# Patient Record
Sex: Female | Born: 1970 | Race: White | Hispanic: No | Marital: Married | Smoking: Former smoker
Health system: Southern US, Community
[De-identification: ages and names within clinical notes are randomized; demographics above are authoritative.]

## PROBLEM LIST (undated history)

## (undated) DIAGNOSIS — K219 Gastro-esophageal reflux disease without esophagitis: Secondary | ICD-10-CM

## (undated) DIAGNOSIS — M79673 Pain in unspecified foot: Secondary | ICD-10-CM

## (undated) DIAGNOSIS — D259 Leiomyoma of uterus, unspecified: Secondary | ICD-10-CM

## (undated) DIAGNOSIS — M545 Low back pain, unspecified: Secondary | ICD-10-CM

## (undated) DIAGNOSIS — E669 Obesity, unspecified: Secondary | ICD-10-CM

## (undated) DIAGNOSIS — J45909 Unspecified asthma, uncomplicated: Secondary | ICD-10-CM

## (undated) DIAGNOSIS — N946 Dysmenorrhea, unspecified: Secondary | ICD-10-CM

## (undated) DIAGNOSIS — R609 Edema, unspecified: Secondary | ICD-10-CM

## (undated) DIAGNOSIS — T4145XA Adverse effect of unspecified anesthetic, initial encounter: Secondary | ICD-10-CM

## (undated) DIAGNOSIS — I10 Essential (primary) hypertension: Secondary | ICD-10-CM

## (undated) DIAGNOSIS — E785 Hyperlipidemia, unspecified: Secondary | ICD-10-CM

## (undated) DIAGNOSIS — M1990 Unspecified osteoarthritis, unspecified site: Secondary | ICD-10-CM

## (undated) DIAGNOSIS — T7840XA Allergy, unspecified, initial encounter: Secondary | ICD-10-CM

## (undated) DIAGNOSIS — R251 Tremor, unspecified: Secondary | ICD-10-CM

## (undated) DIAGNOSIS — R519 Headache, unspecified: Secondary | ICD-10-CM

## (undated) DIAGNOSIS — G8929 Other chronic pain: Secondary | ICD-10-CM

## (undated) DIAGNOSIS — R51 Headache: Secondary | ICD-10-CM

## (undated) DIAGNOSIS — R7989 Other specified abnormal findings of blood chemistry: Secondary | ICD-10-CM

## (undated) DIAGNOSIS — L259 Unspecified contact dermatitis, unspecified cause: Secondary | ICD-10-CM

## (undated) DIAGNOSIS — N841 Polyp of cervix uteri: Secondary | ICD-10-CM

## (undated) DIAGNOSIS — N979 Female infertility, unspecified: Secondary | ICD-10-CM

## (undated) DIAGNOSIS — G629 Polyneuropathy, unspecified: Secondary | ICD-10-CM

## (undated) DIAGNOSIS — T8859XA Other complications of anesthesia, initial encounter: Secondary | ICD-10-CM

## (undated) DIAGNOSIS — E119 Type 2 diabetes mellitus without complications: Secondary | ICD-10-CM

## (undated) HISTORY — PX: OTHER SURGICAL HISTORY: SHX169

## (undated) HISTORY — DX: Leiomyoma of uterus, unspecified: D25.9

## (undated) HISTORY — DX: Other chronic pain: G89.29

## (undated) HISTORY — PX: OVARIAN CYST REMOVAL: SHX89

## (undated) HISTORY — DX: Other specified abnormal findings of blood chemistry: R79.89

## (undated) HISTORY — DX: Dysmenorrhea, unspecified: N94.6

## (undated) HISTORY — DX: Gastro-esophageal reflux disease without esophagitis: K21.9

## (undated) HISTORY — DX: Tremor, unspecified: R25.1

## (undated) HISTORY — DX: Hyperlipidemia, unspecified: E78.5

## (undated) HISTORY — DX: Obesity, unspecified: E66.9

## (undated) HISTORY — DX: Edema, unspecified: R60.9

## (undated) HISTORY — PX: APPENDECTOMY: SHX54

## (undated) HISTORY — DX: Polyp of cervix uteri: N84.1

## (undated) HISTORY — DX: Female infertility, unspecified: N97.9

## (undated) HISTORY — DX: Allergy, unspecified, initial encounter: T78.40XA

## (undated) HISTORY — DX: Low back pain, unspecified: M54.50

## (undated) HISTORY — PX: DILATION AND CURETTAGE OF UTERUS: SHX78

## (undated) HISTORY — PX: HYSTEROSCOPY: SHX211

## (undated) HISTORY — DX: Essential (primary) hypertension: I10

## (undated) HISTORY — DX: Unspecified contact dermatitis, unspecified cause: L25.9

## (undated) HISTORY — DX: Low back pain: M54.5

## (undated) HISTORY — DX: Pain in unspecified foot: M79.673

## (undated) HISTORY — PX: OOPHORECTOMY: SHX86

---

## 2004-01-26 ENCOUNTER — Ambulatory Visit: Payer: Self-pay

## 2005-04-02 ENCOUNTER — Ambulatory Visit: Payer: Self-pay | Admitting: Family Medicine

## 2005-04-11 ENCOUNTER — Ambulatory Visit: Payer: Self-pay | Admitting: Family Medicine

## 2007-12-04 ENCOUNTER — Ambulatory Visit: Payer: Self-pay | Admitting: Family Medicine

## 2008-04-16 ENCOUNTER — Ambulatory Visit: Payer: Self-pay | Admitting: Family Medicine

## 2010-10-13 ENCOUNTER — Ambulatory Visit: Payer: Self-pay | Admitting: Family Medicine

## 2010-10-20 ENCOUNTER — Ambulatory Visit: Payer: Self-pay

## 2012-11-20 ENCOUNTER — Ambulatory Visit: Payer: Self-pay | Admitting: Obstetrics and Gynecology

## 2012-11-20 LAB — COMPREHENSIVE METABOLIC PANEL
Albumin: 3.6 g/dL (ref 3.4–5.0)
Anion Gap: 5 — ABNORMAL LOW (ref 7–16)
BUN: 10 mg/dL (ref 7–18)
Co2: 26 mmol/L (ref 21–32)
Creatinine: 0.74 mg/dL (ref 0.60–1.30)
EGFR (African American): >60
EGFR (Non-African Amer.): >60
Potassium: 3.8 mmol/L (ref 3.5–5.1)
SGOT(AST): 20 U/L (ref 15–37)
Total Protein: 7.3 g/dL (ref 6.4–8.2)

## 2012-11-20 LAB — CBC
MCH: 25.9 pg — ABNORMAL LOW (ref 26.0–34.0)
MCHC: 33.8 g/dL (ref 32.0–36.0)
MCV: 77 fL — ABNORMAL LOW (ref 80–100)
Platelet: 291 10*3/uL (ref 150–440)
RBC: 4.99 10*6/uL (ref 3.80–5.20)
RDW: 14.6 % — ABNORMAL HIGH (ref 11.5–14.5)

## 2012-12-04 ENCOUNTER — Ambulatory Visit: Payer: Self-pay | Admitting: Obstetrics and Gynecology

## 2012-12-08 LAB — PATHOLOGY REPORT

## 2013-09-01 ENCOUNTER — Emergency Department: Payer: Self-pay | Admitting: Emergency Medicine

## 2013-09-01 LAB — CBC WITH DIFFERENTIAL/PLATELET
Basophil #: 0.1 10*3/uL (ref 0.0–0.1)
Basophil %: 0.7 %
Eosinophil #: 0.1 10*3/uL (ref 0.0–0.7)
Eosinophil %: 1.3 %
HCT: 38.9 % (ref 35.0–47.0)
HGB: 12.7 g/dL (ref 12.0–16.0)
LYMPHS ABS: 2.3 10*3/uL (ref 1.0–3.6)
Lymphocyte %: 25.2 %
MCH: 24.7 pg — ABNORMAL LOW (ref 26.0–34.0)
MCHC: 32.7 g/dL (ref 32.0–36.0)
MCV: 76 fL — ABNORMAL LOW (ref 80–100)
Monocyte #: 0.5 x10 3/mm (ref 0.2–0.9)
Monocyte %: 5.3 %
NEUTROS PCT: 67.5 %
Neutrophil #: 6.1 10*3/uL (ref 1.4–6.5)
Platelet: 299 10*3/uL (ref 150–440)
RBC: 5.16 10*6/uL (ref 3.80–5.20)
RDW: 14.8 % — AB (ref 11.5–14.5)
WBC: 9 10*3/uL (ref 3.6–11.0)

## 2013-09-01 LAB — TROPONIN I

## 2013-09-01 LAB — BASIC METABOLIC PANEL
ANION GAP: 9 (ref 7–16)
BUN: 11 mg/dL (ref 7–18)
CO2: 23 mmol/L (ref 21–32)
CREATININE: 1.01 mg/dL (ref 0.60–1.30)
Calcium, Total: 8.9 mg/dL (ref 8.5–10.1)
Chloride: 105 mmol/L (ref 98–107)
EGFR (African American): >60
EGFR (Non-African Amer.): >60
Glucose: 142 mg/dL — ABNORMAL HIGH (ref 65–99)
Osmolality: 276 (ref 275–301)
Potassium: 3.9 mmol/L (ref 3.5–5.1)
SODIUM: 137 mmol/L (ref 136–145)

## 2013-09-01 LAB — URINALYSIS, COMPLETE
BACTERIA: NONE SEEN
Bilirubin,UR: NEGATIVE
Glucose,UR: NEGATIVE mg/dL (ref 0–75)
Ketone: NEGATIVE
Leukocyte Esterase: NEGATIVE
NITRITE: NEGATIVE
PH: 5 (ref 4.5–8.0)
Protein: NEGATIVE
Specific Gravity: 1.025 (ref 1.003–1.030)
WBC UR: 3 /HPF (ref 0–5)

## 2013-11-25 LAB — HEMOGLOBIN A1C: HEMOGLOBIN A1C: 5.7 % (ref 4.0–6.0)

## 2013-12-01 ENCOUNTER — Emergency Department: Payer: Self-pay | Admitting: Internal Medicine

## 2013-12-01 LAB — COMPREHENSIVE METABOLIC PANEL
ALBUMIN: 3.7 g/dL (ref 3.4–5.0)
ALK PHOS: 70 U/L
ALT: 22 U/L
ANION GAP: 9 (ref 7–16)
AST: 21 U/L (ref 15–37)
BUN: 8 mg/dL (ref 7–18)
Bilirubin,Total: 0.4 mg/dL (ref 0.2–1.0)
Calcium, Total: 8.7 mg/dL (ref 8.5–10.1)
Chloride: 102 mmol/L (ref 98–107)
Co2: 26 mmol/L (ref 21–32)
Creatinine: 0.82 mg/dL (ref 0.60–1.30)
EGFR (African American): >60
EGFR (Non-African Amer.): >60
GLUCOSE: 100 mg/dL — AB (ref 65–99)
Osmolality: 272 (ref 275–301)
Potassium: 3.7 mmol/L (ref 3.5–5.1)
Sodium: 137 mmol/L (ref 136–145)
Total Protein: 7.6 g/dL (ref 6.4–8.2)

## 2013-12-01 LAB — URINALYSIS, COMPLETE
BLOOD: NEGATIVE
Bilirubin,UR: NEGATIVE
GLUCOSE, UR: NEGATIVE mg/dL (ref 0–75)
Ketone: NEGATIVE
Leukocyte Esterase: NEGATIVE
NITRITE: NEGATIVE
PROTEIN: NEGATIVE
Ph: 6 (ref 4.5–8.0)
RBC, UR: NONE SEEN /HPF (ref 0–5)
Specific Gravity: 1.004 (ref 1.003–1.030)
Squamous Epithelial: 1
WBC UR: <1 /HPF (ref 0–5)

## 2013-12-01 LAB — CBC WITH DIFFERENTIAL/PLATELET
BASOS ABS: 0.1 10*3/uL (ref 0.0–0.1)
Basophil %: 1.2 %
EOS ABS: 0.1 10*3/uL (ref 0.0–0.7)
Eosinophil %: 0.8 %
HCT: 39.7 % (ref 35.0–47.0)
HGB: 13 g/dL (ref 12.0–16.0)
LYMPHS PCT: 22.6 %
Lymphocyte #: 2.3 10*3/uL (ref 1.0–3.6)
MCH: 24.8 pg — ABNORMAL LOW (ref 26.0–34.0)
MCHC: 32.8 g/dL (ref 32.0–36.0)
MCV: 76 fL — AB (ref 80–100)
Monocyte #: 0.6 x10 3/mm (ref 0.2–0.9)
Monocyte %: 5.9 %
Neutrophil #: 7 10*3/uL — ABNORMAL HIGH (ref 1.4–6.5)
Neutrophil %: 69.5 %
Platelet: 299 10*3/uL (ref 150–440)
RBC: 5.25 10*6/uL — ABNORMAL HIGH (ref 3.80–5.20)
RDW: 14.6 % — AB (ref 11.5–14.5)
WBC: 10 10*3/uL (ref 3.6–11.0)

## 2013-12-01 LAB — LIPASE, BLOOD: LIPASE: 123 U/L (ref 73–393)

## 2014-04-15 LAB — HM PAP SMEAR: HM Pap smear: NORMAL

## 2014-06-25 NOTE — Op Note (Signed)
PATIENT NAME:  Kelsey Alvarez, Kelsey Alvarez MR#:  [id number] DATE OF BIRTH:  07-Dec-1970  DATE OF PROCEDURE:  12/04/2012  PREOPERATIVE DIAGNOSES: 1.  Abnormal uterine bleeding.  2.  Suspected polyp.   POSTOPERATIVE DIAGNOSES: 1.  Abnormal uterine bleeding.  2.  Suspected polyp.   PROCEDURES: 1.  Dilation and curettage.  2.  Hysteroscopy.  3.  Polypectomy.   ANESTHESIA: General, MAC.   SURGEON: Prentice Docker, M.D.   ESTIMATED BLOOD LOSS: 25 mL.   OPERATIVE FLUIDS: 1500 mL crystalloid.   COMPLICATIONS: None.   FINDINGS: 1.  Shaggy endometrium.  2.  Several polypoid lesions.   SPECIMENS: Endometrial curettings, with suspected polyps.   CONDITION: Stable at the end of the procedure.   PROCEDURE IN DETAIL: The patient was taken to the operating room where general MAC anesthesia was administered and found to be adequate. The patient was placed in the dorsal supine high-lithotomy position and prepped and draped in the usual sterile fashion. After a time-out was called, a red rubber catheter was placed through the catheter for in-and-out straight catheterization for return of approximately 50 mL clear urine. A sterile speculum was placed in the vagina and the posterior lip of the cervix was grasped with a single-toothed tenaculum, given her retroverted uterus. The uterus was then sounded to a depth of approximately 9 cm. The cervix was then dilated gently in a serial fashion using Hagar dilators to a dilatation of 9 mm.   Next, the hysteroscope was gently introduced through the cervix, with the above-noted findings. The hysteroscope was removed and gentle curettage was performed until a gritty texture was noted throughout. The hysteroscope was reintroduced. There was a single polypoid lesion remaining. The Randall stone forceps were then introduced through the cervix gently to attempt to remove the polyp and the lesion, and this was followed by the curette with a single pass in a 360-degree manner  around the uterus, including the fundus. The camera was reintroduced, and there was a minimal amount of shaggy features of the uterus at this point, and all polypoid lesions had been removed. At this point the procedure was terminated. The scope was gently removed. The tenaculum was removed and hemostasis was noted on the posterior lip of the cervix. All instrumentation from the vagina was removed after verification that the vagina was free of instruments or sponges.   The patient tolerated the procedure well. All instruments counts were correct x 2. For VTE prophylaxis, the patient was wearing pneumatic compression stockings throughout the entire procedure. She was awakened in the operating room and taken to the recovery area in stable condition.     ____________________________ Will Bonnet, MD sdj:dm D: 12/04/2012 14:54:10 ET T: 12/04/2012 15:55:15 ET JOB#: [id number]  cc: Will Bonnet, MD, <Dictator> Will Bonnet MD ELECTRONICALLY SIGNED 12/23/2012 15:16

## 2014-11-16 ENCOUNTER — Encounter: Payer: Self-pay | Admitting: *Deleted

## 2014-11-16 ENCOUNTER — Emergency Department: Payer: Managed Care, Other (non HMO)

## 2014-11-16 DIAGNOSIS — S93402A Sprain of unspecified ligament of left ankle, initial encounter: Secondary | ICD-10-CM | POA: Diagnosis not present

## 2014-11-16 DIAGNOSIS — Y998 Other external cause status: Secondary | ICD-10-CM | POA: Insufficient documentation

## 2014-11-16 DIAGNOSIS — X58XXXA Exposure to other specified factors, initial encounter: Secondary | ICD-10-CM | POA: Insufficient documentation

## 2014-11-16 DIAGNOSIS — Z880 Allergy status to penicillin: Secondary | ICD-10-CM | POA: Diagnosis not present

## 2014-11-16 DIAGNOSIS — E119 Type 2 diabetes mellitus without complications: Secondary | ICD-10-CM | POA: Insufficient documentation

## 2014-11-16 DIAGNOSIS — Y9289 Other specified places as the place of occurrence of the external cause: Secondary | ICD-10-CM | POA: Diagnosis not present

## 2014-11-16 DIAGNOSIS — Y9389 Activity, other specified: Secondary | ICD-10-CM | POA: Insufficient documentation

## 2014-11-16 DIAGNOSIS — S99912A Unspecified injury of left ankle, initial encounter: Secondary | ICD-10-CM | POA: Diagnosis present

## 2014-11-16 NOTE — ED Notes (Signed)
Pt states that she stepped down out of a truck over the weekend and has been having pain and swelling in her left ankle

## 2014-11-17 ENCOUNTER — Emergency Department
Admission: EM | Admit: 2014-11-17 | Discharge: 2014-11-17 | Disposition: A | Discharge status: Home / Self Care | Payer: Managed Care, Other (non HMO) | Attending: Emergency Medicine | Admitting: Emergency Medicine

## 2014-11-17 DIAGNOSIS — S93402A Sprain of unspecified ligament of left ankle, initial encounter: Secondary | ICD-10-CM

## 2014-11-17 HISTORY — DX: Type 2 diabetes mellitus without complications: E11.9

## 2014-11-17 HISTORY — DX: Unspecified asthma, uncomplicated: J45.909

## 2014-11-17 MED ORDER — OXYCODONE-ACETAMINOPHEN 5-325 MG PO TABS: 1.0000 | ORAL_TABLET | Freq: Once | ORAL | Status: DC

## 2014-11-17 MED ORDER — OXYCODONE-ACETAMINOPHEN 5-325 MG PO TABS
ORAL_TABLET | ORAL | Status: AC
  Administered 2014-11-17: 1 via ORAL
  Filled 2014-11-17: qty 1

## 2014-11-17 MED ORDER — OXYCODONE-ACETAMINOPHEN 5-325 MG PO TABS
1.0000 | ORAL_TABLET | Freq: Once | ORAL | Status: AC
  Administered 2014-11-17: 1 via ORAL

## 2014-11-17 NOTE — Discharge Instructions (Signed)

## 2014-11-17 NOTE — ED Provider Notes (Signed)
[facility] Emergency Department Provider Note  ____________________________________________  Time seen: 12:10 AM  I have reviewed the triage vital signs and the nursing notes.   HISTORY  Chief Complaint Ankle Pain      HPI Kelsey Alvarez is a 44 y.o. female presents with history of injuring her left ankle 2 days ago when she stepped out of a truck patient said she rolled her ankle and has had pain and swelling in since that time. Patient states current pain score is 8 out of 10 located primarily around the lateral worsening of the left ankle. Patient states pain is aggravated with movement and ambulation. Patient states that pain is alleviated with ice and rest and elevation.    Past Medical History  Diagnosis Date  . Diabetes mellitus without complication   . Asthma     There are no active problems to display for this patient.   No past surgical history on file.  No current outpatient prescriptions on file.  Allergies Penicillins  No family history on file.  Social History Social History  Substance Use Topics  . Smoking status: Never Smoker   . Smokeless tobacco: None  . Alcohol Use: No    Review of Systems  Constitutional: Negative for fever. Eyes: Negative for visual changes. ENT: Negative for sore throat. Cardiovascular: Negative for chest pain. Respiratory: Negative for shortness of breath. Gastrointestinal: Negative for abdominal pain, vomiting and diarrhea. Genitourinary: Negative for dysuria. Musculoskeletal: Negative for back pain.Positive for left ankle pain Skin: Negative for rash. Neurological: Negative for headaches, focal weakness or numbness.   10-point ROS otherwise negative.  ____________________________________________   PHYSICAL EXAM:  VITAL SIGNS: ED Triage Vitals  Enc Vitals Group     BP 11/16/14 2114 171/97 mmHg     Pulse Rate 11/16/14 2114 71     Resp 11/16/14 2114 16     Temp 11/16/14 2114 98.2  F (36.8 C)     Temp Source 11/16/14 2114 Oral     SpO2 11/16/14 2114 100 %     Weight 11/16/14 2114 243 lb (110.224 kg)     Height 11/16/14 2114 5\' 1"  (1.549 m)     Head Cir --      Peak Flow --      Pain Score 11/16/14 2115 7     Pain Loc --      Pain Edu? --      Excl. in [place]? --      Constitutional: Alert and oriented. Well appearing and in no distress. Eyes: Conjunctivae are normal. PERRL. Normal extraocular movements. ENT   Head: Normocephalic and atraumatic.   Nose: No congestion/rhinnorhea.   Mouth/Throat: Mucous membranes are moist.   Neck: No stridor. Musculoskeletal: Nontender with normal range of motion in all extremities. No joint effusions. Pain and swelling left lateral malleoli Skin:  Skin is warm, dry and intact. No rash noted. Psychiatric: Mood and affect are normal. Speech and behavior are normal. Patient exhibits appropriate insight and judgment.      RADIOLOGY DG Ankle Complete Left (Final result) Result time: 11/16/14 21:56:31   Final result by Rad Results In Interface (11/16/14 21:56:31)   Narrative:   CLINICAL DATA: Left ankle pain and swelling. Stepped down out of a truck over the weekend, pain and swelling since that time.  EXAM: LEFT ANKLE COMPLETE - 3+ VIEW  COMPARISON: None.  FINDINGS: No fracture or dislocation. The alignment and joint spaces are maintained. The ankle mortise is preserved. Prominent plantar calcaneal  spur and Achilles tendon enthesophyte. No localizing soft tissue abnormality or radiopaque foreign body.  IMPRESSION: No fracture or dislocation of the left ankle.   Electronically Signed By: Jeb Levering M.D. On: 11/16/2014 21:56          INITIAL IMPRESSION / ASSESSMENT AND PLAN / ED COURSE  Pertinent labs & imaging results that were available during my care of the patient were reviewed by me and considered in my medical decision making (see chart for details).  Velcro splint  applied, crutches given  ____________________________________________   FINAL CLINICAL IMPRESSION(S) / ED DIAGNOSES  Final diagnoses:  None      Gregor Hams, MD 11/17/14 [phone]

## 2014-11-26 ENCOUNTER — Other Ambulatory Visit: Payer: Self-pay

## 2014-11-26 NOTE — Telephone Encounter (Signed)
Patient requesting refill. 

## 2014-11-30 ENCOUNTER — Other Ambulatory Visit: Payer: Self-pay

## 2014-12-03 ENCOUNTER — Other Ambulatory Visit: Payer: Self-pay

## 2014-12-06 ENCOUNTER — Telehealth: Payer: Self-pay

## 2014-12-06 NOTE — Telephone Encounter (Signed)
Patient needs an appointment before she can refill medication

## 2014-12-06 NOTE — Telephone Encounter (Signed)
LVM for patient to return call. 

## 2014-12-09 ENCOUNTER — Other Ambulatory Visit: Payer: Self-pay | Admitting: Family Medicine

## 2014-12-10 ENCOUNTER — Other Ambulatory Visit: Payer: Self-pay

## 2014-12-10 NOTE — Telephone Encounter (Signed)
Refill meds

## 2014-12-14 ENCOUNTER — Telehealth: Payer: Self-pay

## 2014-12-14 NOTE — Telephone Encounter (Signed)
Left voice message to schedule appointment.

## 2014-12-14 NOTE — Telephone Encounter (Signed)
Pt needs appointment before meds can be refilled

## 2014-12-20 ENCOUNTER — Other Ambulatory Visit: Payer: Self-pay

## 2014-12-20 NOTE — Telephone Encounter (Signed)
Has an appt on 10/25

## 2014-12-20 NOTE — Telephone Encounter (Signed)
How many times day?

## 2014-12-21 ENCOUNTER — Other Ambulatory Visit: Payer: Self-pay

## 2014-12-21 MED ORDER — LABETALOL HCL 200 MG PO TABS: 200.0000 mg | ORAL_TABLET | Freq: Two times a day (BID) | ORAL | Status: DC

## 2014-12-28 ENCOUNTER — Ambulatory Visit (INDEPENDENT_AMBULATORY_CARE_PROVIDER_SITE_OTHER): Payer: Managed Care, Other (non HMO) | Admitting: Family Medicine

## 2014-12-28 ENCOUNTER — Encounter: Payer: Self-pay | Admitting: Family Medicine

## 2014-12-28 ENCOUNTER — Encounter (INDEPENDENT_AMBULATORY_CARE_PROVIDER_SITE_OTHER): Payer: Self-pay

## 2014-12-28 VITALS — BP 124/84 | HR 72 | Temp 98.0°F | Resp 16 | Ht 62.0 in | Wt 258.0 lb

## 2014-12-28 DIAGNOSIS — R609 Edema, unspecified: Secondary | ICD-10-CM | POA: Insufficient documentation

## 2014-12-28 DIAGNOSIS — E119 Type 2 diabetes mellitus without complications: Secondary | ICD-10-CM

## 2014-12-28 DIAGNOSIS — M545 Low back pain, unspecified: Secondary | ICD-10-CM | POA: Insufficient documentation

## 2014-12-28 DIAGNOSIS — J3089 Other allergic rhinitis: Secondary | ICD-10-CM

## 2014-12-28 DIAGNOSIS — E559 Vitamin D deficiency, unspecified: Secondary | ICD-10-CM | POA: Insufficient documentation

## 2014-12-28 DIAGNOSIS — J309 Allergic rhinitis, unspecified: Secondary | ICD-10-CM

## 2014-12-28 DIAGNOSIS — N979 Female infertility, unspecified: Secondary | ICD-10-CM | POA: Insufficient documentation

## 2014-12-28 DIAGNOSIS — Z23 Encounter for immunization: Secondary | ICD-10-CM | POA: Diagnosis not present

## 2014-12-28 DIAGNOSIS — M5432 Sciatica, left side: Secondary | ICD-10-CM | POA: Insufficient documentation

## 2014-12-28 DIAGNOSIS — M79605 Pain in left leg: Secondary | ICD-10-CM

## 2014-12-28 DIAGNOSIS — I10 Essential (primary) hypertension: Secondary | ICD-10-CM | POA: Diagnosis not present

## 2014-12-28 DIAGNOSIS — N946 Dysmenorrhea, unspecified: Secondary | ICD-10-CM | POA: Insufficient documentation

## 2014-12-28 DIAGNOSIS — E785 Hyperlipidemia, unspecified: Secondary | ICD-10-CM | POA: Diagnosis not present

## 2014-12-28 DIAGNOSIS — R251 Tremor, unspecified: Secondary | ICD-10-CM | POA: Insufficient documentation

## 2014-12-28 DIAGNOSIS — J4540 Moderate persistent asthma, uncomplicated: Secondary | ICD-10-CM | POA: Diagnosis not present

## 2014-12-28 DIAGNOSIS — N841 Polyp of cervix uteri: Secondary | ICD-10-CM | POA: Insufficient documentation

## 2014-12-28 DIAGNOSIS — E1129 Type 2 diabetes mellitus with other diabetic kidney complication: Secondary | ICD-10-CM | POA: Insufficient documentation

## 2014-12-28 DIAGNOSIS — N921 Excessive and frequent menstruation with irregular cycle: Secondary | ICD-10-CM | POA: Insufficient documentation

## 2014-12-28 DIAGNOSIS — K219 Gastro-esophageal reflux disease without esophagitis: Secondary | ICD-10-CM | POA: Insufficient documentation

## 2014-12-28 DIAGNOSIS — R809 Proteinuria, unspecified: Secondary | ICD-10-CM

## 2014-12-28 MED ORDER — METFORMIN HCL 500 MG PO TABS: 500.0000 mg | ORAL_TABLET | Freq: Every evening | ORAL | Status: DC

## 2014-12-28 MED ORDER — HYDROCHLOROTHIAZIDE 25 MG PO TABS: 25.0000 mg | ORAL_TABLET | Freq: Every day | ORAL | Status: DC

## 2014-12-28 MED ORDER — LABETALOL HCL 200 MG PO TABS: 200.0000 mg | ORAL_TABLET | Freq: Two times a day (BID) | ORAL | Status: DC

## 2014-12-28 MED ORDER — MONTELUKAST SODIUM 10 MG PO TABS: 10.0000 mg | ORAL_TABLET | Freq: Every day | ORAL | Status: DC

## 2014-12-28 MED ORDER — PREDNISONE 10 MG (48) PO TBPK: ORAL_TABLET | Freq: Every day | ORAL | Status: DC

## 2014-12-28 MED ORDER — BUDESONIDE-FORMOTEROL FUMARATE 160-4.5 MCG/ACT IN AERO: 2.0000 | INHALATION_SPRAY | Freq: Two times a day (BID) | RESPIRATORY_TRACT | Status: DC

## 2014-12-28 MED ORDER — ALBUTEROL SULFATE HFA 108 (90 BASE) MCG/ACT IN AERS: 2.0000 | INHALATION_SPRAY | RESPIRATORY_TRACT | Status: DC | PRN

## 2014-12-28 MED ORDER — FLUTICASONE PROPIONATE 50 MCG/ACT NA SUSP: 2.0000 | Freq: Every day | NASAL | Status: DC

## 2014-12-28 NOTE — Progress Notes (Signed)
   12/28/14 1056  Asthma History  Symptoms Daily  Nighttime Awakenings Often--7/wk  Asthma interference with normal activity Some limitations  SABA use (not for EIB) > 2 days/wk--not > 1 x/day  Risk: Exacerbations requiring oral systemic steroids 0-1 / year  Asthma Severity Moderate Persistent

## 2014-12-28 NOTE — Progress Notes (Signed)
Name: Kelsey Alvarez   MRN: [id number]    DOB: 1970-10-31   Date:12/28/2014       Progress Note  Subjective  Chief Complaint  Chief Complaint  Patient presents with  . Medication Refill    follow-up  . Hypertension    a little dizziness, but has also had vertigo  . Asthma    chest tightness  . Diabetes    checks BG 2x weekly low-96, high-190's    HPI  HTN: she has been taking Labetolol and HCTZ ( sometimes skips when working because of urinary frequency ) She gets a little dizzy when she gets up quickly. Advised to stay hydrated and get up slowly. Occasionally has palpitation, occasional right side chest pain  Asthma:   12/28/14 1056  Asthma History  Symptoms Daily  Nighttime Awakenings Often--7/wk  Asthma interference with normal activity Some limitations  SABA use (not for EIB) > 2 days/wk--not > 1 x/day  Risk: Exacerbations requiring oral systemic steroids 0-1 / year  Asthma Severity Moderate Persistent   Only on Singulair and prn Albuterol, symptoms are not controlled and we will add Symbicort  Hyperlipidemia: off medication, states medications caused a headache in the past  Morbid Obesity: still gaining weight, still drinking sodas but is cutting down, also eating more grilled meals. Making some dieatery  Modification  Left Leg pain: she states that she developed left leg pain about 7 months ago, without any injury. States pain goes from buttocks down the entire left leg, no rashes, feels like a throbbing, aching pain, sometimes shooting, sometimes causes weakness. Seen by [facility] and diagnosed with sciatica, and given pain medication. She states stretching seems to help  DM: glucose at home has been at goal - this am at 121 , no polyphagia, polyuria or polydipsia, due for labs and eye exam. Taking Metformin and denies side effects.   Patient Active Problem List   Diagnosis Date Noted  . Benign essential HTN 12/28/2014  . Cervical polyp 12/28/2014  . Controlled type 2  diabetes mellitus without complication ([place]) 78/29/5621  . Dyslipidemia 12/28/2014  . Dysmenorrhea 12/28/2014  . Edema 12/28/2014  . Female infertility 12/28/2014  . Gastro-esophageal reflux disease without esophagitis 12/28/2014  . Morbid obesity ([place]) 12/28/2014  . Asthma, moderate persistent, poorly-controlled 12/28/2014  . Perennial allergic rhinitis 12/28/2014  . Intermittent tremor 12/28/2014  . Vitamin D deficiency 12/28/2014  . LBP (low back pain) 12/28/2014  . Left sciatic nerve pain 12/28/2014    Past Surgical History  Procedure Laterality Date  . Appendectomy    . Cervical polyp removal    . Ovarian cyst removal    . Hysteroscopy    . Dilation and curettage of uterus      Family History  Problem Relation Age of Onset  . Hypertension Mother   . Diabetes Mother   . CAD Mother   . Cancer Mother     cervical  . CAD Father   . Hypertension Father   . Arthritis Father     Social History   Social History  . Marital Status: Married    Spouse Name: N/A  . Number of Children: N/A  . Years of Education: N/A   Occupational History  . Not on file.   Social History Main Topics  . Smoking status: Former Smoker -- .5 years    Quit date: 12/28/2002  . Smokeless tobacco: Never Used  . Alcohol Use: 0.0 oz/week    0 Standard drinks or equivalent per week  .  Drug Use: No  . Sexual Activity: Yes   Other Topics Concern  . Not on file   Social History Narrative     Current outpatient prescriptions:  .  albuterol (ACCUNEB) 0.63 MG/3ML nebulizer solution, Inhale 1 mL into the lungs as needed., Disp: , Rfl:  .  albuterol (VENTOLIN HFA) 108 (90 BASE) MCG/ACT inhaler, Inhale 2 puffs into the lungs every 4 (four) hours as needed., Disp: 1 Inhaler, Rfl: 1 .  Cholecalciferol (VITAMIN D) 2000 UNITS tablet, Take 1 tablet by mouth daily., Disp: , Rfl:  .  fluticasone (FLONASE) 50 MCG/ACT nasal spray, Place 2 sprays into both nostrils daily., Disp: 16 g, Rfl: 5 .   budesonide-formoterol (SYMBICORT) 160-4.5 MCG/ACT inhaler, Inhale 2 puffs into the lungs 2 (two) times daily., Disp: 1 Inhaler, Rfl: 2 .  hydrochlorothiazide (HYDRODIURIL) 25 MG tablet, Take 1 tablet (25 mg total) by mouth daily., Disp: 90 tablet, Rfl: 1 .  labetalol (NORMODYNE) 200 MG tablet, Take 1 tablet (200 mg total) by mouth 2 (two) times daily., Disp: 180 tablet, Rfl: 1 .  metFORMIN (GLUCOPHAGE) 500 MG tablet, Take 1 tablet (500 mg total) by mouth every evening., Disp: 90 tablet, Rfl: 1 .  montelukast (SINGULAIR) 10 MG tablet, Take 1 tablet (10 mg total) by mouth at bedtime., Disp: 90 tablet, Rfl: 1 .  predniSONE (STERAPRED UNI-PAK 48 TAB) 10 MG (48) TBPK tablet, Take by mouth daily. Prednisone 12 day taper, Disp: 48 tablet, Rfl: 0 .  traMADol (ULTRAM) 50 MG tablet, , Disp: , Rfl:   Allergies  Allergen Reactions  . Penicillins Rash     ROS  Constitutional: Negative for fever with some weight change.  Respiratory: Positive  for cough and shortness of breath.   Cardiovascular: Negative for chest pain or palpitations.  Gastrointestinal: Negative for abdominal pain, no bowel changes.  Musculoskeletal: Positive for gait problem and some left knee  joint swelling.  Skin: Negative for rash.  Neurological: Positive  for dizziness but no  headache.  No other specific complaints in a complete review of systems (except as listed in HPI above).  Objective  Filed Vitals:   12/28/14 1010  BP: 124/84  Pulse: 72  Temp: 98 F (36.7 C)  TempSrc: Oral  Resp: 16  Height: 5\' 2"  (1.575 m)  Weight: 258 lb (117.028 kg)  SpO2: 97%    Body mass index is 47.18 kg/(m^2).  Physical Exam  Constitutional: Patient appears well-developed and well-nourished. Obese No distress.  HEENT: head atraumatic, normocephalic, pupils equal and reactive to light, neck supple, throat within normal limits Cardiovascular: Normal rate, regular rhythm and normal heart sounds.  No murmur heard. No BLE  edema. Pulmonary/Chest: Effort normal and breath sounds normal. No respiratory distress. Abdominal: Soft.  There is no tenderness. Psychiatric: Patient has a normal mood and affect. behavior is normal. Judgment and thought content normal. Muscular Skeletal: left low back pain during palpation, negative straight leg raise, pain with extension is on anterior knee, positive pain with abduction of left hip.   No results found for this or any previous visit (from the past 2160 hour(s)).  Diabetic Foot Exam: Diabetic Foot Exam - Simple   Simple Foot Form  Visual Inspection  No deformities, no ulcerations, no other skin breakdown bilaterally:  Yes  Sensation Testing  Intact to touch and monofilament testing bilaterally:  Yes  Pulse Check  Posterior Tibialis and Dorsalis pulse intact bilaterally:  Yes  Comments      PHQ2/9: Depression screen PHQ  2/9 12/28/2014  Decreased Interest 0  Down, Depressed, Hopeless 0  PHQ - 2 Score 0    Fall Risk: Fall Risk  12/28/2014  Falls in the past year? No    Functional Status Survey: Is the patient deaf or have difficulty hearing?: No Does the patient have difficulty seeing, even when wearing glasses/contacts?: Yes (glasses) Does the patient have difficulty concentrating, remembering, or making decisions?: No Does the patient have difficulty walking or climbing stairs?: No Does the patient have difficulty dressing or bathing?: No Does the patient have difficulty doing errands alone such as visiting a doctor's office or shopping?: No    Assessment & Plan  1. Type 2 diabetes mellitus without complication, without long-term current use of insulin (HCC)  Glucose will go up with prednisone, needs to follow diabetic diet and continue Metformin - POCT UA - Microalbumin - metFORMIN (GLUCOPHAGE) 500 MG tablet; Take 1 tablet (500 mg total) by mouth every evening.  Dispense: 90 tablet; Refill: 1 - Hemoglobin A1c  2. Needs flu shot  - Flu Vaccine  QUAD 36+ mos PF IM (Fluarix & Fluzone Quad PF)  3. Morbid obesity due to excess calories [facility])  Discussed with the patient the risk posed by an increased BMI. Discussed importance of portion control, calorie counting and at least 150 minutes of physical activity weekly. Avoid sweet beverages and drink more water. Eat at least 6 servings of fruit and vegetables daily   4. Dyslipidemia  Off medication, causes headache - Lipid panel  5. Uncontrolled moderate persistent asthma  Resume Symbicort, daily symptoms - montelukast (SINGULAIR) 10 MG tablet; Take 1 tablet (10 mg total) by mouth at bedtime.  Dispense: 90 tablet; Refill: 1 - albuterol (VENTOLIN HFA) 108 (90 BASE) MCG/ACT inhaler; Inhale 2 puffs into the lungs every 4 (four) hours as needed.  Dispense: 1 Inhaler; Refill: 1 - budesonide-formoterol (SYMBICORT) 160-4.5 MCG/ACT inhaler; Inhale 2 puffs into the lungs 2 (two) times daily.  Dispense: 1 Inhaler; Refill: 2  6. Benign essential HTN  - labetalol (NORMODYNE) 200 MG tablet; Take 1 tablet (200 mg total) by mouth 2 (two) times daily.  Dispense: 180 tablet; Refill: 1 - hydrochlorothiazide (HYDRODIURIL) 25 MG tablet; Take 1 tablet (25 mg total) by mouth daily.  Dispense: 90 tablet; Refill: 1 - Comprehensive metabolic panel  7. Leg pain, left  Differential diagnosis sciatica, hip OA, we will try prednisone and if no improvement referral to Ortho - predniSONE (STERAPRED UNI-PAK 48 TAB) 10 MG (48) TBPK tablet; Take by mouth daily. Prednisone 12 day taper  Dispense: 48 tablet; Refill: 0  8. Perennial allergic rhinitis  Continue medication   9. Need for pneumococcal vaccination  - Pneumococcal conjugate vaccine 13-valent IM

## 2015-03-30 ENCOUNTER — Encounter: Payer: Self-pay | Admitting: Family Medicine

## 2015-03-30 ENCOUNTER — Ambulatory Visit
Admission: RE | Admit: 2015-03-30 | Discharge: 2015-03-30 | Disposition: A | Discharge status: Home / Self Care | Payer: 59 | Source: Ambulatory Visit | Attending: Family Medicine | Admitting: Family Medicine

## 2015-03-30 ENCOUNTER — Other Ambulatory Visit: Payer: Self-pay

## 2015-03-30 ENCOUNTER — Ambulatory Visit (INDEPENDENT_AMBULATORY_CARE_PROVIDER_SITE_OTHER): Payer: 59 | Admitting: Family Medicine

## 2015-03-30 ENCOUNTER — Other Ambulatory Visit
Admission: RE | Admit: 2015-03-30 | Discharge: 2015-03-30 | Disposition: A | Discharge status: Home / Self Care | Payer: 59 | Source: Ambulatory Visit | Attending: Family Medicine | Admitting: Family Medicine

## 2015-03-30 VITALS — BP 128/86 | HR 63 | Temp 98.4°F | Resp 12 | Ht 62.0 in | Wt 258.0 lb

## 2015-03-30 DIAGNOSIS — E119 Type 2 diabetes mellitus without complications: Secondary | ICD-10-CM | POA: Diagnosis not present

## 2015-03-30 DIAGNOSIS — I10 Essential (primary) hypertension: Secondary | ICD-10-CM | POA: Insufficient documentation

## 2015-03-30 DIAGNOSIS — J309 Allergic rhinitis, unspecified: Secondary | ICD-10-CM | POA: Diagnosis not present

## 2015-03-30 DIAGNOSIS — E785 Hyperlipidemia, unspecified: Secondary | ICD-10-CM | POA: Diagnosis not present

## 2015-03-30 DIAGNOSIS — M25562 Pain in left knee: Secondary | ICD-10-CM | POA: Insufficient documentation

## 2015-03-30 DIAGNOSIS — R198 Other specified symptoms and signs involving the digestive system and abdomen: Secondary | ICD-10-CM

## 2015-03-30 DIAGNOSIS — R194 Change in bowel habit: Secondary | ICD-10-CM

## 2015-03-30 DIAGNOSIS — R1032 Left lower quadrant pain: Secondary | ICD-10-CM | POA: Diagnosis not present

## 2015-03-30 DIAGNOSIS — J4540 Moderate persistent asthma, uncomplicated: Secondary | ICD-10-CM | POA: Diagnosis not present

## 2015-03-30 DIAGNOSIS — Z8742 Personal history of other diseases of the female genital tract: Secondary | ICD-10-CM | POA: Diagnosis not present

## 2015-03-30 DIAGNOSIS — Z3202 Encounter for pregnancy test, result negative: Secondary | ICD-10-CM

## 2015-03-30 DIAGNOSIS — J3089 Other allergic rhinitis: Secondary | ICD-10-CM

## 2015-03-30 LAB — COMPREHENSIVE METABOLIC PANEL
ALBUMIN: 4 g/dL (ref 3.5–5.0)
ALK PHOS: 55 U/L (ref 38–126)
ALT: 15 U/L (ref 14–54)
AST: 15 U/L (ref 15–41)
Anion gap: 10 (ref 5–15)
BILIRUBIN TOTAL: 0.8 mg/dL (ref 0.3–1.2)
BUN: 9 mg/dL (ref 6–20)
CALCIUM: 8.9 mg/dL (ref 8.9–10.3)
CO2: 25 mmol/L (ref 22–32)
CREATININE: 0.69 mg/dL (ref 0.44–1.00)
Chloride: 104 mmol/L (ref 101–111)
GFR calc Af Amer: >60 mL/min (ref >60)
GFR calc non Af Amer: >60 mL/min (ref >60)
GLUCOSE: 107 mg/dL — AB (ref 65–99)
Potassium: 4.2 mmol/L (ref 3.5–5.1)
Sodium: 139 mmol/L (ref 135–145)
TOTAL PROTEIN: 7.3 g/dL (ref 6.5–8.1)

## 2015-03-30 LAB — POCT URINE PREGNANCY: Preg Test, Ur: NEGATIVE

## 2015-03-30 LAB — LIPID PANEL
CHOLESTEROL: 217 mg/dL — AB (ref 0–200)
HDL: 43 mg/dL (ref >40)
LDL Cholesterol: 146 mg/dL — ABNORMAL HIGH (ref 0–99)
TRIGLYCERIDES: 139 mg/dL (ref <150)
Total CHOL/HDL Ratio: 5 RATIO
VLDL: 28 mg/dL (ref 0–40)

## 2015-03-30 LAB — HEMOGLOBIN A1C: Hgb A1c MFr Bld: 6 % (ref 4.0–6.0)

## 2015-03-30 MED ORDER — BUDESONIDE-FORMOTEROL FUMARATE 160-4.5 MCG/ACT IN AERO: 2.0000 | INHALATION_SPRAY | Freq: Two times a day (BID) | RESPIRATORY_TRACT | Status: DC

## 2015-03-30 MED ORDER — ALBUTEROL SULFATE HFA 108 (90 BASE) MCG/ACT IN AERS: 2.0000 | INHALATION_SPRAY | RESPIRATORY_TRACT | Status: DC | PRN

## 2015-03-30 MED ORDER — ALBUTEROL SULFATE HFA 108 (90 BASE) MCG/ACT IN AERS: 2.0000 | INHALATION_SPRAY | Freq: Four times a day (QID) | RESPIRATORY_TRACT | Status: DC | PRN

## 2015-03-30 MED ORDER — ALBUTEROL SULFATE 0.63 MG/3ML IN NEBU: 1.0000 mL | INHALATION_SOLUTION | Freq: Four times a day (QID) | RESPIRATORY_TRACT | Status: DC | PRN

## 2015-03-30 MED ORDER — MOMETASONE FURO-FORMOTEROL FUM 100-5 MCG/ACT IN AERO: 2.0000 | INHALATION_SPRAY | Freq: Two times a day (BID) | RESPIRATORY_TRACT | Status: DC

## 2015-03-30 NOTE — Progress Notes (Signed)
Name: Kelsey Alvarez   MRN: [id number]    DOB: Jun 24, 1970   Date:03/30/2015       Progress Note  Subjective  Chief Complaint  Chief Complaint  Patient presents with  . Diabetes    patient has been checking her blood sugar a couple of times/ week. highest: 146 & lowest 62.  . Leg Pain    patient has been having some left leg pain (predominantly near her knee but it radiates near the hip) for about 68-months  . Medication Refill    inhaler    HPI  Hyperlipidemia: off medication, states medications caused a headache in the past  Morbid Obesity: weight has been stable, she is still drinking sodas, at least 30 ounces per day, but she has been drinking more water.  She has been eating 5 servings of fruit and vegetables per day, baking more. Did not gain weight through the holidays.   Left Leg pain: she states that she developed left leg pain about 10 months ago, without any injury. States pain goes from buttocks down the entire left leg, no rashes, feels like a throbbing, aching pain, sometimes shooting, sometimes causes weakness. Seen by [facility] and diagnosed with sciatica, and given pain medication. She states stretching seems to help. She was given taper three months ago, pain decreased significantly.  Left knee pain: she has noticed left knee pain since she finished prednisone taper three months ago. Pain is described as aching, constant and worse with activity, at times the pain can be severe and shooting. She states at times she has medial knee swelling, no redness. She has been taking Aleve with some improvement of symptoms. Tylenol does not seem to work   LLQ pain: symptoms started intermittently for many months. She states her bowel movements has changed from periods of  constipation and at times  diarrhea, associated with episodes of severe sharp pain that makes her cry usually after bowel movements, the pain can last a couple of hours. She takes Aleve and goes to bed to take a nap and she  feels better. No blood in stools, no mucus. The pain occasionally radiates to her back.   DM: glucose at home has been between 62-146, usually around 116.  No polyphagia, polyuria or polydipsia, due for labs and eye exam. Taking Metformin and denies side effects. She has occasional hypoglycemia at work because sometimes she can't take a break.   Asthma Moderate persistent: symptoms are better controlled since resumed Symbicort 3 months ago, she has mild cough when she goes to bed, but currently no wheezing or SOB.     Patient Active Problem List   Diagnosis Date Noted  . Benign essential HTN 12/28/2014  . Cervical polyp 12/28/2014  . Controlled type 2 diabetes mellitus without complication ([place]) 123XX123  . Dyslipidemia 12/28/2014  . Dysmenorrhea 12/28/2014  . Edema 12/28/2014  . Female infertility 12/28/2014  . Gastro-esophageal reflux disease without esophagitis 12/28/2014  . Morbid obesity ([place]) 12/28/2014  . Asthma, moderate persistent, poorly-controlled 12/28/2014  . Perennial allergic rhinitis 12/28/2014  . Intermittent tremor 12/28/2014  . Vitamin D deficiency 12/28/2014  . LBP (low back pain) 12/28/2014  . Left sciatic nerve pain 12/28/2014    Past Surgical History  Procedure Laterality Date  . Appendectomy    . Cervical polyp removal    . Ovarian cyst removal    . Hysteroscopy    . Dilation and curettage of uterus      Family History  Problem Relation Age  of Onset  . Hypertension Mother   . Diabetes Mother   . CAD Mother   . Cancer Mother     cervical  . CAD Father   . Hypertension Father   . Arthritis Father     Social History   Social History  . Marital Status: Married    Spouse Name: N/A  . Number of Children: N/A  . Years of Education: N/A   Occupational History  . Not on file.   Social History Main Topics  . Smoking status: Former Smoker -- .5 years    Quit date: 12/28/2002  . Smokeless tobacco: Never Used  . Alcohol Use: 0.0 oz/week     0 Standard drinks or equivalent per week  . Drug Use: No  . Sexual Activity: Yes   Other Topics Concern  . Not on file   Social History Narrative     Current outpatient prescriptions:  .  albuterol (ACCUNEB) 0.63 MG/3ML nebulizer solution, Take 1 mL by nebulization every 6 (six) hours as needed., Disp: 100 mL, Rfl: 2 .  albuterol (VENTOLIN HFA) 108 (90 Base) MCG/ACT inhaler, Inhale 2 puffs into the lungs every 4 (four) hours as needed., Disp: 1 Inhaler, Rfl: 1 .  budesonide-formoterol (SYMBICORT) 160-4.5 MCG/ACT inhaler, Inhale 2 puffs into the lungs 2 (two) times daily., Disp: 1 Inhaler, Rfl: 2 .  Cholecalciferol (VITAMIN D) 2000 UNITS tablet, Take 1 tablet by mouth daily., Disp: , Rfl:  .  fluticasone (FLONASE) 50 MCG/ACT nasal spray, Place 2 sprays into both nostrils daily., Disp: 16 g, Rfl: 5 .  hydrochlorothiazide (HYDRODIURIL) 25 MG tablet, Take 1 tablet (25 mg total) by mouth daily., Disp: 90 tablet, Rfl: 1 .  labetalol (NORMODYNE) 200 MG tablet, Take 1 tablet (200 mg total) by mouth 2 (two) times daily., Disp: 180 tablet, Rfl: 1 .  metFORMIN (GLUCOPHAGE) 500 MG tablet, Take 1 tablet (500 mg total) by mouth every evening., Disp: 90 tablet, Rfl: 1 .  montelukast (SINGULAIR) 10 MG tablet, Take 1 tablet (10 mg total) by mouth at bedtime., Disp: 90 tablet, Rfl: 1  Allergies  Allergen Reactions  . Penicillins Rash     ROS  Constitutional: Negative for fever or weight change.  Respiratory: Positive  for cough and shortness of breath.   Cardiovascular: Negative for chest pain or palpitations.  Gastrointestinal: Positive for abdominal pain and no bowel changes.  Musculoskeletal: Negative for gait problem, positive for  joint swelling.  Skin: Negative for rash.  Neurological: Negative for dizziness or headache.  No other specific complaints in a complete review of systems (except as listed in HPI above).  Objective  Filed Vitals:   03/30/15 0857  BP: 128/86  Pulse: 63   Temp: 98.4 F (36.9 C)  TempSrc: Oral  Resp: 12  Height: 5\' 2"  (1.575 m)  Weight: 258 lb (117.028 kg)  SpO2: 97%    Body mass index is 47.18 kg/(m^2).  Physical Exam  Constitutional: Patient appears well-developed and well-nourished. Obese No distress.  HEENT: head atraumatic, normocephalic, pupils equal and reactive to light,  neck supple, throat within normal limits Cardiovascular: Normal rate, regular rhythm and normal heart sounds.  No murmur heard. No BLE edema. Pulmonary/Chest: Effort normal and breath sounds normal. No respiratory distress. Abdominal: Soft.  There is left lower quadrant pain, negative CVA tenderness Psychiatric: Patient has a normal mood and affect. behavior is normal. Judgment and thought content normal. Muscular Skeletal: pain during rom of left knee, no effusion, some grinding with  extension   PHQ2/9: Depression screen [facility] 2/9 03/30/2015 12/28/2014  Decreased Interest 0 0  Down, Depressed, Hopeless 0 0  PHQ - 2 Score 0 0     Fall Risk: Fall Risk  03/30/2015 12/28/2014  Falls in the past year? No No     Functional Status Survey: Is the patient deaf or have difficulty hearing?: No Does the patient have difficulty seeing, even when wearing glasses/contacts?: No Does the patient have difficulty concentrating, remembering, or making decisions?: No Does the patient have difficulty walking or climbing stairs?: No (due leg pain) Does the patient have difficulty dressing or bathing?: No Does the patient have difficulty doing errands alone such as visiting a doctor's office or shopping?: No    Assessment & Plan  1. Benign essential HTN  At goal, continue medication   2. Type 2 diabetes mellitus without complication, without long-term current use of insulin ([place])  Explained importance of checking labs  3. Dyslipidemia  Discussed life style modification   4. Perennial allergic rhinitis  Continue medication   5. 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 Asthma, moderate persistent, uncomplicated  - albuterol (VENTOLIN HFA) 108 (90 Base) MCG/ACT inhaler; Inhale 2 puffs into the lungs every 4 (four) hours as needed.  Dispense: 1 Inhaler; Refill: 1 - budesonide-formoterol (SYMBICORT) 160-4.5 MCG/ACT inhaler; Inhale 2 puffs into the lungs 2 (two) times daily.  Dispense: 1 Inhaler; Refill: 2 - albuterol (ACCUNEB) 0.63 MG/3ML nebulizer solution; Take 1 mL by nebulization every 6 (six) hours as needed.  Dispense: 100 mL; Refill: 2  7. Change in bowel movement  - Ambulatory referral to Gastroenterology  8. Left lower quadrant pain  - Ambulatory referral to Gastroenterology - US Pelvis Complete; Future  9. Morbid obesity due to excess calories ([place])  Explained that losing weight will decrease her knee pain, discussed importance of stopping sodas, and drinking water or cafeinated herbal tea   10. Pregnancy test negative  - POCT urine pregnancy  11. History of ovarian cyst  - US Pelvis Complete; Future

## 2015-03-30 NOTE — Telephone Encounter (Signed)
changed

## 2015-03-30 NOTE — Telephone Encounter (Signed)
Also another fax stated that ins req prior auth for proair or proair respiclick

## 2015-03-30 NOTE — Telephone Encounter (Signed)
Got a fax from [place] stating that Symbicort 160-4.5 requires prior auth or it can be changed to [place], [place] or [place].  Refill request was sent to Dr. Steele Sizer for approval and submission.

## 2015-03-31 ENCOUNTER — Other Ambulatory Visit: Payer: Self-pay | Admitting: Family Medicine

## 2015-03-31 DIAGNOSIS — Z8742 Personal history of other diseases of the female genital tract: Secondary | ICD-10-CM

## 2015-03-31 DIAGNOSIS — R1032 Left lower quadrant pain: Secondary | ICD-10-CM

## 2015-04-05 ENCOUNTER — Telehealth: Payer: Self-pay

## 2015-04-05 NOTE — Telephone Encounter (Signed)
I have called patient to make an appointment for GI per referral. No answer. I have left a detailed message.

## 2015-06-29 ENCOUNTER — Ambulatory Visit: Payer: 59 | Admitting: Family Medicine

## 2015-06-30 ENCOUNTER — Other Ambulatory Visit: Payer: Self-pay | Admitting: Family Medicine

## 2015-06-30 NOTE — Telephone Encounter (Signed)
Patient requesting refill. 

## 2015-07-25 ENCOUNTER — Other Ambulatory Visit: Payer: Self-pay | Admitting: Family Medicine

## 2015-07-26 NOTE — Telephone Encounter (Signed)
Not able to leave message to inform patient that we need to schedule appointment-No voice mail set up.

## 2015-07-27 NOTE — Telephone Encounter (Signed)
Spoke with patient and she will call back to schedule appointment.

## 2015-09-07 ENCOUNTER — Encounter: Payer: 59 | Admitting: Family Medicine

## 2015-09-12 DIAGNOSIS — Z87891 Personal history of nicotine dependence: Secondary | ICD-10-CM | POA: Diagnosis not present

## 2015-09-12 DIAGNOSIS — N390 Urinary tract infection, site not specified: Secondary | ICD-10-CM | POA: Insufficient documentation

## 2015-09-12 DIAGNOSIS — J4540 Moderate persistent asthma, uncomplicated: Secondary | ICD-10-CM | POA: Diagnosis not present

## 2015-09-12 DIAGNOSIS — Z79899 Other long term (current) drug therapy: Secondary | ICD-10-CM | POA: Insufficient documentation

## 2015-09-12 DIAGNOSIS — N839 Noninflammatory disorder of ovary, fallopian tube and broad ligament, unspecified: Secondary | ICD-10-CM | POA: Diagnosis not present

## 2015-09-12 DIAGNOSIS — E119 Type 2 diabetes mellitus without complications: Secondary | ICD-10-CM | POA: Insufficient documentation

## 2015-09-12 DIAGNOSIS — E785 Hyperlipidemia, unspecified: Secondary | ICD-10-CM | POA: Diagnosis not present

## 2015-09-12 DIAGNOSIS — I10 Essential (primary) hypertension: Secondary | ICD-10-CM | POA: Insufficient documentation

## 2015-09-12 DIAGNOSIS — Z7984 Long term (current) use of oral hypoglycemic drugs: Secondary | ICD-10-CM | POA: Insufficient documentation

## 2015-09-12 DIAGNOSIS — R1030 Lower abdominal pain, unspecified: Secondary | ICD-10-CM | POA: Diagnosis present

## 2015-09-13 ENCOUNTER — Encounter: Payer: Self-pay | Admitting: Emergency Medicine

## 2015-09-13 ENCOUNTER — Emergency Department
Admission: EM | Admit: 2015-09-13 | Discharge: 2015-09-13 | Disposition: A | Discharge status: Home / Self Care | Payer: 59 | Attending: Emergency Medicine | Admitting: Emergency Medicine

## 2015-09-13 ENCOUNTER — Emergency Department: Payer: 59

## 2015-09-13 DIAGNOSIS — N838 Other noninflammatory disorders of ovary, fallopian tube and broad ligament: Secondary | ICD-10-CM

## 2015-09-13 DIAGNOSIS — R109 Unspecified abdominal pain: Secondary | ICD-10-CM

## 2015-09-13 DIAGNOSIS — N390 Urinary tract infection, site not specified: Secondary | ICD-10-CM

## 2015-09-13 LAB — POCT PREGNANCY, URINE: PREG TEST UR: NEGATIVE

## 2015-09-13 LAB — CBC
HEMATOCRIT: 38.2 % (ref 35.0–47.0)
Hemoglobin: 13.1 g/dL (ref 12.0–16.0)
MCH: 26.6 pg (ref 26.0–34.0)
MCHC: 34.2 g/dL (ref 32.0–36.0)
MCV: 77.7 fL — AB (ref 80.0–100.0)
PLATELETS: 277 10*3/uL (ref 150–440)
RBC: 4.91 MIL/uL (ref 3.80–5.20)
RDW: 14.7 % — AB (ref 11.5–14.5)
WBC: 9 10*3/uL (ref 3.6–11.0)

## 2015-09-13 LAB — WET PREP, GENITAL
Clue Cells Wet Prep HPF POC: NONE SEEN
SPERM: NONE SEEN
TRICH WET PREP: NONE SEEN
YEAST WET PREP: NONE SEEN

## 2015-09-13 LAB — URINALYSIS COMPLETE WITH MICROSCOPIC (ARMC ONLY)
BILIRUBIN URINE: NEGATIVE
GLUCOSE, UA: NEGATIVE mg/dL
Nitrite: NEGATIVE
PH: 5 (ref 5.0–8.0)
Protein, ur: 100 mg/dL — AB
Specific Gravity, Urine: 1.036 — ABNORMAL HIGH (ref 1.005–1.030)

## 2015-09-13 LAB — COMPREHENSIVE METABOLIC PANEL
ALBUMIN: 3.8 g/dL (ref 3.5–5.0)
ALT: 12 U/L — AB (ref 14–54)
AST: 15 U/L (ref 15–41)
Alkaline Phosphatase: 58 U/L (ref 38–126)
Anion gap: 7 (ref 5–15)
BUN: 10 mg/dL (ref 6–20)
CHLORIDE: 106 mmol/L (ref 101–111)
CO2: 25 mmol/L (ref 22–32)
CREATININE: 0.63 mg/dL (ref 0.44–1.00)
Calcium: 8.6 mg/dL — ABNORMAL LOW (ref 8.9–10.3)
GFR calc Af Amer: >60 mL/min (ref >60)
GLUCOSE: 130 mg/dL — AB (ref 65–99)
POTASSIUM: 3.5 mmol/L (ref 3.5–5.1)
SODIUM: 138 mmol/L (ref 135–145)
Total Bilirubin: 0.6 mg/dL (ref 0.3–1.2)
Total Protein: 7.2 g/dL (ref 6.5–8.1)

## 2015-09-13 LAB — LIPASE, BLOOD: LIPASE: 26 U/L (ref 11–51)

## 2015-09-13 LAB — CHLAMYDIA/NGC RT PCR (ARMC ONLY)
Chlamydia Tr: NOT DETECTED
N gonorrhoeae: NOT DETECTED

## 2015-09-13 MED ORDER — ONDANSETRON HCL 4 MG/2ML IJ SOLN
4.0000 mg | Freq: Once | INTRAMUSCULAR | Status: AC
  Administered 2015-09-13: 4 mg via INTRAVENOUS
  Filled 2015-09-13: qty 2

## 2015-09-13 MED ORDER — MORPHINE SULFATE (PF) 4 MG/ML IV SOLN
4.0000 mg | Freq: Once | INTRAVENOUS | Status: AC
  Administered 2015-09-13: 4 mg via INTRAVENOUS
  Filled 2015-09-13: qty 1

## 2015-09-13 MED ORDER — SODIUM CHLORIDE 0.9 % IV BOLUS (SEPSIS)
1000.0000 mL | Freq: Once | INTRAVENOUS | Status: AC
  Administered 2015-09-13: 1000 mL via INTRAVENOUS

## 2015-09-13 MED ORDER — DIATRIZOATE MEGLUMINE & SODIUM 66-10 % PO SOLN
15.0000 mL | Freq: Once | ORAL | Status: AC
  Administered 2015-09-13: 15 mL via ORAL

## 2015-09-13 MED ORDER — SULFAMETHOXAZOLE-TRIMETHOPRIM 800-160 MG PO TABS: 1.0000 | ORAL_TABLET | Freq: Two times a day (BID) | ORAL | Status: AC

## 2015-09-13 MED ORDER — TRAMADOL HCL 50 MG PO TABS: 50.0000 mg | ORAL_TABLET | Freq: Four times a day (QID) | ORAL | Status: DC | PRN

## 2015-09-13 MED ORDER — SULFAMETHOXAZOLE-TRIMETHOPRIM 800-160 MG PO TABS
1.0000 | ORAL_TABLET | Freq: Once | ORAL | Status: AC
  Administered 2015-09-13: 1 via ORAL
  Filled 2015-09-13: qty 1

## 2015-09-13 MED ORDER — IOPAMIDOL (ISOVUE-300) INJECTION 61%
100.0000 mL | Freq: Once | INTRAVENOUS | Status: AC | PRN
  Administered 2015-09-13: 100 mL via INTRAVENOUS

## 2015-09-13 NOTE — Discharge Instructions (Signed)

## 2015-09-13 NOTE — ED Notes (Signed)
Patient transported to CT 

## 2015-09-13 NOTE — ED Provider Notes (Signed)
[facility] Emergency Department Provider Note   ____________________________________________  Time seen: Approximately [id number] AM  I have reviewed the triage vital signs and the nursing notes.   HISTORY  Chief Complaint Abdominal Pain    HPI Kelsey Alvarez is a 45 y.o. female who comes into the hospital today with lower abdominal pain. She reports that the pain wraps around and goes into her back and also goes into the top of her legs. The patient reports that she started her period about 3 days ago and was only hurting a little bit. She reports that it seemed to get worse and worse and today was the worst. The patient has been alternating ibuprofen and Tylenol but she reports it does not help with her pain. The patient denies pain with urination but feels as though she is not emptying her bladder. She reports that whenever she goes to the bathroom not a whole lot comes out although she had the urge. The patient has had some nausea with no vomiting. She had 3 episodes of mucousy diarrhea today.The patient denies any fevers. The patient reports that she could not tolerate the pain so she decided to come in to the hospital today. The patient rates her pain a 10 out of 10 in intensity.   Past Medical History  Diagnosis Date  . Diabetes mellitus without complication ([place])   . Asthma   . GERD (gastroesophageal reflux disease)   . Allergy   . 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 Occasional tremors   . Dysmenorrhea   . Lumbago   . Edema   . Contact dermatitis   . Hyperlipidemia   . Obesity   . Hypertension   . Abnormal CBC   . Cervical polyp   . Chronic foot pain     Patient Active Problem List   Diagnosis Date Noted  . Benign essential HTN 12/28/2014  . Cervical polyp 12/28/2014  . Controlled type 2 diabetes mellitus without complication ([place]) 123XX123  . Dyslipidemia 12/28/2014  . Dysmenorrhea 12/28/2014  . Edema 12/28/2014  . Female infertility  12/28/2014  . Gastro-esophageal reflux disease without esophagitis 12/28/2014  . Morbid obesity ([place]) 12/28/2014  . Asthma, moderate persistent, poorly-controlled 12/28/2014  . Perennial allergic rhinitis 12/28/2014  . Intermittent tremor 12/28/2014  . Vitamin D deficiency 12/28/2014  . LBP (low back pain) 12/28/2014  . Left sciatic nerve pain 12/28/2014    Past Surgical History  Procedure Laterality Date  . Appendectomy    . Cervical polyp removal    . Ovarian cyst removal    . Hysteroscopy    . Dilation and curettage of uterus    . Oophorectomy      Current Outpatient Rx  Name  Route  Sig  Dispense  Refill  . albuterol (PROAIR HFA) 108 (90 Base) MCG/ACT inhaler   Inhalation   Inhale 2 puffs into the lungs every 6 (six) hours as needed for wheezing or shortness of breath.   1 Inhaler   0   . Cholecalciferol (VITAMIN D) 2000 UNITS tablet   Oral   Take 1 tablet by mouth daily.         . fexofenadine (ALLEGRA) 180 MG tablet   Oral   Take 180 mg by mouth daily.         . fluticasone (FLONASE) 50 MCG/ACT nasal spray   Each Nare   Place 2 sprays into both nostrils daily.   16 g   5   . hydrochlorothiazide (  HYDRODIURIL) 25 MG tablet   Oral   Take 1 tablet (25 mg total) by mouth daily. Patient taking differently: Take 25 mg by mouth every other day.    90 tablet   1   . labetalol (NORMODYNE) 200 MG tablet      TAKE ONE TABLET BY MOUTH TWICE DAILY   180 tablet   0   . metFORMIN (GLUCOPHAGE) 500 MG tablet      TAKE ONE TABLET BY MOUTH IN THE EVENING   90 tablet   0   . mometasone-formoterol (DULERA) 100-5 MCG/ACT AERO   Inhalation   Inhale 2 puffs into the lungs 2 (two) times daily.   1 Inhaler   2     In place of Symbicort per insurance request   . montelukast (SINGULAIR) 10 MG tablet      TAKE ONE TABLET BY MOUTH AT BEDTIME   90 tablet   0   . sulfamethoxazole-trimethoprim (BACTRIM DS,SEPTRA DS) 800-160 MG tablet   Oral   Take 1 tablet by  mouth 2 (two) times daily.   14 tablet   0     Allergies Penicillins  Family History  Problem Relation Age of Onset  . Hypertension Mother   . Diabetes Mother   . CAD Mother   . Cancer Mother     cervical  . CAD Father   . Hypertension Father   . Arthritis Father     Social History Social History  Substance Use Topics  . Smoking status: Former Smoker -- .5 years    Quit date: 12/28/2002  . Smokeless tobacco: Never Used  . Alcohol Use: 0.0 oz/week    0 Standard drinks or equivalent per week    Review of Systems Constitutional: No fever/chills Eyes: No visual changes. ENT: No sore throat. Cardiovascular: Denies chest pain. Respiratory: Denies shortness of breath. Gastrointestinal:  abdominal pain.   nausea,  vomiting.  diarrhea.  No constipation. Genitourinary: Negative for dysuria. Musculoskeletal: Negative for back pain. Skin: Negative for rash. Neurological: Negative for headaches, focal weakness or numbness.  10-point ROS otherwise negative.  ____________________________________________   PHYSICAL EXAM:  VITAL SIGNS: ED Triage Vitals  Enc Vitals Group     BP 09/13/15 0039 132/61 mmHg     Pulse Rate 09/13/15 0039 61     Resp 09/13/15 0039 20     Temp 09/13/15 0039 98.5 F (36.9 C)     Temp Source 09/13/15 0039 Oral     SpO2 09/13/15 0039 98 %     Weight 09/13/15 0039 253 lb (114.76 kg)     Height 09/13/15 0039 5\' 1"  (1.549 m)     Head Cir --      Peak Flow --      Pain Score 09/13/15 0039 10     Pain Loc --      Pain Edu? --      Excl. in [place]? --     Constitutional: Alert and oriented. Well appearing and in Moderate distress. Eyes: Conjunctivae are normal. PERRL. EOMI. Head: Atraumatic. Nose: No congestion/rhinnorhea. Mouth/Throat: Mucous membranes are moist.  Oropharynx non-erythematous. Cardiovascular: Normal rate, regular rhythm. Grossly normal heart sounds.  Good peripheral circulation. Respiratory: Normal respiratory effort.  No  retractions. Lungs CTAB. Gastrointestinal: Soft with lower abdominal tenderness to palpation. No distention. Positive bowel sounds, bilateral CVA tenderness to palpation Genitourinary: Normal external genitalia, mild blood in vaginal vault, midline and right adnexal tenderness to palpation. Musculoskeletal: No lower extremity tenderness nor edema.  Neurologic:  Normal speech and language.  Skin:  Skin is warm, dry and intact.  Psychiatric: Mood and affect are normal.   ____________________________________________   LABS (all labs ordered are listed, but only abnormal results are displayed)  Labs Reviewed  COMPREHENSIVE METABOLIC PANEL - Abnormal; Notable for the following:    Glucose, Bld 130 (*)    Calcium 8.6 (*)    ALT 12 (*)    All other components within normal limits  CBC - Abnormal; Notable for the following:    MCV 77.7 (*)    RDW 14.7 (*)    All other components within normal limits  URINALYSIS COMPLETEWITH MICROSCOPIC ([facility] ONLY) - Abnormal; Notable for the following:    Color, Urine YELLOW (*)    APPearance HAZY (*)    Ketones, ur TRACE (*)    Specific Gravity, Urine 1.036 (*)    Hgb urine dipstick 3+ (*)    Protein, ur 100 (*)    Leukocytes, UA TRACE (*)    Bacteria, UA RARE (*)    Squamous Epithelial / LPF 0-5 (*)    All other components within normal limits  WET PREP, GENITAL  CHLAMYDIA/NGC RT PCR ([facility] ONLY)  URINE CULTURE  LIPASE, BLOOD  POC URINE PREG, ED  POCT PREGNANCY, URINE   ____________________________________________  EKG  None ____________________________________________  RADIOLOGY  CT abdomen and pelvis: Enlarged left ovary with heterogeneous appearance, consider ultrasound for better characterization, otherwise no acute process demonstrated within the abdomen or pelvis, no evidence of bowel obstruction or inflammation. ____________________________________________   PROCEDURES  Procedure(s) performed:  None  Procedures  Critical Care performed: No  ____________________________________________   INITIAL IMPRESSION / ASSESSMENT AND PLAN / ED COURSE  Pertinent labs & imaging results that were available during my care of the patient were reviewed by me and considered in my medical decision making (see chart for details).  This is a 45 year old female who comes into the hospital today with lower abdominal pain. She reports that this been getting worse over the past couple of days. She thought it was initially her period. The patient does have too numerous to count whites and reds in her urine but she is on her period. I will give the patient a dose of morphine as well as a liter of normal saline. She will have a CT scan and I will reassess the patient.  My initial concern that the patient may have a kidney infection given her flank pain. She does have an enlarged left ovary. She'll be sent for an ultrasound of her pelvis. I will give her a dose of Bactrim to treat a possible UTI and her care will be signed out to Dr. Corky Downs who will follow-up the results of her ultrasound. ____________________________________________   FINAL CLINICAL IMPRESSION(S) / ED DIAGNOSES  Final diagnoses:  Abdominal pain  UTI (lower urinary tract infection)      NEW MEDICATIONS STARTED DURING THIS VISIT:  New Prescriptions   SULFAMETHOXAZOLE-TRIMETHOPRIM (BACTRIM DS,SEPTRA DS) 800-160 MG TABLET    Take 1 tablet by mouth 2 (two) times daily.     Note:  This document was prepared using Dragon voice recognition software and may include unintentional dictation errors.    Loney Hering, MD 09/13/15 [phone]

## 2015-09-13 NOTE — ED Notes (Signed)
Pt presents to ED with lower abd cramping the past 3 days; pain worsened today. Pt states she is on her period and thought it was her normal cramping it has worsened and last longer than her norm. +diarrhea and +nausea today.

## 2015-09-13 NOTE — ED Provider Notes (Signed)
Ultrasound concerning for possible ovarian neoplasm. Discussed this with the patient and stressed the need for outpatient follow-up. Discussed with Dr. Glennon Mac of OB/GYN who the patient has seen in the past who agrees that he will see the patient has outpatient. Asked the secretary to schedule appointment this week for the patient  Patient has appt on Monday July 17 with Dr. Davina Poke, MD 09/13/15 [phone]

## 2015-09-14 LAB — URINE CULTURE

## 2015-10-05 ENCOUNTER — Encounter: Payer: Self-pay | Admitting: Obstetrics and Gynecology

## 2015-10-05 ENCOUNTER — Inpatient Hospital Stay: Payer: 59 | Attending: Obstetrics and Gynecology | Admitting: Obstetrics and Gynecology

## 2015-10-05 ENCOUNTER — Other Ambulatory Visit: Payer: Self-pay

## 2015-10-05 VITALS — BP 163/78 | HR 54 | Temp 97.0°F | Ht 62.0 in | Wt 258.2 lb

## 2015-10-05 DIAGNOSIS — Z9049 Acquired absence of other specified parts of digestive tract: Secondary | ICD-10-CM | POA: Insufficient documentation

## 2015-10-05 DIAGNOSIS — D529 Folate deficiency anemia, unspecified: Secondary | ICD-10-CM

## 2015-10-05 DIAGNOSIS — Z7984 Long term (current) use of oral hypoglycemic drugs: Secondary | ICD-10-CM | POA: Insufficient documentation

## 2015-10-05 DIAGNOSIS — E119 Type 2 diabetes mellitus without complications: Secondary | ICD-10-CM | POA: Diagnosis not present

## 2015-10-05 DIAGNOSIS — Z79899 Other long term (current) drug therapy: Secondary | ICD-10-CM | POA: Diagnosis not present

## 2015-10-05 DIAGNOSIS — N946 Dysmenorrhea, unspecified: Secondary | ICD-10-CM | POA: Insufficient documentation

## 2015-10-05 DIAGNOSIS — N83202 Unspecified ovarian cyst, left side: Secondary | ICD-10-CM | POA: Insufficient documentation

## 2015-10-05 DIAGNOSIS — Z87891 Personal history of nicotine dependence: Secondary | ICD-10-CM | POA: Diagnosis not present

## 2015-10-05 DIAGNOSIS — J4540 Moderate persistent asthma, uncomplicated: Secondary | ICD-10-CM | POA: Diagnosis not present

## 2015-10-05 DIAGNOSIS — N939 Abnormal uterine and vaginal bleeding, unspecified: Secondary | ICD-10-CM | POA: Diagnosis not present

## 2015-10-05 DIAGNOSIS — N841 Polyp of cervix uteri: Secondary | ICD-10-CM | POA: Diagnosis not present

## 2015-10-05 DIAGNOSIS — E785 Hyperlipidemia, unspecified: Secondary | ICD-10-CM | POA: Diagnosis not present

## 2015-10-05 DIAGNOSIS — Z01818 Encounter for other preprocedural examination: Secondary | ICD-10-CM | POA: Diagnosis not present

## 2015-10-05 DIAGNOSIS — I10 Essential (primary) hypertension: Secondary | ICD-10-CM | POA: Insufficient documentation

## 2015-10-05 DIAGNOSIS — K219 Gastro-esophageal reflux disease without esophagitis: Secondary | ICD-10-CM | POA: Diagnosis not present

## 2015-10-05 DIAGNOSIS — E559 Vitamin D deficiency, unspecified: Secondary | ICD-10-CM | POA: Insufficient documentation

## 2015-10-05 DIAGNOSIS — M5442 Lumbago with sciatica, left side: Secondary | ICD-10-CM | POA: Insufficient documentation

## 2015-10-05 DIAGNOSIS — N979 Female infertility, unspecified: Secondary | ICD-10-CM | POA: Diagnosis not present

## 2015-10-05 DIAGNOSIS — D259 Leiomyoma of uterus, unspecified: Secondary | ICD-10-CM | POA: Insufficient documentation

## 2015-10-05 LAB — PREGNANCY, URINE: PREG TEST UR: NEGATIVE

## 2015-10-05 NOTE — Progress Notes (Signed)
Patient here for initial visit. States her periods are long, more frequent  and she has a lot of cramping. She has pain on her left side that radiates down her leg.

## 2015-10-05 NOTE — H&P (Signed)
Gynecologic Oncology Consult Visit   Referring Provider: Prentice Docker, MD  Chief Concern: symptomatic complex ovarian cyst  Subjective:  Kelsey Alvarez is a 45 y.o. G0P0 female who is seen in consultation from Dr. Glennon Mac with symptomatic complex ovarian cyst s/p prior ovarian cystectomy and partial resection left tube 1997.   On 09/13/2015 she presented to ED with lower abd cramping the past 3 days with worsening of pain during her menses.She also complained of +diarrhea and +nausea.   09/13/2015 CT A/P  IMPRESSION: Enlarged left ovary with heterogeneous appearance. Otherwise, no acute process demonstrated in the abdomen or pelvis. No evidence of bowel obstruction or inflammation.  09/13/2015 Pelvic US FINDINGS: Uterus: 5.4 x 4.1 x 5.9 cm. Retroverted. Exophytic leiomyoma LEFT lateral uterus 3.4 x 3.3 x 3.5 cm.  Right ovary: 2.3 x 2.4 x 1.6 cm. Normal morphology without mass.  Left ovary: 6.2 x 4.8 x 5.0 cm. Abnormal appearance. Complicated isoechoic homogeneous nodule 3.5 x 3.9 x 2.0 cm lacking internal blood flow. Several additional cystic lesions versus single complicated cystic lesion with thick irregular septations, in aggregate 4.5 x 3.1 x 3.7 cm. 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 Blood flow present within LEFT ovary on color Doppler imaging.  Trace free pelvic fluid.  No additional adnexal masses.  She was seen by Dr. Glennon Mac on 09/19/2015 and surgery was recommended and he requested Gyn Oncology consultation for assistance if malignancy.   09/20/2015 CA125: 28.0   Of note she also had an open appendectomy in 1987 and the appendix ruptured right before removal. She continues to complain of intermittent LLQ pain. She also reported increasing menstrual flow with periods lasting longer than a week and using maxi pads. She may have an accident at night but not during the day. She had a D&C 3 years ago with improvement, but her periods have  gradually been getting worse over the last year with increase flow and pain. She has not been on any therapy for this issue.   She and her husband have had fertility issues and unable to conceive. They have not pursued IVF or specialist referral. At this point they are not actively attempt to conceive beyond not using contraception.    Problem List: Patient Active Problem List   Diagnosis Date Noted  . Cyst of left ovary 10/05/2015  . Benign essential HTN 12/28/2014  . Cervical polyp 12/28/2014  . Controlled type 2 diabetes mellitus without complication ([place]) 123XX123  . Dyslipidemia 12/28/2014  . Dysmenorrhea 12/28/2014  . Edema 12/28/2014  . Female infertility 12/28/2014  . Gastro-esophageal reflux disease without esophagitis 12/28/2014  . Morbid obesity ([place]) 12/28/2014  . Asthma, moderate persistent, poorly-controlled 12/28/2014  . Perennial allergic rhinitis 12/28/2014  . Intermittent tremor 12/28/2014  . Vitamin D deficiency 12/28/2014  . LBP (low back pain) 12/28/2014  . Left sciatic nerve pain 12/28/2014    Past Medical History: Past Medical History:  Diagnosis Date  . Abnormal CBC   . Allergy   . Asthma   . Cervical polyp   . Chronic foot pain   . Contact dermatitis   . Diabetes mellitus without complication ([place])   . Dysmenorrhea   . Edema   . 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 GERD (gastroesophageal reflux disease)   . Hyperlipidemia   . Hypertension   . Lumbago   . Obesity   . Occasional tremors     Past Surgical History: Past Surgical History:  Procedure Laterality Date  . APPENDECTOMY    .  cervical polyp removal    . DILATION AND CURETTAGE OF UTERUS    . HYSTEROSCOPY    . OOPHORECTOMY    . OVARIAN CYST REMOVAL      Past Gynecologic History:  As per HPI Last Menstrual Period: 09/12/2015 History of Abnormal pap: no, no abnormalities Last pap: last year per patient Contraception: no method Sexually active: no  OB History:  OB History  Gravida  Para Term Preterm AB Living  0 0 0 0 0 0  SAB TAB Ectopic Multiple Live Births  0 0 0 0 0        Family History: Family History  Problem Relation Age of Onset  . Hypertension Mother   . Diabetes Mother   . CAD Mother   . Cancer Mother     cervical  . CAD Father   . Hypertension Father   . Arthritis Father     Social History: Social History   Social History  . Marital status: Married    Spouse name: N/A  . Number of children: N/A  . Years of education: N/A   Occupational History  . Museum/gallery curator    Social History Main Topics  . Smoking status: Former Smoker    Years: 0.50    Quit date: 12/28/2002  . Smokeless tobacco: Never Used  . Alcohol use 0.0 oz/week  . Drug use: No  . Sexual activity: Yes   Other Topics Concern  . Not on file   Social History Narrative  . No narrative on file    Allergies: Allergies  Allergen Reactions  . Penicillins Anaphylaxis and Rash      Childhood allergy  . Shellfish Allergy Shortness Of Breath    Current Medications: Current Outpatient Prescriptions  Medication Sig Dispense Refill  . albuterol (PROAIR HFA) 108 (90 Base) MCG/ACT inhaler Inhale 2 puffs into the lungs every 6 (six) hours as needed for wheezing or shortness of breath. 1 Inhaler 0  . Cholecalciferol (VITAMIN D) 2000 UNITS tablet Take 1 tablet by mouth daily.    . fexofenadine (ALLEGRA) 180 MG tablet Take 180 mg by mouth daily.    . fluticasone (FLONASE) 50 MCG/ACT nasal spray Place 2 sprays into both nostrils daily. 16 g 5  . hydrochlorothiazide (HYDRODIURIL) 25 MG tablet Take 1 tablet (25 mg total) by mouth daily. (Patient taking differently: Take 25 mg by mouth every other day. ) 90 tablet 1  . labetalol (NORMODYNE) 200 MG tablet TAKE ONE TABLET BY MOUTH TWICE DAILY 180 tablet 0  . metFORMIN (GLUCOPHAGE) 500 MG tablet TAKE ONE TABLET BY MOUTH IN THE EVENING 90 tablet 0  . mometasone-formoterol (DULERA) 100-5 MCG/ACT AERO Inhale 2 puffs into the lungs 2 (two)  times daily. 1 Inhaler 2  . montelukast (SINGULAIR) 10 MG tablet TAKE ONE TABLET BY MOUTH AT BEDTIME 90 tablet 0  . traMADol (ULTRAM) 50 MG tablet Take 1 tablet (50 mg total) by mouth every 6 (six) hours as needed. 20 tablet 0   No current facility-administered medications for this visit.     Review of Systems General: negative Skin: negative Eyes: she does have visual issues; but negative for, changes in vision HEENT: negative Pulmonary: negative for, dyspnea, orthopnea, productive cough Cardiac: negative for, palpitations, pain Gastrointestinal: negative for, nausea, vomiting, constipation, diarrhea, hematemesis, hematochezia Genitourinary/Sexual: negative for, dysuria, infections, incontinence Ob/Gyn: as per HPI Musculoskeletal: pain in legs and joint pain Hematology: easy bruising Neurologic/Psych: headaches  Objective:  Physical Examination:  BP (!) 163/78 (BP Location:  Left Arm, Patient Position: Sitting)   Pulse (!) 54   Temp 97 F (36.1 C) (Tympanic)   Ht 5\' 2"  (1.575 m)   Wt 258 lb 2.5 oz (117.1 kg)   LMP 09/11/2015 Comment: neg preg test  BMI 47.22 kg/m    ECOG Performance Status: 0 - Asymptomatic  General appearance: alert, cooperative and appears stated age HEENT:PERRLA, extra ocular movement intact and sclera clear, anicteric Lymph node survey: non-palpable, axillary, inguinal, supraclavicular Cardiovascular: regular rate and rhythm Respiratory: normal air entry, lungs clear to auscultation Breast exam: not examined. Abdomen: obese soft, non-tender, without masses or organomegaly, no masses palpated, no hernias and well healed incision tranverse lower abdomen. No ascites.  Back: inspection of back is normal Extremities: extremities normal, atraumatic, no cyanosis or edema Neurological exam reveals alert, oriented, normal speech, no focal findings or movement disorder noted.  Pelvic: exam chaperoned by nurse;  Vulva: normal appearing vulva with no masses,  tenderness or lesions; Vagina: normal vagina; Adnexa: left fullness and tenderness on exam. No definite mass. Right negative. Uterus: could not determine size due to habitus, but not grossly enlarged.  Cervix: nulliparous appearance with 5 mm polyp at 6 o'clock; Rectal: not indicated  PROCEDURE: The risks and benefits of the procedure were reviewed and informed consent obtained. Time out was performed. The patient received pre-procedure teaching and expressed understanding. The post-procedure instructions were reviewed with the patient and she expressed understanding. The patient does not have any barriers to learning.  Cervical biopsies was performed at 6 o'clock removing the polyp. The single tooth tenaculum applied. EMBx attempted but could not be performed due to cervical stenosis. The patient was uncomfortable and procedure terminated. Procedure completed without complications. Hemostasis adequate with AgNO3. The patient tolerated the procedure well.   Post-procedure evaluation the patient was stable without complaints.   Lab Review Labs pending  Radiologic Imaging: n/a    Assessment:  Kelsey Alvarez is a 45 y.o. female diagnosed with symptomatic complex ovarian cyst. Abnormal uterine bleeding. Cervical polyp. Cervical stenosis. Easy bruising. Medical co-morbidities complicating care: diabetes and obesity as well a prior surgeries and infertility.  Plan:   Problem List Items Addressed This Visit      Genitourinary   Cervical polyp   Cyst of left ovary - Primary    Other Visit Diagnoses    Abnormal vaginal bleeding          We discussed options for management. The differential diagnosis includes benign ovarian cyst, endometrioma, endometriosis, LMP tumor, ovarian stromal tumor, and ovarian malignancy. She also complains of abnormal vaginal bleeding which I suspect is an uterine source. She is at risk for uterine malignancy and hyperplasia given obesity. Other possibility include  endometrial polyp and leiomyoma. I could not perform endometrial biopsy due to cervical stenosis. The cervical polyp was removed and I do not think that is the etiology of her pain. Based on all these issues , I recommend definitive surgical evaluation with L/S LSO, right salpingectomy, H-scope and D&C. If malignancy on D&C they can perform SLN injection, mapping, and sentinel node biopsy with hysterectomy. If she does not have endometrial cancer she will preserve her uterus. If adnexal cystic mass is malignant or she has uterine cancer she is consented for hysterectomy, omentectomy, node dissection, and biopsies. Her surgery will be more difficult due to prior surgical procedures, the possibility of adhesive disease is high given ruptured appendix, obesity, and diabetes.   Risks were discussed in detail. These include infection, anesthesia, bleeding, transfusion,  wound separation, vaginal cuff dehiscence, medical issues (blood clots, stroke, heart attack, fluid in the lungs, pneumonia, abnormal heart rhythm, death), possible exploratory surgery with larger incision, lymphedema, lymphocyst, allergic reaction, injury to adjacent organs (bowel, bladder, blood vessels, nerves, ureters, uterus)   Suggested return to Gynecologic Oncology clinic in  prn. If no evidence of malignancy she will follow up with Dr. Glennon Mac.    We will check labs today including PT/PTT given easy bruising and HbA1c.   The patient's diagnosis, an outline of the further diagnostic and laboratory studies which will be required, the recommendation, and alternatives were discussed.  All questions were answered to the patient's satisfaction.  A total of 120 minutes were spent with the patient/family today; 25% was spent in education, counseling and coordination of care for ovarian cyst and abnormal uterine bleeding.   Gillis Ends, MD    CC:  Prentice Docker, MD

## 2015-10-05 NOTE — Progress Notes (Signed)
  Oncology Nurse Navigator Documentation  Navigator Location: [facility] (10/05/15 0900) Navigator Encounter Type:  (GYN clinic) (10/05/15 0900)           Patient Visit Type: Initial (Gyn) (10/05/15 0900) Treatment Phase: Abnormal Scans (10/05/15 0900) Barriers/Navigation Needs: Coordination of Care (10/05/15 0900)   Interventions: Coordination of Care (10/05/15 0900)            Acuity: Level 2 (10/05/15 0900)   Acuity Level 2: Initial guidance, education and coordination as needed;Educational needs;Ongoing guidance and education throughout treatment as needed (10/05/15 0900)     Time Spent with Patient: 60 (10/05/15 0900)   Pregnancy test obtained and sent to lab stat. Chaperoned pelvic exam and biopsies.

## 2015-10-05 NOTE — Progress Notes (Signed)
Gynecologic Oncology Consult Visit   Referring Provider: Prentice Docker, MD  Chief Concern: symptomatic complex ovarian cyst  Subjective:  Kelsey Alvarez is a 45 y.o. G0P0 female who is seen in consultation from Dr. Glennon Mac with symptomatic complex ovarian cyst s/p prior ovarian cystectomy and partial resection left tube 1997.   On 09/13/2015 she presented to ED with lower abd cramping the past 3 days with worsening of pain during her menses.She also complained of +diarrhea and +nausea.   09/13/2015 CT A/P  IMPRESSION: Enlarged left ovary with heterogeneous appearance. Otherwise, no acute process demonstrated in the abdomen or pelvis. No evidence of bowel obstruction or inflammation.  09/13/2015 Pelvic US FINDINGS: Uterus: 5.4 x 4.1 x 5.9 cm. Retroverted. Exophytic leiomyoma LEFT lateral uterus 3.4 x 3.3 x 3.5 cm.  Right ovary: 2.3 x 2.4 x 1.6 cm. Normal morphology without mass.  Left ovary: 6.2 x 4.8 x 5.0 cm. Abnormal appearance. Complicated isoechoic homogeneous nodule 3.5 x 3.9 x 2.0 cm lacking internal blood flow. Several additional cystic lesions versus single complicated cystic lesion with thick irregular septations, in aggregate 4.5 x 3.1 x 3.7 cm. 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 Blood flow present within LEFT ovary on color Doppler imaging.  Trace free pelvic fluid.  No additional adnexal masses.  She was seen by Dr. Glennon Mac on 09/19/2015 and surgery was recommended and he requested Gyn Oncology consultation for assistance if malignancy.   09/20/2015 CA125: 28.0   Of note she also had an open appendectomy in 1987 and the appendix ruptured right before removal. She continues to complain of intermittent LLQ pain. She also reported increasing menstrual flow with periods lasting longer than a week and using maxi pads. She may have an accident at night but not during the day. She had a D&C 3 years ago with improvement, but her periods have  gradually been getting worse over the last year with increase flow and pain. She has not been on any therapy for this issue.   She and her husband have had fertility issues and unable to conceive. They have not pursued IVF or specialist referral. At this point they are not actively attempt to conceive beyond not using contraception.    Problem List: Patient Active Problem List   Diagnosis Date Noted  . Cyst of left ovary 10/05/2015  . Benign essential HTN 12/28/2014  . Cervical polyp 12/28/2014  . Controlled type 2 diabetes mellitus without complication ([place]) 123XX123  . Dyslipidemia 12/28/2014  . Dysmenorrhea 12/28/2014  . Edema 12/28/2014  . Female infertility 12/28/2014  . Gastro-esophageal reflux disease without esophagitis 12/28/2014  . Morbid obesity ([place]) 12/28/2014  . Asthma, moderate persistent, poorly-controlled 12/28/2014  . Perennial allergic rhinitis 12/28/2014  . Intermittent tremor 12/28/2014  . Vitamin D deficiency 12/28/2014  . LBP (low back pain) 12/28/2014  . Left sciatic nerve pain 12/28/2014    Past Medical History: Past Medical History:  Diagnosis Date  . Abnormal CBC   . Allergy   . Asthma   . Cervical polyp   . Chronic foot pain   . Contact dermatitis   . Diabetes mellitus without complication ([place])   . Dysmenorrhea   . Edema   . 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 GERD (gastroesophageal reflux disease)   . Hyperlipidemia   . Hypertension   . Lumbago   . Obesity   . Occasional tremors     Past Surgical History: Past Surgical History:  Procedure Laterality Date  . APPENDECTOMY    .  cervical polyp removal    . DILATION AND CURETTAGE OF UTERUS    . HYSTEROSCOPY    . OOPHORECTOMY    . OVARIAN CYST REMOVAL      Past Gynecologic History:  As per HPI Last Menstrual Period: 09/12/2015 History of Abnormal pap: no, no abnormalities Last pap: last year per patient Contraception: no method Sexually active: no  OB History:  OB History  Gravida  Para Term Preterm AB Living  0 0 0 0 0 0  SAB TAB Ectopic Multiple Live Births  0 0 0 0 0        Family History: Family History  Problem Relation Age of Onset  . Hypertension Mother   . Diabetes Mother   . CAD Mother   . Cancer Mother     cervical  . CAD Father   . Hypertension Father   . Arthritis Father     Social History: Social History   Social History  . Marital status: Married    Spouse name: N/A  . Number of children: N/A  . Years of education: N/A   Occupational History  . Museum/gallery curator    Social History Main Topics  . Smoking status: Former Smoker    Years: 0.50    Quit date: 12/28/2002  . Smokeless tobacco: Never Used  . Alcohol use 0.0 oz/week  . Drug use: No  . Sexual activity: Yes   Other Topics Concern  . Not on file   Social History Narrative  . No narrative on file    Allergies: PCN and fish (she has not tried shellfish; regular fish makes her short of breath)  Current Medications: Current Outpatient Prescriptions  Medication Sig Dispense Refill  . albuterol (PROAIR HFA) 108 (90 Base) MCG/ACT inhaler Inhale 2 puffs into the lungs every 6 (six) hours as needed for wheezing or shortness of breath. 1 Inhaler 0  . Cholecalciferol (VITAMIN D) 2000 UNITS tablet Take 1 tablet by mouth daily.    . fexofenadine (ALLEGRA) 180 MG tablet Take 180 mg by mouth daily.    . fluticasone (FLONASE) 50 MCG/ACT nasal spray Place 2 sprays into both nostrils daily. 16 g 5  . hydrochlorothiazide (HYDRODIURIL) 25 MG tablet Take 1 tablet (25 mg total) by mouth daily. (Patient taking differently: Take 25 mg by mouth every other day. ) 90 tablet 1  . labetalol (NORMODYNE) 200 MG tablet TAKE ONE TABLET BY MOUTH TWICE DAILY 180 tablet 0  . metFORMIN (GLUCOPHAGE) 500 MG tablet TAKE ONE TABLET BY MOUTH IN THE EVENING 90 tablet 0  . mometasone-formoterol (DULERA) 100-5 MCG/ACT AERO Inhale 2 puffs into the lungs 2 (two) times daily. 1 Inhaler 2  . montelukast (SINGULAIR) 10  MG tablet TAKE ONE TABLET BY MOUTH AT BEDTIME 90 tablet 0  . traMADol (ULTRAM) 50 MG tablet Take 1 tablet (50 mg total) by mouth every 6 (six) hours as needed. 20 tablet 0   No current facility-administered medications for this visit.     Review of Systems General: negative for, changes in vision Skin: negative Eyes: negative for, changes in vision HEENT: negative Pulmonary: negative for, dyspnea, orthopnea, productive cough Cardiac: negative for, palpitations, pain Gastrointestinal: negative for, nausea, vomiting, constipation, diarrhea, hematemesis, hematochezia Genitourinary/Sexual: negative for, dysuria, infections, incontinence Ob/Gyn: as per HPI Musculoskeletal: pain in legs and joint pain Hematology: easy bruising Neurologic/Psych: headaches  Objective:  Physical Examination:  BP (!) 163/78 (BP Location: Left Arm, Patient Position: Sitting)   Pulse (!) 54   Temp  91 F (36.1 C) (Tympanic)   Ht 5\' 2"  (1.575 m)   Wt 258 lb 2.5 oz (117.1 kg)   LMP 09/11/2015 Comment: neg preg test  BMI 47.22 kg/m    ECOG Performance Status: 0 - Asymptomatic  General appearance: alert, cooperative and appears stated age HEENT:PERRLA, extra ocular movement intact and sclera clear, anicteric Lymph node survey: non-palpable, axillary, inguinal, supraclavicular Cardiovascular: regular rate and rhythm Respiratory: normal air entry, lungs clear to auscultation Breast exam: not examined. Abdomen: obese soft, non-tender, without masses or organomegaly, no masses palpated, no hernias and well healed incision tranverse lower abdomen. No ascites.  Back: inspection of back is normal Extremities: extremities normal, atraumatic, no cyanosis or edema Neurological exam reveals alert, oriented, normal speech, no focal findings or movement disorder noted.  Pelvic: exam chaperoned by nurse;  Vulva: normal appearing vulva with no masses, tenderness or lesions; Vagina: normal vagina; Adnexa: left fullness  and tenderness on exam. No definite mass. Right negative. Uterus: could not determine size due to habitus, but not grossly enlarged.  Cervix: nulliparous appearance with 5 mm polyp at 6 o'clock; Rectal: not indicated  PROCEDURE: The risks and benefits of the procedure were reviewed and informed consent obtained. Time out was performed. The patient received pre-procedure teaching and expressed understanding. The post-procedure instructions were reviewed with the patient and she expressed understanding. The patient does not have any barriers to learning.  Cervical biopsies was performed at 6 o'clock removing the polyp. The single tooth tenaculum applied. EMBx attempted but could not be performed due to cervical stenosis. The patient was uncomfortable and procedure terminated. Procedure completed without complications. Hemostasis adequate with AgNO3. The patient tolerated the procedure well.   Post-procedure evaluation the patient was stable without complaints.   Lab Review Labs pending  Radiologic Imaging: n/a    Assessment:  Baelee Woehrle is a 45 y.o. female diagnosed with symptomatic complex ovarian cyst. Abnormal uterine bleeding. Cervical polyp. Cervical stenosis. Easy bruising. Medical co-morbidities complicating care: diabetes and obesity as well a prior surgeries and infertility.  Plan:   Problem List Items Addressed This Visit      Genitourinary   Cervical polyp   Cyst of left ovary - Primary    Other Visit Diagnoses    Abnormal vaginal bleeding          We discussed options for management. The differential diagnosis includes benign ovarian cyst, endometrioma, endometriosis, LMP tumor, ovarian stromal tumor, and ovarian malignancy. She also complains of abnormal vaginal bleeding which I suspect is an uterine source. She is at risk for uterine malignancy and hyperplasia given obesity. Other possibility include endometrial polyp and leiomyoma. I could not perform endometrial  biopsy due to cervical stenosis. The cervical polyp was removed and I do not think that is the etiology of her pain. Based on all these issues , I recommend definitive surgical evaluation with L/S LSO, right salpingectomy, H-scope and D&C. If malignancy on D&C they can perform SLN injection, mapping, and sentinel node biopsy with hysterectomy. If she does not have endometrial cancer she will preserve her uterus. If adnexal cystic mass is malignant or she has uterine cancer she is consented for hysterectomy, omentectomy, node dissection, and biopsies. Her surgery will be more difficult due to prior surgical procedures, the possibility of adhesive disease is high given ruptured appendix, obesity, and diabetes.   Risks were discussed in detail. These include infection, anesthesia, bleeding, transfusion, wound separation, vaginal cuff dehiscence, medical issues (blood clots, stroke, heart attack, fluid  in the lungs, pneumonia, abnormal heart rhythm, death), possible exploratory surgery with larger incision, lymphedema, lymphocyst, allergic reaction, injury to adjacent organs (bowel, bladder, blood vessels, nerves, ureters, uterus)   Suggested return to Gynecologic Oncology clinic in  prn. If no evidence of malignancy she will follow up with Dr. Glennon Mac.    We will check labs today including PT/PTT given easy bruising and HbA1c.   The patient's diagnosis, an outline of the further diagnostic and laboratory studies which will be required, the recommendation, and alternatives were discussed.  All questions were answered to the patient's satisfaction.  A total of 120 minutes were spent with the patient/family today; 25% was spent in education, counseling and coordination of care for ovarian cyst and abnormal uterine bleeding.   Gillis Ends, MD    CC:  Prentice Docker, MD

## 2015-10-05 NOTE — Patient Instructions (Signed)
Abnormal Uterine Bleeding Abnormal uterine bleeding can affect women at various stages in life, including teenagers, women in their reproductive years, pregnant women, and women who have reached menopause. Several kinds of uterine bleeding are considered abnormal, including:  Bleeding or spotting between periods.   Bleeding after sexual intercourse.   Bleeding that is heavier or more than normal.   Periods that last longer than usual.  Bleeding after menopause.  Many cases of abnormal uterine bleeding are minor and simple to treat, while others are more serious. Any type of abnormal bleeding should be evaluated by your health care provider. Treatment will depend on the cause of the bleeding. HOME CARE INSTRUCTIONS Monitor your condition for any changes. The following actions may help to alleviate any discomfort you are experiencing:  Avoid the use of tampons and douches as directed by your health care provider.  Change your pads frequently. You should get regular pelvic exams and Pap tests. Keep all follow-up appointments for diagnostic tests as directed by your health care provider.  SEEK MEDICAL CARE IF:   Your bleeding lasts more than 1 week.   You feel dizzy at times.  SEEK IMMEDIATE MEDICAL CARE IF:   You pass out.   You are changing pads every 15 to 30 minutes.   You have abdominal pain.  You have a fever.   You become sweaty or weak.   You are passing large blood clots from the vagina.   You start to feel nauseous and vomit. MAKE SURE YOU:   Understand these instructions.  Will watch your condition.  Will get help right away if you are not doing well or get worse.   This information is not intended to replace advice given to you by your health care provider. Make sure you discuss any questions you have with your health care provider.   Document Released: 02/19/2005 Document Revised: 02/24/2013 Document Reviewed: 09/18/2012 Elsevier Interactive  Patient Education 2016 Elsevier Inc.   Ovarian Cyst An ovarian cyst is a fluid-filled sac that forms on an ovary. The ovaries are small organs that produce eggs in women. Various types of cysts can form on the ovaries. Most are not cancerous. Many do not cause problems, and they often go away on their own. Some may cause symptoms and require treatment. Common types of ovarian cysts include:  Functional cysts--These cysts may occur every month during the menstrual cycle. This is normal. The cysts usually go away with the next menstrual cycle if the woman does not get pregnant. Usually, there are no symptoms with a functional cyst.  Endometrioma cysts--These cysts form from the tissue that lines the uterus. They are also called "chocolate cysts" because they become filled with blood that turns brown. This type of cyst can cause pain in the lower abdomen during intercourse and with your menstrual period.  Cystadenoma cysts--This type develops from the cells on the outside of the ovary. These cysts can get very big and cause lower abdomen pain and pain with intercourse. This type of cyst can twist on itself, cut off its blood supply, and cause severe pain. It can also easily rupture and cause a lot of pain.  Dermoid cysts--This type of cyst is sometimes found in both ovaries. These cysts may contain different kinds of body tissue, such as skin, teeth, hair, or cartilage. They usually do not cause symptoms unless they get very big.  Theca lutein cysts--These cysts occur when too much of a certain hormone (human chorionic gonadotropin) is produced  and overstimulates the ovaries to produce an egg. This is most common after procedures used to assist with the conception of a baby (in vitro fertilization). CAUSES   Fertility drugs can cause a condition in which multiple large cysts are formed on the ovaries. This is called ovarian hyperstimulation syndrome.  A condition called polycystic ovary syndrome can  cause hormonal imbalances that can lead to nonfunctional ovarian cysts. SIGNS AND SYMPTOMS  Many ovarian cysts do not cause symptoms. If symptoms are present, they may include:  Pelvic pain or pressure.  Pain in the lower abdomen.  Pain during sexual intercourse.  Increasing girth (swelling) of the abdomen.  Abnormal menstrual periods.  Increasing pain with menstrual periods.  Stopping having menstrual periods without being pregnant. DIAGNOSIS  These cysts are commonly found during a routine or annual pelvic exam. Tests may be ordered to find out more about the cyst. These tests may include:  Ultrasound.  X-ray of the pelvis.  CT scan.  MRI.  Blood tests. TREATMENT  Many ovarian cysts go away on their own without treatment. Your health care provider may want to check your cyst regularly for 2-3 months to see if it changes. For women in menopause, it is particularly important to monitor a cyst closely because of the higher rate of ovarian cancer in menopausal women. When treatment is needed, it may include any of the following:  A procedure to drain the cyst (aspiration). This may be done using a long needle and ultrasound. It can also be done through a laparoscopic procedure. This involves using a thin, lighted tube with a tiny camera on the end (laparoscope) inserted through a small incision.  Surgery to remove the whole cyst. This may be done using laparoscopic surgery or an open surgery involving a larger incision in the lower abdomen.  Hormone treatment or birth control pills. These methods are sometimes used to help dissolve a cyst. HOME CARE INSTRUCTIONS   Only take over-the-counter or prescription medicines as directed by your health care provider.  Follow up with your health care provider as directed.  Get regular pelvic exams and Pap tests. SEEK MEDICAL CARE IF:   Your periods are late, irregular, or painful, or they stop.  Your pelvic pain or abdominal pain  does not go away.  Your abdomen becomes larger or swollen.  You have pressure on your bladder or trouble emptying your bladder completely.  You have pain during sexual intercourse.  You have feelings of fullness, pressure, or discomfort in your stomach.  You lose weight for no apparent reason.  You feel generally ill.  You become constipated.  You lose your appetite.  You develop acne.  You have an increase in body and facial hair.  You are gaining weight, without changing your exercise and eating habits.  You think you are pregnant. SEEK IMMEDIATE MEDICAL CARE IF:   You have increasing abdominal pain.  You feel sick to your stomach (nauseous), and you throw up (vomit).  You develop a fever that comes on suddenly.  You have abdominal pain during a bowel movement.  Your menstrual periods become heavier than usual. MAKE SURE YOU:  Understand these instructions.  Will watch your condition.  Will get help right away if you are not doing well or get worse.   This information is not intended to replace advice given to you by your health care provider. Make sure you discuss any questions you have with your health care provider.   Document Released: 02/19/2005  Document Revised: 02/24/2013 Document Reviewed: 10/27/2012 Elsevier Interactive Patient Education 2016 Elsevier Inc.   Cervical Biopsy A cervical biopsy is a procedure to remove a small sample of tissue from the cervix. The cervix is the lowest part of the womb (uterus), which opens into the vagina (birth canal). You may have this procedure to check for cancer or growths that may become cancer. This procedure may also be done if the results of your Pap test were abnormal or if your health care provider saw an abnormality during a pelvic exam. LET [facility] CARE PROVIDER KNOW ABOUT:   Any allergies you have.   All medicines you are taking, including vitamins, herbs, eye drops, creams, and over-the-counter  medicines.  Previous problems you or members of your family have had with the use of anesthetics.   Any blood disorders you have.  Previous surgeries you have had.  Any medical conditions you have.  Whether you are pregnant or may be pregnant.  Whether you are having your menstrual period or will be having your period at the time of the procedure. RISKS AND COMPLICATIONS Generally, this is a safe procedure. However, problems may occur, including:  Infection.  Bleeding.  Allergic reactions to medicines or dyes.  Damage to other structures or organs. BEFORE THE PROCEDURE  Do not douche, have sex, use tampons, or use any vaginal medicines before the procedure as told by your health care provider.  Follow instructions from your health care provider about eating or drinking restrictions.  Ask your health care provider about:  Changing or stopping your regular medicines. This is especially important if you are taking diabetes medicines or blood thinners.  Taking medicines such as aspirin and ibuprofen. These medicines can thin your blood. Do not take these medicines before your procedure if your health care provider instructs you not to.  You may be given an over-the-counter pain medicine to take right before the procedure.  You may be asked to empty your bladder and bowel right before the procedure. PROCEDURE   You will undress from the waist down.  You will lie on an examining table and put your feet in stirrups.  To reduce your risk of infection:  Your health care team will wash or sanitize their hands.  Your skin will be washed with soap.  Your health care provider will use a lubricated instrument (speculum) to open your vagina. An instrument that has a magnifying lens and a light (colposcope) will let your health care provider examine the cervix more closely.  You may be given a medicine to numb the area (local anesthetic).  Your health care provider will apply a  solution to your cervix. This turns abnormal areas a pale color.  Your health care provider will use an instrument (biopsy forceps) to take one or more small pieces of tissue that appear abnormal.  If there seems to be an abnormal area in the part of your cervix that leads to the uterus (endocervical canal), your health care provider will use an instrument (curette) to scrape tissue from that area. This is called endocervical curettage.  Your health care provider will apply a paste over the biopsy areas to help control bleeding. The procedure may vary among health care providers and hospitals. AFTER THE PROCEDURE It is your responsibility to get the results of your procedure. Ask your health care provider or the department performing the procedure when your results will be ready.   This information is not intended to replace advice given  to you by your health care provider. Make sure you discuss any questions you have with your health care provider.   Document Released: 02/19/2005 Document Revised: 11/10/2014 Document Reviewed: 07/07/2014 Elsevier Interactive Patient Education [employer].

## 2015-10-07 LAB — SURGICAL PATHOLOGY

## 2015-10-10 ENCOUNTER — Encounter
Admission: RE | Admit: 2015-10-10 | Discharge: 2015-10-10 | Disposition: A | Discharge status: Home / Self Care | Payer: 59 | Source: Ambulatory Visit | Attending: Obstetrics and Gynecology | Admitting: Obstetrics and Gynecology

## 2015-10-10 DIAGNOSIS — I10 Essential (primary) hypertension: Secondary | ICD-10-CM | POA: Diagnosis not present

## 2015-10-10 DIAGNOSIS — J45909 Unspecified asthma, uncomplicated: Secondary | ICD-10-CM | POA: Diagnosis not present

## 2015-10-10 DIAGNOSIS — N946 Dysmenorrhea, unspecified: Secondary | ICD-10-CM | POA: Diagnosis not present

## 2015-10-10 DIAGNOSIS — Z8249 Family history of ischemic heart disease and other diseases of the circulatory system: Secondary | ICD-10-CM | POA: Diagnosis not present

## 2015-10-10 DIAGNOSIS — E559 Vitamin D deficiency, unspecified: Secondary | ICD-10-CM | POA: Diagnosis not present

## 2015-10-10 DIAGNOSIS — Z7984 Long term (current) use of oral hypoglycemic drugs: Secondary | ICD-10-CM | POA: Diagnosis not present

## 2015-10-10 DIAGNOSIS — R251 Tremor, unspecified: Secondary | ICD-10-CM | POA: Diagnosis not present

## 2015-10-10 DIAGNOSIS — G252 Other specified forms of tremor: Secondary | ICD-10-CM | POA: Diagnosis not present

## 2015-10-10 DIAGNOSIS — N920 Excessive and frequent menstruation with regular cycle: Secondary | ICD-10-CM | POA: Diagnosis present

## 2015-10-10 DIAGNOSIS — M545 Low back pain: Secondary | ICD-10-CM | POA: Diagnosis not present

## 2015-10-10 DIAGNOSIS — Z8261 Family history of arthritis: Secondary | ICD-10-CM | POA: Diagnosis not present

## 2015-10-10 DIAGNOSIS — N83202 Unspecified ovarian cyst, left side: Secondary | ICD-10-CM

## 2015-10-10 DIAGNOSIS — Z833 Family history of diabetes mellitus: Secondary | ICD-10-CM | POA: Diagnosis not present

## 2015-10-10 DIAGNOSIS — N801 Endometriosis of ovary: Secondary | ICD-10-CM | POA: Diagnosis not present

## 2015-10-10 DIAGNOSIS — Z880 Allergy status to penicillin: Secondary | ICD-10-CM | POA: Diagnosis not present

## 2015-10-10 DIAGNOSIS — Z87891 Personal history of nicotine dependence: Secondary | ICD-10-CM | POA: Diagnosis not present

## 2015-10-10 DIAGNOSIS — E785 Hyperlipidemia, unspecified: Secondary | ICD-10-CM | POA: Diagnosis not present

## 2015-10-10 DIAGNOSIS — N7011 Chronic salpingitis: Secondary | ICD-10-CM | POA: Diagnosis not present

## 2015-10-10 DIAGNOSIS — E119 Type 2 diabetes mellitus without complications: Secondary | ICD-10-CM | POA: Diagnosis not present

## 2015-10-10 DIAGNOSIS — J309 Allergic rhinitis, unspecified: Secondary | ICD-10-CM | POA: Diagnosis not present

## 2015-10-10 DIAGNOSIS — K219 Gastro-esophageal reflux disease without esophagitis: Secondary | ICD-10-CM | POA: Diagnosis not present

## 2015-10-10 DIAGNOSIS — Z79899 Other long term (current) drug therapy: Secondary | ICD-10-CM | POA: Diagnosis not present

## 2015-10-10 DIAGNOSIS — Z8049 Family history of malignant neoplasm of other genital organs: Secondary | ICD-10-CM | POA: Diagnosis not present

## 2015-10-10 DIAGNOSIS — D270 Benign neoplasm of right ovary: Secondary | ICD-10-CM | POA: Diagnosis not present

## 2015-10-10 HISTORY — DX: Other complications of anesthesia, initial encounter: T88.59XA

## 2015-10-10 HISTORY — DX: Unspecified osteoarthritis, unspecified site: M19.90

## 2015-10-10 HISTORY — DX: Adverse effect of unspecified anesthetic, initial encounter: T41.45XA

## 2015-10-10 HISTORY — DX: Headache: R51

## 2015-10-10 HISTORY — DX: Headache, unspecified: R51.9

## 2015-10-10 LAB — PROTIME-INR
INR: 1.02
PROTHROMBIN TIME: 13.4 s (ref 11.4–15.2)

## 2015-10-10 LAB — CBC
HCT: 39.1 % (ref 35.0–47.0)
Hemoglobin: 13.5 g/dL (ref 12.0–16.0)
MCH: 26.8 pg (ref 26.0–34.0)
MCHC: 34.6 g/dL (ref 32.0–36.0)
MCV: 77.5 fL — AB (ref 80.0–100.0)
PLATELETS: 279 10*3/uL (ref 150–440)
RBC: 5.04 MIL/uL (ref 3.80–5.20)
RDW: 15.1 % — AB (ref 11.5–14.5)
WBC: 9.5 10*3/uL (ref 3.6–11.0)

## 2015-10-10 LAB — COMPREHENSIVE METABOLIC PANEL
ALBUMIN: 4 g/dL (ref 3.5–5.0)
ALT: 14 U/L (ref 14–54)
ANION GAP: 6 (ref 5–15)
AST: 14 U/L — AB (ref 15–41)
Alkaline Phosphatase: 62 U/L (ref 38–126)
BUN: 13 mg/dL (ref 6–20)
CHLORIDE: 105 mmol/L (ref 101–111)
CO2: 25 mmol/L (ref 22–32)
Calcium: 9 mg/dL (ref 8.9–10.3)
Creatinine, Ser: 0.67 mg/dL (ref 0.44–1.00)
GFR calc Af Amer: >60 mL/min (ref >60)
Glucose, Bld: 101 mg/dL — ABNORMAL HIGH (ref 65–99)
POTASSIUM: 4 mmol/L (ref 3.5–5.1)
Sodium: 136 mmol/L (ref 135–145)
Total Bilirubin: 0.8 mg/dL (ref 0.3–1.2)
Total Protein: 7.8 g/dL (ref 6.5–8.1)

## 2015-10-10 LAB — TYPE AND SCREEN
ABO/RH(D): AB POS
ANTIBODY SCREEN: NEGATIVE

## 2015-10-10 LAB — APTT: APTT: 28 s (ref 24–36)

## 2015-10-10 MED ORDER — HEMOGLOBIN A1C (HBA1C) TEST VI TEST
Freq: Once | Status: DC
  Filled 2015-10-10: qty 1

## 2015-10-10 NOTE — Patient Instructions (Signed)
  Your procedure is scheduled on: Wednesday October 12, 2015. Report to Same Day Surgery. To find out your arrival time please call [phone] between 1PM - 3PM on Tuesday October 11, 2015.  Remember: Instructions that are not followed completely may result in serious medical risk, up to and including death, or upon the discretion of your surgeon and anesthesiologist your surgery may need to be rescheduled.    _x___ 1. Do not eat food or drink liquids after midnight. No gum chewing or  hard candies.     _x__ 2. No Alcohol for 24 hours before or after surgery.   ____ 3. Bring all medications with you on the day of surgery if instructed.    __x__ 4. Notify your doctor if there is any change in your medical condition     (cold, fever, infections).     Do not wear jewelry, make-up, hairpins, clips or nail polish.  Do not wear lotions, powders, or perfumes. You may wear deodorant.  Do not shave 48 hours prior to surgery. Men may shave face and neck.  Do not bring valuables to the hospital.    [facility] is not responsible for any belongings or valuables.               Contacts, dentures or bridgework may not be worn into surgery.  Leave your suitcase in the car. After surgery it may be brought to your room.  For patients admitted to the hospital, discharge time is determined by your treatment team.   Patients discharged the day of surgery will not be allowed to drive home.    Please read over the following fact sheets that you were given:   [facility] Preparing for Surgery  _x___ Take these medicines the morning of surgery with A SIP OF WATER:    1. labetalol (NORMODYNE)  2. traMADol (ULTRAM) OPTIONAL    ____ Fleet Enema (as directed)   _x___ Use CHG Soap as directed on instruction sheet  _x___ Use inhalers on the day of surgery and bring to hospital day of surgery  _x__ Stop metformin 2 days prior to surgery    ____ Take 1/2 of usual insulin dose the night before surgery and  none on the morning of  surgery.   ____ Stop Coumadin/Plavix/aspirin on does not apply  _x___ Stop Anti-inflammatories such as Advil, Aleve, Ibuprofen, Motrin, Naproxen, Naprosyn, Goodies powders or aspirin products.OK to take Tylenol or Tramadol.   _x___ Stop supplements:Cholecalciferol (VITAMIN D)   until after surgery.    ____ Bring C-Pap to the hospital.

## 2015-10-11 NOTE — Pre-Procedure Instructions (Signed)
Called Maritza in lab asking about hemoglobin A1C result not being Epic.  Herb Grays researched the above, called me back and reports the lab did not receive an order for the hemoglobin A1C, they do have a tube of blood to run the test if order is entered.  Done.

## 2015-10-11 NOTE — Pre-Procedure Instructions (Signed)
Called Izora Gala at Dr. Doreene Adas office requesting an order for surgical consents listing procedure and reason for procedure.

## 2015-10-12 ENCOUNTER — Ambulatory Visit: Payer: 59 | Admitting: Anesthesiology

## 2015-10-12 ENCOUNTER — Encounter
Admission: RE | Disposition: A | Discharge status: Home / Self Care | Payer: Self-pay | Source: Ambulatory Visit | Attending: Obstetrics and Gynecology

## 2015-10-12 ENCOUNTER — Encounter: Payer: Self-pay | Admitting: Anesthesiology

## 2015-10-12 ENCOUNTER — Ambulatory Visit
Admission: RE | Admit: 2015-10-12 | Discharge: 2015-10-12 | Disposition: A | Discharge status: Home / Self Care | Payer: 59 | Source: Ambulatory Visit | Attending: Obstetrics and Gynecology | Admitting: Obstetrics and Gynecology

## 2015-10-12 DIAGNOSIS — N946 Dysmenorrhea, unspecified: Secondary | ICD-10-CM | POA: Insufficient documentation

## 2015-10-12 DIAGNOSIS — N7011 Chronic salpingitis: Secondary | ICD-10-CM | POA: Insufficient documentation

## 2015-10-12 DIAGNOSIS — G252 Other specified forms of tremor: Secondary | ICD-10-CM | POA: Insufficient documentation

## 2015-10-12 DIAGNOSIS — K219 Gastro-esophageal reflux disease without esophagitis: Secondary | ICD-10-CM | POA: Insufficient documentation

## 2015-10-12 DIAGNOSIS — Z87891 Personal history of nicotine dependence: Secondary | ICD-10-CM | POA: Insufficient documentation

## 2015-10-12 DIAGNOSIS — I10 Essential (primary) hypertension: Secondary | ICD-10-CM | POA: Insufficient documentation

## 2015-10-12 DIAGNOSIS — D270 Benign neoplasm of right ovary: Secondary | ICD-10-CM | POA: Insufficient documentation

## 2015-10-12 DIAGNOSIS — N801 Endometriosis of ovary: Secondary | ICD-10-CM | POA: Diagnosis not present

## 2015-10-12 DIAGNOSIS — Z79899 Other long term (current) drug therapy: Secondary | ICD-10-CM | POA: Insufficient documentation

## 2015-10-12 DIAGNOSIS — Z880 Allergy status to penicillin: Secondary | ICD-10-CM | POA: Insufficient documentation

## 2015-10-12 DIAGNOSIS — Z8261 Family history of arthritis: Secondary | ICD-10-CM | POA: Insufficient documentation

## 2015-10-12 DIAGNOSIS — E559 Vitamin D deficiency, unspecified: Secondary | ICD-10-CM | POA: Insufficient documentation

## 2015-10-12 DIAGNOSIS — J45909 Unspecified asthma, uncomplicated: Secondary | ICD-10-CM | POA: Insufficient documentation

## 2015-10-12 DIAGNOSIS — E785 Hyperlipidemia, unspecified: Secondary | ICD-10-CM | POA: Insufficient documentation

## 2015-10-12 DIAGNOSIS — R251 Tremor, unspecified: Secondary | ICD-10-CM | POA: Insufficient documentation

## 2015-10-12 DIAGNOSIS — J309 Allergic rhinitis, unspecified: Secondary | ICD-10-CM | POA: Insufficient documentation

## 2015-10-12 DIAGNOSIS — Z7984 Long term (current) use of oral hypoglycemic drugs: Secondary | ICD-10-CM | POA: Insufficient documentation

## 2015-10-12 DIAGNOSIS — N920 Excessive and frequent menstruation with regular cycle: Secondary | ICD-10-CM | POA: Insufficient documentation

## 2015-10-12 DIAGNOSIS — Z833 Family history of diabetes mellitus: Secondary | ICD-10-CM | POA: Insufficient documentation

## 2015-10-12 DIAGNOSIS — Z8049 Family history of malignant neoplasm of other genital organs: Secondary | ICD-10-CM | POA: Insufficient documentation

## 2015-10-12 DIAGNOSIS — N83202 Unspecified ovarian cyst, left side: Secondary | ICD-10-CM

## 2015-10-12 DIAGNOSIS — M545 Low back pain: Secondary | ICD-10-CM | POA: Insufficient documentation

## 2015-10-12 DIAGNOSIS — E119 Type 2 diabetes mellitus without complications: Secondary | ICD-10-CM | POA: Insufficient documentation

## 2015-10-12 DIAGNOSIS — N915 Oligomenorrhea, unspecified: Secondary | ICD-10-CM | POA: Diagnosis present

## 2015-10-12 DIAGNOSIS — Z8249 Family history of ischemic heart disease and other diseases of the circulatory system: Secondary | ICD-10-CM | POA: Insufficient documentation

## 2015-10-12 HISTORY — PX: LAPAROSCOPIC BILATERAL SALPINGO OOPHERECTOMY: SHX5890

## 2015-10-12 HISTORY — PX: HYSTEROSCOPY WITH D & C: SHX1775

## 2015-10-12 HISTORY — PX: LAPAROSCOPIC OVARIAN CYSTECTOMY: SHX6248

## 2015-10-12 LAB — GLUCOSE, CAPILLARY: Glucose-Capillary: 105 mg/dL — ABNORMAL HIGH (ref 65–99)

## 2015-10-12 LAB — POCT PREGNANCY, URINE: Preg Test, Ur: NEGATIVE

## 2015-10-12 SURGERY — SALPINGO-OOPHORECTOMY, BILATERAL, LAPAROSCOPIC
Anesthesia: General | Wound class: Clean Contaminated

## 2015-10-12 MED ORDER — OXYCODONE HCL 5 MG/5ML PO SOLN: 5.0000 mg | Freq: Once | ORAL | Status: DC | PRN

## 2015-10-12 MED ORDER — BUPIVACAINE HCL (PF) 0.5 % IJ SOLN
INTRAMUSCULAR | Status: AC
  Filled 2015-10-12: qty 30

## 2015-10-12 MED ORDER — ROCURONIUM BROMIDE 100 MG/10ML IV SOLN
INTRAVENOUS | Status: DC | PRN
  Administered 2015-10-12: 50 mg via INTRAVENOUS
  Administered 2015-10-12: 20 mg via INTRAVENOUS

## 2015-10-12 MED ORDER — SOD CITRATE-CITRIC ACID 500-334 MG/5ML PO SOLN
30.0000 mL | ORAL | Status: DC
  Filled 2015-10-12: qty 30

## 2015-10-12 MED ORDER — SILVER NITRATE-POT NITRATE 75-25 % EX MISC
CUTANEOUS | Status: AC
  Filled 2015-10-12: qty 4

## 2015-10-12 MED ORDER — PROMETHAZINE HCL 25 MG/ML IJ SOLN
12.5000 mg | Freq: Once | INTRAMUSCULAR | Status: AC
  Administered 2015-10-12: 16:00:00 via INTRAVENOUS

## 2015-10-12 MED ORDER — IPRATROPIUM-ALBUTEROL 0.5-2.5 (3) MG/3ML IN SOLN
3.0000 mL | Freq: Once | RESPIRATORY_TRACT | Status: AC
  Administered 2015-10-12: 3 mL via RESPIRATORY_TRACT

## 2015-10-12 MED ORDER — OXYCODONE-ACETAMINOPHEN 5-325 MG PO TABS: 2.0000 | ORAL_TABLET | Freq: Four times a day (QID) | ORAL | 0 refills | Status: DC | PRN

## 2015-10-12 MED ORDER — FENTANYL CITRATE (PF) 100 MCG/2ML IJ SOLN
INTRAMUSCULAR | Status: DC | PRN
  Administered 2015-10-12 (×6): 50 ug via INTRAVENOUS

## 2015-10-12 MED ORDER — FAMOTIDINE 20 MG PO TABS
20.0000 mg | ORAL_TABLET | Freq: Once | ORAL | Status: AC
  Administered 2015-10-12: 20 mg via ORAL

## 2015-10-12 MED ORDER — MIDAZOLAM HCL 2 MG/2ML IJ SOLN
INTRAMUSCULAR | Status: DC | PRN
  Administered 2015-10-12: 2 mg via INTRAVENOUS

## 2015-10-12 MED ORDER — CIPROFLOXACIN IN D5W 400 MG/200ML IV SOLN
400.0000 mg | INTRAVENOUS | Status: AC
  Administered 2015-10-12: 400 mg via INTRAVENOUS

## 2015-10-12 MED ORDER — IBUPROFEN 600 MG PO TABS: 600.0000 mg | ORAL_TABLET | Freq: Four times a day (QID) | ORAL | 0 refills | Status: DC | PRN

## 2015-10-12 MED ORDER — SUCCINYLCHOLINE CHLORIDE 20 MG/ML IJ SOLN
INTRAMUSCULAR | Status: DC | PRN
  Administered 2015-10-12: 100 mg via INTRAVENOUS

## 2015-10-12 MED ORDER — FAMOTIDINE 20 MG PO TABS
ORAL_TABLET | ORAL | Status: AC
  Filled 2015-10-12: qty 1

## 2015-10-12 MED ORDER — IPRATROPIUM-ALBUTEROL 0.5-2.5 (3) MG/3ML IN SOLN
RESPIRATORY_TRACT | Status: AC
  Filled 2015-10-12: qty 3

## 2015-10-12 MED ORDER — BUPIVACAINE HCL (PF) 0.5 % IJ SOLN
INTRAMUSCULAR | Status: DC | PRN
  Administered 2015-10-12: 16 mL

## 2015-10-12 MED ORDER — CIPROFLOXACIN IN D5W 400 MG/200ML IV SOLN
INTRAVENOUS | Status: AC
  Filled 2015-10-12: qty 200

## 2015-10-12 MED ORDER — SUGAMMADEX SODIUM 200 MG/2ML IV SOLN
INTRAVENOUS | Status: DC | PRN
  Administered 2015-10-12: 240 mg via INTRAVENOUS

## 2015-10-12 MED ORDER — LABETALOL HCL 5 MG/ML IV SOLN
INTRAVENOUS | Status: DC | PRN
  Administered 2015-10-12: 10 mg via INTRAVENOUS

## 2015-10-12 MED ORDER — GLYCOPYRROLATE 0.2 MG/ML IJ SOLN
INTRAMUSCULAR | Status: DC | PRN
  Administered 2015-10-12: 0.2 mg via INTRAVENOUS

## 2015-10-12 MED ORDER — EPHEDRINE SULFATE 50 MG/ML IJ SOLN
INTRAMUSCULAR | Status: DC | PRN
  Administered 2015-10-12: 10 mg via INTRAVENOUS

## 2015-10-12 MED ORDER — METHYLENE BLUE 0.5 % INJ SOLN
INTRAVENOUS | Status: AC
  Filled 2015-10-12: qty 10

## 2015-10-12 MED ORDER — ONDANSETRON HCL 4 MG/2ML IJ SOLN
INTRAMUSCULAR | Status: DC | PRN
  Administered 2015-10-12: 4 mg via INTRAVENOUS

## 2015-10-12 MED ORDER — SODIUM CHLORIDE 0.9 % IV SOLN
INTRAVENOUS | Status: DC
  Administered 2015-10-12 (×2): via INTRAVENOUS

## 2015-10-12 MED ORDER — PROPOFOL 10 MG/ML IV BOLUS
INTRAVENOUS | Status: DC | PRN
  Administered 2015-10-12: 200 mg via INTRAVENOUS

## 2015-10-12 MED ORDER — PROMETHAZINE HCL 25 MG/ML IJ SOLN
INTRAMUSCULAR | Status: AC
  Filled 2015-10-12: qty 1

## 2015-10-12 MED ORDER — PHENYLEPHRINE HCL 10 MG/ML IJ SOLN
INTRAMUSCULAR | Status: DC | PRN
Start: 2015-10-12 — End: 2015-10-12
  Administered 2015-10-12: 100 ug via INTRAVENOUS
  Administered 2015-10-12: 200 ug via INTRAVENOUS
  Administered 2015-10-12 (×3): 100 ug via INTRAVENOUS

## 2015-10-12 MED ORDER — LACTATED RINGERS IV SOLN: INTRAVENOUS | Status: DC

## 2015-10-12 MED ORDER — METRONIDAZOLE IN NACL 5-0.79 MG/ML-% IV SOLN
500.0000 mg | INTRAVENOUS | Status: AC
  Administered 2015-10-12: 500 mg via INTRAVENOUS
  Filled 2015-10-12: qty 100

## 2015-10-12 MED ORDER — LIDOCAINE HCL (CARDIAC) 20 MG/ML IV SOLN
INTRAVENOUS | Status: DC | PRN
  Administered 2015-10-12: 100 mg via INTRAVENOUS

## 2015-10-12 MED ORDER — FENTANYL CITRATE (PF) 100 MCG/2ML IJ SOLN
INTRAMUSCULAR | Status: AC
  Administered 2015-10-12: 25 ug via INTRAVENOUS
  Filled 2015-10-12: qty 2

## 2015-10-12 MED ORDER — OXYCODONE HCL 5 MG PO TABS: 5.0000 mg | ORAL_TABLET | Freq: Once | ORAL | Status: DC | PRN

## 2015-10-12 MED ORDER — FENTANYL CITRATE (PF) 100 MCG/2ML IJ SOLN
25.0000 ug | INTRAMUSCULAR | Status: DC | PRN
  Administered 2015-10-12 (×4): 25 ug via INTRAVENOUS

## 2015-10-12 MED ORDER — DEXAMETHASONE SODIUM PHOSPHATE 10 MG/ML IJ SOLN
INTRAMUSCULAR | Status: DC | PRN
  Administered 2015-10-12: 10 mg via INTRAVENOUS

## 2015-10-12 SURGICAL SUPPLY — 84 items
BAG URO DRAIN 2000ML W/SPOUT (MISCELLANEOUS) ×3 IMPLANT
BLADE SURG SZ11 CARB STEEL (BLADE) ×3 IMPLANT
CANISTER SUCT 1200ML W/VALVE (MISCELLANEOUS) ×3 IMPLANT
CANISTER SUCT 3000ML (MISCELLANEOUS) ×3 IMPLANT
CATH FOL 2WAY LX 16X5 (CATHETERS) ×3 IMPLANT
CATH ROBINSON RED A/P 16FR (CATHETERS) IMPLANT
CATH TRAY 16F METER LATEX (MISCELLANEOUS) IMPLANT
CHLORAPREP W/TINT 26ML (MISCELLANEOUS) ×3 IMPLANT
CNTNR SPEC 2.5X3XGRAD LEK (MISCELLANEOUS) ×4
CONT SPEC 4OZ STER OR WHT (MISCELLANEOUS) ×2
CONTAINER SPEC 2.5X3XGRAD LEK (MISCELLANEOUS) ×4 IMPLANT
DRAPE INCISE 23X17 IOBAN STRL (DRAPES) ×1
DRAPE INCISE IOBAN 23X17 STRL (DRAPES) ×2 IMPLANT
DRAPE LAPAROTOMY 100X77 ABD (DRAPES) ×3 IMPLANT
DRAPE LAPAROTOMY TRNSV 106X77 (MISCELLANEOUS) IMPLANT
DRAPE LEGGINS SURG 28X43 STRL (DRAPES) ×3 IMPLANT
DRAPE UNDER BUTTOCK W/FLU (DRAPES) ×3 IMPLANT
DRESSING TELFA 4X3 1S ST N-ADH (GAUZE/BANDAGES/DRESSINGS) ×3 IMPLANT
DRSG TEGADERM 2-3/8X2-3/4 SM (GAUZE/BANDAGES/DRESSINGS) ×12 IMPLANT
DRSG TELFA 3X8 NADH (GAUZE/BANDAGES/DRESSINGS) ×3 IMPLANT
ELECT BLADE 6 FLAT ULTRCLN (ELECTRODE) IMPLANT
ELECT CAUTERY BLADE 6.4 (BLADE) IMPLANT
ELECT REM PT RETURN 9FT ADLT (ELECTROSURGICAL) ×3
ELECT RESECT POWERBALL 24F (MISCELLANEOUS) IMPLANT
ELECTRODE REM PT RTRN 9FT ADLT (ELECTROSURGICAL) ×2 IMPLANT
GAUZE SPONGE 4X4 12PLY STRL (GAUZE/BANDAGES/DRESSINGS) IMPLANT
GLOVE BIO SURGEON STRL SZ7 (GLOVE) ×15 IMPLANT
GLOVE BIO SURGEON STRL SZ8 (GLOVE) ×3 IMPLANT
GLOVE BIOGEL PI IND STRL 7.5 (GLOVE) ×10 IMPLANT
GLOVE BIOGEL PI INDICATOR 7.5 (GLOVE) ×5
GOWN STRL REUS W/ TWL LRG LVL3 (GOWN DISPOSABLE) ×6 IMPLANT
GOWN STRL REUS W/ TWL XL LVL3 (GOWN DISPOSABLE) ×2 IMPLANT
GOWN STRL REUS W/TWL LRG LVL3 (GOWN DISPOSABLE) ×3
GOWN STRL REUS W/TWL XL LVL3 (GOWN DISPOSABLE) ×1
IRRIGATION STRYKERFLOW (MISCELLANEOUS) ×2 IMPLANT
IRRIGATOR STRYKERFLOW (MISCELLANEOUS) ×3
IV LACTATED RINGERS 1000ML (IV SOLUTION) ×6 IMPLANT
KIT RM TURNOVER CYSTO AR (KITS) ×3 IMPLANT
KIT RM TURNOVER STRD PROC AR (KITS) ×3 IMPLANT
LABEL OR SOLS (LABEL) ×3 IMPLANT
LIGASURE BLUNT 5MM 37CM (INSTRUMENTS) ×3 IMPLANT
LIQUID BAND (GAUZE/BANDAGES/DRESSINGS) ×3 IMPLANT
LOOP CUT RT ANGL 28F (MISCELLANEOUS) IMPLANT
NDL HPO THNWL 1X22GA REG BVL (NEEDLE) ×2 IMPLANT
NDL INSUFF ACCESS 14 VERSASTEP (NEEDLE) ×3 IMPLANT
NEEDLE HYPO 25GX1X1/2 BEV (NEEDLE) ×3 IMPLANT
NEEDLE SAFETY 22GX1 (NEEDLE) ×1
NS IRRIG 1000ML POUR BTL (IV SOLUTION) IMPLANT
NS IRRIG 500ML POUR BTL (IV SOLUTION) ×3 IMPLANT
PACK BASIN MAJOR ARMC (MISCELLANEOUS) IMPLANT
PACK DNC HYST (MISCELLANEOUS) IMPLANT
PACK LAP CHOLECYSTECTOMY (MISCELLANEOUS) ×3 IMPLANT
PAD OB MATERNITY 4.3X12.25 (PERSONAL CARE ITEMS) ×3 IMPLANT
PAD PREP 24X41 OB/GYN DISP (PERSONAL CARE ITEMS) ×3 IMPLANT
POUCH ENDO CATCH 10MM SPEC (MISCELLANEOUS) ×3 IMPLANT
SCISSORS METZENBAUM CVD 33 (INSTRUMENTS) ×3 IMPLANT
SOL PREP PVP 2OZ (MISCELLANEOUS) ×3
SOLUTION PREP PVP 2OZ (MISCELLANEOUS) ×2 IMPLANT
SPONGE LAP 18X18 5 PK (GAUZE/BANDAGES/DRESSINGS) IMPLANT
STAPLER SKIN PROX 35W (STAPLE) IMPLANT
STRIP CLOSURE SKIN 1/2X4 (GAUZE/BANDAGES/DRESSINGS) ×3 IMPLANT
SURGILUBE 2OZ TUBE FLIPTOP (MISCELLANEOUS) ×3 IMPLANT
SUT ETHIBOND 0 (SUTURE) IMPLANT
SUT MAXON ABS #0 GS21 30IN (SUTURE) IMPLANT
SUT MNCRL 3-0 UNDYED SH (SUTURE) IMPLANT
SUT MNCRL 4-0 (SUTURE) ×2
SUT MNCRL 4-0 27XMFL (SUTURE) ×4
SUT MONOCRYL 3-0 UNDYED (SUTURE)
SUT VIC AB 0 CT1 27 (SUTURE)
SUT VIC AB 0 CT1 27XCR 8 STRN (SUTURE) IMPLANT
SUT VIC AB 0 CT1 36 (SUTURE) IMPLANT
SUT VIC AB 2-0 UR6 27 (SUTURE) IMPLANT
SUT VIC AB 4-0 PS2 18 (SUTURE) ×3 IMPLANT
SUT VICRYL PLUS ABS 0 54 (SUTURE) IMPLANT
SUTURE MNCRL 4-0 27XMF (SUTURE) ×4 IMPLANT
SYR 50ML LL SCALE MARK (SYRINGE) ×3 IMPLANT
SYR CONTROL 10ML (SYRINGE) ×3 IMPLANT
TOWEL OR 17X26 4PK STRL BLUE (TOWEL DISPOSABLE) ×6 IMPLANT
TRAY PREP VAG/GEN (MISCELLANEOUS) IMPLANT
TROCAR BLUNT TIP 12MM OMST12BT (TROCAR) ×3 IMPLANT
TROCAR ENDO BLADELESS 11MM (ENDOMECHANICALS) IMPLANT
TROCAR VERSASTEP PLUS 5MM (TROCAR) ×9 IMPLANT
TUBING CONNECTING 10 (TUBING) ×3 IMPLANT
TUBING INSUFFLATOR HI FLOW (MISCELLANEOUS) ×3 IMPLANT

## 2015-10-12 NOTE — Op Note (Signed)
Operative Note    Pre-Op Diagnosis:  1) Left ovarian cyst 2) menorrhagia with regular cycle  Post-Op Diagnosis:  1) Left ovarian cyst 2) menorrhagia with regular cycle  Procedures:  1. Hysteroscopy, dilation and curettage 2. Operative laparoscopy 3) left oophorectomy 4) left ovarian cystectomy 5) right salpingectomy 6) right ovarian cystectomy  Primary Surgeon: Prentice Docker, MD   Assistant Surgeon: Mellody Drown, MD  EBL: 50 mL   IVF: 1,000 mL   Urine output: 300 mL clear urine at the end of the case  Specimens:  1) endometrial curettings 2) left ovary with cyst 3) right fallopian tube 4) right ovarian cyst  Drains: None  Complications: None   Disposition: PACU   Condition: Stable   Findings:  1) normal appearing cervix 2) shaggy endometrium 3) omental adhesions to anterior abdominal wall 4) multiple cysts on left ovary adherent to left uterine wall with chocolate brown fluid 5) left uterine corpus fibroid 6) absent left fallopian tube 7) right hydrosalpinx 8) simple-appearing right ovarian cyst  Procedure Summary:  The patient was taken to the operating room where general anesthesia was administered and found to be adequate. She was placed in the dorsal supine lithotomy position in [place] stirrups and prepped and draped in usual sterile fashion. After a timeout was called an indwelling catheter was placed in her bladder. A sterile speculum was placed in the vagina and a single-tooth tenaculum was used to grasp the anterior lip of the cervix. The cervix was dilated in a serial fashion to 104mm.  The hysteroscope was gently introduced through the cervix into the uterine cavity with the above-noted findings.  The hysteroscope was removed and a blunt curette was used to obtain endometrial sampling.  Hemostasis was obtained and the single-tooth tenaculum was removed and a Hulka tenaculum was affixed to the cervix.  Attention was turned to the abdomen where  after injection of local anesthetic, a 10 mm infraumbilical incision was made with the scalpel. Entry into the abdomen was obtained via a open entry technique.  The 10 mm trocar was placed without difficulty.  The abdomen was insufflated with CO2. The camera was introduced through the trocar with verification of atraumatic entry.  A left 70mm step port was placed under direct, intra-abdominal camera visualization.  The same procedure was carried out on the left lower quadrant and 3cm above the pubic bone in the midline. The omental adhesions were removed.  A survey of the abdomen and pelvis were undertaken with the above-noted findings.  The right fallopian tube was removed along the mesosalpinx using the Ligasure in a lateral to medial fashion.  The left IP ligament was identified and skeletonized with careful attention to the left ureter which was identified and displaced medially.  The IP ligament was sealed with bipolar electrocautery with the Ligasure and transection with hemostasis assured.  The pedicle was further developed medially along the broad ligament.  The left ovary was adherent to the left uterine wall. The was dissected using the ligasure and with careful attention to the left ureter to avoid injury the left ovary was liberated from the pedicle. Hemostasis was present.  The right ovary had a simple ovarian cyst that was about 1.5cm that was removed from the ovary with the ligasure as it had a narrow attachment to the ovary.  Hemostasis verified.  The camera was moved to a lateral port and the EndoCatch bag was introduced through the A999333 umbilical port.  The specimen was placed in the endocatch bag  and removed.  The cyst ruptured and a "chocolate brown" fluid was noted.  The A999333 umbilical port was replaced.  The pelvis was irrigated copiously and with low abdominal pressure the operative pedicles were hemostatic.  The procedure was terminated at this point. The 33mm port sites were removed. The A999333  umbilical port was removed and the fascia was reapproximated with a 2-0 vicryl in a figure-of-eight stitch.  The skin of the umbilical incision was reapproximated using a subcuticular stitch and all port sites were closed with surgical skin glue.    The Hulk clamp was removed under speculum visualization with hemostasis.  The foley catheter was removed.  The vagina was swept to verify no remaining instruments or sponges.  The patient tolerated the procedure well.  Sponge, lap, needle, and instrument counts were correct x 2.  VTE prophylaxis: SCDs. Antibiotic prophylaxis: ciprofloxacin and flagyl. She was awakened in the operating room and was taken to the PACU in stable condition.   Note: frozen pathology on endometrial curettings and the ovarian and fallopian tubes was benign.   Prentice Docker, MD 10/12/2015 1:09 PM

## 2015-10-12 NOTE — H&P (Signed)
History and Physical Interval Note:  Kelsey Alvarez  has presented today for surgery, with the diagnosis of CYST OF LEFT OVARY, ABNORMAL UTERINE BLEEDING  The various methods of treatment have been discussed with the patient and family. After consideration of risks, benefits and other options for treatment, the patient has consented to  Procedure(s): LAPAROSCOPIC BILATERAL SALPINGECTOMY, LEFT OOPHORECTOMY (Bilateral) DILATATION AND CURETTAGE /HYSTEROSCOPY, POLYP REMOVAL (N/A) POSSIBLE OMENTECTOMY, NODE DISSECTION/BIOPSIES, BOWEL RESECTION (N/A) POSSIBLE HYSTERECTOMY (N/A) SENTINEL NODE INJECTION/BIOPSY (N/A) as a surgical intervention .  The patient's history has been reviewed, patient examined, no change in status, stable for surgery.  I have reviewed the patient's chart and labs.  Questions were answered to the patient's satisfaction.    Will Bonnet, MD 10/12/2015 10:30 AM

## 2015-10-12 NOTE — Anesthesia Preprocedure Evaluation (Signed)
Anesthesia Evaluation  Patient identified by MRN, date of birth, ID band Patient awake    Reviewed: Allergy & Precautions, H&P , NPO status , Patient's Chart, lab work & pertinent test results  History of Anesthesia Complications (+) PROLONGED EMERGENCE and history of anesthetic complications  Airway Mallampati: II  TM Distance: >3 FB Neck ROM: full    Dental  (+) Poor Dentition, Chipped, Missing   Pulmonary neg shortness of breath, asthma , former smoker,    Pulmonary exam normal breath sounds clear to auscultation       Cardiovascular Exercise Tolerance: Good hypertension, (-) angina(-) Past MI Normal cardiovascular exam Rhythm:regular Rate:Normal     Neuro/Psych  Headaches,  Neuromuscular disease negative psych ROS   GI/Hepatic Neg liver ROS, GERD  Controlled,  Endo/Other  negative endocrine ROSdiabetes, Type 2  Renal/GU negative Renal ROS  negative genitourinary   Musculoskeletal  (+) Arthritis ,   Abdominal   Peds  Hematology negative hematology ROS (+)   Anesthesia Other Findings Past Medical History: No date: Abnormal CBC No date: Allergy No date: Arthritis     Comment: left knee No date: Asthma No date: Cervical polyp No date: Chronic foot pain No date: Complication of anesthesia     Comment: takes longer to wake up. 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 No date: GERD (gastroesophageal reflux disease) No date: Headache     Comment: history of migraines No date: Hyperlipidemia No date: Hypertension No date: Lumbago No date: Obesity No date: Occasional tremors  Past Surgical History: No date: APPENDECTOMY No date: cervical polyp removal No date: DILATION AND CURETTAGE OF UTERUS No date: HYSTEROSCOPY No date: OOPHORECTOMY No date: OVARIAN CYST REMOVAL     Reproductive/Obstetrics negative OB ROS                             Anesthesia Physical Anesthesia Plan  ASA: III  Anesthesia Plan: General ETT   Post-op Pain Management:    Induction:   Airway Management Planned:   Additional Equipment:   Intra-op Plan:   Post-operative Plan:   Informed Consent: I have reviewed the patients History and Physical, chart, labs and discussed the procedure including the risks, benefits and alternatives for the proposed anesthesia with the patient or authorized representative who has indicated his/her understanding and acceptance.   Dental Advisory Given  Plan Discussed with: Anesthesiologist, CRNA and Surgeon  Anesthesia Plan Comments:         Anesthesia Quick Evaluation

## 2015-10-12 NOTE — Anesthesia Procedure Notes (Signed)
Procedure Name: Intubation Date/Time: 10/12/2015 11:09 AM Performed by: Silvana Newness Pre-anesthesia Checklist: Patient identified, Emergency Drugs available, Suction available, Patient being monitored and Timeout performed Patient Re-evaluated:Patient Re-evaluated prior to inductionOxygen Delivery Method: Circle system utilized Preoxygenation: Pre-oxygenation with 100% oxygen Intubation Type: IV induction Ventilation: Mask ventilation without difficulty Laryngoscope Size: Mac and 3 Grade View: Grade I Tube type: Oral Tube size: 7.0 mm Number of attempts: 1 Airway Equipment and Method: Rigid stylet and Patient positioned with wedge pillow Placement Confirmation: ETT inserted through vocal cords under direct vision,  positive ETCO2 and breath sounds checked- equal and bilateral Secured at: 20 cm Tube secured with: Tape Dental Injury: Teeth and Oropharynx as per pre-operative assessment

## 2015-10-12 NOTE — Transfer of Care (Signed)
Immediate Anesthesia Transfer of Care Note  Patient: Kelsey Alvarez  Procedure(s) Performed: Procedure(s): LAPAROSCOPIC RIGHT SALPINGECTOMY, LEFT OOPHORECTOMY (N/A) DILATATION AND CURETTAGE /HYSTEROSCOPY (N/A) LAPAROSCOPIC OVARIAN CYSTECTOMY (Bilateral)  Patient Location: PACU  Anesthesia Type:General  Level of Consciousness: awake, alert , oriented and patient cooperative  Airway & Oxygen Therapy: Patient Spontanous Breathing and Patient connected to face mask oxygen  Post-op Assessment: Report given to RN, Post -op Vital signs reviewed and stable and Patient moving all extremities X 4  Post vital signs: Reviewed and stable  Last Vitals:  Vitals:   10/12/15 0958 10/12/15 1317  BP: (!) 148/76 (!) 157/78  Pulse: 60 77  Resp: 18 16  Temp: 37.1 C 37.2 C    Last Pain:  Vitals:   10/12/15 0958  TempSrc: Oral         Complications: No apparent anesthesia complications

## 2015-10-12 NOTE — Discharge Instructions (Signed)
AMBULATORY SURGERY  DISCHARGE INSTRUCTIONS   1) The drugs that you were given will stay in your system until tomorrow so for the next 24 hours you should not:  A) Drive an automobile B) Make any legal decisions C) Drink any alcoholic beverage   2) You may resume regular meals tomorrow.  Today it is better to start with liquids and gradually work up to solid foods.  You may eat anything you prefer, but it is better to start with liquids, then soup and crackers, and gradually work up to solid foods.   3) Please notify your doctor immediately if you have any unusual bleeding, trouble breathing, redness and pain at the surgery site, drainage, fever, or pain not relieved by medication.   4) Additional Instructions: start a stool softener    Please contact your physician with any problems or Same Day Surgery at [phone], Monday through Friday 6 am to 4 pm, or [place] [facility] number at [phone].

## 2015-10-12 NOTE — Anesthesia Postprocedure Evaluation (Signed)
Anesthesia Post Note  Patient: Kelsey Alvarez  Procedure(s) Performed: Procedure(s) (LRB): LAPAROSCOPIC RIGHT SALPINGECTOMY, LEFT OOPHORECTOMY (N/A) DILATATION AND CURETTAGE /HYSTEROSCOPY (N/A) LAPAROSCOPIC OVARIAN CYSTECTOMY (Bilateral)  Patient location during evaluation: PACU Anesthesia Type: General Level of consciousness: awake and alert Pain management: pain level controlled Vital Signs Assessment: post-procedure vital signs reviewed and stable Respiratory status: spontaneous breathing, nonlabored ventilation, respiratory function stable and patient connected to nasal cannula oxygen Cardiovascular status: blood pressure returned to baseline and stable Postop Assessment: no signs of nausea or vomiting Anesthetic complications: no    Last Vitals:  Vitals:   10/12/15 1417 10/12/15 1442  BP: (!) 124/56 132/67  Pulse: 72 (!) 57  Resp: 13 14  Temp: 36.6 C 37.2 C    Last Pain:  Vitals:   10/12/15 1442  TempSrc: Tympanic  PainSc: 6                  Broadus John K Piscitello

## 2015-10-13 ENCOUNTER — Encounter: Payer: Self-pay | Admitting: Obstetrics and Gynecology

## 2015-10-13 LAB — SURGICAL PATHOLOGY

## 2015-10-31 ENCOUNTER — Other Ambulatory Visit: Payer: Self-pay | Admitting: Family Medicine

## 2015-11-01 NOTE — Telephone Encounter (Signed)
Last seen 03/28/15 last filled 07/25/15 #90 0 refills

## 2015-11-15 ENCOUNTER — Telehealth: Payer: Self-pay | Admitting: Family Medicine

## 2015-11-15 NOTE — Telephone Encounter (Signed)
She needs to be seen, I can open a slot for her tomorrow. She keeps scheduling and cancelling appointments

## 2015-11-16 NOTE — Telephone Encounter (Signed)
PT [facility] Thursday THE 14TH

## 2015-11-17 ENCOUNTER — Ambulatory Visit (INDEPENDENT_AMBULATORY_CARE_PROVIDER_SITE_OTHER): Payer: 59 | Admitting: Family Medicine

## 2015-11-17 ENCOUNTER — Encounter: Payer: Self-pay | Admitting: Family Medicine

## 2015-11-17 VITALS — BP 130/92 | HR 80 | Temp 98.7°F | Resp 16 | Ht 62.0 in | Wt 258.0 lb

## 2015-11-17 DIAGNOSIS — E1129 Type 2 diabetes mellitus with other diabetic kidney complication: Secondary | ICD-10-CM | POA: Diagnosis not present

## 2015-11-17 DIAGNOSIS — I10 Essential (primary) hypertension: Secondary | ICD-10-CM

## 2015-11-17 DIAGNOSIS — E785 Hyperlipidemia, unspecified: Secondary | ICD-10-CM | POA: Diagnosis not present

## 2015-11-17 DIAGNOSIS — R109 Unspecified abdominal pain: Secondary | ICD-10-CM

## 2015-11-17 DIAGNOSIS — Z8742 Personal history of other diseases of the female genital tract: Secondary | ICD-10-CM

## 2015-11-17 DIAGNOSIS — R350 Frequency of micturition: Secondary | ICD-10-CM | POA: Diagnosis not present

## 2015-11-17 DIAGNOSIS — Z23 Encounter for immunization: Secondary | ICD-10-CM

## 2015-11-17 DIAGNOSIS — R809 Proteinuria, unspecified: Secondary | ICD-10-CM | POA: Diagnosis not present

## 2015-11-17 DIAGNOSIS — J4540 Moderate persistent asthma, uncomplicated: Secondary | ICD-10-CM

## 2015-11-17 LAB — POCT GLYCOSYLATED HEMOGLOBIN (HGB A1C): Hemoglobin A1C: 5.9

## 2015-11-17 LAB — POCT UA - MICROALBUMIN: MICROALBUMIN (UR) POC: 50 mg/L

## 2015-11-17 LAB — POCT URINALYSIS DIPSTICK
Bilirubin, UA: NEGATIVE
Glucose, UA: NEGATIVE
Ketones, UA: NEGATIVE
Leukocytes, UA: NEGATIVE
NITRITE UA: NEGATIVE
PROTEIN UA: NEGATIVE
RBC UA: NEGATIVE
SPEC GRAV UA: 1.01
UROBILINOGEN UA: NEGATIVE
pH, UA: 5

## 2015-11-17 LAB — GLUCOSE, POCT (MANUAL RESULT ENTRY): POC Glucose: 124 mg/dl — AB (ref 70–99)

## 2015-11-17 MED ORDER — INSULIN DEGLUDEC-LIRAGLUTIDE 100-3.6 UNIT-MG/ML ~~LOC~~ SOPN
16.0000 [IU] | PEN_INJECTOR | Freq: Every day | SUBCUTANEOUS | 0 refills | Status: DC
Start: 2015-11-17 — End: 2015-11-17

## 2015-11-17 MED ORDER — MONTELUKAST SODIUM 10 MG PO TABS: 10.0000 mg | ORAL_TABLET | Freq: Every day | ORAL | 0 refills | Status: DC

## 2015-11-17 MED ORDER — METFORMIN HCL 500 MG PO TABS: 500.0000 mg | ORAL_TABLET | Freq: Every evening | ORAL | 2 refills | Status: DC

## 2015-11-17 MED ORDER — VALSARTAN-HYDROCHLOROTHIAZIDE 160-12.5 MG PO TABS: 1.0000 | ORAL_TABLET | Freq: Every day | ORAL | 2 refills | Status: DC

## 2015-11-17 MED ORDER — MOMETASONE FURO-FORMOTEROL FUM 100-5 MCG/ACT IN AERO: 2.0000 | INHALATION_SPRAY | Freq: Two times a day (BID) | RESPIRATORY_TRACT | 2 refills | Status: DC

## 2015-11-17 MED ORDER — ALBUTEROL SULFATE HFA 108 (90 BASE) MCG/ACT IN AERS: 2.0000 | INHALATION_SPRAY | Freq: Four times a day (QID) | RESPIRATORY_TRACT | 0 refills | Status: DC | PRN

## 2015-11-17 MED ORDER — CIPROFLOXACIN HCL 250 MG PO TABS: 250.0000 mg | ORAL_TABLET | Freq: Two times a day (BID) | ORAL | 0 refills | Status: DC

## 2015-11-17 NOTE — Progress Notes (Signed)
Name: Kelsey Alvarez   MRN: [id number]    DOB: 1971/02/17   Date:11/17/2015       Progress Note  Subjective  Chief Complaint  Chief Complaint  Patient presents with  . Medication Refill    7 month F/U-Medication has to be 30 day supplies  . Hypertension    Checks BP outside of office and gets 145/80's to 124/80's  . Diabetes    Checks BS couple of times a week Low-96 Average-106 High -140  . Asthma    SOB due to weather change  . Hyperlipidemia    Does not take medications due to headaches  . Abdominal Cramping    Patient wanted her urine checked due to discomfort does not know if it is coming from the surgery or from a UTI.    HPI  Hyperlipidemia: off medication, she is due for repeat labs, she states statin therapy caused headache  Morbid Obesity: weight has been stable, she is still drinking sodas but down to 16 ounces daily, still likes lemonades, but is drinking more water. She has been eating 5 servings of fruit and vegetables per day, baking more.   Endometriosis: seen by Dr. Glennon Mac and had a cyst left ovary, s/p left oophorectomy and bilateral salpingectomy - pathology showed endometriosis. She did well post-op ( surgery 10/2015 ). She also had a D&C for dysfuncional bleeding  Urinary frequency: she has noticed urinary frequency, supra-pubic pain and incomplete void, she denies fever or chills.   DM: glucose at home has been between 96-106, occasionally goes up to 140. We checked hgbA1C at our office and initially it was 11%, but we rechecked and it was 5.9% - which matches her readings at home and during hospital say.   She has noticed some polyphagia, polydipsia and polyuria ( after surgery, but she states her appetite was poor to begin with). She is  due for labs and eye exam. She has been off Metformin for a few weeks.  Urine micro is elevated and since she can't have children we will add ARB, we will resume medication  Asthma Moderate persistent: symptoms are better  controlled, she is still using Dulera once daliy, and denies wheezing or cough, but has mild SOB with change in the weather.   HTN: she has been out of medication and bp is elevated today, only took HCTZ, no chest pain or palpitation   Patient Active Problem List   Diagnosis Date Noted  . History of endometriosis 11/17/2015  . Menorrhagia with regular cycle 10/12/2015  . Benign essential HTN 12/28/2014  . Cervical polyp 12/28/2014  . 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 Dyslipidemia 12/28/2014  . Dysmenorrhea 12/28/2014  . Edema 12/28/2014  . Female infertility 12/28/2014  . Gastro-esophageal reflux disease without esophagitis 12/28/2014  . Morbid obesity ([place]) 12/28/2014  . Asthma, moderate persistent, poorly-controlled 12/28/2014  . Perennial allergic rhinitis 12/28/2014  . Intermittent tremor 12/28/2014  . Vitamin D deficiency 12/28/2014  . LBP (low back pain) 12/28/2014  . Left sciatic nerve pain 12/28/2014    Past Surgical History:  Procedure Laterality Date  . APPENDECTOMY    . cervical polyp removal    . DILATION AND CURETTAGE OF UTERUS    . HYSTEROSCOPY    . HYSTEROSCOPY W/D&C N/A 10/12/2015   Procedure: DILATATION AND CURETTAGE /HYSTEROSCOPY;  Surgeon: Will Bonnet, MD;  Location: [facility] ORS;  Service: Gynecology;  Laterality: N/A;  . LAPAROSCOPIC BILATERAL SALPINGO OOPHERECTOMY N/A 10/12/2015  Procedure: LAPAROSCOPIC RIGHT SALPINGECTOMY, LEFT OOPHORECTOMY;  Surgeon: Will Bonnet, MD;  Location: [facility] ORS;  Service: Gynecology;  Laterality: N/A;  . LAPAROSCOPIC OVARIAN CYSTECTOMY Bilateral 10/12/2015   Procedure: LAPAROSCOPIC OVARIAN CYSTECTOMY;  Surgeon: Will Bonnet, MD;  Location: [facility] ORS;  Service: Gynecology;  Laterality: Bilateral;  . OOPHORECTOMY    . OVARIAN CYST REMOVAL      Family History  Problem Relation Age of Onset  . Hypertension Mother   . Diabetes Mother   . CAD Mother   . Cancer Mother      cervical  . CAD Father   . Hypertension Father   . Arthritis Father     Social History   Social History  . Marital status: Married    Spouse name: N/A  . Number of children: N/A  . Years of education: N/A   Occupational History  . Museum/gallery curator    Social History Main Topics  . Smoking status: Former Smoker    Years: 0.50    Quit date: 12/28/2002  . Smokeless tobacco: Never Used  . Alcohol use 0.0 oz/week     Comment: occasional margarita, once a month  . Drug use: No  . Sexual activity: Yes    Partners: Male     Comment: Partial Hysterectomy   Other Topics Concern  . Not on file   Social History Narrative  . No narrative on file     Current Outpatient Prescriptions:  .  albuterol (PROAIR HFA) 108 (90 Base) MCG/ACT inhaler, Inhale 2 puffs into the lungs every 6 (six) hours as needed for wheezing or shortness of breath., Disp: 1 Inhaler, Rfl: 0 .  Cholecalciferol (VITAMIN D) 2000 UNITS tablet, Take 1 tablet by mouth daily., Disp: , Rfl:  .  fexofenadine (ALLEGRA) 180 MG tablet, Take 180 mg by mouth daily., Disp: , Rfl:  .  fluticasone (FLONASE) 50 MCG/ACT nasal spray, Place 2 sprays into both nostrils daily., Disp: 16 g, Rfl: 5 .  ibuprofen (ADVIL,MOTRIN) 600 MG tablet, Take 1 tablet (600 mg total) by mouth every 6 (six) hours as needed for mild pain or cramping., Disp: 30 tablet, Rfl: 0 .  metFORMIN (GLUCOPHAGE) 500 MG tablet, Take 1 tablet (500 mg total) by mouth every evening., Disp: 30 tablet, Rfl: 2 .  mometasone-formoterol (DULERA) 100-5 MCG/ACT AERO, Inhale 2 puffs into the lungs 2 (two) times daily., Disp: 1 Inhaler, Rfl: 2 .  montelukast (SINGULAIR) 10 MG tablet, Take 1 tablet (10 mg total) by mouth at bedtime., Disp: 90 tablet, Rfl: 0 .  ciprofloxacin (CIPRO) 250 MG tablet, Take 1 tablet (250 mg total) by mouth 2 (two) times daily., Disp: 6 tablet, Rfl: 0 .  valsartan-hydrochlorothiazide (DIOVAN-HCT) 160-12.5 MG tablet, Take 1 tablet by mouth daily., Disp: 30  tablet, Rfl: 2    ROS  Constitutional: Negative for fever or weight change.  Respiratory: Negative for cough , positive for occasional  shortness of breath.   Cardiovascular: Negative for chest pain or palpitations.  Gastrointestinal: Negative for abdominal pain, no bowel changes.  Musculoskeletal: Negative for gait problem or joint swelling.  Skin: Negative for rash.  Neurological: Negative for dizziness or headache.  No other specific complaints in a complete review of systems (except as listed in HPI above).  Objective  Vitals:   11/17/15 1155  BP: (!) 130/92  Pulse: 80  Resp: 16  Temp: 98.7 F (37.1 C)  TempSrc: Oral  SpO2: 95%  Weight: 258 lb (117 kg)  Height: '5\' 2"'$  (  1.575 m)    Body mass index is 47.19 kg/m.  Physical Exam  Constitutional: Patient appears well-developed and well-nourished. Obese  No distress.  HEENT: head atraumatic, normocephalic, pupils equal and reactive to light, neck supple, throat within normal limits Cardiovascular: Normal rate, regular rhythm and normal heart sounds.  No murmur heard. No BLE edema. Pulmonary/Chest: Effort normal and breath sounds normal. No respiratory distress. Abdominal: Soft.  There is no tenderness. Psychiatric: Patient has a normal mood and affect. behavior is normal. Judgment and thought content normal.  Recent Results (from the past 2160 hour(s))  Lipase, blood     Status: None   Collection Time: 09/13/15 12:49 AM  Result Value Ref Range   Lipase 26 11 - 51 U/L  Comprehensive metabolic panel     Status: Abnormal   Collection Time: 09/13/15 12:49 AM  Result Value Ref Range   Sodium 138 135 - 145 mmol/L   Potassium 3.5 3.5 - 5.1 mmol/L   Chloride 106 101 - 111 mmol/L   CO2 25 22 - 32 mmol/L   Glucose, Bld 130 (H) 65 - 99 mg/dL   BUN 10 6 - 20 mg/dL   Creatinine, Ser 0.63 0.44 - 1.00 mg/dL   Calcium 8.6 (L) 8.9 - 10.3 mg/dL   Total Protein 7.2 6.5 - 8.1 g/dL   Albumin 3.8 3.5 - 5.0 g/dL   AST 15 15 - 41  U/L   ALT 12 (L) 14 - 54 U/L   Alkaline Phosphatase 58 38 - 126 U/L   Total Bilirubin 0.6 0.3 - 1.2 mg/dL   GFR calc non Af Amer >60 >60 mL/min   GFR calc Af Amer >60 >60 mL/min    Comment: (NOTE) The eGFR has been calculated using the CKD EPI equation. This calculation has not been validated in all clinical situations. eGFR's persistently <60 mL/min signify possible Chronic Kidney Disease.    Anion gap 7 5 - 15  CBC     Status: Abnormal   Collection Time: 09/13/15 12:49 AM  Result Value Ref Range   WBC 9.0 3.6 - 11.0 K/uL   RBC 4.91 3.80 - 5.20 MIL/uL   Hemoglobin 13.1 12.0 - 16.0 g/dL   HCT 38.2 35.0 - 47.0 %   MCV 77.7 (L) 80.0 - 100.0 fL   MCH 26.6 26.0 - 34.0 pg   MCHC 34.2 32.0 - 36.0 g/dL   RDW 14.7 (H) 11.5 - 14.5 %   Platelets 277 150 - 440 K/uL  Urinalysis complete, with microscopic     Status: Abnormal   Collection Time: 09/13/15 12:56 AM  Result Value Ref Range   Color, Urine YELLOW (A) YELLOW   APPearance HAZY (A) CLEAR   Glucose, UA NEGATIVE NEGATIVE mg/dL   Bilirubin Urine NEGATIVE NEGATIVE   Ketones, ur TRACE (A) NEGATIVE mg/dL   Specific Gravity, Urine 1.036 (H) 1.005 - 1.030   Hgb urine dipstick 3+ (A) NEGATIVE   pH 5.0 5.0 - 8.0   Protein, ur 100 (A) NEGATIVE mg/dL   Nitrite NEGATIVE NEGATIVE   Leukocytes, UA TRACE (A) NEGATIVE   RBC / HPF TOO NUMEROUS TO COUNT 0 - 5 RBC/hpf   WBC, UA TOO NUMEROUS TO COUNT 0 - 5 WBC/hpf   Bacteria, UA RARE (A) NONE SEEN   Squamous Epithelial / LPF 0-5 (A) NONE SEEN   Mucous PRESENT   Urine culture     Status: Abnormal   Collection Time: 09/13/15 12:56 AM  Result Value Ref Range  Specimen Description URINE, RANDOM    Special Requests NONE    Culture (A)     <10,000 COLONIES/mL INSIGNIFICANT GROWTH Performed at [facility]    Report Status 09/14/2015 FINAL   Pregnancy, urine POC     Status: None   Collection Time: 09/13/15  1:02 AM  Result Value Ref Range   Preg Test, Ur NEGATIVE NEGATIVE    Comment:         THE SENSITIVITY OF THIS METHODOLOGY IS >24 mIU/mL   Wet prep, genital     Status: Abnormal   Collection Time: 09/13/15  7:15 AM  Result Value Ref Range   Yeast Wet Prep HPF POC NONE SEEN NONE SEEN   Trich, Wet Prep NONE SEEN NONE SEEN   Clue Cells Wet Prep HPF POC NONE SEEN NONE SEEN   WBC, Wet Prep HPF POC MODERATE (A) NONE SEEN   Sperm NONE SEEN   Chlamydia/NGC rt PCR ([facility] only)     Status: None   Collection Time: 09/13/15  7:15 AM  Result Value Ref Range   Specimen source GC/Chlam SWAB    Chlamydia Tr NOT DETECTED NOT DETECTED   N gonorrhoeae NOT DETECTED NOT DETECTED    Comment: (NOTE) 100  This methodology has not been evaluated in pregnant women or in 200  patients with a history of hysterectomy. 300 400  This methodology will not be performed on patients less than 12  years of age.   Pregnancy, urine     Status: None   Collection Time: 10/05/15  9:30 AM  Result Value Ref Range   Preg Test, Ur NEGATIVE NEGATIVE  Surgical pathology     Status: None   Collection Time: 10/05/15 10:25 AM  Result Value Ref Range   SURGICAL PATHOLOGY      Surgical Pathology CASE: [id number] PATIENT: Marisue Sharp Surgical Pathology Report     SPECIMEN SUBMITTED: A. Cervical polyp  CLINICAL HISTORY: None provided  PRE-OPERATIVE DIAGNOSIS: None provided  POST-OPERATIVE DIAGNOSIS: None provided.     DIAGNOSIS: A. CERVICAL POLYP; REMOVAL: - ENDOCERVICAL POLYP, INFLAMED. - NEGATIVE FOR ATYPIA AND MALIGNANCY.   GROSS DESCRIPTION: A. Labeled: patient's name and medical record number Tissue fragment(s): 1 Size: 0.4 x 0.4 x 0.1 cm Description: pink-tan fragment, marked blue  Entirely submitted in 1 cassette(s).  Final Diagnosis performed by Bryan Lemma, MD.  Electronically signed 10/07/2015 9:73:53GD    The electronic signature indicates that the named Attending Pathologist has evaluated the specimen  Technical component performed at [facility], [address] [place], [place] [address] Lab: [phone] Dir: Darrick Penna. Evette Doffing, MD  Professional component performed at [facility], [facility], [place], [place], [place] [address] Lab: [phone] Dir: Dellia Nims. Rubinas, MD    APTT     Status: None   Collection Time: 10/10/15 11:47 AM  Result Value Ref Range   aPTT 28 24 - 36 seconds  CBC     Status: Abnormal   Collection Time: 10/10/15 11:47 AM  Result Value Ref Range   WBC 9.5 3.6 - 11.0 K/uL   RBC 5.04 3.80 - 5.20 MIL/uL   Hemoglobin 13.5 12.0 - 16.0 g/dL   HCT 39.1 35.0 - 47.0 %   MCV 77.5 (L) 80.0 - 100.0 fL   MCH 26.8 26.0 - 34.0 pg   MCHC 34.6 32.0 - 36.0 g/dL   RDW 15.1 (H) 11.5 - 14.5 %   Platelets 279 150 - 440 K/uL  Comprehensive metabolic panel  Status: Abnormal   Collection Time: 10/10/15 11:47 AM  Result Value Ref Range   Sodium 136 135 - 145 mmol/L   Potassium 4.0 3.5 - 5.1 mmol/L   Chloride 105 101 - 111 mmol/L   CO2 25 22 - 32 mmol/L   Glucose, Bld 101 (H) 65 - 99 mg/dL   BUN 13 6 - 20 mg/dL   Creatinine, Ser 0.67 0.44 - 1.00 mg/dL   Calcium 9.0 8.9 - 10.3 mg/dL   Total Protein 7.8 6.5 - 8.1 g/dL   Albumin 4.0 3.5 - 5.0 g/dL   AST 14 (L) 15 - 41 U/L   ALT 14 14 - 54 U/L   Alkaline Phosphatase 62 38 - 126 U/L   Total Bilirubin 0.8 0.3 - 1.2 mg/dL   GFR calc non Af Amer >60 >60 mL/min   GFR calc Af Amer >60 >60 mL/min    Comment: (NOTE) The eGFR has been calculated using the CKD EPI equation. This calculation has not been validated in all clinical situations. eGFR's persistently <60 mL/min signify possible Chronic Kidney Disease.    Anion gap 6 5 - 15  Protime-INR     Status: None   Collection Time: 10/10/15 11:47 AM  Result Value Ref Range   Prothrombin Time 13.4 11.4 - 15.2 seconds   INR 1.02   Type and screen     Status: None   Collection Time: 10/10/15 12:01 PM  Result Value Ref Range   ABO/RH(D) AB POS    Antibody Screen NEG    Sample Expiration 10/24/2015     Extend sample reason NO TRANSFUSIONS OR PREGNANCY IN THE PAST 3 MONTHS   Glucose, capillary     Status: Abnormal   Collection Time: 10/12/15 10:02 AM  Result Value Ref Range   Glucose-Capillary 105 (H) 65 - 99 mg/dL  Pregnancy, urine POC     Status: None   Collection Time: 10/12/15 10:10 AM  Result Value Ref Range   Preg Test, Ur NEGATIVE NEGATIVE    Comment:        THE SENSITIVITY OF THIS METHODOLOGY IS >24 mIU/mL   Surgical pathology     Status: None   Collection Time: 10/12/15 11:55 AM  Result Value Ref Range   SURGICAL PATHOLOGY      Surgical Pathology CASE: [id number] PATIENT: Fate Rinehimer Surgical Pathology Report     SPECIMEN SUBMITTED: A. Endometrial curettings B. Ovary, left, and fallopian tube, right C. Ovarian cyst, right  CLINICAL HISTORY: None provided  PRE-OPERATIVE DIAGNOSIS: Cyst of left ovary, abnormal uterine bleeding  POST-OPERATIVE DIAGNOSIS: Same as pre-op     DIAGNOSIS: A. ENDOMETRIUM; CURETTAGE: - SECRETORY ENDOMETRIUM. - NEGATIVE FOR ATYPIA AND MALIGNANCY.  B. OVARY, LEFT; OOPHORECTOMY: - ENDOMETRIOSIS AND ENDOMETRIOTIC CYST(S).  FALLOPIAN TUBE, RIGHT; SALPINGECTOMY: - HYDROSALPINX.  C. OVARIAN CYST, RIGHT; CYSTECTOMY: - CONSISTENT WITH BENIGN SEROUS CYSTADENOMA.   GROSS DESCRIPTION: A. Intraoperative Consultation:     Received: fresh     Specimen: endometrial curetting     Pathologic Evaluation: frozen section     Diagnosis: Endometrium, curettage:     - Secretory endometrium.     - Negative for atypia and malignancy     Communicated to: Dr.  Glennon Mac at 12:19 PM on 10/12/2015, Bryan Lemma M.D.     Tissue submitted: A1  A. Labeled: endometrial curetting Tissue fragment(s): multiple Size: aggregate, 2.7 x 2.3 x 0.2 cm Description: pink to tan fragments, received fresh, all tissue frozen, following frozen section formalin fixed  Block  summary 1-entire frozen section remnant   B. Intraoperative Consultation:      Received: fresh     Specimen: left ovary and fallopian tube (Dr. Fransisca Connors communicated to Dr. Dwan Bolt fallopian tube)     Pathologic Evaluation: frozen section     Diagnosis: Left ovary:     - Endometriosis and endometriotic cyst     - No neoplasm seen in 2 frozen sections.     Communicated to: Dr. Fransisca Connors at 12:54 PM on 10/12/2015, Bryan Lemma M.D.     Tissue submitted: B1 and B2  B. Labeled: left ovary and fallopian tube Type of procedure: oophorectomy and salpingectomy Integrity: disrupted Size of specimen:           Ovary: aggregate, 6.5 x 4.3 x 1.5 cm           Fallopian tube: 2 t ubular fragments, 3.2 x 0.5 x 0.6 cm and 4.2 x 1.5 x 1.1 cm  Weight of specimen: 23 grams  Location of mass: ovary  Size of mass: disrupted, largest measurement 3.5 cm   Ovarian surface: lobulated pink-tan with focal ecchymosis  Description of mass: disrupted cyst  Cyst contents: chocolate brown fluid  Fallopian tube lumen: largest fragment fimbriated  Block summary: 1-2-frozen section remnants 3-6-additional representative sections ovary 7-representative cross-sections first described tubular fragment 8-representative cross-sections and longitudinal fimbriated end second described tubular fragment  C. Labeled: right ovarian cyst  Tissue fragment(s): 1 Size: 1 g, 1.4 x 1.1 x 0.5 cm Description: collapsed pink tan cyst, inked blue, trisected to reveal a unilocular cyst with smooth lining  Entirely submitted in 1 cassette(s).   Final Diagnosis performed by Bryan Lemma, MD.  Electronically signed 10/13/2015 7:03:34PM    The electronic signature indica tes that the named Attending Pathologist has evaluated the specimen  Technical component performed at [facility], [address], [place], [place] [address] Lab: [phone] Dir: Darrick Penna. Evette Doffing, MD  Professional component performed at [facility], [facility], [place], [place], [place] [address] Lab:  [phone] Dir: Dellia Nims. Rubinas, MD    POCT HgB A1C     Status: Normal   Collection Time: 11/17/15 12:02 PM  Result Value Ref Range   Hemoglobin A1C 5.9   POCT urinalysis dipstick     Status: Normal   Collection Time: 11/17/15 12:14 PM  Result Value Ref Range   Color, UA amber    Clarity, UA clear    Glucose, UA neg    Bilirubin, UA neg    Ketones, UA neg    Spec Grav, UA 1.010    Blood, UA neg    pH, UA 5.0    Protein, UA neg    Urobilinogen, UA negative    Nitrite, UA neg    Leukocytes, UA Negative Negative  POCT UA - Microalbumin     Status: Abnormal   Collection Time: 11/17/15 12:20 PM  Result Value Ref Range   Microalbumin Ur, POC 50 mg/L   Creatinine, POC  mg/dL   Albumin/Creatinine Ratio, Urine, POC    POCT Glucose (CBG)     Status: Abnormal   Collection Time: 11/17/15 12:20 PM  Result Value Ref Range   POC Glucose 124 (A) 70 - 99 mg/dl    Diabetic Foot Exam: Diabetic Foot Exam - Simple   Simple Foot Form Diabetic Foot exam was performed with the following findings:  Yes 11/17/2015 12:43 PM  Visual Inspection No deformities, no ulcerations, no other skin breakdown bilaterally:  Yes Sensation Testing Intact  to touch and monofilament testing bilaterally:  Yes Pulse Check Posterior Tibialis and Dorsalis pulse intact bilaterally:  Yes Comments      PHQ2/9: Depression screen [facility] 2/9 11/17/2015 03/30/2015 12/28/2014  Decreased Interest 0 0 0  Down, Depressed, Hopeless 0 0 0  PHQ - 2 Score 0 0 0     Fall Risk: Fall Risk  11/17/2015 03/30/2015 12/28/2014  Falls in the past year? No No No      Functional Status Survey: Is the patient deaf or have difficulty hearing?: No Does the patient have difficulty seeing, even when wearing glasses/contacts?: No Does the patient have difficulty concentrating, remembering, or making decisions?: No Does the patient have difficulty walking or climbing stairs?: No Does the patient have difficulty dressing or bathing?:  No Does the patient have difficulty doing errands alone such as visiting a doctor's office or shopping?: No   Assessment & Plan  1. Controlled type 2 diabetes mellitus with microalbuminuria, without long-term current use of insulin (HCC)  - POCT HgB A1C - POCT UA - Microalbumin - POCT Glucose (CBG) - Hemoglobin A1c - valsartan-hydrochlorothiazide (DIOVAN-HCT) 160-12.5 MG tablet; Take 1 tablet by mouth daily.  Dispense: 30 tablet; Refill: 2 - metFORMIN (GLUCOPHAGE) 500 MG tablet; Take 1 tablet (500 mg total) by mouth every evening.  Dispense: 30 tablet; Refill: 2  2. Abdominal cramping  - POCT urinalysis dipstick  3. Urinary frequency  - POCT urinalysis dipstick - Urine Culture - ciprofloxacin (CIPRO) 250 MG tablet; Take 1 tablet (250 mg total) by mouth 2 (two) times daily.  Dispense: 6 tablet; Refill: 0  4. Benign essential HTN  - COMPLETE METABOLIC PANEL WITH GFR - valsartan-hydrochlorothiazide (DIOVAN-HCT) 160-12.5 MG tablet; Take 1 tablet by mouth daily.  Dispense: 30 tablet; Refill: 2  5. Dyslipidemia  - Lipid panel  6. Asthma, moderate persistent, uncomplicated  - mometasone-formoterol (DULERA) 100-5 MCG/ACT AERO; Inhale 2 puffs into the lungs 2 (two) times daily.  Dispense: 1 Inhaler; Refill: 2 - montelukast (SINGULAIR) 10 MG tablet; Take 1 tablet (10 mg total) by mouth at bedtime.  Dispense: 90 tablet; Refill: 0 - albuterol (PROAIR HFA) 108 (90 Base) MCG/ACT inhaler; Inhale 2 puffs into the lungs every 6 (six) hours as needed for wheezing or shortness of breath.  Dispense: 1 Inhaler; Refill: 0  7. Morbid obesity due to excess calories [facility])  Discussed with the patient the risk posed by an increased BMI. Discussed importance of portion control, calorie counting and at least 150 minutes of physical activity weekly. Avoid sweet beverages and drink more water. Eat at least 6 servings of fruit and vegetables daily   8. History of endometriosis   9. Needs flu shot  -  Flu Vaccine QUAD 36+ mos IM

## 2015-11-25 LAB — SPECIMEN STATUS REPORT

## 2015-12-01 ENCOUNTER — Ambulatory Visit: Payer: 59 | Admitting: Family Medicine

## 2015-12-22 ENCOUNTER — Encounter: Payer: Self-pay | Admitting: *Deleted

## 2015-12-22 ENCOUNTER — Emergency Department
Admission: EM | Admit: 2015-12-22 | Discharge: 2015-12-22 | Disposition: A | Discharge status: Home / Self Care | Payer: Worker's Compensation | Attending: Emergency Medicine | Admitting: Emergency Medicine

## 2015-12-22 ENCOUNTER — Emergency Department: Payer: Worker's Compensation

## 2015-12-22 DIAGNOSIS — Y929 Unspecified place or not applicable: Secondary | ICD-10-CM | POA: Insufficient documentation

## 2015-12-22 DIAGNOSIS — I10 Essential (primary) hypertension: Secondary | ICD-10-CM | POA: Insufficient documentation

## 2015-12-22 DIAGNOSIS — Z7984 Long term (current) use of oral hypoglycemic drugs: Secondary | ICD-10-CM | POA: Diagnosis not present

## 2015-12-22 DIAGNOSIS — Z792 Long term (current) use of antibiotics: Secondary | ICD-10-CM | POA: Diagnosis not present

## 2015-12-22 DIAGNOSIS — J45909 Unspecified asthma, uncomplicated: Secondary | ICD-10-CM | POA: Insufficient documentation

## 2015-12-22 DIAGNOSIS — Z79899 Other long term (current) drug therapy: Secondary | ICD-10-CM | POA: Diagnosis not present

## 2015-12-22 DIAGNOSIS — S060X0A Concussion without loss of consciousness, initial encounter: Secondary | ICD-10-CM | POA: Insufficient documentation

## 2015-12-22 DIAGNOSIS — W228XXA Striking against or struck by other objects, initial encounter: Secondary | ICD-10-CM | POA: Diagnosis not present

## 2015-12-22 DIAGNOSIS — Z791 Long term (current) use of non-steroidal anti-inflammatories (NSAID): Secondary | ICD-10-CM | POA: Diagnosis not present

## 2015-12-22 DIAGNOSIS — S0990XA Unspecified injury of head, initial encounter: Secondary | ICD-10-CM | POA: Diagnosis present

## 2015-12-22 DIAGNOSIS — Z87891 Personal history of nicotine dependence: Secondary | ICD-10-CM | POA: Diagnosis not present

## 2015-12-22 DIAGNOSIS — Y990 Civilian activity done for income or pay: Secondary | ICD-10-CM | POA: Diagnosis not present

## 2015-12-22 DIAGNOSIS — Y9389 Activity, other specified: Secondary | ICD-10-CM | POA: Diagnosis not present

## 2015-12-22 DIAGNOSIS — E119 Type 2 diabetes mellitus without complications: Secondary | ICD-10-CM | POA: Insufficient documentation

## 2015-12-22 MED ORDER — ONDANSETRON HCL 4 MG PO TABS: 4.0000 mg | ORAL_TABLET | Freq: Three times a day (TID) | ORAL | 0 refills | Status: DC | PRN

## 2015-12-22 MED ORDER — ACETAMINOPHEN 325 MG PO TABS
650.0000 mg | ORAL_TABLET | Freq: Once | ORAL | Status: AC
  Administered 2015-12-22: 650 mg via ORAL
  Filled 2015-12-22: qty 2

## 2015-12-22 MED ORDER — ONDANSETRON 4 MG PO TBDP
4.0000 mg | ORAL_TABLET | Freq: Once | ORAL | Status: AC
  Administered 2015-12-22: 4 mg via ORAL
  Filled 2015-12-22: qty 1

## 2015-12-22 NOTE — Discharge Instructions (Signed)
Follow up with your primary care provider as soon as possible for a recheck. Return to the ER for any symptom of concern. You may take tylenol for headache. Use the ondansetron for nausea as needed.

## 2015-12-22 NOTE — ED Notes (Addendum)
Pt is employed by [employer], [place] [phone], called and was transferred to a number that rang for 3 mins and no answer or voice mail; pt profile states to only perform urine drug screen per request and then document who requested urine drug screen.  Will attempt a second time

## 2015-12-22 NOTE — ED Provider Notes (Signed)
[name] Emergency Department Provider Note ____________________________________________  Time seen: Approximately 6:13 PM  I have reviewed the triage vital signs and the nursing notes.   HISTORY  Chief Complaint No chief complaint on file.   HPI Kelsey Alvarez is a 45 y.o. female who presents to the emergency department for evaluation after being struck in the head by a box while at work last night. She states that she was reaching overhead to get a crossbow off of the top shelfand it came down and struck her in the top of the head. She estimates that it weighed approximately 25 pounds. She denies loss of consciousness. She states that this morning she awakened with a throbbing headache and nausea. She also states that her head hurts worse when she looks straight up or to the left.   Past Medical History:  Diagnosis Date  . Abnormal CBC   . Allergy   . Arthritis    left knee  . Asthma   . Cervical polyp   . Chronic foot pain   . Complication of anesthesia    takes longer to wake up.  . Contact dermatitis   . Diabetes mellitus without complication ([place])   . Dysmenorrhea   . Edema   . 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 GERD (gastroesophageal reflux disease)   . Headache    history of migraines  . Hyperlipidemia   . Hypertension   . Lumbago   . Obesity   . Occasional tremors     Patient Active Problem List   Diagnosis Date Noted  . History of endometriosis 11/17/2015  . Menorrhagia with regular cycle 10/12/2015  . Benign essential HTN 12/28/2014  . Cervical polyp 12/28/2014  . 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 Dyslipidemia 12/28/2014  . Dysmenorrhea 12/28/2014  . Edema 12/28/2014  . Female infertility 12/28/2014  . Gastro-esophageal reflux disease without esophagitis 12/28/2014  . Morbid obesity ([place]) 12/28/2014  . Asthma, moderate persistent, poorly-controlled 12/28/2014  . Perennial allergic  rhinitis 12/28/2014  . Intermittent tremor 12/28/2014  . Vitamin D deficiency 12/28/2014  . LBP (low back pain) 12/28/2014  . Left sciatic nerve pain 12/28/2014    Past Surgical History:  Procedure Laterality Date  . APPENDECTOMY    . cervical polyp removal    . DILATION AND CURETTAGE OF UTERUS    . HYSTEROSCOPY    . HYSTEROSCOPY W/D&C N/A 10/12/2015   Procedure: DILATATION AND CURETTAGE /HYSTEROSCOPY;  Surgeon: Will Bonnet, MD;  Location: [facility] ORS;  Service: Gynecology;  Laterality: N/A;  . LAPAROSCOPIC BILATERAL SALPINGO OOPHERECTOMY N/A 10/12/2015   Procedure: LAPAROSCOPIC RIGHT SALPINGECTOMY, LEFT OOPHORECTOMY;  Surgeon: Will Bonnet, MD;  Location: [facility] ORS;  Service: Gynecology;  Laterality: N/A;  . LAPAROSCOPIC OVARIAN CYSTECTOMY Bilateral 10/12/2015   Procedure: LAPAROSCOPIC OVARIAN CYSTECTOMY;  Surgeon: Will Bonnet, MD;  Location: [facility] ORS;  Service: Gynecology;  Laterality: Bilateral;  . OOPHORECTOMY    . OVARIAN CYST REMOVAL      Prior to Admission medications   Medication Sig Start Date End Date Taking? Authorizing Provider  albuterol (PROAIR HFA) 108 (90 Base) MCG/ACT inhaler Inhale 2 puffs into the lungs every 6 (six) hours as needed for wheezing or shortness of breath. 11/17/15   Steele Sizer, MD  Cholecalciferol (VITAMIN D) 2000 UNITS tablet Take 1 tablet by mouth daily. 08/23/10   Historical Provider, MD  ciprofloxacin (CIPRO) 250 MG tablet Take 1 tablet (250 mg total) by mouth 2 (two) times  daily. 11/17/15   Steele Sizer, MD  fexofenadine (ALLEGRA) 180 MG tablet Take 180 mg by mouth daily.    Historical Provider, MD  fluticasone (FLONASE) 50 MCG/ACT nasal spray Place 2 sprays into both nostrils daily. 12/28/14   Steele Sizer, MD  ibuprofen (ADVIL,MOTRIN) 600 MG tablet Take 1 tablet (600 mg total) by mouth every 6 (six) hours as needed for mild pain or cramping. 10/12/15   Will Bonnet, MD  metFORMIN (GLUCOPHAGE) 500 MG tablet Take 1 tablet (500 mg  total) by mouth every evening. 11/17/15   Steele Sizer, MD  mometasone-formoterol (DULERA) 100-5 MCG/ACT AERO Inhale 2 puffs into the lungs 2 (two) times daily. 11/17/15   Steele Sizer, MD  montelukast (SINGULAIR) 10 MG tablet Take 1 tablet (10 mg total) by mouth at bedtime. 11/17/15   Steele Sizer, MD  ondansetron (ZOFRAN) 4 MG tablet Take 1 tablet (4 mg total) by mouth every 8 (eight) hours as needed for nausea or vomiting. 12/22/15   Victorino Dike, FNP  valsartan-hydrochlorothiazide (DIOVAN-HCT) 160-12.5 MG tablet Take 1 tablet by mouth daily. 11/17/15   Steele Sizer, MD    Allergies Fish allergy; Penicillins; and Adhesive [tape]  Family History  Problem Relation Age of Onset  . Hypertension Mother   . Diabetes Mother   . CAD Mother   . Cancer Mother     cervical  . CAD Father   . Hypertension Father   . Arthritis Father     Social History Social History  Substance Use Topics  . Smoking status: Former Smoker    Years: 0.50    Quit date: 12/28/2002  . Smokeless tobacco: Never Used  . Alcohol use 0.0 oz/week     Comment: occasional margarita, once a month    Review of Systems Constitutional: No fever/chills or recent injury. Eyes: No visual changes. ENT: No sore throat. Respiratory: Denies shortness of breath. Gastrointestinal: No abdominal pain.  No nausea, no vomiting.  No diarrhea.  No constipation. Musculoskeletal: Negative for pain. Skin: Negative for rash. Neurological:Positive for headache, Negative for focal weakness or numbness. No confusion or fainting. ___________________________________________   PHYSICAL EXAM:  VITAL SIGNS: ED Triage Vitals [12/22/15 1755]  Enc Vitals Group     BP (!) 153/91     Pulse Rate 69     Resp 18     Temp 98.6 F (37 C)     Temp Source Oral     SpO2 95 %     Weight 255 lb (115.7 kg)     Height 5\' 1"  (1.549 m)     Head Circumference      Peak Flow      Pain Score 4     Pain Loc      Pain Edu?      Excl. in [place]?      Constitutional: Alert and oriented. Well appearing and in no acute distress. Eyes: Conjunctivae are normal. PERRL. EOMI. No Hyphema Head: Atraumatic. Nose: No congestion/rhinnorhea. Mouth/Throat: Mucous membranes are moist.  Oropharynx non-erythematous. Neck: No stridor. No meningismus.   Cardiovascular: Normal rate, regular rhythm. Grossly normal heart sounds.  Good peripheral circulation. Respiratory: Normal respiratory effort.  No retractions. Musculoskeletal: No lower extremity tenderness nor edema.  No joint effusions. Neurologic:  Normal speech and language. No gross focal neurologic deficits are appreciated. No gait instability. Cranial nerves: 2-10 normal as tested. Cerebellar:Normal Romberg, finger-nose-finger, normal gait. Sensorimotor: No aphasia, pronator drift, clonus, sensory loss or abnormal reflexes.  Skin:  Skin is  warm, dry and intact. No rash noted. Psychiatric: Mood and affect are normal. Speech and behavior are normal. Normal thought process and cognition.  ____________________________________________   LABS (all labs ordered are listed, but only abnormal results are displayed)  Labs Reviewed - No data to display ____________________________________________  EKG   ____________________________________________  RADIOLOGY  CT head without contrast negative for acute abnormality per radiology. ____________________________________________   PROCEDURES  Procedure(s) performed: None  Critical Care performed: No  ____________________________________________   INITIAL IMPRESSION / ASSESSMENT AND PLAN / ED COURSE  Clinical Course    Pertinent labs & imaging results that were available during my care of the patient were reviewed by me and considered in my medical decision making (see chart for details). After Tylenol, patient had partial relief of headache. Prescriptions for Zofran will be prescribed today. Patient was advised to follow up with the  primary care provider for recheck and follow-up. She will take tylenol for headache. Also advised to return to the emergency department for symptoms that change or worsen. ____________________________________________   FINAL CLINICAL IMPRESSION(S) / ED DIAGNOSES  Final diagnoses:  Concussion without loss of consciousness, initial encounter      Victorino Dike, FNP 12/22/15 2033    Eula Listen, MD 12/23/15 0001

## 2015-12-22 NOTE — ED Notes (Signed)
Called pt employer for second time and was placed on hold and transferred to a manager Kelsey Alvarez who stated she dropped pt off and said was not asked for pt to be drug tested so she stated " no urine drug screen requested"

## 2016-02-01 ENCOUNTER — Encounter: Payer: 59 | Admitting: Family Medicine

## 2016-02-05 ENCOUNTER — Other Ambulatory Visit: Payer: Self-pay | Admitting: Family Medicine

## 2016-02-05 DIAGNOSIS — J4540 Moderate persistent asthma, uncomplicated: Secondary | ICD-10-CM

## 2016-03-12 ENCOUNTER — Other Ambulatory Visit: Payer: Self-pay | Admitting: Family Medicine

## 2016-03-12 DIAGNOSIS — I10 Essential (primary) hypertension: Secondary | ICD-10-CM

## 2016-03-12 DIAGNOSIS — E1129 Type 2 diabetes mellitus with other diabetic kidney complication: Secondary | ICD-10-CM

## 2016-03-12 DIAGNOSIS — R809 Proteinuria, unspecified: Secondary | ICD-10-CM

## 2016-03-12 DIAGNOSIS — J4540 Moderate persistent asthma, uncomplicated: Secondary | ICD-10-CM

## 2016-03-12 NOTE — Telephone Encounter (Signed)
Patient requesting refill of Metformin, Singular and Valsartan-HCTZ to [facility].

## 2016-03-16 ENCOUNTER — Other Ambulatory Visit: Payer: Self-pay | Admitting: Family Medicine

## 2016-03-16 DIAGNOSIS — J4540 Moderate persistent asthma, uncomplicated: Secondary | ICD-10-CM

## 2016-03-27 ENCOUNTER — Ambulatory Visit (INDEPENDENT_AMBULATORY_CARE_PROVIDER_SITE_OTHER): Payer: BLUE CROSS/BLUE SHIELD | Admitting: Family Medicine

## 2016-03-27 ENCOUNTER — Encounter: Payer: Self-pay | Admitting: Family Medicine

## 2016-03-27 VITALS — BP 146/86 | HR 71 | Temp 98.1°F | Resp 18 | Wt 263.0 lb

## 2016-03-27 DIAGNOSIS — J4540 Moderate persistent asthma, uncomplicated: Secondary | ICD-10-CM | POA: Diagnosis not present

## 2016-03-27 DIAGNOSIS — I10 Essential (primary) hypertension: Secondary | ICD-10-CM | POA: Diagnosis not present

## 2016-03-27 DIAGNOSIS — M5441 Lumbago with sciatica, right side: Secondary | ICD-10-CM | POA: Diagnosis not present

## 2016-03-27 DIAGNOSIS — E1129 Type 2 diabetes mellitus with other diabetic kidney complication: Secondary | ICD-10-CM

## 2016-03-27 DIAGNOSIS — R809 Proteinuria, unspecified: Secondary | ICD-10-CM | POA: Diagnosis not present

## 2016-03-27 DIAGNOSIS — R5383 Other fatigue: Secondary | ICD-10-CM

## 2016-03-27 DIAGNOSIS — E785 Hyperlipidemia, unspecified: Secondary | ICD-10-CM

## 2016-03-27 DIAGNOSIS — R202 Paresthesia of skin: Secondary | ICD-10-CM

## 2016-03-27 LAB — CBC WITH DIFFERENTIAL/PLATELET
BASOS ABS: 92 {cells}/uL (ref 0–200)
Basophils Relative: 1 %
Eosinophils Absolute: 184 cells/uL (ref 15–500)
Eosinophils Relative: 2 %
HEMATOCRIT: 40.3 % (ref 35.0–45.0)
Hemoglobin: 13.6 g/dL (ref 11.7–15.5)
LYMPHS PCT: 30 %
Lymphs Abs: 2760 cells/uL (ref 850–3900)
MCH: 26.1 pg — AB (ref 27.0–33.0)
MCHC: 33.7 g/dL (ref 32.0–36.0)
MCV: 77.2 fL — ABNORMAL LOW (ref 80.0–100.0)
MONO ABS: 644 {cells}/uL (ref 200–950)
MPV: 9 fL (ref 7.5–12.5)
Monocytes Relative: 7 %
NEUTROS PCT: 60 %
Neutro Abs: 5520 cells/uL (ref 1500–7800)
Platelets: 310 10*3/uL (ref 140–400)
RBC: 5.22 MIL/uL — AB (ref 3.80–5.10)
RDW: 14.9 % (ref 11.0–15.0)
WBC: 9.2 10*3/uL (ref 3.8–10.8)

## 2016-03-27 LAB — COMPLETE METABOLIC PANEL WITH GFR
ALBUMIN: 4.2 g/dL (ref 3.6–5.1)
ALK PHOS: 58 U/L (ref 33–115)
ALT: 15 U/L (ref 6–29)
AST: 14 U/L (ref 10–30)
BUN: 12 mg/dL (ref 7–25)
CALCIUM: 9.5 mg/dL (ref 8.6–10.2)
CO2: 25 mmol/L (ref 20–31)
CREATININE: 0.69 mg/dL (ref 0.50–1.10)
Chloride: 102 mmol/L (ref 98–110)
GFR, Est African American: >89 mL/min (ref >=60)
GFR, Est Non African American: >89 mL/min (ref >=60)
GLUCOSE: 104 mg/dL — AB (ref 65–99)
Potassium: 4.7 mmol/L (ref 3.5–5.3)
Sodium: 138 mmol/L (ref 135–146)
Total Bilirubin: 0.3 mg/dL (ref 0.2–1.2)
Total Protein: 7.3 g/dL (ref 6.1–8.1)

## 2016-03-27 LAB — TSH: TSH: 1.44 m[IU]/L

## 2016-03-27 LAB — LIPID PANEL
CHOLESTEROL: 219 mg/dL — AB (ref <200)
HDL: 43 mg/dL — ABNORMAL LOW (ref >50)
LDL Cholesterol: 143 mg/dL — ABNORMAL HIGH (ref <100)
Total CHOL/HDL Ratio: 5.1 Ratio — ABNORMAL HIGH (ref <5.0)
Triglycerides: 166 mg/dL — ABNORMAL HIGH (ref <150)
VLDL: 33 mg/dL — ABNORMAL HIGH (ref <30)

## 2016-03-27 LAB — VITAMIN B12: VITAMIN B 12: 366 pg/mL (ref 200–1100)

## 2016-03-27 MED ORDER — NAPROXEN 500 MG PO TABS: 500.0000 mg | ORAL_TABLET | Freq: Two times a day (BID) | ORAL | 0 refills | Status: DC

## 2016-03-27 MED ORDER — MOMETASONE FURO-FORMOTEROL FUM 100-5 MCG/ACT IN AERO: 2.0000 | INHALATION_SPRAY | Freq: Two times a day (BID) | RESPIRATORY_TRACT | 2 refills | Status: DC

## 2016-03-27 NOTE — Progress Notes (Signed)
Name: Kelsey Alvarez   MRN: [id number]    DOB: [birth year]   Date:03/27/2016       Progress Note  Subjective  Chief Complaint  Chief Complaint  Patient presents with  . Numbness    Onset-1 month, bilateral hands become numb and tingling, happening more frequently and would like to be checked. Patient states her arms become weak and hard to move.   . Leg Pain    Onset-Intermittently for 1 month, Lower back to left leg pain-can not hardly move. Patient joins are very sore and hurt.     HPI  Hyperlipidemia: off medication, she is due for repeat labs, she states statin therapy caused headache in the past  Morbid Obesity: she has gained 5 lbs since last visit,  she is still drinking sodas but down to 16 ounces daily, she has been adding more water, and also drinks lemonade. Discussed importance of eliminating any sweet beverages.   Endometriosis: seen by Dr. Glennon Mac and had a cyst left ovary, s/p left oophorectomy and bilateral salpingectomy - pathology showed endometriosis. She did well post-op ( surgery 10/2015 ). She also had a D&C for dysfuncional bleeding, she states she still has cycles, regular, but not heavy since procedure and pain has resolved.   DM: glucose at home has been around 120's, occasionally goes up 130's.  She has noticed polyphagia, polydipsia and polyuria. She is  due for labs and eye exam. She has been taking Metformin as prescribed. She has proteinuria and is on ARB. She has also noticed tingling on both hands, and both feet intermittently. It can happen during rest or activity.   Asthma Moderate persistent: symptoms are better controlled, she is still using Dulera once daliy, and has occasional cough, only using rescue inhaler about once a week  [id number] bp medication daily, no chest pain or palpitation  Back pain with radiculitis: she has noticed pain on left lower back over the past month, radiates down to left buttocks and across her anterior left thigh down  to her foot. Described as throbbing, intermittent, can last hours, it causes her to limp, worse with activity and better with rest.   Fatigue: she has been feeling very tired lately, she snores, discussed sleep study, but she wants to check with her insurance first.   Patient Active Problem List   Diagnosis Date Noted  . History of endometriosis 11/17/2015  . Menorrhagia with regular cycle 10/12/2015  . Benign essential HTN 12/28/2014  . Cervical polyp 12/28/2014  . 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 Dyslipidemia 12/28/2014  . Dysmenorrhea 12/28/2014  . Edema 12/28/2014  . Female infertility 12/28/2014  . Gastro-esophageal reflux disease without esophagitis 12/28/2014  . Morbid obesity ([place]) 12/28/2014  . Asthma, moderate persistent, poorly-controlled 12/28/2014  . Perennial allergic rhinitis 12/28/2014  . Intermittent tremor 12/28/2014  . Vitamin D deficiency 12/28/2014  . LBP (low back pain) 12/28/2014  . Left sciatic nerve pain 12/28/2014    Past Surgical History:  Procedure Laterality Date  . APPENDECTOMY    . cervical polyp removal    . DILATION AND CURETTAGE OF UTERUS    . HYSTEROSCOPY    . HYSTEROSCOPY W/D&C N/A 10/12/2015   Procedure: DILATATION AND CURETTAGE /HYSTEROSCOPY;  Surgeon: Will Bonnet, MD;  Location: [facility] ORS;  Service: Gynecology;  Laterality: N/A;  . LAPAROSCOPIC BILATERAL SALPINGO OOPHERECTOMY N/A 10/12/2015   Procedure: LAPAROSCOPIC RIGHT SALPINGECTOMY, LEFT OOPHORECTOMY;  Surgeon: Will Bonnet, MD;  Location: [facility] ORS;  Service: Gynecology;  Laterality: N/A;  . LAPAROSCOPIC OVARIAN CYSTECTOMY Bilateral 10/12/2015   Procedure: LAPAROSCOPIC OVARIAN CYSTECTOMY;  Surgeon: Will Bonnet, MD;  Location: [facility] ORS;  Service: Gynecology;  Laterality: Bilateral;  . OOPHORECTOMY    . OVARIAN CYST REMOVAL      Family History  Problem Relation Age of Onset  . Hypertension Mother   . Diabetes Mother   . CAD  Mother   . Cancer Mother     cervical  . CAD Father   . Hypertension Father   . Arthritis Father     Social History   Social History  . Marital status: Married    Spouse name: N/A  . Number of children: N/A  . Years of education: N/A   Occupational History  . Museum/gallery curator    Social History Main Topics  . Smoking status: Former Smoker    Years: 0.50    Quit date: 12/28/2002  . Smokeless tobacco: Never Used  . Alcohol use 0.0 oz/week     Comment: occasional margarita, once a month  . Drug use: No  . Sexual activity: Yes    Partners: Male     Comment: Partial Hysterectomy   Other Topics Concern  . Not on file   Social History Narrative  . No narrative on file     Current Outpatient Prescriptions:  .  Cholecalciferol (VITAMIN D) 2000 UNITS tablet, Take 1 tablet by mouth daily., Disp: , Rfl:  .  fexofenadine (ALLEGRA) 180 MG tablet, Take 180 mg by mouth daily., Disp: , Rfl:  .  fluticasone (FLONASE) 50 MCG/ACT nasal spray, Place 2 sprays into both nostrils daily., Disp: 16 g, Rfl: 5 .  metFORMIN (GLUCOPHAGE) 500 MG tablet, TAKE ONE TABLET BY MOUTH IN THE EVENING, Disp: 90 tablet, Rfl: 0 .  mometasone-formoterol (DULERA) 100-5 MCG/ACT AERO, Inhale 2 puffs into the lungs 2 (two) times daily., Disp: 1 Inhaler, Rfl: 2 .  montelukast (SINGULAIR) 10 MG tablet, TAKE ONE TABLET BY MOUTH ONCE DAILY AT BEDTIME, Disp: 90 tablet, Rfl: 0 .  PROAIR HFA 108 (90 Base) MCG/ACT inhaler, INHALE TWO PUFFS BY MOUTH EVERY 6 HOURS AS NEEDED FOR WHEEZING OR SHORTNESS OF BREATH., Disp: 9 each, Rfl: 0 .  valsartan-hydrochlorothiazide (DIOVAN-HCT) 160-12.5 MG tablet, TAKE ONE TABLET BY MOUTH ONCE DAILY, Disp: 90 tablet, Rfl: 0 .  naproxen (NAPROSYN) 500 MG tablet, Take 1 tablet (500 mg total) by mouth 2 (two) times daily with a meal., Disp: 30 tablet, Rfl: 0  Allergies  Allergen Reactions  . Fish Allergy Shortness Of Breath  . Penicillins Anaphylaxis and Rash    Childhood allergy  . Adhesive  [Tape] Rash    Paper tape ok to use.     ROS  Constitutional: Negative for fever , positive for  weight change.  Respiratory: Positive  for intermittent cough and shortness of breath.   Cardiovascular: Negative for chest pain or palpitations.  Gastrointestinal: Negative for abdominal pain, no bowel changes.  Musculoskeletal: Positive  for gait problem and right knee  joint swelling.  Skin: Negative for rash.  Neurological: Negative for dizziness or headache.  No other specific complaints in a complete review of systems (except as listed in HPI above).  Objective  Vitals:   03/27/16 0853  BP: (!) 146/86  Pulse: 71  Resp: 18  Temp: 98.1 F (36.7 C)  TempSrc: Oral  SpO2: 94%  Weight: 263 lb (119.3 kg)    Body mass index is 49.69 kg/m.  Physical Exam  Constitutional: Patient appears well-developed and well-nourished. Obese  No distress.  HEENT: head atraumatic, normocephalic, pupils equal and reactive to light,neck supple, throat within normal limits Cardiovascular: Normal rate, regular rhythm and normal heart sounds.  No murmur heard. No BLE edema. Pulmonary/Chest: Effort normal and breath sounds normal. No respiratory distress. Abdominal: Soft.  There is no tenderness. Psychiatric: Patient has a normal mood and affect. behavior is normal. Judgment and thought content normal. Muscular Skeletal: pain during palpation of left lower back, sciatic notch and pain with left straight leg raise, going down the front of her leg   PHQ2/9: Depression screen [facility] 2/9 03/27/2016 11/17/2015 03/30/2015 12/28/2014  Decreased Interest 0 0 0 0  Down, Depressed, Hopeless 0 0 0 0  PHQ - 2 Score 0 0 0 0     Fall Risk: Fall Risk  03/27/2016 11/17/2015 03/30/2015 12/28/2014  Falls in the past year? No No No No     Functional Status Survey: Is the patient deaf or have difficulty hearing?: No Does the patient have difficulty seeing, even when wearing glasses/contacts?: No Does the patient  have difficulty concentrating, remembering, or making decisions?: No Does the patient have difficulty walking or climbing stairs?: No Does the patient have difficulty dressing or bathing?: No Does the patient have difficulty doing errands alone such as visiting a doctor's office or shopping?: No    Assessment & Plan  1. Controlled type 2 diabetes mellitus with microalbuminuria, without long-term current use of insulin (HCC)  No labs in a long time, we ordered hgbA1C back in 09 but it was never done, discussed importance compliance - Hemoglobin A1c  2. Morbid obesity due to excess calories [facility])  Discussed with the patient the risk posed by an increased BMI. Discussed importance of portion control, calorie counting and at least 150 minutes of physical activity weekly. Avoid sweet beverages and drink more water. Eat at least 6 servings of fruit and vegetables daily   3. Benign essential HTN  - COMPLETE METABOLIC PANEL WITH GFR BP is elevated today, but she is in pain, we will re-assess on her next visit in a couple of weeks  4. Dyslipidemia  - Lipid panel  5. Paresthesia  - Vitamin B12  6. Other fatigue  - Vitamin B12 - VITAMIN D 25 Hydroxy (Vit-D Deficiency, Fractures) - TSH - CBC with Differential/Platelet  7. Acute right-sided low back pain with right-sided sciatica  We don't know how her DM is , so we will try naproxen for now, wait for B12 results and consider X-ray lumbar spine, MRI and gabapentin depending on b12 - naproxen (NAPROSYN) 500 MG tablet; Take 1 tablet (500 mg total) by mouth 2 (two) times daily with a meal.  Dispense: 30 tablet; Refill: 0  8. Asthma, moderate persistent, well-controlled  - mometasone-formoterol (DULERA) 100-5 MCG/ACT AERO; Inhale 2 puffs into the lungs 2 (two) times daily.  Dispense: 1 Inhaler; Refill: 2

## 2016-03-28 LAB — HEMOGLOBIN A1C
Hgb A1c MFr Bld: 6 % — ABNORMAL HIGH (ref <5.7)
Mean Plasma Glucose: 126 mg/dL

## 2016-03-28 LAB — VITAMIN D 25 HYDROXY (VIT D DEFICIENCY, FRACTURES): VIT D 25 HYDROXY: 27 ng/mL — AB (ref 30–100)

## 2016-04-12 ENCOUNTER — Ambulatory Visit: Payer: BLUE CROSS/BLUE SHIELD | Admitting: Family Medicine

## 2016-04-27 ENCOUNTER — Encounter: Payer: Self-pay | Admitting: Family Medicine

## 2016-04-27 ENCOUNTER — Ambulatory Visit (INDEPENDENT_AMBULATORY_CARE_PROVIDER_SITE_OTHER): Payer: BLUE CROSS/BLUE SHIELD | Admitting: Family Medicine

## 2016-04-27 ENCOUNTER — Other Ambulatory Visit: Payer: Self-pay | Admitting: Family Medicine

## 2016-04-27 VITALS — HR 77 | Temp 98.2°F | Resp 16 | Ht 61.75 in | Wt 266.8 lb

## 2016-04-27 DIAGNOSIS — Z1231 Encounter for screening mammogram for malignant neoplasm of breast: Secondary | ICD-10-CM | POA: Diagnosis not present

## 2016-04-27 DIAGNOSIS — E1169 Type 2 diabetes mellitus with other specified complication: Secondary | ICD-10-CM

## 2016-04-27 DIAGNOSIS — E538 Deficiency of other specified B group vitamins: Secondary | ICD-10-CM | POA: Diagnosis not present

## 2016-04-27 DIAGNOSIS — Z01419 Encounter for gynecological examination (general) (routine) without abnormal findings: Secondary | ICD-10-CM | POA: Diagnosis not present

## 2016-04-27 DIAGNOSIS — Z124 Encounter for screening for malignant neoplasm of cervix: Secondary | ICD-10-CM | POA: Diagnosis not present

## 2016-04-27 DIAGNOSIS — Z1239 Encounter for other screening for malignant neoplasm of breast: Secondary | ICD-10-CM

## 2016-04-27 DIAGNOSIS — E785 Hyperlipidemia, unspecified: Secondary | ICD-10-CM

## 2016-04-27 MED ORDER — CYANOCOBALAMIN 1000 MCG/ML IJ SOLN
1000.0000 ug | Freq: Once | INTRAMUSCULAR | Status: AC
  Administered 2016-04-27: 1000 ug via INTRAMUSCULAR

## 2016-04-27 MED ORDER — CYANOCOBALAMIN 1000 MCG/ML IJ SOLN: 1000.0000 ug | Freq: Once | INTRAMUSCULAR | 0 refills | Status: AC

## 2016-04-27 MED ORDER — ATORVASTATIN CALCIUM 40 MG PO TABS: 40.0000 mg | ORAL_TABLET | Freq: Every day | ORAL | 1 refills | Status: DC

## 2016-04-27 MED ORDER — BUDESONIDE-FORMOTEROL FUMARATE 160-4.5 MCG/ACT IN AERO: 2.0000 | INHALATION_SPRAY | Freq: Two times a day (BID) | RESPIRATORY_TRACT | 2 refills | Status: DC

## 2016-04-27 NOTE — Progress Notes (Addendum)
Name: Kelsey Alvarez   MRN: [id number]    DOB: 02/28/1971   Date:04/27/2016       Progress Note  Subjective  Chief Complaint  Chief Complaint  Patient presents with  . Annual Exam    HPI  Well Woman: no breast lumps, no nipple discharge, but she has intermittent nipple discomfort. She has a decrease in libido but not significant. Mild vaginal discharge only prior to her cycles. She has a history endometriosis and recent surgery, cycles still regular but not as painful as it was prior to surgery  B12 def: she would like monthly B12 shots  Asthma: Ruthe Mannan is too expensive we will try changing to Symbicort  Dyslipidemia: she is willing to try statin again  Patient Active Problem List   Diagnosis Date Noted  . B12 deficiency 04/27/2016  . History of endometriosis 11/17/2015  . Menorrhagia with regular cycle 10/12/2015  . Benign essential HTN 12/28/2014  . Cervical polyp 12/28/2014  . Controlled type 2 diabetes mellitus with microalbuminuria ([place]) 12/28/2014  . Dyslipidemia 12/28/2014  . Dysmenorrhea 12/28/2014  . Edema 12/28/2014  . Female infertility 12/28/2014  . Gastro-esophageal reflux disease without esophagitis 12/28/2014  . Morbid obesity ([place]) 12/28/2014  . Asthma, moderate persistent, poorly-controlled 12/28/2014  . Perennial allergic rhinitis 12/28/2014  . Intermittent tremor 12/28/2014  . Vitamin D deficiency 12/28/2014  . LBP (low back pain) 12/28/2014  . Left sciatic nerve pain 12/28/2014    Past Surgical History:  Procedure Laterality Date  . APPENDECTOMY    . cervical polyp removal    . DILATION AND CURETTAGE OF UTERUS    . HYSTEROSCOPY    . HYSTEROSCOPY W/D&C N/A 10/12/2015   Procedure: DILATATION AND CURETTAGE /HYSTEROSCOPY;  Surgeon: Will Bonnet, MD;  Location: [facility];  Service: Gynecology;  Laterality: N/A;  . LAPAROSCOPIC BILATERAL SALPINGO OOPHERECTOMY N/A 10/12/2015   Procedure: LAPAROSCOPIC RIGHT SALPINGECTOMY, LEFT OOPHORECTOMY;  Surgeon:  Will Bonnet, MD;  Location: [facility];  Service: Gynecology;  Laterality: N/A;  . LAPAROSCOPIC OVARIAN CYSTECTOMY Bilateral 10/12/2015   Procedure: LAPAROSCOPIC OVARIAN CYSTECTOMY;  Surgeon: Will Bonnet, MD;  Location: [facility];  Service: Gynecology;  Laterality: Bilateral;  . OOPHORECTOMY    . OVARIAN CYST REMOVAL      Family History  Problem Relation Age of Onset  . Hypertension Mother   . Diabetes Mother   . CAD Mother   . Cancer Mother     cervical  . Heart attack Mother 106    Triple Bypass  . CAD Father   . Hypertension Father   . Arthritis Father   . Thyroid disease Sister   . Alzheimer's disease Maternal Grandmother   . Alzheimer's disease Maternal Grandfather   . Breast cancer Paternal Grandmother     Social History   Social History  . Marital status: Married    Spouse name: N/A  . Number of children: N/A  . Years of education: N/A   Occupational History  . Museum/gallery curator    Social History Main Topics  . Smoking status: Former Smoker    Years: 1.00    Types: Cigarettes    Start date: 12/03/2001    Quit date: 12/28/2002  . Smokeless tobacco: Never Used     Comment: smoked 3 to 4 cigarrettes a day for a year  . Alcohol use 0.0 oz/week     Comment: occasionally drinks margarita, once or twice a year  . Drug use: No  . Sexual activity: Yes  Partners: Male     Comment: Partial Hysterectomy   Other Topics Concern  . Not on file   Social History Narrative  . No narrative on file     Current Outpatient Prescriptions:  .  Cholecalciferol (VITAMIN D) 2000 UNITS tablet, Take 1 tablet by mouth daily., Disp: , Rfl:  .  fexofenadine (ALLEGRA) 180 MG tablet, Take 180 mg by mouth daily., Disp: , Rfl:  .  fluticasone (FLONASE) 50 MCG/ACT nasal spray, Place 2 sprays into both nostrils daily., Disp: 16 g, Rfl: 5 .  metFORMIN (GLUCOPHAGE) 500 MG tablet, TAKE ONE TABLET BY MOUTH IN THE EVENING, Disp: 90 tablet, Rfl: 0 .  mometasone-formoterol (DULERA) 100-5  MCG/ACT AERO, Inhale 2 puffs into the lungs 2 (two) times daily., Disp: 1 Inhaler, Rfl: 2 .  montelukast (SINGULAIR) 10 MG tablet, TAKE ONE TABLET BY MOUTH ONCE DAILY AT BEDTIME, Disp: 90 tablet, Rfl: 0 .  naproxen (NAPROSYN) 500 MG tablet, Take 1 tablet (500 mg total) by mouth 2 (two) times daily with a meal., Disp: 30 tablet, Rfl: 0 .  PROAIR HFA 108 (90 Base) MCG/ACT inhaler, INHALE TWO PUFFS BY MOUTH EVERY 6 HOURS AS NEEDED FOR WHEEZING OR SHORTNESS OF BREATH., Disp: 9 each, Rfl: 0 .  valsartan-hydrochlorothiazide (DIOVAN-HCT) 160-12.5 MG tablet, TAKE ONE TABLET BY MOUTH ONCE DAILY, Disp: 90 tablet, Rfl: 0 .  cyanocobalamin (,VITAMIN B-12,) 1000 MCG/ML injection, Inject 1 mL (1,000 mcg total) into the muscle once., Disp: 1 mL, Rfl: 0  Allergies  Allergen Reactions  . Fish Allergy Shortness Of Breath  . Penicillins Anaphylaxis and Rash    Childhood allergy  . Adhesive [Tape] Rash    Paper tape ok to use.     ROS  Constitutional: Negative for fever or weight change.  Respiratory: Negative for cough and shortness of breath.   Cardiovascular: Negative for chest pain or palpitations.  Gastrointestinal: Negative for abdominal pain, no bowel changes.  Musculoskeletal: Negative for gait problem or joint swelling.  Skin: Negative for rash.  Neurological: Negative for dizziness or headache.  No other specific complaints in a complete review of systems (except as listed in HPI above).  Objective  Vitals:   04/27/16 1116  Pulse: 77  Resp: 16  Temp: 98.2 F (36.8 C)  TempSrc: Oral  SpO2: 97%  Weight: 266 lb 12.8 oz (121 kg)  Height: 5' 1.75" (1.568 m)    Body mass index is 49.19 kg/m.  Physical Exam  Constitutional: Patient appears well-developed and well-nourished. No distress.  HENT: Head: Normocephalic and atraumatic. Ears: B TMs ok, no erythema or effusion; Nose: Nose normal. Mouth/Throat: Oropharynx is clear and moist. No oropharyngeal exudate.  Eyes: Conjunctivae and EOM  are normal. Pupils are equal, round, and reactive to light. No scleral icterus.  Neck: Normal range of motion. Neck supple. No JVD present. No thyromegaly present.  Cardiovascular: Normal rate, regular rhythm and normal heart sounds.  No murmur heard. 1 plus  BLE edema. Pulmonary/Chest: Effort normal and breath sounds normal. No respiratory distress. Abdominal: Soft. Bowel sounds are normal, no distension. There is no tenderness. no masses Breast: no lumps or masses, no nipple discharge or rashes FEMALE GENITALIA:  External genitalia normal External urethra normal Vaginal vault normal without discharge or lesions Cervix normal without discharge or lesions Bimanual exam normal without masses RECTAL: not done Musculoskeletal: Normal range of motion, no joint effusions. No gross deformities Neurological: he is alert and oriented to person, place, and time. No cranial nerve deficit. Coordination,  balance, strength, speech and gait are normal.  Skin: Skin is warm and dry. No rash noted. No erythema.  Psychiatric: Patient has a normal mood and affect. behavior is normal. Judgment and thought content normal.  Recent Results (from the past 2160 hour(s))  Hemoglobin A1c     Status: Abnormal   Collection Time: 03/27/16 10:07 AM  Result Value Ref Range   Hgb A1c MFr Bld 6.0 (H) <5.7 %    Comment:   For someone without known diabetes, a hemoglobin A1c value between 5.7% and 6.4% is consistent with prediabetes and should be confirmed with a follow-up test.   For someone with known diabetes, a value <7% indicates that their diabetes is well controlled. A1c targets should be individualized based on duration of diabetes, age, co-morbid conditions and other considerations.   This assay result is consistent with an increased risk of diabetes.   Currently, no consensus exists regarding use of hemoglobin A1c for diagnosis of diabetes in children.      Mean Plasma Glucose 126 mg/dL  Vitamin B12      Status: None   Collection Time: 03/27/16 10:07 AM  Result Value Ref Range   Vitamin B-12 366 200 - 1,100 pg/mL  VITAMIN D 25 Hydroxy (Vit-D Deficiency, Fractures)     Status: Abnormal   Collection Time: 03/27/16 10:07 AM  Result Value Ref Range   Vit D, 25-Hydroxy 27 (L) 30 - 100 ng/mL    Comment: Vitamin D Status           25-OH Vitamin D        Deficiency                <20 ng/mL        Insufficiency         20 - 29 ng/mL        Optimal             > or = 30 ng/mL   For 25-OH Vitamin D testing on patients on D2-supplementation and patients for whom quantitation of D2 and D3 fractions is required, the QuestAssureD 25-OH VIT D, (D2,D3), LC/MS/MS is recommended: order code [id number] (patients > 2 yrs).   TSH     Status: None   Collection Time: 03/27/16 10:07 AM  Result Value Ref Range   TSH 1.44 mIU/L    Comment:   Reference Range   > or = 20 Years  0.40-4.50   Pregnancy Range First trimester  0.26-2.66 Second trimester 0.55-2.73 Third trimester  0.43-2.91     Lipid panel     Status: Abnormal   Collection Time: 03/27/16 10:07 AM  Result Value Ref Range   Cholesterol 219 (H) <200 mg/dL   Triglycerides 166 (H) <150 mg/dL   HDL 43 (L) >50 mg/dL   Total CHOL/HDL Ratio 5.1 (H) <5.0 Ratio   VLDL 33 (H) <30 mg/dL   LDL Cholesterol 143 (H) <100 mg/dL  COMPLETE METABOLIC PANEL WITH GFR     Status: Abnormal   Collection Time: 03/27/16 10:07 AM  Result Value Ref Range   Sodium 138 135 - 146 mmol/L   Potassium 4.7 3.5 - 5.3 mmol/L   Chloride 102 98 - 110 mmol/L   CO2 25 20 - 31 mmol/L   Glucose, Bld 104 (H) 65 - 99 mg/dL   BUN 12 7 - 25 mg/dL   Creat 0.69 0.50 - 1.10 mg/dL   Total Bilirubin 0.3 0.2 - 1.2 mg/dL   Alkaline Phosphatase 58 33 -  115 U/L   AST 14 10 - 30 U/L   ALT 15 6 - 29 U/L   Total Protein 7.3 6.1 - 8.1 g/dL   Albumin 4.2 3.6 - 5.1 g/dL   Calcium 9.5 8.6 - 10.2 mg/dL   GFR, Est African American >89 >=60 mL/min   GFR, Est Non African American >89 >=60 mL/min   CBC with Differential/Platelet     Status: Abnormal   Collection Time: 03/27/16 10:07 AM  Result Value Ref Range   WBC 9.2 3.8 - 10.8 K/uL   RBC 5.22 (H) 3.80 - 5.10 MIL/uL   Hemoglobin 13.6 11.7 - 15.5 g/dL   HCT 40.3 35.0 - 45.0 %   MCV 77.2 (L) 80.0 - 100.0 fL   MCH 26.1 (L) 27.0 - 33.0 pg   MCHC 33.7 32.0 - 36.0 g/dL   RDW 14.9 11.0 - 15.0 %   Platelets 310 140 - 400 K/uL   MPV 9.0 7.5 - 12.5 fL   Neutro Abs 5,520 1,500 - 7,800 cells/uL   Lymphs Abs 2,760 850 - 3,900 cells/uL   Monocytes Absolute 644 200 - 950 cells/uL   Eosinophils Absolute 184 15 - 500 cells/uL   Basophils Absolute 92 0 - 200 cells/uL   Neutrophils Relative % 60 %   Lymphocytes Relative 30 %   Monocytes Relative 7 %   Eosinophils Relative 2 %   Basophils Relative 1 %   Smear Review Criteria for review not met       PHQ2/9: Depression screen [facility] 2/9 04/27/2016 03/27/2016 11/17/2015 03/30/2015 12/28/2014  Decreased Interest 0 0 0 0 0  Down, Depressed, Hopeless 0 0 0 0 0  PHQ - 2 Score 0 0 0 0 0     Fall Risk: Fall Risk  04/27/2016 03/27/2016 11/17/2015 03/30/2015 12/28/2014  Falls in the past year? _0      Functional Status Survey: Is the patient deaf or have difficulty hearing?: No Does the patient have difficulty seeing, even when wearing glasses/contacts?: No Does the patient have difficulty concentrating, remembering, or making decisions?: No Does the patient have difficulty walking or climbing stairs?: No Does the patient have difficulty dressing or bathing?: No Does the patient have difficulty doing errands alone such as visiting a doctor's office or shopping?: No    Assessment & Plan  1. Well woman exam  Discussed importance of 150 minutes of physical activity weekly, eat two servings of fish weekly, eat one serving of tree nuts ( cashews, pistachios, pecans, almonds.Marland Kitchen) every other day, eat 6 servings of fruit/vegetables daily and drink plenty of water and avoid sweet beverages.    2. B12 deficiency  - cyanocobalamin (,VITAMIN B-12,) 1000 MCG/ML injection; Inject 1 mL (1,000 mcg total) into the muscle once.  Dispense: 1 mL; Refill: 0  3. Cervical cancer screening  - Pap IG and HPV (high risk) DNA detection  4. Breast cancer screening  - MM Digital Screening; Future

## 2016-04-27 NOTE — Patient Instructions (Signed)
Research GLP1 agonist Examples : Victoza, Ozempic, Trulicity

## 2016-04-27 NOTE — Addendum Note (Signed)
Addended by: Lolita Rieger D on: 04/27/2016 12:09 PM   Modules accepted: Orders

## 2016-05-01 LAB — PAP IG AND HPV HIGH-RISK: HPV DNA High Risk: NOT DETECTED

## 2016-05-30 ENCOUNTER — Ambulatory Visit (INDEPENDENT_AMBULATORY_CARE_PROVIDER_SITE_OTHER): Payer: BLUE CROSS/BLUE SHIELD

## 2016-05-30 DIAGNOSIS — E538 Deficiency of other specified B group vitamins: Secondary | ICD-10-CM | POA: Diagnosis not present

## 2016-05-30 MED ORDER — CYANOCOBALAMIN 1000 MCG/ML IJ SOLN
1000.0000 ug | Freq: Once | INTRAMUSCULAR | Status: AC
  Administered 2016-05-30: 1000 ug via INTRAMUSCULAR

## 2016-06-06 ENCOUNTER — Other Ambulatory Visit: Payer: Self-pay | Admitting: Family Medicine

## 2016-06-06 DIAGNOSIS — R809 Proteinuria, unspecified: Principal | ICD-10-CM

## 2016-06-06 DIAGNOSIS — J4540 Moderate persistent asthma, uncomplicated: Secondary | ICD-10-CM

## 2016-06-06 DIAGNOSIS — E1129 Type 2 diabetes mellitus with other diabetic kidney complication: Secondary | ICD-10-CM

## 2016-06-06 DIAGNOSIS — I10 Essential (primary) hypertension: Secondary | ICD-10-CM

## 2016-06-26 ENCOUNTER — Encounter: Payer: Self-pay | Admitting: Family Medicine

## 2016-06-26 ENCOUNTER — Ambulatory Visit (INDEPENDENT_AMBULATORY_CARE_PROVIDER_SITE_OTHER): Payer: BLUE CROSS/BLUE SHIELD | Admitting: Family Medicine

## 2016-06-26 VITALS — BP 128/80 | HR 65 | Temp 98.0°F | Resp 16 | Ht 62.0 in | Wt 271.4 lb

## 2016-06-26 DIAGNOSIS — I10 Essential (primary) hypertension: Secondary | ICD-10-CM | POA: Diagnosis not present

## 2016-06-26 DIAGNOSIS — E538 Deficiency of other specified B group vitamins: Secondary | ICD-10-CM

## 2016-06-26 DIAGNOSIS — E1169 Type 2 diabetes mellitus with other specified complication: Secondary | ICD-10-CM

## 2016-06-26 DIAGNOSIS — R809 Proteinuria, unspecified: Secondary | ICD-10-CM | POA: Diagnosis not present

## 2016-06-26 DIAGNOSIS — E785 Hyperlipidemia, unspecified: Secondary | ICD-10-CM

## 2016-06-26 DIAGNOSIS — E1129 Type 2 diabetes mellitus with other diabetic kidney complication: Secondary | ICD-10-CM | POA: Diagnosis not present

## 2016-06-26 DIAGNOSIS — J3089 Other allergic rhinitis: Secondary | ICD-10-CM

## 2016-06-26 DIAGNOSIS — J4540 Moderate persistent asthma, uncomplicated: Secondary | ICD-10-CM

## 2016-06-26 LAB — POCT GLYCOSYLATED HEMOGLOBIN (HGB A1C): Hemoglobin A1C: 6.2

## 2016-06-26 MED ORDER — VALSARTAN-HYDROCHLOROTHIAZIDE 160-12.5 MG PO TABS: 1.0000 | ORAL_TABLET | Freq: Every day | ORAL | 1 refills | Status: DC

## 2016-06-26 MED ORDER — BUDESONIDE-FORMOTEROL FUMARATE 160-4.5 MCG/ACT IN AERO: 2.0000 | INHALATION_SPRAY | Freq: Two times a day (BID) | RESPIRATORY_TRACT | 2 refills | Status: DC

## 2016-06-26 MED ORDER — DULAGLUTIDE 1.5 MG/0.5ML ~~LOC~~ SOAJ: 1.5000 mg | SUBCUTANEOUS | 2 refills | Status: DC

## 2016-06-26 MED ORDER — CYANOCOBALAMIN 1000 MCG/ML IJ SOLN
1000.0000 ug | Freq: Once | INTRAMUSCULAR | Status: AC
  Administered 2016-06-26: 1000 ug via INTRAMUSCULAR

## 2016-06-26 MED ORDER — MONTELUKAST SODIUM 10 MG PO TABS: 10.0000 mg | ORAL_TABLET | Freq: Every day | ORAL | 1 refills | Status: DC

## 2016-06-26 MED ORDER — FLUTICASONE PROPIONATE 50 MCG/ACT NA SUSP: 2.0000 | Freq: Every day | NASAL | 5 refills | Status: DC

## 2016-06-26 NOTE — Patient Instructions (Signed)
Please call insurance about sleep study coverageDulaglutide injection What is this medicine? DULAGLUTIDE (DOO la GLOO tide) is used to improve blood sugar control in adults with type 2 diabetes. This medicine may be used with other oral diabetes medicines. This medicine may be used for other purposes; ask your health care provider or pharmacist if you have questions. COMMON BRAND NAME(S): TRULICITY What should I tell my health care provider before I take this medicine? They need to know if you have any of these conditions: -endocrine tumors (MEN 2) or if someone in your family had these tumors -history of pancreatitis -kidney disease -liver disease -stomach problems -thyroid cancer or if someone in your family had thyroid cancer -an unusual or allergic reaction to dulaglutide, other medicines, foods, dyes, or preservatives -pregnant or trying to get pregnant -breast-feeding How should I use this medicine? This medicine is for injection under the skin of your upper leg (thigh), stomach area, or upper arm. It is usually given once every week (every 7 days). You will be taught how to prepare and give this medicine. Use exactly as directed. Take your medicine at regular intervals. Do not take it more often than directed. If you use this medicine with insulin, you should inject this medicine and the insulin separately. Do not mix them together. Do not give the injections right next to each other. Change (rotate) injection sites with each injection. It is important that you put your used needles and syringes in a special sharps container. Do not put them in a trash can. If you do not have a sharps container, call your pharmacist or healthcare provider to get one. A special MedGuide will be given to you by the pharmacist with each prescription and refill. Be sure to read this information carefully each time. Talk to your pediatrician regarding the use of this medicine in children. Special care may be  needed. Overdosage: If you think you have taken too much of this medicine contact a poison control center or emergency room at once. NOTE: This medicine is only for you. Do not share this medicine with others. What if I miss a dose? If you miss a dose, take it as soon as you can within 3 days after the missed dose. Then take your next dose at your regular weekly time. If it has been longer than 3 days after the missed dose, do not take the missed dose. Take the next dose at your regular time. Do not take double or extra doses. If you have questions about a missed dose, contact your health care provider for advice. What may interact with this medicine? -other medicines for diabetes Many medications may cause changes in blood sugar, these include: -alcohol containing beverages -antiviral medicines for HIV or AIDS -aspirin and aspirin-like drugs -certain medicines for blood pressure, heart disease, irregular heart beat -chromium -diuretics -female hormones, such as estrogens or progestins, birth control pills -fenofibrate -gemfibrozil -isoniazid -lanreotide -female hormones or anabolic steroids -MAOIs like Carbex, Eldepryl, Marplan, Nardil, and Parnate -medicines for weight loss -medicines for allergies, asthma, cold, or cough -medicines for depression, anxiety, or psychotic disturbances -niacin -nicotine -NSAIDs, medicines for pain and inflammation, like ibuprofen or naproxen -octreotide -pasireotide -pentamidine -phenytoin -probenecid -quinolone antibiotics such as ciprofloxacin, levofloxacin, ofloxacin -some herbal dietary supplements -steroid medicines such as prednisone or cortisone -sulfamethoxazole; trimethoprim -thyroid hormones Some medications can hide the warning symptoms of low blood sugar (hypoglycemia). You may need to monitor your blood sugar more closely if you are taking one  of these medications. These include: -beta-blockers, often used for high blood pressure or  heart problems (examples include atenolol, metoprolol, propranolol) -clonidine -guanethidine -reserpine This list may not describe all possible interactions. Give your health care provider a list of all the medicines, herbs, non-prescription drugs, or dietary supplements you use. Also tell them if you smoke, drink alcohol, or use illegal drugs. Some items may interact with your medicine. What should I watch for while using this medicine? Visit your doctor or health care professional for regular checks on your progress. Drink plenty of fluids while taking this medicine. Check with your doctor or health care professional if you get an attack of severe diarrhea, nausea, and vomiting. The loss of too much body fluid can make it dangerous for you to take this medicine. A test called the HbA1C (A1C) will be monitored. This is a simple blood test. It measures your blood sugar control over the last 2 to 3 months. You will receive this test every 3 to 6 months. Learn how to check your blood sugar. Learn the symptoms of low and high blood sugar and how to manage them. Always carry a quick-source of sugar with you in case you have symptoms of low blood sugar. Examples include hard sugar candy or glucose tablets. Make sure others know that you can choke if you eat or drink when you develop serious symptoms of low blood sugar, such as seizures or unconsciousness. They must get medical help at once. Tell your doctor or health care professional if you have high blood sugar. You might need to change the dose of your medicine. If you are sick or exercising more than usual, you might need to change the dose of your medicine. Do not skip meals. Ask your doctor or health care professional if you should avoid alcohol. Many nonprescription cough and cold products contain sugar or alcohol. These can affect blood sugar. Pens should never be shared. Even if the needle is changed, sharing may result in passing of viruses like  hepatitis or HIV. Wear a medical ID bracelet or chain, and carry a card that describes your disease and details of your medicine and dosage times. What side effects may I notice from receiving this medicine? Side effects that you should report to your doctor or health care professional as soon as possible: -allergic reactions like skin rash, itching or hives, swelling of the face, lips, or tongue -breathing problems -diarrhea that continues or is severe -lump or swelling on the neck -severe nausea -signs and symptoms of infection like fever or chills; cough; sore throat; pain or trouble passing urine -signs and symptoms of low blood sugar such as feeling anxious, confusion, dizziness, increased hunger, unusually weak or tired, sweating, shakiness, cold, irritable, headache, blurred vision, fast heartbeat, loss of consciousness -signs and symptoms of kidney injury like trouble passing urine or change in the amount of urine -trouble swallowing -unusual stomach upset or pain -vomiting Side effects that usually do not require medical attention (report to your doctor or health care professional if they continue or are bothersome): -diarrhea -loss of appetite -nausea -pain, redness, or irritation at site where injected -stomach upset This list may not describe all possible side effects. Call your doctor for medical advice about side effects. You may report side effects to FDA at 1-800-FDA-1088. Where should I keep my medicine? Keep out of the reach of children. Store unopened pens in a refrigerator between 2 and 8 degrees C (36 and 46 degrees F). Do  not freeze or use if the medicine has been frozen. Protect from light and excessive heat. Store in the carton until use. Each single-dose pen can be kept at room temperature, not to exceed 30 degrees C (86 degrees F) for a total of 14 days, if needed. Throw away any unused medicine after the expiration date on the label. NOTE: This sheet is a summary.  It may not cover all possible information. If you have questions about this medicine, talk to your doctor, pharmacist, or health care provider.  2018 [employer]vier/Gold Standard (2016-03-08 14:35:01)

## 2016-06-26 NOTE — Progress Notes (Signed)
Name: Kelsey Alvarez   MRN: [id number]    DOB: Apr 26, 1970   Date:06/26/2016       Progress Note  Subjective  Chief Complaint  Chief Complaint  Patient presents with  . Diabetes    patient checks her blood sugar a couple of times per week. no issues with it, pretty stable. around 120-125.  Marland Kitchen Hypertension    patient has not checked it in a while. last time it was 135/87. some headaches, but she thinks it's due to the new cholesterol medication  . Hyperlipidemia    joint aches and swelling in the lower extremities.  . Asthma    sob due to the pollen in the air  . Obesity    patient is still trying to lose weight  . Fatigue    persists  . Back Pain    not as bad  . Medication Refill    patient needs refill on everything but is not completely out at the moment  . Numbness    patient stated that the numbness in her hands and feet continues, but it is not as intense  . Abdominal Pain    patient has been having some intermittent umbilical pain.     HPI  Hyperlipidemia: she is back on statin therapy, and is taking at night so headache is not as bad, only when she first wakes up ( could not tolerate taking in am's because it caused severe headache)  Morbid Obesity: she has gained another 8lbs since last visit,  she is still drinking sodas but down to 12  ounces daily, she has been adding more water, and stopped drinking Lemonade. Discussed importance of eliminating any sweet beverages.   Endometriosis: seen by Dr. Glennon Mac and had a cyst left ovary, s/p left oophorectomy and bilateral salpingectomy - pathology showed endometriosis. She did well post-op ( surgery 10/2015 ). She also had a D&C for dysfuncional bleeding, she states she still has cycles, regular, but not heavy since procedure and pain has resolved. Occasionally having peri-umbilical pain, described sharp and brief, explained it may be scar tissue, advised her to monitor for now. Random episodes. No redness or increase in  warmth.  DM: glucose at home has been around 120's, occasionally goes up 130's. She has noticed polyphagia, polydipsia and polyuria. She is due for labs and eye exam. She has been taking Metformin as prescribed, but hgbA1C is going up. She has proteinuria and is on ARB. She has also noticed tingling on both hands, and both feet intermittently. It can happen during rest or activity. She has been taking B12 but is not much better, she likely has neuropathy but does not want to add another medication at this time.   Asthma Moderate persistent: symptoms have been worse over the past couple of weeks, seems to be triggered by elevated pollen count. She has used rescue three times this past week. Using Symbicort as prescribed. Having more watery and itchy eyes, nasal congestion.  KVQ:QVZDGL bp medication daily, no chest pain or palpitation  Back pain with radiculitis: she has noticed pain on left lower back over the past month, radiates down to left buttocks and across her anterior left thigh down to her foot, but very seldom now, doing better. Described as throbbing, intermittent, can last hours, it causes her to limp, worse with activity and better with rest.    Fatigue: she has been feeling very tired lately, she snores, discussed sleep study, but she wants to check with her insurance  first. IT may also be multifactorial.    Patient Active Problem List   Diagnosis Date Noted  . B12 deficiency 04/27/2016  . History of endometriosis 11/17/2015  . Menorrhagia with regular cycle 10/12/2015  . Benign essential HTN 12/28/2014  . Cervical polyp 12/28/2014  . Controlled type 2 diabetes mellitus with microalbuminuria ([place]) 12/28/2014  . Dyslipidemia 12/28/2014  . Dysmenorrhea 12/28/2014  . Edema 12/28/2014  . Female infertility 12/28/2014  . Gastro-esophageal reflux disease without esophagitis 12/28/2014  . Morbid obesity ([place]) 12/28/2014  . Asthma, moderate persistent, poorly-controlled  12/28/2014  . Perennial allergic rhinitis 12/28/2014  . Intermittent tremor 12/28/2014  . Vitamin D deficiency 12/28/2014  . LBP (low back pain) 12/28/2014  . Left sciatic nerve pain 12/28/2014    Past Surgical History:  Procedure Laterality Date  . APPENDECTOMY    . cervical polyp removal    . DILATION AND CURETTAGE OF UTERUS    . HYSTEROSCOPY    . HYSTEROSCOPY W/D&C N/A 10/12/2015   Procedure: DILATATION AND CURETTAGE /HYSTEROSCOPY;  Surgeon: Will Bonnet, MD;  Location: [facility];  Service: Gynecology;  Laterality: N/A;  . LAPAROSCOPIC BILATERAL SALPINGO OOPHERECTOMY N/A 10/12/2015   Procedure: LAPAROSCOPIC RIGHT SALPINGECTOMY, LEFT OOPHORECTOMY;  Surgeon: Will Bonnet, MD;  Location: [facility];  Service: Gynecology;  Laterality: N/A;  . LAPAROSCOPIC OVARIAN CYSTECTOMY Bilateral 10/12/2015   Procedure: LAPAROSCOPIC OVARIAN CYSTECTOMY;  Surgeon: Will Bonnet, MD;  Location: [facility];  Service: Gynecology;  Laterality: Bilateral;  . OOPHORECTOMY    . OVARIAN CYST REMOVAL      Family History  Problem Relation Age of Onset  . Hypertension Mother   . Diabetes Mother   . CAD Mother   . Cancer Mother     cervical  . Heart attack Mother 33    Triple Bypass  . CAD Father   . Hypertension Father   . Arthritis Father   . Thyroid disease Sister   . Alzheimer's disease Maternal Grandmother   . Alzheimer's disease Maternal Grandfather   . Breast cancer Paternal Grandmother     Social History   Social History  . Marital status: Married    Spouse name: N/A  . Number of children: N/A  . Years of education: N/A   Occupational History  . Museum/gallery curator    Social History Main Topics  . Smoking status: Former Smoker    Years: 1.00    Types: Cigarettes    Start date: 12/03/2001    Quit date: 12/28/2002  . Smokeless tobacco: Never Used     Comment: smoked 3 to 4 cigarrettes a day for a year  . Alcohol use 0.0 oz/week     Comment: occasionally drinks margarita, once or  twice a year  . Drug use: No  . Sexual activity: Yes    Partners: Male     Comment: Partial Hysterectomy   Other Topics Concern  . Not on file   Social History Narrative  . No narrative on file     Current Outpatient Prescriptions:  .  atorvastatin (LIPITOR) 40 MG tablet, Take 1 tablet (40 mg total) by mouth daily., Disp: 90 tablet, Rfl: 1 .  budesonide-formoterol (SYMBICORT) 160-4.5 MCG/ACT inhaler, Inhale 2 puffs into the lungs 2 (two) times daily., Disp: 1 Inhaler, Rfl: 2 .  Cholecalciferol (VITAMIN D) 2000 UNITS tablet, Take 1 tablet by mouth daily., Disp: , Rfl:  .  fluticasone (FLONASE) 50 MCG/ACT nasal spray, Place 2 sprays into both nostrils daily., Disp: 16 g,  Rfl: 5 .  levocetirizine (XYZAL) 5 MG tablet, Take 5 mg by mouth every evening., Disp: , Rfl:  .  montelukast (SINGULAIR) 10 MG tablet, Take 1 tablet (10 mg total) by mouth at bedtime., Disp: 90 tablet, Rfl: 1 .  PROAIR HFA 108 (90 Base) MCG/ACT inhaler, INHALE TWO PUFFS BY MOUTH EVERY 6 HOURS AS NEEDED FOR WHEEZING OR  SHORTNESS  OF  BREATH, Disp: 9 each, Rfl: 0 .  valsartan-hydrochlorothiazide (DIOVAN-HCT) 160-12.5 MG tablet, Take 1 tablet by mouth daily., Disp: 90 tablet, Rfl: 1 .  Dulaglutide (TRULICITY) 1.5 CB/4.4HQ SOPN, Inject 1.5 mg into the skin once a week., Disp: 4 pen, Rfl: 2  Allergies  Allergen Reactions  . Fish Allergy Shortness Of Breath  . Penicillins Anaphylaxis and Rash    Childhood allergy  . Adhesive [Tape] Rash    Paper tape ok to use.     ROS  Constitutional: Negative for fever , positive for  weight change.  Respiratory: Positive  For dry  cough and shortness of breath.   Cardiovascular: Negative for chest pain or palpitations.  Gastrointestinal: Negative for abdominal pain, no bowel changes.  Musculoskeletal: Positive  for intermittent gait problem , positive for left knee  joint swelling.  Skin: Negative for rash.  Neurological: Negative for dizziness or headache.  No other specific  complaints in a complete review of systems (except as listed in HPI above).  Objective  Vitals:   06/26/16 0907  BP: 128/80  Pulse: 65  Resp: 16  Temp: 98 F (36.7 C)  TempSrc: Oral  SpO2: 96%  Weight: 271 lb 6.4 oz (123.1 kg)  Height: 5\' 2"  (1.575 m)    Body mass index is 49.64 kg/m.  Physical Exam  Constitutional: Patient appears well-developed and well-nourished. Obese  No distress.  HEENT: head atraumatic, normocephalic, pupils equal and reactive to light, neck supple, throat within normal limits Cardiovascular: Normal rate, regular rhythm and normal heart sounds.  No murmur heard. No BLE edema. Pulmonary/Chest: Effort normal and breath sounds normal. No respiratory distress. Abdominal: Soft.  There is no tenderness. Psychiatric: Patient has a normal mood and affect. behavior is normal. Judgment and thought content normal.  Recent Results (from the past 2160 hour(s))  Pap IG and HPV (high risk) DNA detection     Status: None   Collection Time: 04/27/16 12:15 PM  Result Value Ref Range   HPV DNA High Risk Not Detected     Comment: HIGH RISK HPV types (16,18,31,33,35,39,45,51,52,56,58,59,66,68) were not detected. Other HPV types which cause anogenital lesions may be present. The significance of the other types of HPV in malignant  processes has not been established.                  ** Normal Reference Range: Not Detected **      HPV High Risk testing performed using the APTIMA HPV mRNA Assay.      Specimen adequacy:      Comment: SATISFACTORY.  Endocervical/transformation zone component present.   FINAL DIAGNOSIS:      Comment: - NEGATIVE FOR INTRAEPITHELIAL LESIONS OR MALIGNANCY.    COMMENTS:      Comment: This Pap test has been evaluated with computer assisted technology.   Cytotechnologist:      Comment: EW, BA CT(ASCP) *  The Pap is a screening test for cervical cancer. It is not a  diagnostic test and is subject to false negative and false positive   results. It is most reliable when a  satisfactory sample, regularly  obtained, is submitted with relevant clinical findings and history,  and when the Pap result is evaluated along with historic and current  clinical information.   POCT glycosylated hemoglobin (Hb A1C)     Status: Normal   Collection Time: 06/26/16  9:20 AM  Result Value Ref Range   Hemoglobin A1C 6.2       PHQ2/9: Depression screen [facility] 2/9 06/26/2016 04/27/2016 03/27/2016 11/17/2015 03/30/2015  Decreased Interest 0 0 0 0 0  Down, Depressed, Hopeless 0 0 0 0 0  PHQ - 2 Score 0 0 0 0 0     Fall Risk: Fall Risk  06/26/2016 04/27/2016 03/27/2016 11/17/2015 03/30/2015  Falls in the past year? No No No No No    Functional Status Survey: Is the patient deaf or have difficulty hearing?: No Does the patient have difficulty seeing, even when wearing glasses/contacts?: No Does the patient have difficulty concentrating, remembering, or making decisions?: No Does the patient have difficulty walking or climbing stairs?: Yes (due to knees) Does the patient have difficulty dressing or bathing?: No Does the patient have difficulty doing errands alone such as visiting a doctor's office or shopping?: No   Assessment & Plan  1. Controlled type 2 diabetes mellitus with microalbuminuria, without long-term current use of insulin ([place])  hgbA1C is trending up , still at goal, struggling with her weight also, she has read about Glp-1 and is interested in Victoza but does not seem to be covered by her insurance. Discussed risk of Trulicity and possible side effects. First dose given in the office today - POCT glycosylated hemoglobin (Hb A1C) - valsartan-hydrochlorothiazide (DIOVAN-HCT) 160-12.5 MG tablet; Take 1 tablet by mouth daily.  Dispense: 90 tablet; Refill: 1 - Dulaglutide (TRULICITY) 1.5 HU/7.6LY SOPN; Inject 1.5 mg into the skin once a week.  Dispense: 4 pen; Refill: 2  2. Dyslipidemia associated with type 2 diabetes mellitus  (HCC)  - Dulaglutide (TRULICITY) 1.5 YT/0.3TW SOPN; Inject 1.5 mg into the skin once a week.  Dispense: 4 pen; Refill: 2  3. Benign essential HTN  At goal  - valsartan-hydrochlorothiazide (DIOVAN-HCT) 160-12.5 MG tablet; Take 1 tablet by mouth daily.  Dispense: 90 tablet; Refill: 1  4. Morbid obesity due to excess calories [facility])  Discussed with the patient the risk posed by an increased BMI. Discussed importance of portion control, calorie counting and at least 150 minutes of physical activity weekly. Avoid sweet beverages and drink more water. Eat at least 6 servings of fruit and vegetables daily   5. Dyslipidemia  Lipid panel was not at goal, she has to change her diet, continue statin therapy   6. Asthma, moderate persistent, well-controlled  - budesonide-formoterol (SYMBICORT) 160-4.5 MCG/ACT inhaler; Inhale 2 puffs into the lungs 2 (two) times daily.  Dispense: 1 Inhaler; Refill: 2 - montelukast (SINGULAIR) 10 MG tablet; Take 1 tablet (10 mg total) by mouth at bedtime.  Dispense: 90 tablet; Refill: 1  7. B12 deficiency  - cyanocobalamin ((VITAMIN B-12)) injection 1,000 mcg; Inject 1 mL (1,000 mcg total) into the muscle once.  8. Perennial allergic rhinitis  - fluticasone (FLONASE) 50 MCG/ACT nasal spray; Place 2 sprays into both nostrils daily.  Dispense: 16 g; Refill: 5

## 2016-07-19 ENCOUNTER — Ambulatory Visit (INDEPENDENT_AMBULATORY_CARE_PROVIDER_SITE_OTHER): Payer: BLUE CROSS/BLUE SHIELD | Admitting: Family Medicine

## 2016-07-19 ENCOUNTER — Encounter: Payer: Self-pay | Admitting: Family Medicine

## 2016-07-19 VITALS — BP 130/68 | HR 64 | Temp 97.8°F | Resp 16 | Ht 62.0 in | Wt 268.4 lb

## 2016-07-19 DIAGNOSIS — E1129 Type 2 diabetes mellitus with other diabetic kidney complication: Secondary | ICD-10-CM

## 2016-07-19 DIAGNOSIS — R809 Proteinuria, unspecified: Secondary | ICD-10-CM | POA: Diagnosis not present

## 2016-07-19 DIAGNOSIS — E1169 Type 2 diabetes mellitus with other specified complication: Secondary | ICD-10-CM | POA: Diagnosis not present

## 2016-07-19 DIAGNOSIS — M25572 Pain in left ankle and joints of left foot: Secondary | ICD-10-CM | POA: Diagnosis not present

## 2016-07-19 DIAGNOSIS — E785 Hyperlipidemia, unspecified: Secondary | ICD-10-CM

## 2016-07-19 MED ORDER — SEMAGLUTIDE(0.25 OR 0.5MG/DOS) 2 MG/1.5ML ~~LOC~~ SOPN: 0.2500 mg | PEN_INJECTOR | SUBCUTANEOUS | 2 refills | Status: DC

## 2016-07-19 NOTE — Addendum Note (Signed)
Addended by: Inda Coke on: 07/19/2016 10:29 AM   Modules accepted: Orders

## 2016-07-19 NOTE — Progress Notes (Signed)
Name: Kelsey Alvarez   MRN: [id number]    DOB: 12-15-1970   Date:07/19/2016       Progress Note  Subjective  Chief Complaint  Chief Complaint  Patient presents with  . Follow-up    1 month F/U  . Diabetes    Patient states she tried Trulicity and has been very sick, nausea. Sugar was dropping and turning pale, only could take 1 shot.    HPI   DM: glucose at home has been around 120's.She has noticed polyphagia, polydipsia and polyuria. She is due for labs and eye exam. She was switched to Trulicity on her last visit ( one month ago) but developed nausea and could not eat anything, explained it was a normal side effects. She has proteinuria and is on ARB. She has also noticed tingling on both hands, and both feet intermittently. It can happen during rest or activity. She has been taking B12 but is not much better, she likely has neuropathy but does not want to add another medication at this time.   Morbid Obesity: she lost 3 lbs since last visit.  She switched to diet drinks, still not exercising. She tried Trulicity but could not tolerated it but willing to try something else on a lower dose.    Patient Active Problem List   Diagnosis Date Noted  . B12 deficiency 04/27/2016  . History of endometriosis 11/17/2015  . Menorrhagia with regular cycle 10/12/2015  . Benign essential HTN 12/28/2014  . Cervical polyp 12/28/2014  . Controlled type 2 diabetes mellitus with microalbuminuria ([place]) 12/28/2014  . Dyslipidemia 12/28/2014  . Dysmenorrhea 12/28/2014  . Edema 12/28/2014  . Female infertility 12/28/2014  . Gastro-esophageal reflux disease without esophagitis 12/28/2014  . Morbid obesity ([place]) 12/28/2014  . Asthma, moderate persistent, poorly-controlled 12/28/2014  . Perennial allergic rhinitis 12/28/2014  . Intermittent tremor 12/28/2014  . Vitamin D deficiency 12/28/2014  . LBP (low back pain) 12/28/2014  . Left sciatic nerve pain 12/28/2014    Past Surgical History:   Procedure Laterality Date  . APPENDECTOMY    . cervical polyp removal    . DILATION AND CURETTAGE OF UTERUS    . HYSTEROSCOPY    . HYSTEROSCOPY W/D&C N/A 10/12/2015   Procedure: DILATATION AND CURETTAGE /HYSTEROSCOPY;  Surgeon: Will Bonnet, MD;  Location: [facility] ORS;  Service: Gynecology;  Laterality: N/A;  . LAPAROSCOPIC BILATERAL SALPINGO OOPHERECTOMY N/A 10/12/2015   Procedure: LAPAROSCOPIC RIGHT SALPINGECTOMY, LEFT OOPHORECTOMY;  Surgeon: Will Bonnet, MD;  Location: [facility] ORS;  Service: Gynecology;  Laterality: N/A;  . LAPAROSCOPIC OVARIAN CYSTECTOMY Bilateral 10/12/2015   Procedure: LAPAROSCOPIC OVARIAN CYSTECTOMY;  Surgeon: Will Bonnet, MD;  Location: [facility] ORS;  Service: Gynecology;  Laterality: Bilateral;  . OOPHORECTOMY    . OVARIAN CYST REMOVAL      Family History  Problem Relation Age of Onset  . Hypertension Mother   . Diabetes Mother   . CAD Mother   . Cancer Mother        cervical  . Heart attack Mother 68       Triple Bypass  . CAD Father   . Hypertension Father   . Arthritis Father   . Thyroid disease Sister   . Alzheimer's disease Maternal Grandmother   . Alzheimer's disease Maternal Grandfather   . Breast cancer Paternal Grandmother     Social History   Social History  . Marital status: Married    Spouse name: N/A  . Number of children: N/A  . Years  of education: N/A   Occupational History  . Museum/gallery curator    Social History Main Topics  . Smoking status: Former Smoker    Years: 1.00    Types: Cigarettes    Start date: 12/03/2001    Quit date: 12/28/2002  . Smokeless tobacco: Never Used     Comment: smoked 3 to 4 cigarrettes a day for a year  . Alcohol use 0.0 oz/week     Comment: occasionally drinks margarita, once or twice a year  . Drug use: No  . Sexual activity: Yes    Partners: Male     Comment: Partial Hysterectomy   Other Topics Concern  . Not on file   Social History Narrative  . No narrative on file     Current  Outpatient Prescriptions:  .  atorvastatin (LIPITOR) 40 MG tablet, Take 1 tablet (40 mg total) by mouth daily., Disp: 90 tablet, Rfl: 1 .  budesonide-formoterol (SYMBICORT) 160-4.5 MCG/ACT inhaler, Inhale 2 puffs into the lungs 2 (two) times daily., Disp: 1 Inhaler, Rfl: 2 .  Cholecalciferol (VITAMIN D) 2000 UNITS tablet, Take 1 tablet by mouth daily., Disp: , Rfl:  .  fluticasone (FLONASE) 50 MCG/ACT nasal spray, Place 2 sprays into both nostrils daily., Disp: 16 g, Rfl: 5 .  levocetirizine (XYZAL) 5 MG tablet, Take 5 mg by mouth every evening., Disp: , Rfl:  .  montelukast (SINGULAIR) 10 MG tablet, Take 1 tablet (10 mg total) by mouth at bedtime., Disp: 90 tablet, Rfl: 1 .  PROAIR HFA 108 (90 Base) MCG/ACT inhaler, INHALE TWO PUFFS BY MOUTH EVERY 6 HOURS AS NEEDED FOR WHEEZING OR  SHORTNESS  OF  BREATH, Disp: 9 each, Rfl: 0 .  valsartan-hydrochlorothiazide (DIOVAN-HCT) 160-12.5 MG tablet, Take 1 tablet by mouth daily., Disp: 90 tablet, Rfl: 1 .  Semaglutide (OZEMPIC) 0.25 or 0.5 MG/DOSE SOPN, Inject 0.25-0.5 mg into the skin once a week., Disp: 2 pen, Rfl: 2  Allergies  Allergen Reactions  . Fish Allergy Shortness Of Breath  . Penicillins Anaphylaxis and Rash    Childhood allergy  . Adhesive [Tape] Rash    Paper tape ok to use.     ROS  Constitutional: Negative for fever , positive for mild  weight change.  Respiratory: Negative for cough and shortness of breath.   Cardiovascular: Negative for chest pain or palpitations.  Gastrointestinal: Negative for abdominal pain, no bowel changes.  Musculoskeletal: Negative for gait problem or joint swelling.  Skin: Negative for rash.  Neurological: Negative for dizziness or headache.  No other specific complaints in a complete review of systems (except as listed in HPI above).  Objective  Vitals:   07/19/16 0942  BP: 130/68  Pulse: 64  Resp: 16  Temp: 97.8 F (36.6 C)  TempSrc: Oral  SpO2: 96%  Weight: 268 lb 6.4 oz (121.7 kg)   Height: 5\' 2"  (1.575 m)    Body mass index is 49.09 kg/m.  Physical Exam  Constitutional: Patient appears well-developed and well-nourished. Obese No distress.  HEENT: head atraumatic, normocephalic, pupils equal and reactive to light,  neck supple, throat within normal limits Cardiovascular: Normal rate, regular rhythm and normal heart sounds.  No murmur heard. No BLE edema. Pulmonary/Chest: Effort normal and breath sounds normal. No respiratory distress. Abdominal: Soft.  There is no tenderness. Increase in abdominal girth Psychiatric: Patient has a normal mood and affect. behavior is normal. Judgment and thought content normal.  Recent Results (from the past 2160 hour(s))  Pap IG and  HPV (high risk) DNA detection     Status: None   Collection Time: 04/27/16 12:15 PM  Result Value Ref Range   HPV DNA High Risk Not Detected     Comment: HIGH RISK HPV types (16,18,31,33,35,39,45,51,52,56,58,59,66,68) were not detected. Other HPV types which cause anogenital lesions may be present. The significance of the other types of HPV in malignant  processes has not been established.                  ** Normal Reference Range: Not Detected **      HPV High Risk testing performed using the APTIMA HPV mRNA Assay.      Specimen adequacy:      Comment: SATISFACTORY.  Endocervical/transformation zone component present.   FINAL DIAGNOSIS:      Comment: - NEGATIVE FOR INTRAEPITHELIAL LESIONS OR MALIGNANCY.    COMMENTS:      Comment: This Pap test has been evaluated with computer assisted technology.   Cytotechnologist:      Comment: EW, BA CT(ASCP) *  The Pap is a screening test for cervical cancer. It is not a  diagnostic test and is subject to false negative and false positive  results. It is most reliable when a satisfactory sample, regularly  obtained, is submitted with relevant clinical findings and history,  and when the Pap result is evaluated along with historic and current   clinical information.   POCT glycosylated hemoglobin (Hb A1C)     Status: Normal   Collection Time: 06/26/16  9:20 AM  Result Value Ref Range   Hemoglobin A1C 6.2       PHQ2/9: Depression screen [facility] 2/9 06/26/2016 04/27/2016 03/27/2016 11/17/2015 03/30/2015  Decreased Interest 0 0 0 0 0  Down, Depressed, Hopeless 0 0 0 0 0  PHQ - 2 Score 0 0 0 0 0     Fall Risk: Fall Risk  06/26/2016 04/27/2016 03/27/2016 11/17/2015 03/30/2015  Falls in the past year? No No No No No      Assessment & Plan  1. Controlled type 2 diabetes mellitus with microalbuminuria, without long-term current use of insulin (HCC)  - Semaglutide (OZEMPIC) 0.25 or 0.5 MG/DOSE SOPN; Inject 0.25-0.5 mg into the skin once a week.  Dispense: 2 pen; Refill: 2  2. Dyslipidemia associated with type 2 diabetes mellitus (HCC)  - Semaglutide (OZEMPIC) 0.25 or 0.5 MG/DOSE SOPN; Inject 0.25-0.5 mg into the skin once a week.  Dispense: 2 pen; Refill: 2  3. Morbid obesity due to excess calories (HCC)  - Semaglutide (OZEMPIC) 0.25 or 0.5 MG/DOSE SOPN; Inject 0.25-0.5 mg into the skin once a week.  Dispense: 2 pen; Refill: 2

## 2016-07-24 LAB — HM DIABETES EYE EXAM

## 2016-08-02 ENCOUNTER — Encounter: Payer: Self-pay | Admitting: Family Medicine

## 2016-08-14 ENCOUNTER — Encounter: Payer: Self-pay | Admitting: Podiatry

## 2016-08-14 ENCOUNTER — Ambulatory Visit (INDEPENDENT_AMBULATORY_CARE_PROVIDER_SITE_OTHER): Payer: BLUE CROSS/BLUE SHIELD | Admitting: Podiatry

## 2016-08-14 ENCOUNTER — Ambulatory Visit (INDEPENDENT_AMBULATORY_CARE_PROVIDER_SITE_OTHER): Payer: BLUE CROSS/BLUE SHIELD

## 2016-08-14 DIAGNOSIS — M659 Synovitis and tenosynovitis, unspecified: Secondary | ICD-10-CM | POA: Diagnosis not present

## 2016-08-14 DIAGNOSIS — M25579 Pain in unspecified ankle and joints of unspecified foot: Secondary | ICD-10-CM

## 2016-08-23 NOTE — Progress Notes (Signed)
   Subjective:  Patient presents today for intermittent aching, throbbing pain and tenderness to the left lateral ankle that has been ongoing for the past year. Patient states he sprained his ankle 1 year ago and has had pain since. There are no modifying factors noted. He has not done anything to treat the symptoms. Patient presents for further treatment and evaluation.  Objective / Physical Exam:  General:  The patient is alert and oriented x3 in no acute distress. Dermatology:  Skin is warm, dry and supple bilateral lower extremities. Negative for open lesions or macerations. Vascular:  Palpable pedal pulses bilaterally. No edema or erythema noted. Capillary refill within normal limits. Neurological:  Epicritic and protective threshold grossly intact bilaterally.  Musculoskeletal Exam:  Pain on palpation to the anterior lateral medial aspects of the patient's left ankle. Mild edema noted.  Range of motion within normal limits to all pedal and ankle joints bilateral. Muscle strength 5/5 in all groups bilateral.   Radiographic Exam:  Normal osseous mineralization. Joint spaces preserved. No fracture/dislocation/boney destruction.    Assessment: #1 pain in left ankle #2 synovitis of left ankle  Plan of Care:  #1 Patient was evaluated. X-rays reviewed. #2 injection of 0.5 mL Celestone Soluspan injected in the patient's left ankle. #3 ankle brace dispensed. #4 return to clinic in 4 weeks.    Edrick Kins, DPM [facility]  Dr. Edrick Kins, [address]                                        [place], [place] [address]                Office [phone]  Fax [phone]

## 2016-08-27 MED ORDER — BETAMETHASONE SOD PHOS & ACET 6 (3-3) MG/ML IJ SUSP: 3.0000 mg | Freq: Once | INTRAMUSCULAR | Status: DC

## 2016-09-11 ENCOUNTER — Encounter: Payer: Self-pay | Admitting: Podiatry

## 2016-09-11 ENCOUNTER — Ambulatory Visit (INDEPENDENT_AMBULATORY_CARE_PROVIDER_SITE_OTHER): Payer: BLUE CROSS/BLUE SHIELD | Admitting: Podiatry

## 2016-09-11 DIAGNOSIS — M659 Synovitis and tenosynovitis, unspecified: Secondary | ICD-10-CM

## 2016-09-18 ENCOUNTER — Ambulatory Visit: Payer: BLUE CROSS/BLUE SHIELD | Admitting: Family Medicine

## 2016-09-29 MED ORDER — BETAMETHASONE SOD PHOS & ACET 6 (3-3) MG/ML IJ SUSP: 3.0000 mg | Freq: Once | INTRAMUSCULAR | Status: DC

## 2016-09-29 NOTE — Progress Notes (Signed)
   Subjective:  Patient presents today for follow-up treatment and evaluation of left ankle pain with synovitis. Patient states that she's doing a proximal 70% better. Patient believes that the injection ankle brace have been helping significantly. Patient presents today for follow-up treatment and evaluation  Objective / Physical Exam:  General:  The patient is alert and oriented x3 in no acute distress. Dermatology:  Skin is warm, dry and supple bilateral lower extremities. Negative for open lesions or macerations. Vascular:  Palpable pedal pulses bilaterally. No edema or erythema noted. Capillary refill within normal limits. Neurological:  Epicritic and protective threshold grossly intact bilaterally.  Musculoskeletal Exam:  Improved pain on palpation to the anterior lateral medial aspects of the patient's left ankle. Mild edema noted.  Range of motion within normal limits to all pedal and ankle joints bilateral. Muscle strength 5/5 in all groups bilateral.   Assessment: #1 pain in left ankle #2 synovitis of left ankle #3 capsulitis of left ankle  Plan of Care:  #1 Patient was evaluated. #2 injection of 0.5 mL Celestone Soluspan injected in the patient's left ankle. #3 continue wearing supportive ankle brace #4 return to clinic when necessary  Edrick Kins, DPM [facility]  Dr. Edrick Kins, [place]                                        [place], [place] [address]                Office [phone]  Fax [phone]

## 2016-10-03 ENCOUNTER — Other Ambulatory Visit: Payer: Self-pay | Admitting: Family Medicine

## 2016-10-03 ENCOUNTER — Telehealth: Payer: Self-pay

## 2016-10-03 MED ORDER — OLMESARTAN MEDOXOMIL-HCTZ 20-12.5 MG PO TABS: 1.0000 | ORAL_TABLET | Freq: Every day | ORAL | 2 refills | Status: DC

## 2016-10-03 NOTE — Telephone Encounter (Signed)
[facility] faxed [place] Valsartan/HCTZ is on recall from the manufacturer and asking we please send in a new prescription. Thanks

## 2016-10-09 ENCOUNTER — Ambulatory Visit (INDEPENDENT_AMBULATORY_CARE_PROVIDER_SITE_OTHER): Payer: BLUE CROSS/BLUE SHIELD | Admitting: Family Medicine

## 2016-10-09 ENCOUNTER — Encounter: Payer: Self-pay | Admitting: Family Medicine

## 2016-10-09 VITALS — BP 108/62 | HR 58 | Temp 98.0°F | Resp 16 | Ht 62.0 in | Wt 259.7 lb

## 2016-10-09 DIAGNOSIS — E538 Deficiency of other specified B group vitamins: Secondary | ICD-10-CM | POA: Diagnosis not present

## 2016-10-09 DIAGNOSIS — E1129 Type 2 diabetes mellitus with other diabetic kidney complication: Secondary | ICD-10-CM

## 2016-10-09 DIAGNOSIS — R809 Proteinuria, unspecified: Secondary | ICD-10-CM | POA: Diagnosis not present

## 2016-10-09 DIAGNOSIS — E785 Hyperlipidemia, unspecified: Secondary | ICD-10-CM | POA: Diagnosis not present

## 2016-10-09 DIAGNOSIS — I10 Essential (primary) hypertension: Secondary | ICD-10-CM

## 2016-10-09 DIAGNOSIS — E1169 Type 2 diabetes mellitus with other specified complication: Secondary | ICD-10-CM

## 2016-10-09 DIAGNOSIS — J4540 Moderate persistent asthma, uncomplicated: Secondary | ICD-10-CM

## 2016-10-09 LAB — COMPLETE METABOLIC PANEL WITH GFR
ALBUMIN: 4.2 g/dL (ref 3.6–5.1)
ALT: 15 U/L (ref 6–29)
AST: 13 U/L (ref 10–30)
Alkaline Phosphatase: 71 U/L (ref 33–115)
BILIRUBIN TOTAL: 0.5 mg/dL (ref 0.2–1.2)
BUN: 13 mg/dL (ref 7–25)
CO2: 25 mmol/L (ref 20–32)
Calcium: 9.6 mg/dL (ref 8.6–10.2)
Chloride: 102 mmol/L (ref 98–110)
Creat: 0.7 mg/dL (ref 0.50–1.10)
GLUCOSE: 92 mg/dL (ref 65–99)
Potassium: 4 mmol/L (ref 3.5–5.3)
SODIUM: 140 mmol/L (ref 135–146)
TOTAL PROTEIN: 7.1 g/dL (ref 6.1–8.1)

## 2016-10-09 LAB — LIPID PANEL
CHOL/HDL RATIO: 4.6 ratio (ref <5.0)
Cholesterol: 206 mg/dL — ABNORMAL HIGH (ref <200)
HDL: 45 mg/dL — AB (ref >50)
LDL CALC: 138 mg/dL — AB (ref <100)
Triglycerides: 114 mg/dL (ref <150)
VLDL: 23 mg/dL (ref <30)

## 2016-10-09 MED ORDER — MONTELUKAST SODIUM 10 MG PO TABS: 10.0000 mg | ORAL_TABLET | Freq: Every day | ORAL | 1 refills | Status: DC

## 2016-10-09 MED ORDER — ATORVASTATIN CALCIUM 40 MG PO TABS: 40.0000 mg | ORAL_TABLET | Freq: Every day | ORAL | 1 refills | Status: DC

## 2016-10-09 MED ORDER — CYANOCOBALAMIN 1000 MCG/ML IJ SOLN
1000.0000 ug | Freq: Once | INTRAMUSCULAR | Status: AC
  Administered 2016-10-09: 1000 ug via INTRAMUSCULAR

## 2016-10-09 MED ORDER — ASPIRIN EC 81 MG PO TBEC
81.0000 mg | DELAYED_RELEASE_TABLET | Freq: Every day | ORAL | 0 refills | Status: DC
Start: 2016-10-09 — End: 2021-06-19

## 2016-10-09 MED ORDER — BUDESONIDE-FORMOTEROL FUMARATE 160-4.5 MCG/ACT IN AERO: 2.0000 | INHALATION_SPRAY | Freq: Two times a day (BID) | RESPIRATORY_TRACT | 2 refills | Status: DC

## 2016-10-09 NOTE — Progress Notes (Signed)
Name: Kelsey Alvarez   MRN: [id number]    DOB: August 16, 1970   Date:10/09/2016       Progress Note  Subjective  Chief Complaint  Chief Complaint  Patient presents with  . Follow-up    2 month F/U  . Diabetes    Has lost 9 pounds since last visit Ozempic, just has nausea occasionally    HPI  DM: glucose at home has been around 114 .She denies polyphagia, polydipsia and polyuria. She is now on Ozempic, could not tolerate Trulicity She has proteinuria and is on ARB. She has also noticed tingling on both hands, and both feet intermittently, it had resolved with B12 supplementation but re-starting since skipped some injections.   Morbid Obesity: she lost 9 lbs since last visit.  She switched to diet drinks, still not exercising. She is now on Ozempic since 07/2016, and has lost 9 lbs, she states still has nausea first few days after she takes the shot.   Asthma: she is doing well, taking Symbicort daily, very seldom using rescue inhaler ( last flare secondary to strong scent in her work bathroom) . No current wheezing or SOB, she has a mild dry cough when she goes to bed at night  Hyperlipidemia: taking Atorvastatin, no side effects of medication, no chest pain or palpitation   Patient Active Problem List   Diagnosis Date Noted  . B12 deficiency 04/27/2016  . History of endometriosis 11/17/2015  . Menorrhagia with regular cycle 10/12/2015  . Benign essential HTN 12/28/2014  . Cervical polyp 12/28/2014  . Controlled type 2 diabetes mellitus with microalbuminuria ([place]) 12/28/2014  . Dyslipidemia 12/28/2014  . Dysmenorrhea 12/28/2014  . Edema 12/28/2014  . Female infertility 12/28/2014  . Gastro-esophageal reflux disease without esophagitis 12/28/2014  . Morbid obesity ([place]) 12/28/2014  . Asthma, moderate persistent, poorly-controlled 12/28/2014  . Perennial allergic rhinitis 12/28/2014  . Intermittent tremor 12/28/2014  . Vitamin D deficiency 12/28/2014  . LBP (low back pain)  12/28/2014  . Left sciatic nerve pain 12/28/2014    Past Surgical History:  Procedure Laterality Date  . APPENDECTOMY    . cervical polyp removal    . DILATION AND CURETTAGE OF UTERUS    . HYSTEROSCOPY    . HYSTEROSCOPY W/D&C N/A 10/12/2015   Procedure: DILATATION AND CURETTAGE /HYSTEROSCOPY;  Surgeon: Will Bonnet, MD;  Location: [facility] ORS;  Service: Gynecology;  Laterality: N/A;  . LAPAROSCOPIC BILATERAL SALPINGO OOPHERECTOMY N/A 10/12/2015   Procedure: LAPAROSCOPIC RIGHT SALPINGECTOMY, LEFT OOPHORECTOMY;  Surgeon: Will Bonnet, MD;  Location: [facility] ORS;  Service: Gynecology;  Laterality: N/A;  . LAPAROSCOPIC OVARIAN CYSTECTOMY Bilateral 10/12/2015   Procedure: LAPAROSCOPIC OVARIAN CYSTECTOMY;  Surgeon: Will Bonnet, MD;  Location: [facility] ORS;  Service: Gynecology;  Laterality: Bilateral;  . OOPHORECTOMY    . OVARIAN CYST REMOVAL      Family History  Problem Relation Age of Onset  . Hypertension Mother   . Diabetes Mother   . CAD Mother   . Cancer Mother        cervical  . Heart attack Mother 10       Triple Bypass  . CAD Father   . Hypertension Father   . Arthritis Father   . Thyroid disease Sister   . Alzheimer's disease Maternal Grandmother   . Alzheimer's disease Maternal Grandfather   . Breast cancer Paternal Grandmother     Social History   Social History  . Marital status: Married    Spouse name: N/A  .  Number of children: N/A  . Years of education: N/A   Occupational History  . Museum/gallery curator    Social History Main Topics  . Smoking status: Former Smoker    Years: 1.00    Types: Cigarettes    Start date: 12/03/2001    Quit date: 12/28/2002  . Smokeless tobacco: Never Used     Comment: smoked 3 to 4 cigarrettes a day for a year  . Alcohol use 0.0 oz/week     Comment: occasionally drinks margarita, once or twice a year  . Drug use: No  . Sexual activity: Yes    Partners: Male     Comment: Partial Hysterectomy   Other Topics Concern  . Not on  file   Social History Narrative  . No narrative on file     Current Outpatient Prescriptions:  .  atorvastatin (LIPITOR) 40 MG tablet, Take 1 tablet (40 mg total) by mouth daily., Disp: 90 tablet, Rfl: 1 .  budesonide-formoterol (SYMBICORT) 160-4.5 MCG/ACT inhaler, Inhale 2 puffs into the lungs 2 (two) times daily., Disp: 1 Inhaler, Rfl: 2 .  Cholecalciferol (VITAMIN D) 2000 UNITS tablet, Take 1 tablet by mouth daily., Disp: , Rfl:  .  fluticasone (FLONASE) 50 MCG/ACT nasal spray, Place 2 sprays into both nostrils daily., Disp: 16 g, Rfl: 5 .  levocetirizine (XYZAL) 5 MG tablet, Take 5 mg by mouth every evening., Disp: , Rfl:  .  montelukast (SINGULAIR) 10 MG tablet, Take 1 tablet (10 mg total) by mouth at bedtime., Disp: 90 tablet, Rfl: 1 .  olmesartan-hydrochlorothiazide (BENICAR HCT) 20-12.5 MG tablet, Take 1 tablet by mouth daily., Disp: 30 tablet, Rfl: 2 .  PROAIR HFA 108 (90 Base) MCG/ACT inhaler, INHALE TWO PUFFS BY MOUTH EVERY 6 HOURS AS NEEDED FOR WHEEZING OR  SHORTNESS  OF  BREATH, Disp: 9 each, Rfl: 0 .  Semaglutide (OZEMPIC) 0.25 or 0.5 MG/DOSE SOPN, Inject 0.25-0.5 mg into the skin once a week., Disp: 2 pen, Rfl: 2 .  aspirin EC 81 MG tablet, Take 1 tablet (81 mg total) by mouth daily., Disp: 30 tablet, Rfl: 0  Allergies  Allergen Reactions  . Fish Allergy Shortness Of Breath  . Penicillins Anaphylaxis and Rash    Childhood allergy  . Adhesive [Tape] Rash    Paper tape ok to use.     ROS  Constitutional: Negative for fever, positive for  weight change.  Respiratory: Negative for cough and shortness of breath.   Cardiovascular: Negative for chest pain or palpitations.  Gastrointestinal: Negative for abdominal pain, no bowel changes.  Musculoskeletal: Negative for gait problem or joint swelling.  Skin: Negative for rash.  Neurological: Positive for intermittent dizziness ( she has a history of vertigo)  no  headache.  No other specific complaints in a complete review  of systems (except as listed in HPI above).  Objective  Vitals:   10/09/16 1054  BP: 108/62  Pulse: (!) 58  Resp: 16  Temp: 98 F (36.7 C)  TempSrc: Oral  SpO2: 96%  Weight: 259 lb 11.2 oz (117.8 kg)  Height: 5\' 2"  (1.575 m)    Body mass index is 47.5 kg/m.  Physical Exam  Constitutional: Patient appears well-developed and well-nourished. Obese  No distress.  HEENT: head atraumatic, normocephalic, pupils equal and reactive to light,neck supple, throat within normal limits Cardiovascular: Normal rate, regular rhythm and normal heart sounds.  No murmur heard. No BLE edema. Pulmonary/Chest: Effort normal and breath sounds normal. No respiratory distress. Abdominal: Soft.  There is no tenderness. Psychiatric: Patient has a normal mood and affect. behavior is normal. Judgment and thought content normal.  Recent Results (from the past 2160 hour(s))  HM DIABETES EYE EXAM     Status: None   Collection Time: 07/24/16 12:00 AM  Result Value Ref Range   HM Diabetic Eye Exam No Retinopathy No Retinopathy    Comment: Dr. Annamaria Helling, [place]      PHQ2/9: Depression screen [facility] 2/9 10/09/2016 06/26/2016 04/27/2016 03/27/2016 11/17/2015  Decreased Interest 0 0 0 0 0  Down, Depressed, Hopeless 0 0 0 0 0  PHQ - 2 Score 0 0 0 0 0     Fall Risk: Fall Risk  10/09/2016 06/26/2016 04/27/2016 03/27/2016 11/17/2015  Falls in the past year? No No No No No     Functional Status Survey: Is the patient deaf or have difficulty hearing?: No Does the patient have difficulty seeing, even when wearing glasses/contacts?: No Does the patient have difficulty concentrating, remembering, or making decisions?: No Does the patient have difficulty walking or climbing stairs?: No Does the patient have difficulty dressing or bathing?: No Does the patient have difficulty doing errands alone such as visiting a doctor's office or shopping?: No   Assessment & Plan  1. Controlled type 2 diabetes  mellitus with microalbuminuria, without long-term current use of insulin (HCC)  - Hemoglobin A1c  2. Dyslipidemia associated with type 2 diabetes mellitus (HCC)  - Lipid panel - atorvastatin (LIPITOR) 40 MG tablet; Take 1 tablet (40 mg total) by mouth daily.  Dispense: 90 tablet; Refill: 1  3. Morbid obesity due to excess calories [facility])  She is doing well on Ozempic, lost 9 lbs since started back in May 2018, curbing her appetite, and also taking for DM  4. Benign essential HTN  - COMPLETE METABOLIC PANEL WITH GFR  Decrease bp medication to half pill daily and monitor, bp is towards low end of normal   5. Asthma, moderate persistent, well-controlled  - montelukast (SINGULAIR) 10 MG tablet; Take 1 tablet (10 mg total) by mouth at bedtime.  Dispense: 90 tablet; Refill: 1 - budesonide-formoterol (SYMBICORT) 160-4.5 MCG/ACT inhaler; Inhale 2 puffs into the lungs 2 (two) times daily.  Dispense: 1 Inhaler; Refill: 2  6. B12 deficiency  - cyanocobalamin ((VITAMIN B-12)) injection 1,000 mcg; Inject 1 mL (1,000 mcg total) into the muscle once.

## 2016-10-09 NOTE — Patient Instructions (Signed)
Sub-lingual 1000 units daily

## 2016-10-10 LAB — HEMOGLOBIN A1C
HEMOGLOBIN A1C: 5.9 % — AB (ref <5.7)
MEAN PLASMA GLUCOSE: 123 mg/dL

## 2016-11-21 ENCOUNTER — Encounter: Payer: Self-pay | Admitting: Emergency Medicine

## 2016-11-21 ENCOUNTER — Emergency Department
Admission: EM | Admit: 2016-11-21 | Discharge: 2016-11-22 | Disposition: A | Discharge status: Home / Self Care | Payer: BLUE CROSS/BLUE SHIELD | Attending: Emergency Medicine | Admitting: Emergency Medicine

## 2016-11-21 ENCOUNTER — Emergency Department: Payer: BLUE CROSS/BLUE SHIELD

## 2016-11-21 DIAGNOSIS — I10 Essential (primary) hypertension: Secondary | ICD-10-CM | POA: Insufficient documentation

## 2016-11-21 DIAGNOSIS — G8929 Other chronic pain: Secondary | ICD-10-CM | POA: Diagnosis not present

## 2016-11-21 DIAGNOSIS — Z7982 Long term (current) use of aspirin: Secondary | ICD-10-CM | POA: Diagnosis not present

## 2016-11-21 DIAGNOSIS — R0602 Shortness of breath: Secondary | ICD-10-CM | POA: Diagnosis present

## 2016-11-21 DIAGNOSIS — J45901 Unspecified asthma with (acute) exacerbation: Secondary | ICD-10-CM

## 2016-11-21 DIAGNOSIS — R1011 Right upper quadrant pain: Secondary | ICD-10-CM

## 2016-11-21 DIAGNOSIS — E119 Type 2 diabetes mellitus without complications: Secondary | ICD-10-CM | POA: Insufficient documentation

## 2016-11-21 DIAGNOSIS — Z87891 Personal history of nicotine dependence: Secondary | ICD-10-CM | POA: Diagnosis not present

## 2016-11-21 DIAGNOSIS — J4541 Moderate persistent asthma with (acute) exacerbation: Secondary | ICD-10-CM | POA: Insufficient documentation

## 2016-11-21 DIAGNOSIS — Z79899 Other long term (current) drug therapy: Secondary | ICD-10-CM | POA: Diagnosis not present

## 2016-11-21 LAB — BASIC METABOLIC PANEL
Anion gap: 10 (ref 5–15)
BUN: 12 mg/dL (ref 6–20)
CALCIUM: 9.2 mg/dL (ref 8.9–10.3)
CHLORIDE: 101 mmol/L (ref 101–111)
CO2: 26 mmol/L (ref 22–32)
CREATININE: 0.72 mg/dL (ref 0.44–1.00)
Glucose, Bld: 105 mg/dL — ABNORMAL HIGH (ref 65–99)
Potassium: 3.3 mmol/L — ABNORMAL LOW (ref 3.5–5.1)
SODIUM: 137 mmol/L (ref 135–145)

## 2016-11-21 LAB — CBC
HCT: 41 % (ref 35.0–47.0)
Hemoglobin: 13.9 g/dL (ref 12.0–16.0)
MCH: 26.2 pg (ref 26.0–34.0)
MCHC: 34 g/dL (ref 32.0–36.0)
MCV: 77.2 fL — AB (ref 80.0–100.0)
PLATELETS: 297 10*3/uL (ref 150–440)
RBC: 5.31 MIL/uL — AB (ref 3.80–5.20)
RDW: 14.9 % — ABNORMAL HIGH (ref 11.5–14.5)
WBC: 9.7 10*3/uL (ref 3.6–11.0)

## 2016-11-21 LAB — TROPONIN I

## 2016-11-21 MED ORDER — IPRATROPIUM-ALBUTEROL 0.5-2.5 (3) MG/3ML IN SOLN
RESPIRATORY_TRACT | Status: AC
  Filled 2016-11-21: qty 3

## 2016-11-21 MED ORDER — IPRATROPIUM-ALBUTEROL 0.5-2.5 (3) MG/3ML IN SOLN
3.0000 mL | Freq: Once | RESPIRATORY_TRACT | Status: AC
  Administered 2016-11-21: 3 mL via RESPIRATORY_TRACT

## 2016-11-21 NOTE — ED Triage Notes (Signed)
Patient with complaint of shortness of breath that started this morning. Patient has a history of asthma and used her rescue inhaler with no improvement. Patient with complaint of right side chest pain when taking deep breaths.

## 2016-11-22 ENCOUNTER — Emergency Department: Payer: BLUE CROSS/BLUE SHIELD

## 2016-11-22 MED ORDER — AZITHROMYCIN 500 MG PO TABS
500.0000 mg | ORAL_TABLET | Freq: Once | ORAL | Status: AC
  Administered 2016-11-22: 500 mg via ORAL
  Filled 2016-11-22: qty 1

## 2016-11-22 MED ORDER — PREDNISONE 20 MG PO TABS
60.0000 mg | ORAL_TABLET | Freq: Once | ORAL | Status: AC
  Administered 2016-11-22: 60 mg via ORAL
  Filled 2016-11-22: qty 3

## 2016-11-22 MED ORDER — AZITHROMYCIN 500 MG PO TABS: 500.0000 mg | ORAL_TABLET | Freq: Every day | ORAL | 0 refills | Status: AC

## 2016-11-22 MED ORDER — IPRATROPIUM-ALBUTEROL 0.5-2.5 (3) MG/3ML IN SOLN
3.0000 mL | Freq: Once | RESPIRATORY_TRACT | Status: AC
  Administered 2016-11-22: 3 mL via RESPIRATORY_TRACT
  Filled 2016-11-22: qty 3

## 2016-11-22 MED ORDER — ALBUTEROL SULFATE HFA 108 (90 BASE) MCG/ACT IN AERS: 2.0000 | INHALATION_SPRAY | Freq: Four times a day (QID) | RESPIRATORY_TRACT | 0 refills | Status: DC | PRN

## 2016-11-22 MED ORDER — PREDNISONE 20 MG PO TABS: 60.0000 mg | ORAL_TABLET | Freq: Every day | ORAL | 0 refills | Status: AC

## 2016-11-22 NOTE — ED Notes (Signed)
Pt states that she has asthma. She has pain with breathing on right side. She took albuterol inhaler and that didn't work. States she was around someone who had walking pneumonia recently so she's not sure if it's that or her asthma. Family at bedside.

## 2016-11-22 NOTE — ED Provider Notes (Signed)
[facility] Emergency Department Provider Note   First MD Initiated Contact with Patient 11/22/16 0017     (approximate)  I have reviewed the triage vital signs and the nursing notes.   HISTORY  Chief Complaint Shortness of Breath   HPI Kelsey Alvarez is a 46 y.o. female with below list of chronic medical conditions presents to the emergency department with dyspnea, wheezing nonproductive cough with onset yesterday morning. Patient also admits to right upper quadrant abdominal pain as well worse with deep inspiration. Patient denies any fever or febrile on presentation.   Past Medical History:  Diagnosis Date  . Abnormal CBC   . Allergy   . Arthritis    left knee  . Asthma   . Cervical polyp   . Chronic foot pain   . Complication of anesthesia    takes longer to wake up.  . Contact dermatitis   . Diabetes mellitus without complication ([place])   . Dysmenorrhea   . Edema   . 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 GERD (gastroesophageal reflux disease)   . Headache    history of migraines  . Hyperlipidemia   . Hypertension   . Lumbago   . Obesity   . Occasional tremors     Patient Active Problem List   Diagnosis Date Noted  . B12 deficiency 04/27/2016  . History of endometriosis 11/17/2015  . Menorrhagia with regular cycle 10/12/2015  . Benign essential HTN 12/28/2014  . Cervical polyp 12/28/2014  . Controlled type 2 diabetes mellitus with microalbuminuria ([place]) 12/28/2014  . Dyslipidemia 12/28/2014  . Dysmenorrhea 12/28/2014  . Edema 12/28/2014  . Female infertility 12/28/2014  . Gastro-esophageal reflux disease without esophagitis 12/28/2014  . Morbid obesity ([place]) 12/28/2014  . Asthma, moderate persistent, poorly-controlled 12/28/2014  . Perennial allergic rhinitis 12/28/2014  . Intermittent tremor 12/28/2014  . Vitamin D deficiency 12/28/2014  . LBP (low back pain) 12/28/2014  . Left sciatic nerve pain 12/28/2014    Past Surgical  History:  Procedure Laterality Date  . APPENDECTOMY    . cervical polyp removal    . DILATION AND CURETTAGE OF UTERUS    . HYSTEROSCOPY    . HYSTEROSCOPY W/D&C N/A 10/12/2015   Procedure: DILATATION AND CURETTAGE /HYSTEROSCOPY;  Surgeon: Will Bonnet, MD;  Location: [facility] ORS;  Service: Gynecology;  Laterality: N/A;  . LAPAROSCOPIC BILATERAL SALPINGO OOPHERECTOMY N/A 10/12/2015   Procedure: LAPAROSCOPIC RIGHT SALPINGECTOMY, LEFT OOPHORECTOMY;  Surgeon: Will Bonnet, MD;  Location: [facility] ORS;  Service: Gynecology;  Laterality: N/A;  . LAPAROSCOPIC OVARIAN CYSTECTOMY Bilateral 10/12/2015   Procedure: LAPAROSCOPIC OVARIAN CYSTECTOMY;  Surgeon: Will Bonnet, MD;  Location: [facility] ORS;  Service: Gynecology;  Laterality: Bilateral;  . OOPHORECTOMY    . OVARIAN CYST REMOVAL      Prior to Admission medications   Medication Sig Start Date End Date Taking? Authorizing Provider  aspirin EC 81 MG tablet Take 1 tablet (81 mg total) by mouth daily. 10/09/16   Steele Sizer, MD  atorvastatin (LIPITOR) 40 MG tablet Take 1 tablet (40 mg total) by mouth daily. 10/09/16   Steele Sizer, MD  budesonide-formoterol (SYMBICORT) 160-4.5 MCG/ACT inhaler Inhale 2 puffs into the lungs 2 (two) times daily. 10/09/16   Steele Sizer, MD  Cholecalciferol (VITAMIN D) 2000 UNITS tablet Take 1 tablet by mouth daily. 08/23/10   [provider]  fluticasone (FLONASE) 50 MCG/ACT nasal spray Place 2 sprays into both nostrils daily. 06/26/16   Steele Sizer, MD  levocetirizine Harlow Ohms)  5 MG tablet Take 5 mg by mouth every evening.    [provider]  montelukast (SINGULAIR) 10 MG tablet Take 1 tablet (10 mg total) by mouth at bedtime. 10/09/16   Steele Sizer, MD  olmesartan-hydrochlorothiazide (BENICAR HCT) 20-12.5 MG tablet Take 1 tablet by mouth daily. 10/03/16   Steele Sizer, MD  PROAIR HFA 108 [phone] Base) MCG/ACT inhaler INHALE TWO PUFFS BY MOUTH EVERY 6 HOURS AS NEEDED FOR WHEEZING OR  SHORTNESS  OF   BREATH 06/07/16   Sowles, Drue Stager, MD  Semaglutide (OZEMPIC) 0.25 or 0.5 MG/DOSE SOPN Inject 0.25-0.5 mg into the skin once a week. 07/19/16   Steele Sizer, MD    Allergies Fish allergy; Penicillins; and Adhesive [tape]  Family History  Problem Relation Age of Onset  . Hypertension Mother   . Diabetes Mother   . CAD Mother   . Cancer Mother        cervical  . Heart attack Mother 49       Triple Bypass  . CAD Father   . Hypertension Father   . Arthritis Father   . Thyroid disease Sister   . Alzheimer's disease Maternal Grandmother   . Alzheimer's disease Maternal Grandfather   . Breast cancer Paternal Grandmother     Social History Social History  Substance Use Topics  . Smoking status: Former Smoker    Years: 1.00    Types: Cigarettes    Start date: 12/03/2001    Quit date: 12/28/2002  . Smokeless tobacco: Never Used     Comment: smoked 3 to 4 cigarrettes a day for a year  . Alcohol use 0.0 oz/week     Comment: occasionally drinks margarita, once or twice a year    Review of Systems Constitutional: No fever/chills Eyes: No visual changes. ENT: No sore throat. Cardiovascular: Denies chest pain. Respiratory: positive for cough dyspnea and wheezing Gastrointestinal: No abdominal pain.  No nausea, no vomiting.  No diarrhea.  No constipation. Genitourinary: Negative for dysuria. Musculoskeletal: Negative for neck pain.  Negative for back pain. Integumentary: Negative for rash. Neurological: Negative for headaches, focal weakness or numbness.   ____________________________________________   PHYSICAL EXAM:  VITAL SIGNS: ED Triage Vitals  Enc Vitals Group     BP 11/21/16 2104 117/66     Pulse Rate 11/21/16 2100 74     Resp 11/21/16 2100 (!) 24     Temp 11/21/16 2100 97.6 F (36.4 C)     Temp Source 11/21/16 2100 Oral     SpO2 11/21/16 2100 100 %     Weight 11/21/16 2101 113.4 kg (250 lb)     Height 11/21/16 2101 1.575 m (5\' 2" )     Head Circumference --       Peak Flow --      Pain Score 11/21/16 2100 7     Pain Loc --      Pain Edu? --      Excl. in [place]? --     Constitutional: Alert and oriented. Well appearing and in no acute distress. Eyes: Conjunctivae are normal.  Head: Atraumatic. Mouth/Throat: Mucous membranes are moist.  Oropharynx non-erythematous. Neck: No stridor.   Cardiovascular: Normal rate, regular rhythm. Good peripheral circulation. Grossly normal heart sounds. Respiratory: Normal respiratory effort.  No retractions. mild history wheezes Gastrointestinal: upper quadrant tenderness to palpation. No distention.  Musculoskeletal: No lower extremity tenderness nor edema. No gross deformities of extremities. Neurologic:  Normal speech and language. No gross focal neurologic deficits are appreciated.  Skin:  Skin is warm, dry and intact. No rash noted. Psychiatric: Mood and affect are normal. Speech and behavior are normal.  ____________________________________________   LABS (all labs ordered are listed, but only abnormal results are displayed)  Labs Reviewed  BASIC METABOLIC PANEL - Abnormal; Notable for the following:       Result Value   Potassium 3.3 (*)    Glucose, Bld 105 (*)    All other components within normal limits  CBC - Abnormal; Notable for the following:    RBC 5.31 (*)    MCV 77.2 (*)    RDW 14.9 (*)    All other components within normal limits  TROPONIN I   ____________________________________________  EKG  ED ECG REPORT I, [place] N BROWN, the attending physician, personally viewed and interpreted this ECG.   Date: 11/22/2016  EKG Time: 9:03 PM  Rate: 66  Rhythm:normal sinus rhythm  Axis: normal  Intervals:normal  ST&T Change: none  ____________________________________________  RADIOLOGY I, [place] N BROWN, personally viewed and evaluated these images (plain radiographs) as part of my medical decision making, as well as reviewing the written report by the radiologist.  Dg Chest 2  View  Result Date: 11/21/2016 CLINICAL DATA:  Pt states she has been sob with right sided chest pain, chills, dry cough x 1 week. Hx asthma, pneumonia and bronchitis. exsmoker EXAM: CHEST  2 VIEW COMPARISON:  Chest x-ray dated 10/13/2010. FINDINGS: Cardiomediastinal silhouette is normal in size and configuration. Lungs are clear. Lung volumes are normal. No evidence of pneumonia. No pleural effusion. No pneumothorax seen. Osseous structures about the chest are unremarkable. IMPRESSION: No active cardiopulmonary disease. No evidence of pneumonia or pulmonary edema. Electronically Signed   By: Franki Cabot M.D.   On: 11/21/2016 22:40      Procedures   ____________________________________________   INITIAL IMPRESSION / ASSESSMENT AND PLAN / ED COURSE  Pertinent labs & imaging results that were available during my care of the patient were reviewed by me and considered in my medical decision making (see chart for details).  patient received 2 DuoNeb's and prednisone as well as azithromycin with improvement of symptoms. Patient will be prescribed albuterol prednisone and azithromycin for home for acute bronchitis. Concern for possible pneumonia as such chest x-ray was performed which revealed no evidence of pneumonia. Given patient's right upper quadrant pain concerning for possible muscular pain secondary to coughing versus gallbladder disease and a such ultrasound was performed which was also negative      ____________________________________________  FINAL CLINICAL IMPRESSION(S) / ED DIAGNOSES  Final diagnoses:  RUQ pain  Moderate asthma with exacerbation, unspecified whether persistent     MEDICATIONS GIVEN DURING THIS VISIT:  Medications  ipratropium-albuterol (DUONEB) 0.5-2.5 (3) MG/3ML nebulizer solution 3 mL (not administered)  predniSONE (DELTASONE) tablet 60 mg (not administered)  azithromycin (ZITHROMAX) tablet 500 mg (not administered)  ipratropium-albuterol (DUONEB)  0.5-2.5 (3) MG/3ML nebulizer solution 3 mL (3 mLs Nebulization Given 11/21/16 2112)     NEW OUTPATIENT MEDICATIONS STARTED DURING THIS VISIT:  New Prescriptions   No medications on file    Modified Medications   No medications on file    Discontinued Medications   No medications on file     Note:  This document was prepared using Dragon voice recognition software and may include unintentional dictation errors.    Gregor Hams, MD 11/22/16 [phone]

## 2016-12-07 ENCOUNTER — Other Ambulatory Visit: Payer: Self-pay | Admitting: Family Medicine

## 2016-12-07 DIAGNOSIS — E785 Hyperlipidemia, unspecified: Secondary | ICD-10-CM

## 2016-12-07 DIAGNOSIS — R809 Proteinuria, unspecified: Principal | ICD-10-CM

## 2016-12-07 DIAGNOSIS — E1129 Type 2 diabetes mellitus with other diabetic kidney complication: Secondary | ICD-10-CM

## 2016-12-07 DIAGNOSIS — E1169 Type 2 diabetes mellitus with other specified complication: Secondary | ICD-10-CM

## 2017-01-02 ENCOUNTER — Other Ambulatory Visit: Payer: Self-pay | Admitting: Family Medicine

## 2017-01-02 NOTE — Telephone Encounter (Signed)
Refill request for general medication: Benicar HCT  Last office visit: 10/09/2016  Last physical exam: 04/27/2016  Follow up visit: 01/09/2017

## 2017-01-09 ENCOUNTER — Encounter: Payer: Self-pay | Admitting: Family Medicine

## 2017-01-09 ENCOUNTER — Ambulatory Visit (INDEPENDENT_AMBULATORY_CARE_PROVIDER_SITE_OTHER): Payer: BLUE CROSS/BLUE SHIELD | Admitting: Family Medicine

## 2017-01-09 VITALS — BP 120/82 | HR 79 | Temp 98.4°F | Resp 14 | Ht 62.0 in | Wt 262.8 lb

## 2017-01-09 DIAGNOSIS — I10 Essential (primary) hypertension: Secondary | ICD-10-CM | POA: Diagnosis not present

## 2017-01-09 DIAGNOSIS — R809 Proteinuria, unspecified: Secondary | ICD-10-CM | POA: Diagnosis not present

## 2017-01-09 DIAGNOSIS — E538 Deficiency of other specified B group vitamins: Secondary | ICD-10-CM | POA: Diagnosis not present

## 2017-01-09 DIAGNOSIS — E1169 Type 2 diabetes mellitus with other specified complication: Secondary | ICD-10-CM | POA: Diagnosis not present

## 2017-01-09 DIAGNOSIS — J4540 Moderate persistent asthma, uncomplicated: Secondary | ICD-10-CM | POA: Diagnosis not present

## 2017-01-09 DIAGNOSIS — E1129 Type 2 diabetes mellitus with other diabetic kidney complication: Secondary | ICD-10-CM | POA: Diagnosis not present

## 2017-01-09 DIAGNOSIS — Z23 Encounter for immunization: Secondary | ICD-10-CM | POA: Diagnosis not present

## 2017-01-09 DIAGNOSIS — K760 Fatty (change of) liver, not elsewhere classified: Secondary | ICD-10-CM

## 2017-01-09 DIAGNOSIS — E785 Hyperlipidemia, unspecified: Secondary | ICD-10-CM

## 2017-01-09 LAB — POCT GLYCOSYLATED HEMOGLOBIN (HGB A1C): HEMOGLOBIN A1C: 6

## 2017-01-09 MED ORDER — BUDESONIDE-FORMOTEROL FUMARATE 160-4.5 MCG/ACT IN AERO: 2.0000 | INHALATION_SPRAY | Freq: Two times a day (BID) | RESPIRATORY_TRACT | 2 refills | Status: DC

## 2017-01-09 MED ORDER — OLMESARTAN MEDOXOMIL-HCTZ 20-12.5 MG PO TABS: 1.0000 | ORAL_TABLET | Freq: Every day | ORAL | 0 refills | Status: DC

## 2017-01-09 MED ORDER — IPRATROPIUM-ALBUTEROL 0.5-2.5 (3) MG/3ML IN SOLN: 3.0000 mL | RESPIRATORY_TRACT | 0 refills | Status: DC | PRN

## 2017-01-09 MED ORDER — CYANOCOBALAMIN 1000 MCG/ML IJ SOLN
1000.0000 ug | Freq: Once | INTRAMUSCULAR | Status: AC
  Administered 2017-01-09: 1000 ug via INTRAMUSCULAR

## 2017-01-09 MED ORDER — SEMAGLUTIDE(0.25 OR 0.5MG/DOS) 2 MG/1.5ML ~~LOC~~ SOPN: 0.5000 mg | PEN_INJECTOR | SUBCUTANEOUS | 2 refills | Status: DC

## 2017-01-09 NOTE — Progress Notes (Signed)
Name: Kelsey Alvarez   MRN: [id number]    DOB: [birth year]   Date:01/09/2017       Progress Note  Subjective  Chief Complaint  Chief Complaint  Patient presents with  . Diabetes  . Hypertension  . Hyperlipidemia  . Asthma    HPI  DM: glucose at home has been elevated since out of Ozempic, 16 fasting recently. .She denies polyphagia, polydipsia and polyuria. She has been out of Ozempic for the past month because of cost, could not tolerate Trulicity She has proteinuria and is on ARB. She is still having tingling on her hands, she is taking B12 otc, we will give her B12 injection today, discussed carpal tunnel syndrome.   Morbid Obesity: she had lost 9 lbs previous 3 months, but has been out of Ozempic for one month and has gained 3 lbs back.  She switched to diet drinks, still not exercising. She recently went to [facility] with ruq pain and asthma flare and US liver showed fatty liver, discussed importance of losing weight.   Asthma: she has a severe flare 11/2016, went to [facility] and was given z-pack, prednisone, cough medication. She is better, but still has a dry cough, mild wheezing and some SOB with activity. We tried switching symbicort to breo but not covered by insurance. Continue singulair, symbicort and try duoneb prn at home.   Hyperlipidemia: taking Atorvastatin, no side effects of medication, no chest pain or palpitation  HTN: continue medication, no chest pain or palpitation    Patient Active Problem List   Diagnosis Date Noted  . B12 deficiency 04/27/2016  . History of endometriosis 11/17/2015  . Menorrhagia with regular cycle 10/12/2015  . Benign essential HTN 12/28/2014  . Cervical polyp 12/28/2014  . Controlled type 2 diabetes mellitus with microalbuminuria ([place]) 12/28/2014  . Dyslipidemia 12/28/2014  . Dysmenorrhea 12/28/2014  . Edema 12/28/2014  . Female infertility 12/28/2014  . Gastro-esophageal reflux disease without esophagitis 12/28/2014  . Morbid obesity ([place])  12/28/2014  . Asthma, moderate persistent, poorly-controlled 12/28/2014  . Perennial allergic rhinitis 12/28/2014  . Intermittent tremor 12/28/2014  . Vitamin D deficiency 12/28/2014  . LBP (low back pain) 12/28/2014  . Left sciatic nerve pain 12/28/2014    Past Surgical History:  Procedure Laterality Date  . APPENDECTOMY    . cervical polyp removal    . DILATION AND CURETTAGE OF UTERUS    . HYSTEROSCOPY    . OOPHORECTOMY    . OVARIAN CYST REMOVAL      Family History  Problem Relation Age of Onset  . Hypertension Mother   . Diabetes Mother   . CAD Mother   . Cancer Mother        cervical  . Heart attack Mother 58       Triple Bypass  . CAD Father   . Hypertension Father   . Arthritis Father   . Thyroid disease Sister   . Alzheimer's disease Maternal Grandmother   . Alzheimer's disease Maternal Grandfather   . Breast cancer Paternal Grandmother     Social History   Socioeconomic History  . Marital status: Married    Spouse name: Not on file  . Number of children: Not on file  . Years of education: Not on file  . Highest education level: Not on file  Social Needs  . Financial resource strain: Not on file  . Food insecurity - worry: Not on file  . Food insecurity - inability: Not on file  . Transportation needs -  medical: Not on file  . Transportation needs - non-medical: Not on file  Occupational History  . Occupation: sales clerk  Tobacco Use  . Smoking status: Former Smoker    Years: 1.00    Types: Cigarettes    Start date: 12/03/2001    Last attempt to quit: 12/28/2002    Years since quitting: 14.0  . Smokeless tobacco: Never Used  . Tobacco comment: smoked 3 to 4 cigarrettes a day for a year  Substance and Sexual Activity  . Alcohol use: Yes    Alcohol/week: 0.0 oz    Comment: occasionally drinks margarita, once or twice a year  . Drug use: No  . Sexual activity: Not on file    Comment: Partial Hysterectomy  Other Topics Concern  . Not on file   Social History Narrative  . Not on file     Current Outpatient Medications:  .  aspirin EC 81 MG tablet, Take 1 tablet (81 mg total) by mouth daily., Disp: 30 tablet, Rfl: 0 .  atorvastatin (LIPITOR) 40 MG tablet, Take 1 tablet (40 mg total) by mouth daily., Disp: 90 tablet, Rfl: 1 .  budesonide-formoterol (SYMBICORT) 160-4.5 MCG/ACT inhaler, Inhale 2 puffs 2 (two) times daily into the lungs., Disp: 1 Inhaler, Rfl: 2 .  Cholecalciferol (VITAMIN D) 2000 UNITS tablet, Take 1 tablet by mouth daily., Disp: , Rfl:  .  fluticasone (FLONASE) 50 MCG/ACT nasal spray, Place 2 sprays into both nostrils daily., Disp: 16 g, Rfl: 5 .  levocetirizine (XYZAL) 5 MG tablet, Take 5 mg by mouth every evening., Disp: , Rfl:  .  montelukast (SINGULAIR) 10 MG tablet, Take 1 tablet (10 mg total) by mouth at bedtime., Disp: 90 tablet, Rfl: 1 .  olmesartan-hydrochlorothiazide (BENICAR HCT) 20-12.5 MG tablet, Take 1 tablet daily by mouth., Disp: 30 tablet, Rfl: 0 .  PROAIR HFA 108 (90 Base) MCG/ACT inhaler, INHALE TWO PUFFS BY MOUTH EVERY 6 HOURS AS NEEDED FOR WHEEZING OR  SHORTNESS  OF  BREATH, Disp: 9 each, Rfl: 0 .  ipratropium-albuterol (DUONEB) 0.5-2.5 (3) MG/3ML SOLN, Take 3 mLs every 4 (four) hours as needed by nebulization., Disp: 360 mL, Rfl: 0 .  Semaglutide (OZEMPIC) 0.25 or 0.5 MG/DOSE SOPN, Inject 0.5 mg once a week into the skin., Disp: 2 pen, Rfl: 2  Allergies  Allergen Reactions  . Fish Allergy Shortness Of Breath  . Penicillins Anaphylaxis and Rash    Childhood allergy  . Adhesive [Tape] Rash    Paper tape ok to use.     ROS  Constitutional: Negative for fever or weight change.  Respiratory: Positive  for dry cough and intermittent  shortness of breath.   Cardiovascular: Negative for chest pain or palpitations.  Gastrointestinal: Negative for abdominal pain, no bowel changes.  Musculoskeletal: Negative for gait problem or joint swelling.  Skin: Negative for rash.  Neurological: Negative  for dizziness or headache.  No other specific complaints in a complete review of systems (except as listed in HPI above).  Objective  Vitals:   01/09/17 1019  BP: 120/82  Pulse: 79  Resp: 14  Temp: 98.4 F (36.9 C)  TempSrc: Oral  SpO2: 96%  Weight: 262 lb 12.8 oz (119.2 kg)  Height: 5' 2" (1.575 m)    Body mass index is 48.07 kg/m.  Physical Exam  Constitutional: Patient appears well-developed and well-nourished. Obese  No distress.  HEENT: head atraumatic, normocephalic, pupils equal and reactive to light,  neck supple, throat within normal limits Cardiovascular: Normal   rate, regular rhythm and normal heart sounds.  No murmur heard. No BLE edema. Pulmonary/Chest: Effort normal and breath sounds normal. No respiratory distress. Abdominal: Soft.  There is no tenderness. Psychiatric: Patient has a normal mood and affect. behavior is normal. Judgment and thought content normal.  Recent Results (from the past 2160 hour(s))  Basic metabolic panel     Status: Abnormal   Collection Time: 11/21/16  9:03 PM  Result Value Ref Range   Sodium 137 135 - 145 mmol/L   Potassium 3.3 (L) 3.5 - 5.1 mmol/L   Chloride 101 101 - 111 mmol/L   CO2 26 22 - 32 mmol/L   Glucose, Bld 105 (H) 65 - 99 mg/dL   BUN 12 6 - 20 mg/dL   Creatinine, Ser 0.72 0.44 - 1.00 mg/dL   Calcium 9.2 8.9 - 10.3 mg/dL   GFR calc non Af Amer >60 >60 mL/min   GFR calc Af Amer >60 >60 mL/min    Comment: (NOTE) The eGFR has been calculated using the CKD EPI equation. This calculation has not been validated in all clinical situations. eGFR's persistently <60 mL/min signify possible Chronic Kidney Disease.    Anion gap 10 5 - 15  CBC     Status: Abnormal   Collection Time: 11/21/16  9:03 PM  Result Value Ref Range   WBC 9.7 3.6 - 11.0 K/uL   RBC 5.31 (H) 3.80 - 5.20 MIL/uL   Hemoglobin 13.9 12.0 - 16.0 g/dL   HCT 41.0 35.0 - 47.0 %   MCV 77.2 (L) 80.0 - 100.0 fL   MCH 26.2 26.0 - 34.0 pg   MCHC 34.0 32.0 -  36.0 g/dL   RDW 14.9 (H) 11.5 - 14.5 %   Platelets 297 150 - 440 K/uL  Troponin I     Status: None   Collection Time: 11/21/16  9:03 PM  Result Value Ref Range   Troponin I <0.03 <0.03 ng/mL  POCT HgB A1C     Status: Abnormal   Collection Time: 01/09/17 10:57 AM  Result Value Ref Range   Hemoglobin A1C 6.0     Diabetic Foot Exam: Diabetic Foot Exam - Simple   Simple Foot Form Diabetic Foot exam was performed with the following findings:  Yes 01/09/2017 11:15 AM  Visual Inspection No deformities, no ulcerations, no other skin breakdown bilaterally:  Yes Sensation Testing Intact to touch and monofilament testing bilaterally:  Yes Pulse Check Posterior Tibialis and Dorsalis pulse intact bilaterally:  Yes Comments      PHQ2/9: Depression screen [facility] 2/9 10/09/2016 06/26/2016 04/27/2016 03/27/2016 11/17/2015  Decreased Interest 0 0 0 0 0  Down, Depressed, Hopeless 0 0 0 0 0  PHQ - 2 Score 0 0 0 0 0     Fall Risk: Fall Risk  01/09/2017 10/09/2016 06/26/2016 04/27/2016 03/27/2016  Falls in the past year? _0       Assessment & Plan  1. Controlled type 2 diabetes mellitus with microalbuminuria, without long-term current use of insulin ([facility])  - POCT HgB A1C  2. Needs flu shot  - Flu Vaccine QUAD 36+ mos IM  3. Dyslipidemia associated with type 2 diabetes mellitus ([place])   4. Morbid obesity due to excess calories ([place])  She was doing well on Ozempic but having coverage problems, we will see if it can get approved  5. Benign essential HTN  At goal   6. B12 deficiency  Take otc supplements  7. Asthma, moderate persistent, poorly-controlled  We will add duoneb - ipratropium-albuterol (DUONEB) 0.5-2.5 (3) MG/3ML SOLN; Take 3 mLs every 4 (four) hours as needed by nebulization.  Dispense: 360 mL; Refill: 0 - budesonide-formoterol (SYMBICORT) 160-4.5 MCG/ACT inhaler; Inhale 2 puffs 2 (two) times daily into the lungs.  Dispense: 1 Inhaler; Refill: 2  8. Fatty  liver  US done at [facility]   

## 2017-02-21 ENCOUNTER — Other Ambulatory Visit: Payer: Self-pay | Admitting: Family Medicine

## 2017-02-21 DIAGNOSIS — I10 Essential (primary) hypertension: Secondary | ICD-10-CM

## 2017-02-21 NOTE — Telephone Encounter (Signed)
Refill request for Hypertension medication:  Benicar HCT  Last office visit pertaining to hypertension: 01/09/2017  Follow up: 04/11/2017  BP Readings from Last 3 Encounters:  01/09/17 120/82  11/22/16 94/71  10/09/16 108/62     Lab Results  Component Value Date   CREATININE 0.72 11/21/2016   BUN 12 11/21/2016   NA 137 11/21/2016   K 3.3 (L) 11/21/2016   CL 101 11/21/2016   CO2 26 11/21/2016

## 2017-02-28 ENCOUNTER — Ambulatory Visit (INDEPENDENT_AMBULATORY_CARE_PROVIDER_SITE_OTHER): Payer: BLUE CROSS/BLUE SHIELD | Admitting: Family Medicine

## 2017-02-28 ENCOUNTER — Encounter: Payer: Self-pay | Admitting: Family Medicine

## 2017-02-28 VITALS — BP 130/82 | HR 92 | Temp 98.3°F | Resp 14 | Ht 62.0 in | Wt 260.8 lb

## 2017-02-28 DIAGNOSIS — R809 Proteinuria, unspecified: Secondary | ICD-10-CM | POA: Diagnosis not present

## 2017-02-28 DIAGNOSIS — J4541 Moderate persistent asthma with (acute) exacerbation: Secondary | ICD-10-CM | POA: Diagnosis not present

## 2017-02-28 DIAGNOSIS — E1129 Type 2 diabetes mellitus with other diabetic kidney complication: Secondary | ICD-10-CM

## 2017-02-28 DIAGNOSIS — R109 Unspecified abdominal pain: Secondary | ICD-10-CM

## 2017-02-28 LAB — POCT URINALYSIS DIPSTICK
Bilirubin, UA: NEGATIVE
Blood, UA: NEGATIVE
GLUCOSE UA: NEGATIVE
KETONES UA: NEGATIVE
Leukocytes, UA: NEGATIVE
NITRITE UA: NEGATIVE
Protein, UA: NEGATIVE
SPEC GRAV UA: 1.025 (ref 1.010–1.025)
Urobilinogen, UA: NEGATIVE E.U./dL — AB
pH, UA: 5 (ref 5.0–8.0)

## 2017-02-28 MED ORDER — AZITHROMYCIN 250 MG PO TABS: ORAL_TABLET | ORAL | 0 refills | Status: DC

## 2017-02-28 MED ORDER — BENZONATATE 100 MG PO CAPS: 100.0000 mg | ORAL_CAPSULE | Freq: Two times a day (BID) | ORAL | 0 refills | Status: DC | PRN

## 2017-02-28 NOTE — Progress Notes (Signed)
Name: Kelsey Alvarez   MRN: [id number]    DOB: 1971/02/08   Date:02/28/2017       Progress Note  Subjective  Chief Complaint  Chief Complaint  Patient presents with  . Cough  . URI  . Flank Pain    right    HPI  Pt presents with 1.5 weeks of congested cough, sore throat (better now), subjective fevers, nasal congestion, shortness of breath. Denies chest pain, body aches, NVD, or headaches.  RIGHT flank pain - new onset about 1 week ago; pain is constant, worse with deep inspiration and described as sharp. Denies history of kidney stones, dysuria, body aches, NVD.  Endorses some intermittent urinary urgency x3 weeks.   Pertinent Hx: Fatty liver.  Asthma - using Symbicort, singulair daily, proair as needed - helps with shortness of breath during this illness. DM Type 2 - Last A1C was 6.0.  Patient Active Problem List   Diagnosis Date Noted  . B12 deficiency 04/27/2016  . History of endometriosis 11/17/2015  . Menorrhagia with regular cycle 10/12/2015  . Benign essential HTN 12/28/2014  . Cervical polyp 12/28/2014  . Controlled type 2 diabetes mellitus with microalbuminuria ([place]) 12/28/2014  . Dyslipidemia 12/28/2014  . Dysmenorrhea 12/28/2014  . Edema 12/28/2014  . Female infertility 12/28/2014  . Gastro-esophageal reflux disease without esophagitis 12/28/2014  . Morbid obesity ([place]) 12/28/2014  . Asthma, moderate persistent, poorly-controlled 12/28/2014  . Perennial allergic rhinitis 12/28/2014  . Intermittent tremor 12/28/2014  . Vitamin D deficiency 12/28/2014  . LBP (low back pain) 12/28/2014  . Left sciatic nerve pain 12/28/2014    Social History   Tobacco Use  . Smoking status: Former Smoker    Years: 1.00    Types: Cigarettes    Start date: 12/03/2001    Last attempt to quit: 12/28/2002    Years since quitting: 14.1  . Smokeless tobacco: Never Used  . Tobacco comment: smoked 3 to 4 cigarrettes a day for a year  Substance Use Topics  . Alcohol use: Yes     Alcohol/week: 0.0 oz    Comment: occasionally drinks margarita, once or twice a year     Current Outpatient Medications:  .  aspirin EC 81 MG tablet, Take 1 tablet (81 mg total) by mouth daily., Disp: 30 tablet, Rfl: 0 .  atorvastatin (LIPITOR) 40 MG tablet, Take 1 tablet (40 mg total) by mouth daily., Disp: 90 tablet, Rfl: 1 .  azithromycin (ZITHROMAX) 250 MG tablet, Day1: take 2 tabs; Day2-5: take 1 tab daily., Disp: 6 tablet, Rfl: 0 .  benzonatate (TESSALON) 100 MG capsule, Take 1 capsule (100 mg total) by mouth 2 (two) times daily as needed for cough., Disp: 20 capsule, Rfl: 0 .  budesonide-formoterol (SYMBICORT) 160-4.5 MCG/ACT inhaler, Inhale 2 puffs 2 (two) times daily into the lungs., Disp: 1 Inhaler, Rfl: 2 .  Cholecalciferol (VITAMIN D) 2000 UNITS tablet, Take 1 tablet by mouth daily., Disp: , Rfl:  .  fluticasone (FLONASE) 50 MCG/ACT nasal spray, Place 2 sprays into both nostrils daily., Disp: 16 g, Rfl: 5 .  ipratropium-albuterol (DUONEB) 0.5-2.5 (3) MG/3ML SOLN, Take 3 mLs every 4 (four) hours as needed by nebulization., Disp: 360 mL, Rfl: 0 .  levocetirizine (XYZAL) 5 MG tablet, Take 5 mg by mouth every evening., Disp: , Rfl:  .  montelukast (SINGULAIR) 10 MG tablet, Take 1 tablet (10 mg total) by mouth at bedtime., Disp: 90 tablet, Rfl: 1 .  olmesartan-hydrochlorothiazide (BENICAR HCT) 20-12.5 MG tablet, Take 1 tablet  by mouth daily., Disp: 30 tablet, Rfl: 2 .  PROAIR HFA 108 (90 Base) MCG/ACT inhaler, INHALE TWO PUFFS BY MOUTH EVERY 6 HOURS AS NEEDED FOR WHEEZING OR  SHORTNESS  OF  BREATH, Disp: 9 each, Rfl: 0 .  Semaglutide (OZEMPIC) 0.25 or 0.5 MG/DOSE SOPN, Inject 0.5 mg once a week into the skin., Disp: 2 pen, Rfl: 2  Allergies  Allergen Reactions  . Fish Allergy Shortness Of Breath  . Penicillins Anaphylaxis and Rash    Childhood allergy  . Adhesive [Tape] Rash    Paper tape ok to use.    ROS  Ten systems reviewed and is negative except as mentioned in  HPI  Objective  Vitals:   02/28/17 0915  BP: 130/82  Pulse: 92  Resp: 14  Temp: 98.3 F (36.8 C)  TempSrc: Oral  SpO2: 96%  Weight: 260 lb 12.8 oz (118.3 kg)  Height: 5\' 2"  (1.575 m)   Body mass index is 47.7 kg/m.  Nursing Note and Vital Signs reviewed.  Physical Exam  Constitutional: Patient appears well-developed and well-nourished. Obese No distress.  HEENT: head atraumatic, normocephalic, pupils equal and reactive to light, EOM's intact, TM's without erythema or bulging, no maxillary or frontal sinus pain on palpation, neck supple with mild bilateral submandibular lymphadenopathy, oropharynx pink and moist without exudate Cardiovascular: Normal rate, regular rhythm, S1/S2 present.  No murmur or rub heard. No BLE edema. Pulmonary/Chest: Effort normal and breath sounds diminished throughout with intermittent inspiratory wheezing. No respiratory distress or retractions. Abdominal: Soft and equivocal RUQ tenderness, bowel sounds present x4 quadrants.  No CVA Tenderness Psychiatric: Patient has a normal mood and affect. behavior is normal. Judgment and thought content normal.  Recent Results (from the past 2160 hour(s))  POCT HgB A1C     Status: Abnormal   Collection Time: 01/09/17 10:57 AM  Result Value Ref Range   Hemoglobin A1C 6.0   POCT Urinalysis Dipstick     Status: Abnormal   Collection Time: 02/28/17  9:49 AM  Result Value Ref Range   Color, UA yellow    Clarity, UA clear    Glucose, UA neg    Bilirubin, UA neg    Ketones, UA neg    Spec Grav, UA 1.025 1.010 - 1.025   Blood, UA neg    pH, UA 5.0 5.0 - 8.0   Protein, UA neg    Urobilinogen, UA negative (A) 0.2 or 1.0 E.U./dL   Nitrite, UA neg    Leukocytes, UA Negative Negative   Appearance nomal    Odor none    Assessment & Plan  1. Moderate persistent asthma with acute exacerbation - benzonatate (TESSALON) 100 MG capsule; Take 1 capsule (100 mg total) by mouth 2 (two) times daily as needed for cough.   Dispense: 20 capsule; Refill: 0 - azithromycin (ZITHROMAX) 250 MG tablet; Day1: take 2 tabs; Day2-5: take 1 tab daily.  Dispense: 6 tablet; Refill: 0 - Symbicort daily, albuterol neb/inhaler PRN. - Tylenol PRN - Drink plenty of fluids.  2. Right flank pain - POCT Urinalysis Dipstick - Negative - Advised Location may be secondary to Asthma exacerbation - we will treat exacerbation and follow up in 7-10 days to ensure resolution of pain.  If pain persists we will consider labs/imaging at that time. - Return precautions/Emergency Care precautions discussed including worsening pain, nausea or vomiting, frank blood in stool, distended abdomen, and jaundice.  3. Controlled type 2 diabetes mellitus with microalbuminuria, without long-term current use of insulin (  [place]) - Stable, we will avoid oral corticosteroids at this time to avoid altering BG control.  Continue Symbicort daily.  - Work note provided for today and tomorrow - back on 03/02/2017. -Red flags and when to present for emergency care or RTC including fever >101.27F, chest pain, shortness of breath unrelieved by albuterol inhaler, new/worsening/un-resolving symptoms, reviewed with patient at time of visit. Follow up and care instructions discussed and provided in AVS.

## 2017-02-28 NOTE — Patient Instructions (Signed)
Tylenol as needed for pain or fevers. Albuterol inhaler or nebulizer as needed for wheezing/coughing. Drink plenty of water, get plenty of rest.  Upper Respiratory Infection, Adult Most upper respiratory infections (URIs) are caused by a virus. A URI affects the nose, throat, and upper air passages. The most common type of URI is often called "the common cold." Follow these instructions at home:  Take medicines only as told by your doctor.  Gargle warm saltwater or take cough drops to comfort your throat as told by your doctor.  Use a warm mist humidifier or inhale steam from a shower to increase air moisture. This may make it easier to breathe.  Drink enough fluid to keep your pee (urine) clear or pale yellow.  Eat soups and other clear broths.  Have a healthy diet.  Rest as needed.  Go back to work when your fever is gone or your doctor says it is okay. ? You may need to stay home longer to avoid giving your URI to others. ? You can also wear a face mask and wash your hands often to prevent spread of the virus.  Use your inhaler more if you have asthma.  Do not use any tobacco products, including cigarettes, chewing tobacco, or electronic cigarettes. If you need help quitting, ask your doctor. Contact a doctor if:  You are getting worse, not better.  Your symptoms are not helped by medicine.  You have chills.  You are getting more short of breath.  You have brown or red mucus.  You have yellow or brown discharge from your nose.  You have pain in your face, especially when you bend forward.  You have a fever.  You have puffy (swollen) neck glands.  You have pain while swallowing.  You have white areas in the back of your throat. Get help right away if:  You have very bad or constant: ? Headache. ? Ear pain. ? Pain in your forehead, behind your eyes, and over your cheekbones (sinus pain). ? Chest pain.  You have long-lasting (chronic) lung disease and any  of the following: ? Wheezing. ? Long-lasting cough. ? Coughing up blood. ? A change in your usual mucus.  You have a stiff neck.  You have changes in your: ? Vision. ? Hearing. ? Thinking. ? Mood. This information is not intended to replace advice given to you by your health care provider. Make sure you discuss any questions you have with your health care provider. Document Released: 08/08/2007 Document Revised: 10/23/2015 Document Reviewed: 05/27/2013 Elsevier Interactive Patient Education  2018 [employer] A cool mist vaporizer is a device that releases a cool mist into the air. If you have a cough or a cold, using a vaporizer may help relieve your symptoms. The mist adds moisture to the air, which may help thin your mucus and make it less sticky. When your mucus is thin and less sticky, it easier for you to breathe and to cough up secretions. Do not use a vaporizer if you are allergic to mold. Follow these instructions at home:  Follow the instructions that come with the vaporizer.  Do not use anything other than distilled water in the vaporizer.  Do not run the vaporizer all of the time. Doing that can cause mold or bacteria to grow in the vaporizer.  Clean the vaporizer after each time that you use it.  Clean and dry the vaporizer well before storing it.  Stop using the vaporizer  if your breathing symptoms get worse. This information is not intended to replace advice given to you by your health care provider. Make sure you discuss any questions you have with your health care provider. Document Released: 11/17/2003 Document Revised: 09/09/2015 Document Reviewed: 05/21/2015 Elsevier Interactive Patient Education  [employer].

## 2017-03-19 ENCOUNTER — Ambulatory Visit
Admission: RE | Admit: 2017-03-19 | Discharge: 2017-03-19 | Disposition: A | Discharge status: Home / Self Care | Payer: BLUE CROSS/BLUE SHIELD | Source: Ambulatory Visit | Attending: Family Medicine | Admitting: Family Medicine

## 2017-03-19 ENCOUNTER — Encounter: Payer: Self-pay | Admitting: Family Medicine

## 2017-03-19 ENCOUNTER — Ambulatory Visit (INDEPENDENT_AMBULATORY_CARE_PROVIDER_SITE_OTHER): Payer: BLUE CROSS/BLUE SHIELD | Admitting: Family Medicine

## 2017-03-19 VITALS — BP 124/78 | HR 86 | Temp 98.0°F | Resp 18 | Ht 62.0 in | Wt 263.9 lb

## 2017-03-19 DIAGNOSIS — R809 Proteinuria, unspecified: Secondary | ICD-10-CM

## 2017-03-19 DIAGNOSIS — R112 Nausea with vomiting, unspecified: Secondary | ICD-10-CM | POA: Diagnosis not present

## 2017-03-19 DIAGNOSIS — E1129 Type 2 diabetes mellitus with other diabetic kidney complication: Secondary | ICD-10-CM | POA: Diagnosis not present

## 2017-03-19 DIAGNOSIS — R140 Abdominal distension (gaseous): Secondary | ICD-10-CM

## 2017-03-19 DIAGNOSIS — R519 Headache, unspecified: Secondary | ICD-10-CM

## 2017-03-19 DIAGNOSIS — R143 Flatulence: Secondary | ICD-10-CM | POA: Insufficient documentation

## 2017-03-19 DIAGNOSIS — R51 Headache: Secondary | ICD-10-CM

## 2017-03-19 MED ORDER — DICYCLOMINE HCL 20 MG PO TABS: 20.0000 mg | ORAL_TABLET | Freq: Three times a day (TID) | ORAL | 0 refills | Status: DC | PRN

## 2017-03-19 MED ORDER — ONDANSETRON HCL 4 MG PO TABS: 4.0000 mg | ORAL_TABLET | Freq: Three times a day (TID) | ORAL | 0 refills | Status: DC | PRN

## 2017-03-19 NOTE — Patient Instructions (Addendum)
Check blood sugar daily while feeling bad, if >160-180, call our office for further instruction. General Headache Without Cause A headache is pain or discomfort felt around the head or neck area. There are many causes and types of headaches. In some cases, the cause may not be found. Follow these instructions at home: Managing pain  Take over-the-counter and prescription medicines only as told by your doctor.  Lie down in a dark, quiet room when you have a headache.  If directed, apply ice to the head and neck area: ? Put ice in a plastic bag. ? Place a towel between your skin and the bag. ? Leave the ice on for 20 minutes, 2-3 times per day.  Use a heating pad or hot shower to apply heat to the head and neck area as told by your doctor.  Keep lights dim if bright lights bother you or make your headaches worse. Eating and drinking  Eat meals on a regular schedule.  Lessen how much alcohol you drink.  Lessen how much caffeine you drink, or stop drinking caffeine. General instructions  Keep all follow-up visits as told by your doctor. This is important.  Keep a journal to find out if certain things bring on headaches. For example, write down: ? What you eat and drink. ? How much sleep you get. ? Any change to your diet or medicines.  Relax by getting a massage or doing other relaxing activities.  Lessen stress.  Sit up straight. Do not tighten (tense) your muscles.  Do not use tobacco products. This includes cigarettes, chewing tobacco, or e-cigarettes. If you need help quitting, ask your doctor.  Exercise regularly as told by your doctor.  Get enough sleep. This often means 7-9 hours of sleep. Contact a doctor if:  Your symptoms are not helped by medicine.  You have a headache that feels different than the other headaches.  You feel sick to your stomach (nauseous) or you throw up (vomit).  You have a fever. Get help right away if:  Your headache becomes really  bad.  You keep throwing up.  You have a stiff neck.  You have trouble seeing.  You have trouble speaking.  You have pain in the eye or ear.  Your muscles are weak or you lose muscle control.  You lose your balance or have trouble walking.  You feel like you will pass out (faint) or you pass out.  You have confusion. This information is not intended to replace advice given to you by your health care provider. Make sure you discuss any questions you have with your health care provider. Document Released: 11/29/2007 Document Revised: 07/28/2015 Document Reviewed: 06/14/2014 Elsevier Interactive Patient Education  2018 [employer].  Nausea and Vomiting, Adult Feeling sick to your stomach (nausea) means that your stomach is upset or you feel like you have to throw up (vomit). Feeling more and more sick to your stomach can lead to throwing up. Throwing up happens when food and liquid from your stomach are thrown up and out the mouth. Throwing up can make you feel weak and cause you to get dehydrated. Dehydration can make you tired and thirsty, make you have a dry mouth, and make it so you pee (urinate) less often. Older adults and people with other diseases or a weak defense system (immune system) are at higher risk for dehydration. If you feel sick to your stomach or if you throw up, it is important to follow instructions from your doctor  about how to take care of yourself. Follow these instructions at home: Eating and drinking Follow these instructions as told by your doctor:  Take an oral rehydration solution (ORS). This is a drink that is sold at pharmacies and stores.  Drink clear fluids in small amounts as you are able, such as: ? Water. ? Ice chips. ? Diluted fruit juice. ? Low-calorie sports drinks.  Eat bland, easy-to-digest foods in small amounts as you are able, such as: ? Bananas. ? Applesauce. ? Rice. ? Low-fat (lean) meats. ? Toast. ? Crackers.  Avoid fluids  that have a lot of sugar or caffeine in them.  Avoid alcohol.  Avoid spicy or fatty foods.  General instructions  Drink enough fluid to keep your pee (urine) clear or pale yellow.  Wash your hands often. If you cannot use soap and water, use hand sanitizer.  Make sure that all people in your home wash their hands well and often.  Take over-the-counter and prescription medicines only as told by your doctor.  Rest at home while you get better.  Watch your condition for any changes.  Breathe slowly and deeply when you feel sick to your stomach.  Keep all follow-up visits as told by your doctor. This is important. Contact a doctor if:  You have a fever.  You cannot keep fluids down.  Your symptoms get worse.  You have new symptoms.  You feel sick to your stomach for more than two days.  You feel light-headed or dizzy.  You have a headache.  You have muscle cramps. Get help right away if:  You have pain in your chest, neck, arm, or jaw.  You feel very weak or you pass out (faint).  You throw up again and again.  You see blood in your throw-up.  Your throw-up looks like black coffee grounds.  You have bloody or black poop (stools) or poop that look like tar.  You have a very bad headache, a stiff neck, or both.  You have a rash.  You have very bad pain, cramping, or bloating in your belly (abdomen).  You have trouble breathing.  You are breathing very quickly.  Your heart is beating very quickly.  Your skin feels cold and clammy.  You feel confused.  You have pain when you pee.  You have signs of dehydration, such as: ? Dark pee, hardly any pee, or no pee. ? Cracked lips. ? Dry mouth. ? Sunken eyes. ? Sleepiness. ? Weakness. These symptoms may be an emergency. Do not wait to see if the symptoms will go away. Get medical help right away. Call your local emergency services (911 in the [place] Do not drive yourself to the hospital. This  information is not intended to replace advice given to you by your health care provider. Make sure you discuss any questions you have with your health care provider. Document Released: 08/08/2007 Document Revised: 09/09/2015 Document Reviewed: 10/26/2014 Elsevier Interactive Patient Education  2018 [employer].  Abdominal Pain, Adult Many things can cause belly (abdominal) pain. Most times, belly pain is not dangerous. Many cases of belly pain can be watched and treated at home. Sometimes belly pain is serious, though. Your doctor will try to find the cause of your belly pain. Follow these instructions at home:  Take over-the-counter and prescription medicines only as told by your doctor. Do not take medicines that help you poop (laxatives) unless told to by your doctor.  Drink enough fluid to keep your pee (  urine) clear or pale yellow.  Watch your belly pain for any changes.  Keep all follow-up visits as told by your doctor. This is important. Contact a doctor if:  Your belly pain changes or gets worse.  You are not hungry, or you lose weight without trying.  You are having trouble pooping (constipated) or have watery poop (diarrhea) for more than 2-3 days.  You have pain when you pee or poop.  Your belly pain wakes you up at night.  Your pain gets worse with meals, after eating, or with certain foods.  You are throwing up and cannot keep anything down.  You have a fever. Get help right away if:  Your pain does not go away as soon as your doctor says it should.  You cannot stop throwing up.  Your pain is only in areas of your belly, such as the right side or the left lower part of the belly.  You have bloody or black poop, or poop that looks like tar.  You have very bad pain, cramping, or bloating in your belly.  You have signs of not having enough fluid or water in your body (dehydration), such as: ? Dark pee, very little pee, or no pee. ? Cracked lips. ? Dry  mouth. ? Sunken eyes. ? Sleepiness. ? Weakness. This information is not intended to replace advice given to you by your health care provider. Make sure you discuss any questions you have with your health care provider. Document Released: 08/08/2007 Document Revised: 09/09/2015 Document Reviewed: 08/03/2015 Elsevier Interactive Patient Education  2018 [employer].

## 2017-03-19 NOTE — Progress Notes (Signed)
Name: Kelsey Alvarez   MRN: [id number]    DOB: 02-21-1971   Date:03/19/2017       Progress Note  Subjective  Chief Complaint  Chief Complaint  Patient presents with  . Nausea    since yesterday  . Headache    HPI  Patient presents with concern for nausea and headache that began yesterday.  Had hamburger from [employer] the day before and it was raw halfway through.  Yesterday she ate a [employer] Chicken Sandwich and a [employer] and became ill right after eating these.  Vomited 1 time this morning; no diarrhea, no blood in stools/dark and tarry stools.  Nausea and headache seem to occur after she eats.  She has had food poisoning before, and says this feels a little different because of the headache.  Took Tylenol, daily medications, and dramamine this morning which she vomited almost immediately.  Endorses abdominal cramping right after eating.  - Reports has not passed flatus today; did not have BM today (had normal BM yesterday). - Pt has DM: BG yesterday was 120; typically runs 110-115.  Fasting BG this morning was 147. - Has had both fallopian tubes removed; does not have regular periods; no urinary symptoms.  Patient Active Problem List   Diagnosis Date Noted  . B12 deficiency 04/27/2016  . History of endometriosis 11/17/2015  . Menorrhagia with regular cycle 10/12/2015  . Benign essential HTN 12/28/2014  . Cervical polyp 12/28/2014  . Controlled type 2 diabetes mellitus with microalbuminuria ([place]) 12/28/2014  . Dyslipidemia 12/28/2014  . Dysmenorrhea 12/28/2014  . Edema 12/28/2014  . Female infertility 12/28/2014  . Gastro-esophageal reflux disease without esophagitis 12/28/2014  . Morbid obesity ([place]) 12/28/2014  . Asthma, moderate persistent, poorly-controlled 12/28/2014  . Perennial allergic rhinitis 12/28/2014  . Intermittent tremor 12/28/2014  . Vitamin D deficiency 12/28/2014  . LBP (low back pain) 12/28/2014  . Left sciatic nerve pain 12/28/2014    Social  History   Tobacco Use  . Smoking status: Former Smoker    Years: 1.00    Types: Cigarettes    Start date: 12/03/2001    Last attempt to quit: 12/28/2002    Years since quitting: 14.2  . Smokeless tobacco: Never Used  . Tobacco comment: smoked 3 to 4 cigarrettes a day for a year  Substance Use Topics  . Alcohol use: Yes    Alcohol/week: 0.0 oz    Comment: occasionally drinks margarita, once or twice a year     Current Outpatient Medications:  .  aspirin EC 81 MG tablet, Take 1 tablet (81 mg total) by mouth daily., Disp: 30 tablet, Rfl: 0 .  atorvastatin (LIPITOR) 40 MG tablet, Take 1 tablet (40 mg total) by mouth daily., Disp: 90 tablet, Rfl: 1 .  azithromycin (ZITHROMAX) 250 MG tablet, Day1: take 2 tabs; Day2-5: take 1 tab daily., Disp: 6 tablet, Rfl: 0 .  benzonatate (TESSALON) 100 MG capsule, Take 1 capsule (100 mg total) by mouth 2 (two) times daily as needed for cough., Disp: 20 capsule, Rfl: 0 .  budesonide-formoterol (SYMBICORT) 160-4.5 MCG/ACT inhaler, Inhale 2 puffs 2 (two) times daily into the lungs., Disp: 1 Inhaler, Rfl: 2 .  Cholecalciferol (VITAMIN D) 2000 UNITS tablet, Take 1 tablet by mouth daily., Disp: , Rfl:  .  fluticasone (FLONASE) 50 MCG/ACT nasal spray, Place 2 sprays into both nostrils daily., Disp: 16 g, Rfl: 5 .  ipratropium-albuterol (DUONEB) 0.5-2.5 (3) MG/3ML SOLN, Take 3 mLs every 4 (four) hours as needed by nebulization.,  Disp: 360 mL, Rfl: 0 .  levocetirizine (XYZAL) 5 MG tablet, Take 5 mg by mouth every evening., Disp: , Rfl:  .  montelukast (SINGULAIR) 10 MG tablet, Take 1 tablet (10 mg total) by mouth at bedtime., Disp: 90 tablet, Rfl: 1 .  olmesartan-hydrochlorothiazide (BENICAR HCT) 20-12.5 MG tablet, Take 1 tablet by mouth daily., Disp: 30 tablet, Rfl: 2 .  PROAIR HFA 108 (90 Base) MCG/ACT inhaler, INHALE TWO PUFFS BY MOUTH EVERY 6 HOURS AS NEEDED FOR WHEEZING OR  SHORTNESS  OF  BREATH, Disp: 9 each, Rfl: 0 .  Semaglutide (OZEMPIC) 0.25 or 0.5  MG/DOSE SOPN, Inject 0.5 mg once a week into the skin., Disp: 2 pen, Rfl: 2  Allergies  Allergen Reactions  . Fish Allergy Shortness Of Breath  . Penicillins Anaphylaxis and Rash    Childhood allergy  . Adhesive [Tape] Rash    Paper tape ok to use.    ROS  Constitutional: Negative for fever or weight change.  Respiratory: Negative for cough and shortness of breath.   Cardiovascular: Negative for chest pain or palpitations.  Gastrointestinal: See HPI Musculoskeletal: Negative for gait problem or joint swelling.  Skin: Negative for rash.  Neurological: Negative for dizziness; positive for headache.  No other specific complaints in a complete review of systems (except as listed in HPI above).  Objective  Vitals:   03/19/17 0936  BP: 124/78  Pulse: 86  Resp: 18  Temp: 98 F (36.7 C)  TempSrc: Oral  SpO2: 97%  Weight: 263 lb 14.4 oz (119.7 kg)  Height: 5\' 2"  (1.575 m)   Body mass index is 48.27 kg/m.  Nursing Note and Vital Signs reviewed.  Physical Exam  Constitutional: Patient appears well-developed and well-nourished. Obese No distress.  HEENT: head atraumatic, normocephalic Cardiovascular: Normal rate, regular rhythm, S1/S2 present.  No murmur or rub heard. No BLE edema. Pulmonary/Chest: Effort normal and breath sounds clear. No respiratory distress or retractions. Abdominal: Soft, obese abdomen with mild generalized tenderness.  No Masses, HSM, or distension; bowel sounds present x4 quadrants. Psychiatric: Patient has a normal mood and affect. behavior is normal. Judgment and thought content normal. Neurological: he is alert and oriented to person, place, and time. No cranial nerve deficit. Coordination, balance, strength, speech and gait are normal.  Skin: Skin is warm and dry. No rash noted. No erythema. .  Assessment & Plan  1. Nausea and vomiting, intractability of vomiting not specified, unspecified vomiting type - COMPLETE METABOLIC PANEL WITH GFR - CBC  w/Diff/Platelet - Hepatitis, Acute - Lipase - DG Abd Acute W/Chest; Future - dicyclomine (BENTYL) 20 MG tablet; Take 1 tablet (20 mg total) by mouth 3 (three) times daily as needed (abdominal cramping). Take before meals  Dispense: 30 tablet; Refill: 0 - ondansetron (ZOFRAN) 4 MG tablet; Take 1 tablet (4 mg total) by mouth every 8 (eight) hours as needed for nausea or vomiting.  Dispense: 30 tablet; Refill: 0 - Drink plenty of fluids as tolerated.  2. Nonintractable headache, unspecified chronicity pattern, unspecified headache type - Tylenol PRN for headache, drink plenty of fluids. Avoid NSAIDS to avoid further GI upset.  3. Unable to pass flatus - DG Abd Acute W/Chest; Future  - Discussed signs and symptoms of blockage in detail - pt verbalizes understanding.  4. Controlled type 2 diabetes mellitus with microalbuminuria, without long-term current use of insulin ([facility]) - COMPLETE METABOLIC PANEL WITH GFR - Avoid sugary bevarages, check BG daily, notify if >160-180 fasting. Continue regular medications.  -Red  flags and when to present for emergency care or RTC including fever >101.72F, chest pain, shortness of breath, new/worsening/un-resolving symptoms, vomiting & can't stop, severe abdominal pain or distension, vomiting blood, dark tarry/bloody stools, reviewed with patient at time of visit. Follow up and care instructions discussed and provided in AVS.

## 2017-03-20 LAB — COMPLETE METABOLIC PANEL WITH GFR
AG Ratio: 1.2 (calc) (ref 1.0–2.5)
ALT: 14 U/L (ref 6–29)
AST: 13 U/L (ref 10–30)
Albumin: 4.1 g/dL (ref 3.6–5.1)
Alkaline phosphatase (APISO): 69 U/L (ref 33–115)
BUN: 12 mg/dL (ref 7–25)
CO2: 29 mmol/L (ref 20–32)
CREATININE: 0.74 mg/dL (ref 0.50–1.10)
Calcium: 9.5 mg/dL (ref 8.6–10.2)
Chloride: 101 mmol/L (ref 98–110)
GFR, EST AFRICAN AMERICAN: 114 mL/min/{1.73_m2} (ref >=60)
GFR, Est Non African American: 99 mL/min/{1.73_m2} (ref >=60)
Globulin: 3.3 g/dL (calc) (ref 1.9–3.7)
Glucose, Bld: 103 mg/dL — ABNORMAL HIGH (ref 65–99)
Potassium: 4.5 mmol/L (ref 3.5–5.3)
Sodium: 138 mmol/L (ref 135–146)
TOTAL PROTEIN: 7.4 g/dL (ref 6.1–8.1)
Total Bilirubin: 0.4 mg/dL (ref 0.2–1.2)

## 2017-03-20 LAB — CBC WITH DIFFERENTIAL/PLATELET
BASOS ABS: 110 {cells}/uL (ref 0–200)
Basophils Relative: 1.2 %
EOS PCT: 2.1 %
Eosinophils Absolute: 193 cells/uL (ref 15–500)
HEMATOCRIT: 40.7 % (ref 35.0–45.0)
HEMOGLOBIN: 14 g/dL (ref 11.7–15.5)
LYMPHS ABS: 2530 {cells}/uL (ref 850–3900)
MCH: 26.3 pg — ABNORMAL LOW (ref 27.0–33.0)
MCHC: 34.4 g/dL (ref 32.0–36.0)
MCV: 76.5 fL — ABNORMAL LOW (ref 80.0–100.0)
MPV: 9.7 fL (ref 7.5–12.5)
Monocytes Relative: 6.4 %
NEUTROS ABS: 5778 {cells}/uL (ref 1500–7800)
NEUTROS PCT: 62.8 %
Platelets: 309 10*3/uL (ref 140–400)
RBC: 5.32 10*6/uL — ABNORMAL HIGH (ref 3.80–5.10)
RDW: 13.7 % (ref 11.0–15.0)
Total Lymphocyte: 27.5 %
WBC: 9.2 10*3/uL (ref 3.8–10.8)
WBCMIX: 589 {cells}/uL (ref 200–950)

## 2017-03-20 LAB — HEPATITIS PANEL, ACUTE
HEP B C IGM: NONREACTIVE
HEP B S AG: NONREACTIVE
HEP C AB: NONREACTIVE
Hep A IgM: NONREACTIVE
SIGNAL TO CUT-OFF: 0.02 (ref <1.00)

## 2017-03-20 LAB — LIPASE: LIPASE: 32 U/L (ref 7–60)

## 2017-04-11 ENCOUNTER — Ambulatory Visit: Payer: BLUE CROSS/BLUE SHIELD | Admitting: Family Medicine

## 2017-04-17 ENCOUNTER — Ambulatory Visit: Payer: BLUE CROSS/BLUE SHIELD | Admitting: Family Medicine

## 2017-04-23 ENCOUNTER — Encounter: Payer: Self-pay | Admitting: Family Medicine

## 2017-04-23 ENCOUNTER — Ambulatory Visit (INDEPENDENT_AMBULATORY_CARE_PROVIDER_SITE_OTHER): Payer: BLUE CROSS/BLUE SHIELD | Admitting: Family Medicine

## 2017-04-23 VITALS — BP 110/80 | HR 63 | Resp 14 | Ht 62.0 in | Wt 264.5 lb

## 2017-04-23 DIAGNOSIS — R809 Proteinuria, unspecified: Secondary | ICD-10-CM | POA: Diagnosis not present

## 2017-04-23 DIAGNOSIS — E1129 Type 2 diabetes mellitus with other diabetic kidney complication: Secondary | ICD-10-CM

## 2017-04-23 DIAGNOSIS — Z114 Encounter for screening for human immunodeficiency virus [HIV]: Secondary | ICD-10-CM

## 2017-04-23 DIAGNOSIS — I10 Essential (primary) hypertension: Secondary | ICD-10-CM

## 2017-04-23 DIAGNOSIS — E1169 Type 2 diabetes mellitus with other specified complication: Secondary | ICD-10-CM

## 2017-04-23 DIAGNOSIS — K760 Fatty (change of) liver, not elsewhere classified: Secondary | ICD-10-CM | POA: Diagnosis not present

## 2017-04-23 DIAGNOSIS — J069 Acute upper respiratory infection, unspecified: Secondary | ICD-10-CM

## 2017-04-23 DIAGNOSIS — E785 Hyperlipidemia, unspecified: Secondary | ICD-10-CM

## 2017-04-23 DIAGNOSIS — E1140 Type 2 diabetes mellitus with diabetic neuropathy, unspecified: Secondary | ICD-10-CM

## 2017-04-23 DIAGNOSIS — J4540 Moderate persistent asthma, uncomplicated: Secondary | ICD-10-CM

## 2017-04-23 DIAGNOSIS — E538 Deficiency of other specified B group vitamins: Secondary | ICD-10-CM

## 2017-04-23 LAB — POCT GLYCOSYLATED HEMOGLOBIN (HGB A1C)

## 2017-04-23 MED ORDER — CYANOCOBALAMIN 1000 MCG/ML IJ SOLN
1000.0000 ug | Freq: Once | INTRAMUSCULAR | Status: AC
  Administered 2017-04-23: 1000 ug via INTRAMUSCULAR

## 2017-04-23 MED ORDER — PREGABALIN 75 MG PO CAPS: 75.0000 mg | ORAL_CAPSULE | Freq: Two times a day (BID) | ORAL | 2 refills | Status: DC

## 2017-04-23 MED ORDER — BENZONATATE 100 MG PO CAPS: 100.0000 mg | ORAL_CAPSULE | Freq: Two times a day (BID) | ORAL | 0 refills | Status: DC | PRN

## 2017-04-23 MED ORDER — BUDESONIDE-FORMOTEROL FUMARATE 160-4.5 MCG/ACT IN AERO: 2.0000 | INHALATION_SPRAY | Freq: Two times a day (BID) | RESPIRATORY_TRACT | 2 refills | Status: DC

## 2017-04-23 MED ORDER — ATORVASTATIN CALCIUM 40 MG PO TABS: 40.0000 mg | ORAL_TABLET | Freq: Every day | ORAL | 1 refills | Status: DC

## 2017-04-23 MED ORDER — OLMESARTAN MEDOXOMIL-HCTZ 20-12.5 MG PO TABS: 1.0000 | ORAL_TABLET | Freq: Every day | ORAL | 2 refills | Status: DC

## 2017-04-23 MED ORDER — ALBUTEROL SULFATE HFA 108 (90 BASE) MCG/ACT IN AERS: INHALATION_SPRAY | RESPIRATORY_TRACT | 0 refills | Status: DC

## 2017-04-23 MED ORDER — SEMAGLUTIDE(0.25 OR 0.5MG/DOS) 2 MG/1.5ML ~~LOC~~ SOPN: 0.5000 mg | PEN_INJECTOR | SUBCUTANEOUS | 2 refills | Status: DC

## 2017-04-23 MED ORDER — MONTELUKAST SODIUM 10 MG PO TABS: 10.0000 mg | ORAL_TABLET | Freq: Every day | ORAL | 1 refills | Status: DC

## 2017-04-23 NOTE — Progress Notes (Signed)
Name: Kelsey Alvarez   MRN: [id number]    DOB: 27-Feb-1971   Date:04/23/2017       Progress Note  Subjective  Chief Complaint  Chief Complaint  Patient presents with  . Diabetes  . Hypertension    HPI  DM:  hgbA1C is stabe at 5.9% even though out of Ozempic, glucose fasting at home has been controlled in the 120's fasting. Shedeniespolyphagia, polydipsia and polyuria. She has been out of Ozempic for the past month because of cost, could not tolerate Trulicity, she tried Metformin also and it caused indigestion and diarrhea. She has proteinuria and is on ARB. She is still having tingling on her hands and now also having on both feet, she is getting monthly B12 injections, so it may secondary to diabetic neuropathy. We will start Lyrica and monitor.   Morbid Obesity: she had lost9lbs but has been out of Ozempic for over 4 months and has only gained 4.5 lbs over the past 4 months  She is back drinking sodas, but down from 2 liters to 16 oz Pepsi. She also has fatty liver and explained the importance of healthy diet and exercise  Asthma: she has a severe flare 11/2016, went to [facility] and was given z-pack, prednisone, cough medication. She is better, no recent wheezing or SOB, however over the past few days she has noticed rhinorrhea, nasal congestion, sore throat and mild cough. She states asthma seems to be okay. Advised otc medication, we will add cough medication and advised to increase frequency of rescue inhaler and call if any wheezing or SOB  Hyperlipidemia: taking Atorvastatin, no side effects of medication, no chest pain or palpitation  HTN: continue medication, no chest pain or palpitation. No side effects of medication    Patient Active Problem List   Diagnosis Date Noted  . B12 deficiency 04/27/2016  . History of endometriosis 11/17/2015  . Menorrhagia with regular cycle 10/12/2015  . Benign essential HTN 12/28/2014  . Cervical polyp 12/28/2014  . Controlled type 2  diabetes mellitus with microalbuminuria ([place]) 12/28/2014  . Dyslipidemia 12/28/2014  . Dysmenorrhea 12/28/2014  . Edema 12/28/2014  . Female infertility 12/28/2014  . Gastro-esophageal reflux disease without esophagitis 12/28/2014  . Morbid obesity ([place]) 12/28/2014  . Asthma, moderate persistent, poorly-controlled 12/28/2014  . Perennial allergic rhinitis 12/28/2014  . Intermittent tremor 12/28/2014  . Vitamin D deficiency 12/28/2014  . LBP (low back pain) 12/28/2014  . Left sciatic nerve pain 12/28/2014    Past Surgical History:  Procedure Laterality Date  . APPENDECTOMY    . cervical polyp removal    . DILATION AND CURETTAGE OF UTERUS    . HYSTEROSCOPY    . HYSTEROSCOPY W/D&C N/A 10/12/2015   Procedure: DILATATION AND CURETTAGE /HYSTEROSCOPY;  Surgeon: Will Bonnet, MD;  Location: [facility] ORS;  Service: Gynecology;  Laterality: N/A;  . LAPAROSCOPIC BILATERAL SALPINGO OOPHERECTOMY N/A 10/12/2015   Procedure: LAPAROSCOPIC RIGHT SALPINGECTOMY, LEFT OOPHORECTOMY;  Surgeon: Will Bonnet, MD;  Location: [facility] ORS;  Service: Gynecology;  Laterality: N/A;  . LAPAROSCOPIC OVARIAN CYSTECTOMY Bilateral 10/12/2015   Procedure: LAPAROSCOPIC OVARIAN CYSTECTOMY;  Surgeon: Will Bonnet, MD;  Location: [facility] OR[facility];  Service: Gynecology;  Laterality: Bilateral;  . OOPHORECTOMY    . OVARIAN CYST REMOVAL      Family History  Problem Relation Age of Onset  . Hypertension Mother   . Diabetes Mother   . CAD Mother   . Cancer Mother        cervical  . Heart  attack Mother 65       Triple Bypass  . CAD Father   . Hypertension Father   . Arthritis Father   . Thyroid disease Sister   . Alzheimer's disease Maternal Grandmother   . Alzheimer's disease Maternal Grandfather   . Breast cancer Paternal Grandmother     Social History   Socioeconomic History  . Marital status: Married    Spouse name: Not on file  . Number of children: Not on file  . Years of education: Not on file  . Highest  education level: Not on file  Social Needs  . Financial resource strain: Not on file  . Food insecurity - worry: Not on file  . Food insecurity - inability: Not on file  . Transportation needs - medical: Not on file  . Transportation needs - non-medical: Not on file  Occupational History  . Occupation: Museum/gallery curator  Tobacco Use  . Smoking status: Former Smoker    Years: 1.00    Types: Cigarettes    Start date: 12/03/2001    Last attempt to quit: 12/28/2002    Years since quitting: 14.3  . Smokeless tobacco: Never Used  . Tobacco comment: smoked 3 to 4 cigarrettes a day for a year  Substance and Sexual Activity  . Alcohol use: Yes    Alcohol/week: 0.0 oz    Comment: occasionally drinks margarita, once or twice a year  . Drug use: No  . Sexual activity: Not on file    Comment: Partial Hysterectomy  Other Topics Concern  . Not on file  Social History Narrative  . Not on file     Current Outpatient Medications:  .  albuterol (PROAIR HFA) 108 (90 Base) MCG/ACT inhaler, INHALE TWO PUFFS BY MOUTH EVERY 6 HOURS AS NEEDED FOR WHEEZING OR  SHORTNESS  OF  BREATH, Disp: 9 each, Rfl: 0 .  aspirin EC 81 MG tablet, Take 1 tablet (81 mg total) by mouth daily., Disp: 30 tablet, Rfl: 0 .  atorvastatin (LIPITOR) 40 MG tablet, Take 1 tablet (40 mg total) by mouth daily., Disp: 90 tablet, Rfl: 1 .  budesonide-formoterol (SYMBICORT) 160-4.5 MCG/ACT inhaler, Inhale 2 puffs into the lungs 2 (two) times daily., Disp: 1 Inhaler, Rfl: 2 .  Cholecalciferol (VITAMIN D) 2000 UNITS tablet, Take 1 tablet by mouth daily., Disp: , Rfl:  .  fluticasone (FLONASE) 50 MCG/ACT nasal spray, Place 2 sprays into both nostrils daily., Disp: 16 g, Rfl: 5 .  ipratropium-albuterol (DUONEB) 0.5-2.5 (3) MG/3ML SOLN, Take 3 mLs every 4 (four) hours as needed by nebulization., Disp: 360 mL, Rfl: 0 .  levocetirizine (XYZAL) 5 MG tablet, Take 5 mg by mouth every evening., Disp: , Rfl:  .  montelukast (SINGULAIR) 10 MG tablet,  Take 1 tablet (10 mg total) by mouth at bedtime., Disp: 90 tablet, Rfl: 1 .  olmesartan-hydrochlorothiazide (BENICAR HCT) 20-12.5 MG tablet, Take 1 tablet by mouth daily., Disp: 30 tablet, Rfl: 2 .  dicyclomine (BENTYL) 20 MG tablet, Take 1 tablet (20 mg total) by mouth 3 (three) times daily as needed (abdominal cramping). Take before meals (Patient not taking: Reported on 04/23/2017), Disp: 30 tablet, Rfl: 0 .  pregabalin (LYRICA) 75 MG capsule, Take 1 capsule (75 mg total) by mouth 2 (two) times daily., Disp: 60 capsule, Rfl: 2 .  Semaglutide (OZEMPIC) 0.25 or 0.5 MG/DOSE SOPN, Inject 0.5 mg into the skin once a week., Disp: 2 pen, Rfl: 2  Current Facility-Administered Medications:  .  cyanocobalamin ((VITAMIN  B-12)) injection 1,000 mcg, 1,000 mcg, Intramuscular, Once, Steele Sizer, MD  Allergies  Allergen Reactions  . Fish Allergy Shortness Of Breath  . Penicillins Anaphylaxis and Rash    Childhood allergy  . Adhesive [Tape] Rash    Paper tape ok to use.     ROS  Constitutional: Negative for fever or weight change.  Respiratory: Positive  for cough but no  shortness of breath.   Cardiovascular: Negative for chest pain or palpitations.  Gastrointestinal: Positive for intermittent right abdominal pain, no bowel changes.  Musculoskeletal: Negative for gait problem or joint swelling.  Skin: Negative for rash.  Neurological: Negative for dizziness or headache.  No other specific complaints in a complete review of systems (except as listed in HPI above).  Objective  Vitals:   04/23/17 0902  BP: 110/80  Pulse: 63  Resp: 14  SpO2: 97%  Weight: 264 lb 8 oz (120 kg)  Height: 5\' 2"  (1.575 m)    Body mass index is 48.38 kg/m.  Physical Exam  Constitutional: Patient appears well-developed and well-nourished. Obese  No distress.  HEENT: head atraumatic, normocephalic, pupils equal and reactive to light, neck supple, throat within normal limits Cardiovascular: Normal rate,  regular rhythm and normal heart sounds.  No murmur heard. No BLE edema. Pulmonary/Chest: Effort normal and breath sounds normal. No respiratory distress. Abdominal: Soft.  There is no tenderness. Psychiatric: Patient has a normal mood and affect. behavior is normal. Judgment and thought content normal.   Recent Results (from the past 2160 hour(s))  POCT Urinalysis Dipstick     Status: Abnormal   Collection Time: 02/28/17  9:49 AM  Result Value Ref Range   Color, UA yellow    Clarity, UA clear    Glucose, UA neg    Bilirubin, UA neg    Ketones, UA neg    Spec Grav, UA 1.025 1.010 - 1.025   Blood, UA neg    pH, UA 5.0 5.0 - 8.0   Protein, UA neg    Urobilinogen, UA negative (A) 0.2 or 1.0 E.U./dL   Nitrite, UA neg    Leukocytes, UA Negative Negative   Appearance nomal    Odor none   COMPLETE METABOLIC PANEL WITH GFR     Status: Abnormal   Collection Time: 03/19/17 10:18 AM  Result Value Ref Range   Glucose, Bld 103 (H) 65 - 99 mg/dL    Comment: .            Fasting reference interval . For someone without known diabetes, a glucose value between 100 and 125 mg/dL is consistent with prediabetes and should be confirmed with a follow-up test. .    BUN 12 7 - 25 mg/dL   Creat 0.74 0.50 - 1.10 mg/dL   GFR, Est Non African American 99 > OR = 60 mL/min/1.48m2   GFR, Est African American 114 > OR = 60 mL/min/1.68m2   BUN/Creatinine Ratio NOT APPLICABLE 6 - 22 (calc)   Sodium 138 135 - 146 mmol/L   Potassium 4.5 3.5 - 5.3 mmol/L   Chloride 101 98 - 110 mmol/L   CO2 29 20 - 32 mmol/L   Calcium 9.5 8.6 - 10.2 mg/dL   Total Protein 7.4 6.1 - 8.1 g/dL   Albumin 4.1 3.6 - 5.1 g/dL   Globulin 3.3 1.9 - 3.7 g/dL (calc)   AG Ratio 1.2 1.0 - 2.5 (calc)   Total Bilirubin 0.4 0.2 - 1.2 mg/dL   Alkaline phosphatase (APISO) 69 33 -  115 U/L   AST 13 10 - 30 U/L   ALT 14 6 - 29 U/L  CBC w/Diff/Platelet     Status: Abnormal   Collection Time: 03/19/17 10:18 AM  Result Value Ref Range    WBC 9.2 3.8 - 10.8 Thousand/uL   RBC 5.32 (H) 3.80 - 5.10 Million/uL   Hemoglobin 14.0 11.7 - 15.5 g/dL   HCT 40.7 35.0 - 45.0 %   MCV 76.5 (L) 80.0 - 100.0 fL   MCH 26.3 (L) 27.0 - 33.0 pg   MCHC 34.4 32.0 - 36.0 g/dL   RDW 13.7 11.0 - 15.0 %   Platelets 309 140 - 400 Thousand/uL   MPV 9.7 7.5 - 12.5 fL   Neutro Abs 5,778 1,500 - 7,800 cells/uL   Lymphs Abs 2,530 850 - 3,900 cells/uL   WBC mixed population 589 200 - 950 cells/uL   Eosinophils Absolute 193 15 - 500 cells/uL   Basophils Absolute 110 0 - 200 cells/uL   Neutrophils Relative % 62.8 %   Total Lymphocyte 27.5 %   Monocytes Relative 6.4 %   Eosinophils Relative 2.1 %   Basophils Relative 1.2 %  Hepatitis, Acute     Status: None   Collection Time: 03/19/17 10:18 AM  Result Value Ref Range   Hep A IgM NON-REACTIVE NON-REACTI   Hepatitis B Surface Ag NON-REACTIVE NON-REACTI   Hep B C IgM NON-REACTIVE NON-REACTI   Hepatitis C Ab NON-REACTIVE NON-REACTI   SIGNAL TO CUT-OFF 0.02 <1.00  Lipase     Status: None   Collection Time: 03/19/17 10:18 AM  Result Value Ref Range   Lipase 32 7 - 60 U/L      PHQ2/9: Depression screen [facility] 2/9 10/09/2016 06/26/2016 04/27/2016 03/27/2016 11/17/2015  Decreased Interest 0 0 0 0 0  Down, Depressed, Hopeless 0 0 0 0 0  PHQ - 2 Score 0 0 0 0 0     Fall Risk: Fall Risk  04/23/2017 02/28/2017 01/09/2017 10/09/2016 06/26/2016  Falls in the past year? No No No No No     Functional Status Survey: Is the patient deaf or have difficulty hearing?: No Does the patient have difficulty seeing, even when wearing glasses/contacts?: No Does the patient have difficulty concentrating, remembering, or making decisions?: No Does the patient have difficulty walking or climbing stairs?: No Does the patient have difficulty dressing or bathing?: No Does the patient have difficulty doing errands alone such as visiting a doctor's office or shopping?: No    Assessment & Plan  1. Neuropathy due to type 2  diabetes mellitus (HCC)  - pregabalin (LYRICA) 75 MG capsule; Take 1 capsule (75 mg total) by mouth 2 (two) times daily.  Dispense: 60 capsule; Refill: 2 - Semaglutide (OZEMPIC) 0.25 or 0.5 MG/DOSE SOPN; Inject 0.5 mg into the skin once a week.  Dispense: 2 pen; Refill: 2  2. B12 deficiency  - cyanocobalamin ((VITAMIN B-12)) injection 1,000 mcg - Vitamin B12  3. Encounter for screening for HIV  - HIV antibody  4. Controlled type 2 diabetes mellitus with microalbuminuria, without long-term current use of insulin (HCC)  - POCT glycosylated hemoglobin (Hb A1C) - Semaglutide (OZEMPIC) 0.25 or 0.5 MG/DOSE SOPN; Inject 0.5 mg into the skin once a week.  Dispense: 2 pen; Refill: 2  5. Asthma, moderate persistent, well-controlled  - montelukast (SINGULAIR) 10 MG tablet; Take 1 tablet (10 mg total) by mouth at bedtime.  Dispense: 90 tablet; Refill: 1 - albuterol (PROAIR HFA) 108 (90 Base)  MCG/ACT inhaler; INHALE TWO PUFFS BY MOUTH EVERY 6 HOURS AS NEEDED FOR WHEEZING OR  SHORTNESS  OF  BREATH  Dispense: 9 each; Refill: 0  6. Dyslipidemia associated with type 2 diabetes mellitus (HCC)  - Lipid panel - atorvastatin (LIPITOR) 40 MG tablet; Take 1 tablet (40 mg total) by mouth daily.  Dispense: 90 tablet; Refill: 1 - Semaglutide (OZEMPIC) 0.25 or 0.5 MG/DOSE SOPN; Inject 0.5 mg into the skin once a week.  Dispense: 2 pen; Refill: 2  7. Benign essential HTN  - olmesartan-hydrochlorothiazide (BENICAR HCT) 20-12.5 MG tablet; Take 1 tablet by mouth daily.  Dispense: 30 tablet; Refill: 2  8. Morbid obesity due to excess calories [facility])  Discussed with the patient the risk posed by an increased BMI. Discussed importance of portion control, calorie counting and at least 150 minutes of physical activity weekly. Avoid sweet beverages and drink more water. Eat at least 6 servings of fruit and vegetables daily   9. Fatty liver  She continues to have intermittent pain, she will try to resume Ozempic

## 2017-04-23 NOTE — Patient Instructions (Signed)
Obesity, Adult  Obesity is the condition of having too much total body fat. Being overweight or obese means that your weight is greater than what is considered healthy for your body size. Obesity is determined by a measurement called BMI. BMI is an estimate of body fat and is calculated from height and weight. For adults, a BMI of 30 or higher is considered obese.  Obesity can eventually lead to other health concerns and major illnesses, including:  · Stroke.  · Coronary artery disease (CAD).  · Type 2 diabetes.  · Some types of cancer, including cancers of the colon, breast, uterus, and gallbladder.  · Osteoarthritis.  · High blood pressure (hypertension).  · High cholesterol.  · Sleep apnea.  · Gallbladder stones.  · Infertility problems.    What are the causes?  The main cause of obesity is taking in (consuming) more calories than your body uses for energy. Other factors that contribute to this condition may include:  · Being born with genes that make you more likely to become obese.  · Having a medical condition that causes obesity. These conditions include:  ? Hypothyroidism.  ? Polycystic ovarian syndrome (PCOS).  ? Binge-eating disorder.  ? Cushing syndrome.  · Taking certain medicines, such as steroids, antidepressants, and seizure medicines.  · Not being physically active (sedentary lifestyle).  · Living where there are limited places to exercise safely or buy healthy foods.  · Not getting enough sleep.    What increases the risk?  The following factors may increase your risk of this condition:  · Having a family history of obesity.  · Being a woman of African-American descent.  · Being a man of Hispanic descent.    What are the signs or symptoms?  Having excessive body fat is the main symptom of this condition.  How is this diagnosed?  This condition may be diagnosed based on:  · Your symptoms.  · Your medical history.  · A physical exam. Your health care provider may measure:  ? Your BMI. If you are an  adult with a BMI between 25 and less than 30, you are considered overweight. If you are an adult with a BMI of 30 or higher, you are considered obese.  ? The distances around your hips and your waist (circumferences). These may be compared to each other to help diagnose your condition.  ? Your skinfold thickness. Your health care provider may gently pinch a fold of your skin and measure it.    How is this treated?  Treatment for this condition often includes changing your lifestyle. Treatment may include some or all of the following:  · Dietary changes. Work with your health care provider and a dietitian to set a weight-loss goal that is healthy and reasonable for you. Dietary changes may include eating:  ? Smaller portions. A portion size is the amount of a particular food that is healthy for you to eat at one time. This varies from person to person.  ? Low-calorie or low-fat options.  ? More whole grains, fruits, and vegetables.  · Regular physical activity. This may include aerobic activity (cardio) and strength training.  · Medicine to help you lose weight. Your health care provider may prescribe medicine if you are unable to lose 1 pound a week after 6 weeks of eating more healthily and doing more physical activity.  · Surgery. Surgical options may include gastric banding and gastric bypass. Surgery may be done if:  ? Other   treatments have not helped to improve your condition.  ? You have a BMI of 40 or higher.  ? You have life-threatening health problems related to obesity.    Follow these instructions at home:    Eating and drinking    · Follow recommendations from your health care provider about what you eat and drink. Your health care provider may advise you to:  ? Limit fast foods, sweets, and processed snack foods.  ? Choose low-fat options, such as low-fat milk instead of whole milk.  ? Eat 5 or more servings of fruits or vegetables every day.  ? Eat at home more often. This gives you more control over  what you eat.  ? Choose healthy foods when you eat out.  ? Learn what a healthy portion size is.  ? Keep low-fat snacks on hand.  ? Avoid sugary drinks, such as soda, fruit juice, iced tea sweetened with sugar, and flavored milk.  ? Eat a healthy breakfast.  · Drink enough water to keep your urine clear or pale yellow.  · Do not go without eating for long periods of time (do not fast) or follow a fad diet. Fasting and fad diets can be unhealthy and even dangerous.  Physical Activity  · Exercise regularly, as told by your health care provider. Ask your health care provider what types of exercise are safe for you and how often you should exercise.  · Warm up and stretch before being active.  · Cool down and stretch after being active.  · Rest between periods of activity.  Lifestyle  · Limit the time that you spend in front of your TV, computer, or video game system.  · Find ways to reward yourself that do not involve food.  · Limit alcohol intake to no more than 1 drink a day for nonpregnant women and 2 drinks a day for men. One drink equals 12 oz of beer, 5 oz of wine, or 1½ oz of hard liquor.  General instructions  · Keep a weight loss journal to keep track of the food you eat and how much you exercise you get.  · Take over-the-counter and prescription medicines only as told by your health care provider.  · Take vitamins and supplements only as told by your health care provider.  · Consider joining a support group. Your health care provider may be able to recommend a support group.  · Keep all follow-up visits as told by your health care provider. This is important.  Contact a health care provider if:  · You are unable to meet your weight loss goal after 6 weeks of dietary and lifestyle changes.  This information is not intended to replace advice given to you by your health care provider. Make sure you discuss any questions you have with your health care provider.  Document Released: 03/29/2004 Document Revised:  07/25/2015 Document Reviewed: 12/08/2014  [employer]vier Interactive Patient Education © 2018 [employer]

## 2017-04-24 LAB — VITAMIN B12: Vitamin B-12: 617 pg/mL (ref 200–1100)

## 2017-04-24 LAB — LIPID PANEL
CHOL/HDL RATIO: 5.2 (calc) — AB (ref <5.0)
Cholesterol: 206 mg/dL — ABNORMAL HIGH (ref <200)
HDL: 40 mg/dL — ABNORMAL LOW (ref >50)
LDL CHOLESTEROL (CALC): 133 mg/dL — AB
Non-HDL Cholesterol (Calc): 166 mg/dL (calc) — ABNORMAL HIGH (ref <130)
Triglycerides: 193 mg/dL — ABNORMAL HIGH (ref <150)

## 2017-04-24 LAB — HIV ANTIBODY (ROUTINE TESTING W REFLEX): HIV 1&2 Ab, 4th Generation: NONREACTIVE

## 2017-05-07 ENCOUNTER — Encounter: Payer: Self-pay | Admitting: Emergency Medicine

## 2017-05-07 ENCOUNTER — Ambulatory Visit (INDEPENDENT_AMBULATORY_CARE_PROVIDER_SITE_OTHER): Payer: BLUE CROSS/BLUE SHIELD | Admitting: Family Medicine

## 2017-05-07 ENCOUNTER — Encounter: Payer: Self-pay | Admitting: Family Medicine

## 2017-05-07 VITALS — BP 126/72 | HR 84 | Temp 98.0°F | Resp 16 | Ht 62.0 in | Wt 267.2 lb

## 2017-05-07 DIAGNOSIS — R809 Proteinuria, unspecified: Secondary | ICD-10-CM

## 2017-05-07 DIAGNOSIS — E1129 Type 2 diabetes mellitus with other diabetic kidney complication: Secondary | ICD-10-CM

## 2017-05-07 DIAGNOSIS — J4541 Moderate persistent asthma with (acute) exacerbation: Secondary | ICD-10-CM | POA: Diagnosis not present

## 2017-05-07 DIAGNOSIS — J029 Acute pharyngitis, unspecified: Secondary | ICD-10-CM

## 2017-05-07 DIAGNOSIS — R059 Cough, unspecified: Secondary | ICD-10-CM

## 2017-05-07 DIAGNOSIS — B370 Candidal stomatitis: Secondary | ICD-10-CM | POA: Diagnosis not present

## 2017-05-07 DIAGNOSIS — R05 Cough: Secondary | ICD-10-CM | POA: Diagnosis not present

## 2017-05-07 LAB — POCT RAPID STREP A (OFFICE): Rapid Strep A Screen: NEGATIVE

## 2017-05-07 MED ORDER — PROMETHAZINE-DM 6.25-15 MG/5ML PO SYRP: 5.0000 mL | ORAL_SOLUTION | Freq: Three times a day (TID) | ORAL | 0 refills | Status: DC | PRN

## 2017-05-07 MED ORDER — MAGIC MOUTHWASH W/LIDOCAINE: 5.0000 mL | Freq: Four times a day (QID) | ORAL | 0 refills | Status: DC | PRN

## 2017-05-07 MED ORDER — DOXYCYCLINE HYCLATE 100 MG PO TABS: 100.0000 mg | ORAL_TABLET | Freq: Two times a day (BID) | ORAL | 0 refills | Status: AC

## 2017-05-07 MED ORDER — PREDNISONE 10 MG PO TABS: 10.0000 mg | ORAL_TABLET | Freq: Two times a day (BID) | ORAL | 0 refills | Status: AC

## 2017-05-07 NOTE — Patient Instructions (Addendum)
Check Blood Sugar 1-2 times daily - more if not feeling well.  Call our office in sugar is >200.  Cool Mist Vaporizer A cool mist vaporizer is a device that releases a cool mist into the air. If you have a cough or a cold, using a vaporizer may help relieve your symptoms. The mist adds moisture to the air, which may help thin your mucus and make it less sticky. When your mucus is thin and less sticky, it easier for you to breathe and to cough up secretions. Do not use a vaporizer if you are allergic to mold. Follow these instructions at home:  Follow the instructions that come with the vaporizer.  Do not use anything other than distilled water in the vaporizer.  Do not run the vaporizer all of the time. Doing that can cause mold or bacteria to grow in the vaporizer.  Clean the vaporizer after each time that you use it.  Clean and dry the vaporizer well before storing it.  Stop using the vaporizer if your breathing symptoms get worse. This information is not intended to replace advice given to you by your health care provider. Make sure you discuss any questions you have with your health care provider. Document Released: 11/17/2003 Document Revised: 09/09/2015 Document Reviewed: 05/21/2015 Elsevier Interactive Patient Education  2018 [employer].  Diabetes Mellitus and Sick Day Management Blood sugar (glucose) can be difficult to control when you are sick. Common illnesses that can cause problems for people with diabetes (diabetes mellitus) include colds, fever, flu (influenza), nausea, vomiting, and diarrhea. These illnesses can cause stress and loss of body fluids (dehydration), and those issues can cause blood glucose levels to increase. Because of this, it is very important to take your insulin and diabetes medicines and eat some form of carbohydrate when you are sick. You should make a plan for days when you are sick (sick day plan) as part of your diabetes management plan. You and your  health care provider should make this plan in advance. The following guidelines are intended to help you manage an illness that lasts for about 24 hours or less. Your health care provider may also give you more specific instructions. What do I need to do to manage my blood glucose?  Check your blood glucose every 2-4 hours, or as often as told by your health care provider.  Know your sick day treatment goals. Your target blood glucose levels may be different when you are sick.  If you use insulin, take your usual dose. ? If your blood glucose continues to be too high, you may need to take an additional insulin dose as told by your health care provider.  If you use oral diabetes medicine, you may need to stop taking it if you are not able to eat or drink normally. Ask your health care provider about whether you need to stop taking these medicines while you are sick.  If you use injectable hormone medicines other than insulin to control your diabetes, ask your health care provider about whether you need to stop taking these medicines while you are sick. What else can I do to manage my diabetes when I am sick? Check your ketones  If you have type 1 diabetes, check your urine ketones every 4 hours.  If you have type 2 diabetes, check your urine ketones as often as told by your health care provider. Drink fluids  Drink enough fluid to keep your urine clear or pale yellow. This  is especially important if you have a fever, vomiting, or diarrhea. Those symptoms can lead to dehydration.  Follow any instructions from your health care provider about beverages to avoid. ? Do not drink alcohol, caffeine, or drinks that contain a lot of sugar. Take medicines as directed  Take-over-the-counter and prescription medicines only as told by your health care provider.  Check medicine labels for added sugars. Some medicines may contain sugar or types of sugars that can raise your blood glucose level. What  foods can I eat when I am sick? You need to eat some form of carbohydrates when you are sick. You should eat 45-50 grams (45-50 g) of carbohydrates every 3-4 hours until you feel better. All of the food choices below contain about 15 g of carbohydrates. Plan ahead and keep some of these foods around so you have them if you get sick.  4-6 oz (120-177 mL) carbonated beverage that contains sugar, such as regular (not diet) soda. You may be able to drink carbonated beverages more easily if you open the beverage and let it sit at room temperature for a few minutes before drinking.   of a twin frozen ice pop.  4 oz (120 g) regular gelatin.  4 oz (120 mL) fruit juice.  4 oz (120 g) ice cream or frozen yogurt.  2 oz (60 g) sherbet.  8 oz (240 mL) clear broth or soup.  4 oz (120 g) regular custard.  4 oz (120 g) regular pudding.  8 oz (240 g) plain yogurt.  1 slice bread or toast.  6 saltine crackers.  5 vanilla wafers.  Questions to ask your health care provider Consider asking the following questions so you know what to do on days when you are sick:  Should I adjust my diabetes medicines?  How often do I need to check my blood glucose?  What supplies do I need to manage my diabetes at home when I am sick?  What number can I call if I have questions?  What foods and drinks should I avoid?  Contact a health care provider if:  You develop symptoms of diabetic ketoacidosis, such as: ? Fatigue. ? Weight loss. ? Excessive thirst. ? Light-headedness. ? Fruity or sweet-smelling breath. ? Excessive urination. ? Vision changes. ? Confusion or irritability. ? Nausea. ? Vomiting. ? Rapid breathing. ? Pain in the abdomen. ? Feeling flushed.  You are unable to drink fluids without vomiting.  You have any of the following for more than 6 hours: ? Nausea. ? Vomiting. ? Diarrhea.  Your blood glucose is at or above 240 mg/dL (13.3 mmol/L), even after you take an additional  insulin dose.  You have a change in how you think, feel, or act (mental status).  You develop another serious illness.  You have been sick or have had a fever for 2 days or longer and you are not getting better. Get help right away if:  Your blood glucose is lower than 54 mg/dL (3.0 mmol/L).  You have difficulty breathing.  You have moderate or high ketone levels in your urine.  You used emergency glucagon to treat low blood glucose. Summary  Blood sugar (glucose) can be difficult to control when you are sick. Common illnesses that can cause problems for people with diabetes (diabetes mellitus) include colds, fever, flu (influenza), nausea, vomiting, and diarrhea.  Illnesses can cause stress and loss of body fluids (dehydration), and those issues can cause blood glucose levels to increase.  Make a  plan for days when you are sick (sick day plan) as part of your diabetes management plan. You and your health care provider should make this plan in advance.  It is very important to take your insulin and diabetes medicines and to eat some form of carbohydrate when you are sick.  Contact your health care provider if have problems managing your blood glucose levels when you are sick, or if you have been sick or had a fever for 2 days or longer and are not getting better. This information is not intended to replace advice given to you by your health care provider. Make sure you discuss any questions you have with your health care provider. Document Released: 02/22/2003 Document Revised: 11/18/2015 Document Reviewed: 11/18/2015 Elsevier Interactive Patient Education  [employer].

## 2017-05-07 NOTE — Progress Notes (Signed)
Name: Kelsey Alvarez   MRN: [id number]    DOB: 1970/11/28   Date:05/07/2017       Progress Note  Subjective  Chief Complaint  Chief Complaint  Patient presents with  . Sore Throat    cough, congested, headache for 10 days    HPI  Pt presents with concern for ongoing respiratory symptoms - she was seen 04/23/2017 by Dr. Ancil Boozer for follow up and thought she was started to have a cough, so she was given Tessalon.  After her visit her symptoms became worse, and she has tried mucinex, albuterol inhaler, breathing treatments (duoneb), symbicort daily - all without relief.   Current symptoms: bilateral otalgia, productive cough, sinus congestion, sore throat, intermittent fevers, headache Denies: chest pain, body aches, recent travel, rashes.   DM: She is usually well controlled, checks BG's at home a few times a week - usually 115-120 fasting. A1C 04/23/2017 was 5.96% - taking Ozempic alone.  She has taken prednisone before, BG's will run up around the 200's, but she remains cautious and monitors closely when taking.    Patient Active Problem List   Diagnosis Date Noted  . B12 deficiency 04/27/2016  . History of endometriosis 11/17/2015  . Menorrhagia with regular cycle 10/12/2015  . Benign essential HTN 12/28/2014  . Cervical polyp 12/28/2014  . 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 Dyslipidemia 12/28/2014  . Dysmenorrhea 12/28/2014  . Edema 12/28/2014  . Female infertility 12/28/2014  . Gastro-esophageal reflux disease without esophagitis 12/28/2014  . Morbid obesity ([place]) 12/28/2014  . Asthma, moderate persistent, poorly-controlled 12/28/2014  . Perennial allergic rhinitis 12/28/2014  . Intermittent tremor 12/28/2014  . Vitamin D deficiency 12/28/2014  . LBP (low back pain) 12/28/2014  . Left sciatic nerve pain 12/28/2014    Social History   Tobacco Use  . Smoking status: Former Smoker    Years: 1.00    Types: Cigarettes    Start date:  12/03/2001    Last attempt to quit: 12/28/2002    Years since quitting: 14.3  . Smokeless tobacco: Never Used  . Tobacco comment: smoked 3 to 4 cigarrettes a day for a year  Substance Use Topics  . Alcohol use: Yes    Alcohol/week: 0.0 oz    Comment: occasionally drinks margarita, once or twice a year     Current Outpatient Medications:  .  albuterol (PROAIR HFA) 108 (90 Base) MCG/ACT inhaler, INHALE TWO PUFFS BY MOUTH EVERY 6 HOURS AS NEEDED FOR WHEEZING OR  SHORTNESS  OF  BREATH, Disp: 9 each, Rfl: 0 .  aspirin EC 81 MG tablet, Take 1 tablet (81 mg total) by mouth daily., Disp: 30 tablet, Rfl: 0 .  atorvastatin (LIPITOR) 40 MG tablet, Take 1 tablet (40 mg total) by mouth daily., Disp: 90 tablet, Rfl: 1 .  benzonatate (TESSALON) 100 MG capsule, Take 1-2 capsules (100-200 mg total) by mouth 2 (two) times daily as needed., Disp: 40 capsule, Rfl: 0 .  budesonide-formoterol (SYMBICORT) 160-4.5 MCG/ACT inhaler, Inhale 2 puffs into the lungs 2 (two) times daily., Disp: 1 Inhaler, Rfl: 2 .  Cholecalciferol (VITAMIN D) 2000 UNITS tablet, Take 1 tablet by mouth daily., Disp: , Rfl:  .  dicyclomine (BENTYL) 20 MG tablet, Take 1 tablet (20 mg total) by mouth 3 (three) times daily as needed (abdominal cramping). Take before meals, Disp: 30 tablet, Rfl: 0 .  fluticasone (FLONASE) 50 MCG/ACT nasal spray, Place 2 sprays into both nostrils daily., Disp: 16 g, Rfl: 5 .  ipratropium-albuterol (DUONEB) 0.5-2.5 (3) MG/3ML SOLN, Take 3 mLs every 4 (four) hours as needed by nebulization., Disp: 360 mL, Rfl: 0 .  levocetirizine (XYZAL) 5 MG tablet, Take 5 mg by mouth every evening., Disp: , Rfl:  .  montelukast (SINGULAIR) 10 MG tablet, Take 1 tablet (10 mg total) by mouth at bedtime., Disp: 90 tablet, Rfl: 1 .  olmesartan-hydrochlorothiazide (BENICAR HCT) 20-12.5 MG tablet, Take 1 tablet by mouth daily., Disp: 30 tablet, Rfl: 2 .  pregabalin (LYRICA) 75 MG capsule, Take 1 capsule (75 mg total) by mouth 2 (two)  times daily., Disp: 60 capsule, Rfl: 2 .  Semaglutide (OZEMPIC) 0.25 or 0.5 MG/DOSE SOPN, Inject 0.5 mg into the skin once a week., Disp: 2 pen, Rfl: 2  Allergies  Allergen Reactions  . Fish Allergy Shortness Of Breath  . Penicillins Anaphylaxis and Rash    Childhood allergy  . Adhesive [Tape] Rash    Paper tape ok to use.    ROS  Constitutional: Negative for fever or weight change.  Respiratory: See HPI Cardiovascular: Negative for chest pain or palpitations.  Gastrointestinal: Negative for abdominal pain, no bowel changes.  Musculoskeletal: Negative for gait problem or joint swelling.  Skin: Negative for rash.  Neurological: Negative for dizziness or headache.  No other specific complaints in a complete review of systems (except as listed in HPI above).  Objective  Vitals:   05/07/17 1004  BP: 126/72  Pulse: 84  Resp: 16  Temp: 98 F (36.7 C)  TempSrc: Oral  SpO2: 96%  Weight: 267 lb 3.2 oz (121.2 kg)  Height: 5\' 2"  (1.575 m)   Body mass index is 48.87 kg/m.  Nursing Note and Vital Signs reviewed.  Physical Exam  Constitutional: Patient appears well-developed and well-nourished. Obese. No distress.  HEENT: head atraumatic, normocephalic, pupils equal and reactive to light, EOM's intact, TM's without erythema or bulging, no maxillary or frontal sinus tenderness , neck supple without lymphadenopathy, oropharynx pink and moist with white patchy exudate - strep negative Cardiovascular: Normal rate, regular rhythm, S1/S2 present.  No murmur or rub heard. No BLE edema. Pulmonary/Chest: Effort normal and breath sounds diminished throughout with expiratory wheezes. No respiratory distress or retractions. Psychiatric: Patient has a normal mood and affect. behavior is normal. Judgment and thought content normal.  Results for orders placed or performed in visit on 05/07/17 (from the past 72 hour(s))  POCT rapid strep A     Status: None   Collection Time: 05/07/17 10:14 AM   Result Value Ref Range   Rapid Strep A Screen Negative Negative    Assessment & Plan  1. Allergic asthma, moderate persistent, with acute exacerbation - Continue Neb tx PRN, continue daily Symbicort.  Continue mucinex PRN - predniSONE (DELTASONE) 10 MG tablet; Take 1 tablet (10 mg total) by mouth 2 (two) times daily with a meal for 5 days.  Dispense: 10 tablet; Refill: 0 - doxycycline (VIBRA-TABS) 100 MG tablet; Take 1 tablet (100 mg total) by mouth 2 (two) times daily for 7 days.  Dispense: 14 tablet; Refill: 0  2. Sore throat - POCT rapid strep A - magic mouthwash w/lidocaine SOLN; Take 5 mLs by mouth 4 (four) times daily as needed for mouth pain.  Dispense: 100 mL; Refill: 0  3. Oral candidiasis - magic mouthwash w/lidocaine SOLN; Take 5 mLs by mouth 4 (four) times daily as needed for mouth pain.  Dispense: 100 mL; Refill: 0  4. Cough - promethazine-dextromethorphan (PROMETHAZINE-DM) 6.25-15 MG/5ML syrup; Take 5  mLs by mouth 3 (three) times daily as needed for cough.  Dispense: 180 mL; Refill: 0  5. Controlled type 2 diabetes mellitus with microalbuminuria, without long-term current use of insulin ([place]) Discussed sick day management in detail - will call office in BG's are >200 or if not feeling well.  Discussed when to present for Emergency Care.    Work note to return on 05/09/2017 -Red flags and when to present for emergency care or RTC including fever >101.14F, chest pain, shortness of breath unrelieved with medications, new/worsening/un-resolving symptoms, reviewed with patient at time of visit. Follow up and care instructions discussed and provided in AVS.

## 2017-05-09 ENCOUNTER — Other Ambulatory Visit: Payer: Self-pay | Admitting: Family Medicine

## 2017-05-09 DIAGNOSIS — B370 Candidal stomatitis: Secondary | ICD-10-CM

## 2017-05-09 MED ORDER — CLOTRIMAZOLE 10 MG MT TROC: 10.0000 mg | Freq: Every day | OROMUCOSAL | 0 refills | Status: AC

## 2017-05-09 NOTE — Progress Notes (Signed)
Nystatin on national backorder per pharmacy; clotrimazole sent in as alternative per orders.

## 2017-05-28 ENCOUNTER — Other Ambulatory Visit: Payer: Self-pay

## 2017-05-28 NOTE — Telephone Encounter (Signed)
[facility] faxed Kelsey Alvarez is on backorder and to advise them what to do per Dr. Ancil Boozer request. She wanted me to notify patient of change and she is sending in Telmisartan/HCTZ to [facility]. Left voicemail explaining medication is on backorder.

## 2017-05-29 MED ORDER — TELMISARTAN-HCTZ 40-12.5 MG PO TABS: 1.0000 | ORAL_TABLET | Freq: Every day | ORAL | 0 refills | Status: DC

## 2017-06-24 ENCOUNTER — Emergency Department
Admission: EM | Admit: 2017-06-24 | Discharge: 2017-06-24 | Disposition: A | Discharge status: Home / Self Care | Payer: BLUE CROSS/BLUE SHIELD | Attending: Emergency Medicine | Admitting: Emergency Medicine

## 2017-06-24 ENCOUNTER — Telehealth: Payer: Self-pay

## 2017-06-24 ENCOUNTER — Other Ambulatory Visit: Payer: Self-pay

## 2017-06-24 ENCOUNTER — Emergency Department: Payer: BLUE CROSS/BLUE SHIELD

## 2017-06-24 ENCOUNTER — Encounter: Payer: Self-pay | Admitting: Emergency Medicine

## 2017-06-24 DIAGNOSIS — Z87891 Personal history of nicotine dependence: Secondary | ICD-10-CM | POA: Insufficient documentation

## 2017-06-24 DIAGNOSIS — E1142 Type 2 diabetes mellitus with diabetic polyneuropathy: Secondary | ICD-10-CM

## 2017-06-24 DIAGNOSIS — J45909 Unspecified asthma, uncomplicated: Secondary | ICD-10-CM | POA: Diagnosis not present

## 2017-06-24 DIAGNOSIS — Z7982 Long term (current) use of aspirin: Secondary | ICD-10-CM | POA: Diagnosis not present

## 2017-06-24 DIAGNOSIS — E785 Hyperlipidemia, unspecified: Secondary | ICD-10-CM | POA: Insufficient documentation

## 2017-06-24 DIAGNOSIS — Z79899 Other long term (current) drug therapy: Secondary | ICD-10-CM | POA: Diagnosis not present

## 2017-06-24 DIAGNOSIS — K219 Gastro-esophageal reflux disease without esophagitis: Secondary | ICD-10-CM | POA: Diagnosis not present

## 2017-06-24 DIAGNOSIS — E1140 Type 2 diabetes mellitus with diabetic neuropathy, unspecified: Secondary | ICD-10-CM | POA: Insufficient documentation

## 2017-06-24 DIAGNOSIS — I10 Essential (primary) hypertension: Secondary | ICD-10-CM | POA: Diagnosis not present

## 2017-06-24 MED ORDER — METHOCARBAMOL 500 MG PO TABS: 500.0000 mg | ORAL_TABLET | Freq: Four times a day (QID) | ORAL | 0 refills | Status: DC

## 2017-06-24 MED ORDER — MELOXICAM 15 MG PO TABS: 15.0000 mg | ORAL_TABLET | Freq: Every day | ORAL | 0 refills | Status: DC

## 2017-06-24 MED ORDER — OMEPRAZOLE 10 MG PO CPDR: 10.0000 mg | DELAYED_RELEASE_CAPSULE | Freq: Every day | ORAL | 0 refills | Status: DC

## 2017-06-24 MED ORDER — GABAPENTIN 300 MG PO CAPS: 300.0000 mg | ORAL_CAPSULE | Freq: Three times a day (TID) | ORAL | 2 refills | Status: DC

## 2017-06-24 NOTE — Telephone Encounter (Signed)
Copied from [place] [phone]. Topic: Appointment Scheduling - Scheduling Inquiry for Clinic >> Jun 24, 2017  8:07 AM Conception Chancy, NT wrote: Reason for CRM: patient states she has had numbness in her hands and feet for a week. She would like to be seen today, there is not anything available until tomorrow with Dr. Sanda Klein, patient wants to try and be fit in today or to get Dr. Ancil Boozer opinion on what she should do.

## 2017-06-24 NOTE — ED Provider Notes (Signed)
[facility] Emergency Department Provider Note  ____________________________________________  Time seen: Approximately 7:27 PM  I have reviewed the triage vital signs and the nursing notes.   HISTORY  Chief Complaint No chief complaint on file.    HPI Kelsey Alvarez is a 47 y.o. female who presents the emergency department for multiple complaints.  Patient reports that she is having neuropathy in bilateral hands and feet.  Patient has known diagnosis of neuropathy was recently started on Lyrica.  Patient reports that the Lyrica increased her symptoms and she stopped taking same.  Patient is on her feet all day, stocking shelves.  Patient reports that doing so increases the pain.  Patient denies any recent injuries but states that over the past week at the neuropathic pain has been increasing.  She has taken Motrin with no relief.  Patient denies any loss of range of motion, function to hands or legs.  No back pain.  No dysuria, polyuria, hematuria.  No loss of bowel or bladder function, saddle anesthesia, paresthesias.  Patient does report after stocking chills all day, she does have neck pain.  Patient is also complaining of chronic cough times 4 months.  On further questioning, patient states that symptoms began around a URI that she had.  Other symptoms improved with the exception of cough.  Patient denies any shortness of breath or difficulty breathing.  No fevers or chills.  No nasal congestion, ear pain, wheezing.  Patient does endorse increase in her GERD symptoms over the past several months as well.  She reports a burning sensation in her throat at nighttime after laying down.  Patient endorses some mild, chronic sore throat as well.  Patient has a history of diabetes, GERD, neuropathy, hyperlipidemia, hypertension, lumbago, obesity.  Past Medical History:  Diagnosis Date  . Abnormal CBC   . Allergy   . Arthritis    left knee  . Asthma   . Cervical polyp    . Chronic foot pain   . Complication of anesthesia    takes longer to wake up.  . Contact dermatitis   . Diabetes mellitus without complication ([place])   . Dysmenorrhea   . Edema   . 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 GERD (gastroesophageal reflux disease)   . Headache    history of migraines  . Hyperlipidemia   . Hypertension   . Lumbago   . Obesity   . Occasional tremors     Patient Active Problem List   Diagnosis Date Noted  . B12 deficiency 04/27/2016  . History of endometriosis 11/17/2015  . Menorrhagia with regular cycle 10/12/2015  . Benign essential HTN 12/28/2014  . Cervical polyp 12/28/2014  . Controlled type 2 diabetes mellitus with microalbuminuria ([place]) 12/28/2014  . Dyslipidemia 12/28/2014  . Dysmenorrhea 12/28/2014  . Edema 12/28/2014  . Female infertility 12/28/2014  . Gastro-esophageal reflux disease without esophagitis 12/28/2014  . Morbid obesity ([place]) 12/28/2014  . Asthma, moderate persistent, poorly-controlled 12/28/2014  . Perennial allergic rhinitis 12/28/2014  . Intermittent tremor 12/28/2014  . Vitamin D deficiency 12/28/2014  . LBP (low back pain) 12/28/2014  . Left sciatic nerve pain 12/28/2014    Past Surgical History:  Procedure Laterality Date  . APPENDECTOMY    . cervical polyp removal    . DILATION AND CURETTAGE OF UTERUS    . HYSTEROSCOPY    . HYSTEROSCOPY W/D&C N/A 10/12/2015   Procedure: DILATATION AND CURETTAGE /HYSTEROSCOPY;  Surgeon: Will Bonnet, MD;  Location: [facility] ORS;  Service: Gynecology;  Laterality: N/A;  . LAPAROSCOPIC BILATERAL SALPINGO OOPHERECTOMY N/A 10/12/2015   Procedure: LAPAROSCOPIC RIGHT SALPINGECTOMY, LEFT OOPHORECTOMY;  Surgeon: Will Bonnet, MD;  Location: [facility] ORS;  Service: Gynecology;  Laterality: N/A;  . LAPAROSCOPIC OVARIAN CYSTECTOMY Bilateral 10/12/2015   Procedure: LAPAROSCOPIC OVARIAN CYSTECTOMY;  Surgeon: Will Bonnet, MD;  Location: [facility] ORS;  Service: Gynecology;  Laterality: Bilateral;  .  OOPHORECTOMY    . OVARIAN CYST REMOVAL      Prior to Admission medications   Medication Sig Start Date End Date Taking? Authorizing Provider  albuterol (PROAIR HFA) 108 (90 Base) MCG/ACT inhaler INHALE TWO PUFFS BY MOUTH EVERY 6 HOURS AS NEEDED FOR WHEEZING OR  SHORTNESS  OF  BREATH 04/23/17   Steele Sizer, MD  aspirin EC 81 MG tablet Take 1 tablet (81 mg total) by mouth daily. 10/09/16   Steele Sizer, MD  atorvastatin (LIPITOR) 40 MG tablet Take 1 tablet (40 mg total) by mouth daily. 04/23/17   Steele Sizer, MD  budesonide-formoterol (SYMBICORT) 160-4.5 MCG/ACT inhaler Inhale 2 puffs into the lungs 2 (two) times daily. 04/23/17   Steele Sizer, MD  Cholecalciferol (VITAMIN D) 2000 UNITS tablet Take 1 tablet by mouth daily. 08/23/10   [provider]  dicyclomine (BENTYL) 20 MG tablet Take 1 tablet (20 mg total) by mouth 3 (three) times daily as needed (abdominal cramping). Take before meals 03/19/17   Hubbard Hartshorn, FNP  fluticasone (FLONASE) 50 MCG/ACT nasal spray Place 2 sprays into both nostrils daily. 06/26/16   Steele Sizer, MD  gabapentin (NEURONTIN) 300 MG capsule Take 1 capsule (300 mg total) by mouth 3 (three) times daily. 06/24/17 06/24/18  Cuthriell, Charline Bills, PA-C  ipratropium-albuterol (DUONEB) 0.5-2.5 (3) MG/3ML SOLN Take 3 mLs every 4 (four) hours as needed by nebulization. 01/09/17   Steele Sizer, MD  levocetirizine (XYZAL) 5 MG tablet Take 5 mg by mouth every evening.    [provider]  meloxicam (MOBIC) 15 MG tablet Take 1 tablet (15 mg total) by mouth daily. 06/24/17   Cuthriell, Charline Bills, PA-C  methocarbamol (ROBAXIN) 500 MG tablet Take 1 tablet (500 mg total) by mouth 4 (four) times daily. 06/24/17   Cuthriell, Charline Bills, PA-C  montelukast (SINGULAIR) 10 MG tablet Take 1 tablet (10 mg total) by mouth at bedtime. 04/23/17   Steele Sizer, MD  omeprazole (PRILOSEC) 10 MG capsule Take 1 capsule (10 mg total) by mouth daily. 06/24/17   Cuthriell,  Charline Bills, PA-C  pregabalin (LYRICA) 75 MG capsule Take 1 capsule (75 mg total) by mouth 2 (two) times daily. 04/23/17   Steele Sizer, MD  promethazine-dextromethorphan (PROMETHAZINE-DM) 6.25-15 MG/5ML syrup Take 5 mLs by mouth 3 (three) times daily as needed for cough. 05/07/17   Hubbard Hartshorn, FNP  Semaglutide (OZEMPIC) 0.25 or 0.5 MG/DOSE SOPN Inject 0.5 mg into the skin once a week. 04/23/17   Steele Sizer, MD  telmisartan-hydrochlorothiazide (MICARDIS HCT) 40-12.5 MG tablet Take 1 tablet by mouth daily. 05/29/17   Steele Sizer, MD    Allergies Fish allergy; Penicillins; and Adhesive [tape]  Family History  Problem Relation Age of Onset  . Hypertension Mother   . Diabetes Mother   . CAD Mother   . Cancer Mother        cervical  . Heart attack Mother 17       Triple Bypass  . CAD Father   . Hypertension Father   . Arthritis Father   . Thyroid disease Sister   .  Alzheimer's disease Maternal Grandmother   . Alzheimer's disease Maternal Grandfather   . Breast cancer Paternal Grandmother     Social History Social History   Tobacco Use  . Smoking status: Former Smoker    Years: 1.00    Types: Cigarettes    Start date: 12/03/2001    Last attempt to quit: 12/28/2002    Years since quitting: 14.4  . Smokeless tobacco: Never Used  . Tobacco comment: smoked 3 to 4 cigarrettes a day for a year  Substance Use Topics  . Alcohol use: Yes    Alcohol/week: 0.0 oz    Comment: occasionally drinks margarita, once or twice a year  . Drug use: No     Review of Systems  Constitutional: No fever/chills Eyes: No visual changes. No discharge ENT: Positive for chronic mild sore throat. Cardiovascular: no chest pain. Respiratory: Positive cough. No SOB. Gastrointestinal: No abdominal pain.  No nausea, no vomiting.  No diarrhea.  No constipation.  Positive for reflux symptoms at nighttime. Genitourinary: Negative for dysuria. No hematuria Musculoskeletal: Negative for  musculoskeletal pain. Skin: Negative for rash, abrasions, lacerations, ecchymosis. Neurological: Negative for headaches, focal weakness or numbness.  Positive for increasing neuropathic pain to bilateral upper extremities and lower extremities.  No trauma. 10-point ROS otherwise negative.  ____________________________________________   PHYSICAL EXAM:  VITAL SIGNS: ED Triage Vitals  Enc Vitals Group     BP 06/24/17 1836 (!) 149/62     Pulse Rate 06/24/17 1836 64     Resp 06/24/17 1836 16     Temp 06/24/17 1836 98.2 F (36.8 C)     Temp Source 06/24/17 1836 Oral     SpO2 06/24/17 1836 99 %     Weight 06/24/17 1836 262 lb (118.8 kg)     Height 06/24/17 1836 5\' 1"  (1.549 m)     Head Circumference --      Peak Flow --      Pain Score 06/24/17 1903 9     Pain Loc --      Pain Edu? --      Excl. in [place]? --      Constitutional: Alert and oriented. Well appearing and in no acute distress. Eyes: Conjunctivae are normal. PERRL. EOMI. Head: Atraumatic. ENT:      Ears: EACs and TMs unremarkable bilaterally.      Nose: No congestion/rhinnorhea.      Mouth/Throat: Mucous membranes are moist.  Pharynx is nonerythematous and nonedematous.  Uvula is midline. Neck: No stridor.  Neck is supple full range of motion Hematological/Lymphatic/Immunilogical: No cervical lymphadenopathy. Cardiovascular: Normal rate, regular rhythm. Normal S1 and S2.  Good peripheral circulation. Respiratory: Normal respiratory effort without tachypnea or retractions. Lungs CTAB. Good air entry to the bases with no decreased or absent breath sounds. Gastrointestinal: Bowel sounds 4 quadrants. Soft and nontender to palpation. No guarding or rigidity. No palpable masses. No distention. No CVA tenderness. Musculoskeletal: Full range of motion to all extremities. No gross deformities appreciated.  No visible deformity to any extremity.  Radial pulse intact bilateral upper extremities, dorsalis pedis pulse intact bilateral  lower extremities.  Sensation intact in all dermatomal distributions upper and lower extremities. Neurologic:  Normal speech and language. No gross focal neurologic deficits are appreciated.  Skin:  Skin is warm, dry and intact. No rash noted. Psychiatric: Mood and affect are normal. Speech and behavior are normal. Patient exhibits appropriate insight and judgement.   ____________________________________________   LABS (all labs ordered are listed, but only  abnormal results are displayed)  Labs Reviewed - No data to display ____________________________________________  EKG   ____________________________________________  RADIOLOGY Diamantina Providence Cuthriell, personally viewed and evaluated these images (plain radiographs) as part of my medical decision making, as well as reviewing the written report by the radiologist.  Dg Chest 2 View  Result Date: 06/24/2017 CLINICAL DATA:  Chronic cough for the past 4 months. EXAM: CHEST - 2 VIEW COMPARISON:  Chest x-ray dated March 19, 2017. FINDINGS: The heart size and mediastinal contours are within normal limits. Both lungs are clear. The visualized skeletal structures are unremarkable. IMPRESSION: No active cardiopulmonary disease. Electronically Signed   By: Titus Dubin M.D.   On: 06/24/2017 20:30    ____________________________________________    PROCEDURES  Procedure(s) performed:    Procedures    Medications - No data to display   ____________________________________________   INITIAL IMPRESSION / ASSESSMENT AND PLAN / ED COURSE  Pertinent labs & imaging results that were available during my care of the patient were reviewed by me and considered in my medical decision making (see chart for details).  Review of the [place] CSRS was performed in accordance of the [place] prior to dispensing any controlled drugs.  Clinical Course as of Jun 25 2119  Mon Jun 24, 2017  1949 Patient presents the emergency department with  complaints of increasing neuropathy that was worsened with Lyrica.  No recent trauma.  No concerning findings on physical exam.  Patient will be placed on gabapentin instead of Lyrica for neuropathic pain secondary to diabetes.  Patient was also endorsing a chronic sore throat, increased GERD symptoms, cough for the past 4 months.  This started with URI symptoms.  At this time, differential includes chronic cough from GERD, cough from antihypertensive medications, chronic bronchitis, asthma, community-acquired pneumonia.  I suspect that this is secondary to her GERD symptoms but will order chest x-ray for evaluation.   [JC]    Clinical Course User Index [JC] Cuthriell, Charline Bills, PA-C    Patient's diagnosis is consistent with diabetic neuropathy, GERD.  She presented with increased neuropathy in bilateral hands and feet.  No recent trauma.  I suspect that this is just chronically worsening diabetic neuropathy.  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  I will also place patient on a course of anti-inflammatories and muscle relaxer to see if there is a cervical radiculopathy component.  Patient had been on Lyrica for her neuropathy but this increased symptoms.  As such, will trial patient on gabapentin.  While here, patient also endorsed chronic cough and increase in GERD symptoms over the past couple of months.  Due to patient's complaint, chest x-ray was ordered which revealed no acute cardiopulmonary abnormality.  I feel that symptoms are most consistent with increased GERD symptoms.  She will be placed on Prilosec for same..  Patient will be discharged with prescriptions for Prilosec, gabapentin, meloxicam, Robaxin.  Patient will follow primary care as needed.  Patient is given ED precautions to return to the ED for any worsening or new symptoms.     ____________________________________________  FINAL CLINICAL IMPRESSION(S) / ED DIAGNOSES  Final diagnoses:  Diabetic polyneuropathy  associated with type 2 diabetes mellitus ([place])  Gastroesophageal reflux disease, esophagitis presence not specified      NEW MEDICATIONS STARTED DURING THIS VISIT:  ED Discharge Orders        Ordered    omeprazole (PRILOSEC) 10 MG capsule  Daily     06/24/17 2116  gabapentin (NEURONTIN) 300 MG capsule  3 times daily     06/24/17 2116    meloxicam (MOBIC) 15 MG tablet  Daily     06/24/17 2116    methocarbamol (ROBAXIN) 500 MG tablet  4 times daily     06/24/17 2116          This chart was dictated using voice recognition software/Dragon. Despite best efforts to proofread, errors can occur which can change the meaning. Any change was purely unintentional.    Darletta Moll, PA-C 06/24/17 2122    Schuyler Amor, MD 06/27/17 2209

## 2017-06-24 NOTE — ED Triage Notes (Signed)
Pain, numbness, tingling to hands and feet x 1 week.  Patient had been started on Lyrica in the past for the same symptoms, but is not currently taking due to it making pain worse.

## 2017-06-24 NOTE — ED Notes (Signed)

## 2017-06-24 NOTE — Telephone Encounter (Signed)
We can work her in tomorrow at 1 pm

## 2017-06-24 NOTE — Telephone Encounter (Signed)
Spoke with patient and she stated that her husband is taking her to urgent care today and she cancelled tomorrow appt

## 2017-06-25 ENCOUNTER — Ambulatory Visit: Payer: BLUE CROSS/BLUE SHIELD | Admitting: Family Medicine

## 2017-07-01 ENCOUNTER — Telehealth: Payer: Self-pay

## 2017-07-01 NOTE — Telephone Encounter (Signed)
Copied from [place] [id number]. Topic: Inquiry >> Jul 01, 2017 12:57 PM Pricilla Handler wrote: Reason for CRM: Patient wants to know if Dr. Ancil Boozer could possibly see her earlier as she was seen recently in the ED for Neuropathy. Please call patient at [phone].       Thank You!!!

## 2017-07-01 NOTE — Telephone Encounter (Signed)
We can try something for next week. Let' look at schedule together tomorrow

## 2017-07-02 NOTE — Telephone Encounter (Signed)
Spoke with pt. Tried to get her in tomorrow afternoon however she stated she had to work. Therefore she is scheduled for the following Thursday

## 2017-07-17 ENCOUNTER — Ambulatory Visit (INDEPENDENT_AMBULATORY_CARE_PROVIDER_SITE_OTHER): Payer: BLUE CROSS/BLUE SHIELD | Admitting: Family Medicine

## 2017-07-17 ENCOUNTER — Encounter: Payer: Self-pay | Admitting: Family Medicine

## 2017-07-17 VITALS — BP 132/72 | HR 75 | Temp 98.0°F | Resp 16 | Ht 62.0 in | Wt 267.6 lb

## 2017-07-17 DIAGNOSIS — R809 Proteinuria, unspecified: Secondary | ICD-10-CM

## 2017-07-17 DIAGNOSIS — E538 Deficiency of other specified B group vitamins: Secondary | ICD-10-CM

## 2017-07-17 DIAGNOSIS — E1169 Type 2 diabetes mellitus with other specified complication: Secondary | ICD-10-CM

## 2017-07-17 DIAGNOSIS — E1129 Type 2 diabetes mellitus with other diabetic kidney complication: Secondary | ICD-10-CM

## 2017-07-17 DIAGNOSIS — I10 Essential (primary) hypertension: Secondary | ICD-10-CM

## 2017-07-17 DIAGNOSIS — J4540 Moderate persistent asthma, uncomplicated: Secondary | ICD-10-CM

## 2017-07-17 DIAGNOSIS — E785 Hyperlipidemia, unspecified: Secondary | ICD-10-CM

## 2017-07-17 DIAGNOSIS — E1140 Type 2 diabetes mellitus with diabetic neuropathy, unspecified: Secondary | ICD-10-CM

## 2017-07-17 LAB — POCT GLYCOSYLATED HEMOGLOBIN (HGB A1C): HEMOGLOBIN A1C: 6.3

## 2017-07-17 MED ORDER — SEMAGLUTIDE(0.25 OR 0.5MG/DOS) 2 MG/1.5ML ~~LOC~~ SOPN: 0.5000 mg | PEN_INJECTOR | SUBCUTANEOUS | 2 refills | Status: DC

## 2017-07-17 NOTE — Progress Notes (Signed)
Name: Kelsey Alvarez   MRN: [id number]    DOB: 03-03-1971   Date:07/17/2017       Progress Note  Subjective  Chief Complaint  Chief Complaint  Patient presents with  . Peripheral Neuropathy    Onset-06/17/2017, went to the ER was given Gabapentin, Muscle relaxer and Ibuprofen    HPI  DM:  hgbA1C has been at goal, on Ozempic , glucose fasting at home has been controlled in the 115- 120's fasting.Shedeniespolyphagia, polydipsia and polyuria. She is tolerating Ozempic. Trulicity, she tried Metformin also and it caused indigestion and diarrhea. She has proteinuria and is on ARB, last level was normal .She is still having tingling on her hands and now also having on both feet, she is getting monthly B12 injections, so likely secondary  diabetic neuropathy, Lyric did not help with symptoms and she had to go to [facility] on 04/22 with severe hands and feet pain, she was switched to gabapentin, but only able to take it at night because of sedation, pain is better around 6/10 we will refer her to neurologist. Pain has caused her to miss work.   Morbid Obesity:  she has not been physically active, except for work, drinking sodas, not losing weight , gained 2 lbs even on Ozempic, explained importance of life style modification   Asthma:she has a severe flare 11/2016, went to [facility] and was given z-pack, prednisone, cough medication.  She is on Symbicort, she states when she went to [facility] she was placed on Omeprazole because of dry cough and is doing better. She denies any wheezing or SOB at this time  Hyperlipidemia: taking Atorvastatin, no side effects of medication, no chest pain or palpitation. Needs to follow a healthier diet  HTN: continue medication, no chest pain or palpitation.No side effects of medication. Compliant with medication     Patient Active Problem List   Diagnosis Date Noted  . B12 deficiency 04/27/2016  . History of endometriosis 11/17/2015  . Menorrhagia with regular cycle  10/12/2015  . Benign essential HTN 12/28/2014  . Cervical polyp 12/28/2014  . Controlled type 2 diabetes mellitus with microalbuminuria ([place]) 12/28/2014  . Dyslipidemia 12/28/2014  . Dysmenorrhea 12/28/2014  . Edema 12/28/2014  . Female infertility 12/28/2014  . Gastro-esophageal reflux disease without esophagitis 12/28/2014  . Morbid obesity ([place]) 12/28/2014  . Asthma, moderate persistent, poorly-controlled 12/28/2014  . Perennial allergic rhinitis 12/28/2014  . Intermittent tremor 12/28/2014  . Vitamin D deficiency 12/28/2014  . LBP (low back pain) 12/28/2014  . Left sciatic nerve pain 12/28/2014    Past Surgical History:  Procedure Laterality Date  . APPENDECTOMY    . cervical polyp removal    . DILATION AND CURETTAGE OF UTERUS    . HYSTEROSCOPY    . HYSTEROSCOPY W/D&C N/A 10/12/2015   Procedure: DILATATION AND CURETTAGE /HYSTEROSCOPY;  Surgeon: Will Bonnet, MD;  Location: [facility] ORS;  Service: Gynecology;  Laterality: N/A;  . LAPAROSCOPIC BILATERAL SALPINGO OOPHERECTOMY N/A 10/12/2015   Procedure: LAPAROSCOPIC RIGHT SALPINGECTOMY, LEFT OOPHORECTOMY;  Surgeon: Will Bonnet, MD;  Location: [facility] ORS;  Service: Gynecology;  Laterality: N/A;  . LAPAROSCOPIC OVARIAN CYSTECTOMY Bilateral 10/12/2015   Procedure: LAPAROSCOPIC OVARIAN CYSTECTOMY;  Surgeon: Will Bonnet, MD;  Location: [facility] ORS;  Service: Gynecology;  Laterality: Bilateral;  . OOPHORECTOMY    . OVARIAN CYST REMOVAL      Family History  Problem Relation Age of Onset  . Hypertension Mother   . Diabetes Mother   . CAD Mother   .  Cancer Mother        cervical  . Heart attack Mother 70       Triple [place]pass  . CAD Father   . Hypertension Father   . Arthritis Father   . Thyroid disease Sister   . Alzheimer's disease Maternal Grandmother   . Alzheimer's disease Maternal Grandfather   . Breast cancer Paternal Grandmother     Social History   Socioeconomic History  . Marital status: Married    Spouse  name: Not on file  . Number of children: Not on file  . Years of education: Not on file  . Highest education level: Not on file  Occupational History  . Occupation: Museum/gallery curator  Social Needs  . Financial resource strain: Not on file  . Food insecurity:    Worry: Not on file    Inability: Not on file  . Transportation needs:    Medical: Not on file    Non-medical: Not on file  Tobacco Use  . Smoking status: Former Smoker    Years: 1.00    Types: Cigarettes    Start date: 12/03/2001    Last attempt to quit: 12/28/2002    Years since quitting: 14.5  . Smokeless tobacco: Never Used  . Tobacco comment: smoked 3 to 4 cigarrettes a day for a year  Substance and Sexual Activity  . Alcohol use: Yes    Alcohol/week: 0.0 oz    Comment: occasionally drinks margarita, once or twice a year  . Drug use: No  . Sexual activity: Yes    Partners: Male    Comment: Partial Hysterectomy  Lifestyle  . Physical activity:    Days per week: Not on file    Minutes per session: Not on file  . Stress: Not on file  Relationships  . Social connections:    Talks on phone: Not on file    Gets together: Not on file    Attends religious service: Not on file    Active member of club or organization: Not on file    Attends meetings of clubs or organizations: Not on file    Relationship status: Not on file  . Intimate partner violence:    Fear of current or ex partner: Not on file    Emotionally abused: Not on file    Physically abused: Not on file    Forced sexual activity: Not on file  Other Topics Concern  . Not on file  Social History Narrative  . Not on file     Current Outpatient Medications:  .  albuterol (PROAIR HFA) 108 (90 Base) MCG/ACT inhaler, INHALE TWO PUFFS BY MOUTH EVERY 6 HOURS AS NEEDED FOR WHEEZING OR  SHORTNESS  OF  BREATH, Disp: 9 each, Rfl: 0 .  aspirin EC 81 MG tablet, Take 1 tablet (81 mg total) by mouth daily., Disp: 30 tablet, Rfl: 0 .  atorvastatin (LIPITOR) 40 MG  tablet, Take 1 tablet (40 mg total) by mouth daily., Disp: 90 tablet, Rfl: 1 .  budesonide-formoterol (SYMBICORT) 160-4.5 MCG/ACT inhaler, Inhale 2 puffs into the lungs 2 (two) times daily., Disp: 1 Inhaler, Rfl: 2 .  Cholecalciferol (VITAMIN D) 2000 UNITS tablet, Take 1 tablet by mouth daily., Disp: , Rfl:  .  dicyclomine (BENTYL) 20 MG tablet, Take 1 tablet (20 mg total) by mouth 3 (three) times daily as needed (abdominal cramping). Take before meals, Disp: 30 tablet, Rfl: 0 .  fluticasone (FLONASE) 50 MCG/ACT nasal spray, Place 2 sprays into both  nostrils daily., Disp: 16 g, Rfl: 5 .  gabapentin (NEURONTIN) 300 MG capsule, Take 1 capsule (300 mg total) by mouth 3 (three) times daily. (Patient taking differently: Take 300 mg by mouth daily. ), Disp: 90 capsule, Rfl: 2 .  ipratropium-albuterol (DUONEB) 0.5-2.5 (3) MG/3ML SOLN, Take 3 mLs every 4 (four) hours as needed by nebulization., Disp: 360 mL, Rfl: 0 .  levocetirizine (XYZAL) 5 MG tablet, Take 5 mg by mouth every evening., Disp: , Rfl:  .  meloxicam (MOBIC) 15 MG tablet, Take 1 tablet (15 mg total) by mouth daily., Disp: 30 tablet, Rfl: 0 .  methocarbamol (ROBAXIN) 500 MG tablet, Take 1 tablet (500 mg total) by mouth 4 (four) times daily., Disp: 16 tablet, Rfl: 0 .  montelukast (SINGULAIR) 10 MG tablet, Take 1 tablet (10 mg total) by mouth at bedtime., Disp: 90 tablet, Rfl: 1 .  olmesartan-hydrochlorothiazide (BENICAR HCT) 20-12.5 MG tablet, Take 1 tablet by mouth daily., Disp: , Rfl: 2 .  omeprazole (PRILOSEC) 10 MG capsule, Take 1 capsule (10 mg total) by mouth daily., Disp: 30 capsule, Rfl: 0 .  Semaglutide (OZEMPIC) 0.25 or 0.5 MG/DOSE SOPN, Inject 0.5 mg into the skin once a week., Disp: 2 pen, Rfl: 2  Allergies  Allergen Reactions  . Fish Allergy Shortness Of Breath  . Penicillins Anaphylaxis and Rash    Childhood allergy  . Adhesive [Tape] Rash    Paper tape ok to use.     ROS  Constitutional: Negative for fever or weight  change.  Respiratory: positive  for cough and shortness of breath.   Cardiovascular: Negative for chest pain or palpitations.  Gastrointestinal: Negative for abdominal pain, no bowel changes.  Musculoskeletal: Negative for gait problem or joint swelling.  Skin: Negative for rash.  Neurological: Negative for dizziness or headache.  No other specific complaints in a complete review of systems (except as listed in HPI above).  Objective  Vitals:   07/17/17 1046  BP: 132/72  Pulse: 75  Resp: 16  Temp: 98 F (36.7 C)  TempSrc: Oral  SpO2: 98%  Weight: 267 lb 9.6 oz (121.4 kg)  Height: 5\' 2"  (1.575 m)    Body mass index is 48.94 kg/m.  Physical Exam  Constitutional: Patient appears well-developed and well-nourished. Obese No distress.  HEENT: head atraumatic, normocephalic, pupils equal and reactive to light,  neck supple, throat within normal limits Cardiovascular: Normal rate, regular rhythm and normal heart sounds.  No murmur heard. No BLE edema. Pulmonary/Chest: Effort normal and breath sounds normal. No respiratory distress. Abdominal: Soft.  There is no tenderness. Psychiatric: Patient has a normal mood and affect. behavior is normal. Judgment and thought content normal.  Recent Results (from the past 2160 hour(s))  HIV antibody     Status: None   Collection Time: 04/23/17  9:42 AM  Result Value Ref Range   HIV 1&2 Ab, 4th Generation NON-REACTIVE NON-REACTI    Comment: HIV-1 antigen and HIV-1/HIV-2 antibodies were not detected. There is no laboratory evidence of HIV infection. Marland Kitchen PLEASE NOTE: This information has been disclosed to you from records whose confidentiality may be protected by state law.  If your state requires such protection, then the state law prohibits you from making any further disclosure of the information without the specific written consent of the person to whom it pertains, or as otherwise permitted by law. A general authorization for the  release of medical or other information is NOT sufficient for this purpose. . For additional information  please refer to http://education.quest[facility].com/faq/FAQ106 (This link is being provided for informational/ educational purposes only.) . Marland Kitchen The performance of this assay has not been clinically validated in patients less than 4 years old. .   Lipid panel     Status: Abnormal   Collection Time: 04/23/17  9:42 AM  Result Value Ref Range   Cholesterol 206 (H) <200 mg/dL   HDL 40 (L) >50 mg/dL   Triglycerides 193 (H) <150 mg/dL   LDL Cholesterol (Calc) 133 (H) mg/dL (calc)    Comment: Reference range: <100 . Desirable range <100 mg/dL for primary prevention;   <70 mg/dL for patients with CHD or diabetic patients  with > or = 2 CHD risk factors. Marland Kitchen LDL-C is now calculated using the Martin-Hopkins  calculation, which is a validated novel method providing  better accuracy than the Friedewald equation in the  estimation of LDL-C.  Cresenciano Genre et al. Annamaria Helling. 3664;403(47): 2061-2068  (http://education.[facility].com/faq/FAQ164)    Total CHOL/HDL Ratio 5.2 (H) <5.0 (calc)   Non-HDL Cholesterol (Calc) 166 (H) <130 mg/dL (calc)    Comment: For patients with diabetes plus 1 major ASCVD risk  factor, treating to a non-HDL-C goal of <100 mg/dL  (LDL-C of <70 mg/dL) is considered a therapeutic  option.   Vitamin B12     Status: None   Collection Time: 04/23/17  9:42 AM  Result Value Ref Range   Vitamin B-12 617 200 - 1,100 pg/mL  POCT glycosylated hemoglobin (Hb A1C)     Status: Abnormal   Collection Time: 04/23/17  1:21 PM  Result Value Ref Range   Hemoglobin A1C 5.9%   POCT rapid strep A     Status: None   Collection Time: 05/07/17 10:14 AM  Result Value Ref Range   Rapid Strep A Screen Negative Negative    PHQ2/9: Depression screen [facility] 2/9 07/17/2017 10/09/2016 06/26/2016 04/27/2016 03/27/2016  Decreased Interest 0 0 0 0 0  Down, Depressed, Hopeless 0 0 0 0 0  PHQ - 2  Score 0 0 0 0 0     Fall Risk: Fall Risk  07/17/2017 04/23/2017 02/28/2017 01/09/2017 10/09/2016  Falls in the past year? No No No No No     Functional Status Survey: Is the patient deaf or have difficulty hearing?: No Does the patient have difficulty seeing, even when wearing glasses/contacts?: Yes(prescription glasses) Does the patient have difficulty concentrating, remembering, or making decisions?: No Does the patient have difficulty walking or climbing stairs?: No Does the patient have difficulty dressing or bathing?: No Does the patient have difficulty doing errands alone such as visiting a doctor's office or shopping?: No    Assessment & Plan  1. Controlled type 2 diabetes mellitus with microalbuminuria, without long-term current use of insulin (HCC)  - POCT HgB A1C - Semaglutide (OZEMPIC) 0.25 or 0.5 MG/DOSE SOPN; Inject 0.5 mg into the skin once a week.  Dispense: 2 pen; Refill: 2  2. Neuropathy due to type 2 diabetes mellitus (HCC)  - POCT HgB A1C - Semaglutide (OZEMPIC) 0.25 or 0.5 MG/DOSE SOPN; Inject 0.5 mg into the skin once a week.  Dispense: 2 pen; Refill: 2 - Ambulatory referral to Neurology  3. Dyslipidemia associated with type 2 diabetes mellitus (HCC)  - Semaglutide (OZEMPIC) 0.25 or 0.5 MG/DOSE SOPN; Inject 0.5 mg into the skin once a week.  Dispense: 2 pen; Refill: 2  4. Asthma, moderate persistent, well-controlled  Continue daily medication   5. B12 deficiency  Currently on supplementation and last level  was normal  6. Morbid obesity due to excess calories [facility])  Discussed with the patient the risk posed by an increased BMI. Discussed importance of portion control, calorie counting and at least 150 minutes of physical activity weekly. Avoid sweet beverages and drink more water. Eat at least 6 servings of fruit and vegetables daily   7. Benign essential HTN  At goal   8. Dyslipidemia  On statin, last lipid was not at goal, needs to be compliant  with diet and exercise

## 2017-07-26 ENCOUNTER — Ambulatory Visit: Payer: BLUE CROSS/BLUE SHIELD | Admitting: Family Medicine

## 2017-07-31 ENCOUNTER — Other Ambulatory Visit: Payer: Self-pay | Admitting: Family Medicine

## 2017-07-31 DIAGNOSIS — J4540 Moderate persistent asthma, uncomplicated: Secondary | ICD-10-CM

## 2017-07-31 NOTE — Telephone Encounter (Signed)
Refill request for general medication. Proair to [facility].   Last office visit: 07/17/2017   Follow up on 10/17/17

## 2017-08-18 ENCOUNTER — Emergency Department
Admission: EM | Admit: 2017-08-18 | Discharge: 2017-08-18 | Disposition: A | Discharge status: Home / Self Care | Payer: BLUE CROSS/BLUE SHIELD | Attending: Emergency Medicine | Admitting: Emergency Medicine

## 2017-08-18 ENCOUNTER — Emergency Department: Payer: BLUE CROSS/BLUE SHIELD

## 2017-08-18 ENCOUNTER — Other Ambulatory Visit: Payer: Self-pay

## 2017-08-18 DIAGNOSIS — R102 Pelvic and perineal pain: Secondary | ICD-10-CM | POA: Diagnosis not present

## 2017-08-18 DIAGNOSIS — E119 Type 2 diabetes mellitus without complications: Secondary | ICD-10-CM | POA: Insufficient documentation

## 2017-08-18 DIAGNOSIS — R42 Dizziness and giddiness: Secondary | ICD-10-CM | POA: Insufficient documentation

## 2017-08-18 DIAGNOSIS — Z87891 Personal history of nicotine dependence: Secondary | ICD-10-CM | POA: Insufficient documentation

## 2017-08-18 DIAGNOSIS — J4540 Moderate persistent asthma, uncomplicated: Secondary | ICD-10-CM | POA: Diagnosis not present

## 2017-08-18 DIAGNOSIS — I10 Essential (primary) hypertension: Secondary | ICD-10-CM | POA: Insufficient documentation

## 2017-08-18 HISTORY — DX: Polyneuropathy, unspecified: G62.9

## 2017-08-18 LAB — URINALYSIS, COMPLETE (UACMP) WITH MICROSCOPIC
BILIRUBIN URINE: NEGATIVE
Glucose, UA: NEGATIVE mg/dL
HGB URINE DIPSTICK: NEGATIVE
Ketones, ur: NEGATIVE mg/dL
LEUKOCYTES UA: NEGATIVE
Nitrite: NEGATIVE
PH: 5 (ref 5.0–8.0)
Protein, ur: NEGATIVE mg/dL
SPECIFIC GRAVITY, URINE: 1.018 (ref 1.005–1.030)

## 2017-08-18 LAB — BASIC METABOLIC PANEL
Anion gap: 11 (ref 5–15)
BUN: 9 mg/dL (ref 6–20)
CALCIUM: 8.6 mg/dL — AB (ref 8.9–10.3)
CO2: 24 mmol/L (ref 22–32)
CREATININE: 0.8 mg/dL (ref 0.44–1.00)
Chloride: 98 mmol/L — ABNORMAL LOW (ref 101–111)
GFR calc non Af Amer: >60 mL/min (ref >60)
Glucose, Bld: 221 mg/dL — ABNORMAL HIGH (ref 65–99)
Potassium: 3.7 mmol/L (ref 3.5–5.1)
SODIUM: 133 mmol/L — AB (ref 135–145)

## 2017-08-18 LAB — CBC
HCT: 41.1 % (ref 35.0–47.0)
Hemoglobin: 14 g/dL (ref 12.0–16.0)
MCH: 27.2 pg (ref 26.0–34.0)
MCHC: 34.2 g/dL (ref 32.0–36.0)
MCV: 79.5 fL — ABNORMAL LOW (ref 80.0–100.0)
PLATELETS: 245 10*3/uL (ref 150–440)
RBC: 5.16 MIL/uL (ref 3.80–5.20)
RDW: 14.1 % (ref 11.5–14.5)
WBC: 9.6 10*3/uL (ref 3.6–11.0)

## 2017-08-18 LAB — PREGNANCY, URINE: Preg Test, Ur: NEGATIVE

## 2017-08-18 LAB — GLUCOSE, CAPILLARY: GLUCOSE-CAPILLARY: 217 mg/dL — AB (ref 65–99)

## 2017-08-18 MED ORDER — OXYCODONE-ACETAMINOPHEN 5-325 MG PO TABS: 1.0000 | ORAL_TABLET | Freq: Three times a day (TID) | ORAL | 0 refills | Status: DC | PRN

## 2017-08-18 MED ORDER — NORETHINDRON-ETHINYL ESTRAD-FE 1-20/1-30/1-35 MG-MCG PO TABS: 1.0000 | ORAL_TABLET | Freq: Every day | ORAL | 11 refills | Status: DC

## 2017-08-18 MED ORDER — OXYCODONE-ACETAMINOPHEN 5-325 MG PO TABS
2.0000 | ORAL_TABLET | Freq: Once | ORAL | Status: AC
  Administered 2017-08-18: 2 via ORAL
  Filled 2017-08-18: qty 2

## 2017-08-18 MED ORDER — PROMETHAZINE HCL 25 MG PO TABS
25.0000 mg | ORAL_TABLET | Freq: Once | ORAL | Status: AC
  Administered 2017-08-18: 25 mg via ORAL
  Filled 2017-08-18: qty 1

## 2017-08-18 NOTE — ED Provider Notes (Signed)
[facility] Emergency Department Provider Note       Time seen: ----------------------------------------- 5:34 PM on 08/18/2017 -----------------------------------------   I have reviewed the triage vital signs and the nursing notes.  HISTORY   Chief Complaint Weakness    HPI Glessie Eustice is a 47 y.o. female with a history of allergies, diabetes, endometriosis, hyperlipidemia and hypertension who presents to the ED for feeling weak like her blood sugar was dropping.  Patient was crying and hyperventilating on arrival.  She was feeling weak and nauseous, not like she would pass out however.  She does have some pelvic pain and reports she has a history of endometriosis and ovarian cyst.  She denies fevers, chills or other complaints.  Past Medical History:  Diagnosis Date  . Abnormal CBC   . Allergy   . Arthritis    left knee  . Asthma   . Cervical polyp   . Chronic foot pain   . Complication of anesthesia    takes longer to wake up.  . Contact dermatitis   . Diabetes mellitus without complication ([place])   . Dysmenorrhea   . Edema   . 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 GERD (gastroesophageal reflux disease)   . Headache    history of migraines  . Hyperlipidemia   . Hypertension   . Lumbago   . Neuropathy   . Obesity   . Occasional tremors     Patient Active Problem List   Diagnosis Date Noted  . B12 deficiency 04/27/2016  . History of endometriosis 11/17/2015  . Menorrhagia with regular cycle 10/12/2015  . Benign essential HTN 12/28/2014  . Cervical polyp 12/28/2014  . Controlled type 2 diabetes mellitus with microalbuminuria ([place]) 12/28/2014  . Dyslipidemia 12/28/2014  . Dysmenorrhea 12/28/2014  . Edema 12/28/2014  . Female infertility 12/28/2014  . Gastro-esophageal reflux disease without esophagitis 12/28/2014  . Morbid obesity ([place]) 12/28/2014  . Asthma, moderate persistent, poorly-controlled 12/28/2014  . Perennial allergic  rhinitis 12/28/2014  . Intermittent tremor 12/28/2014  . Vitamin D deficiency 12/28/2014  . LBP (low back pain) 12/28/2014  . Left sciatic nerve pain 12/28/2014    Past Surgical History:  Procedure Laterality Date  . APPENDECTOMY    . cervical polyp removal    . DILATION AND CURETTAGE OF UTERUS    . HYSTEROSCOPY    . HYSTEROSCOPY W/D&C N/A 10/12/2015   Procedure: DILATATION AND CURETTAGE /HYSTEROSCOPY;  Surgeon: Will Bonnet, MD;  Location: [facility] ORS;  Service: Gynecology;  Laterality: N/A;  . LAPAROSCOPIC BILATERAL SALPINGO OOPHERECTOMY N/A 10/12/2015   Procedure: LAPAROSCOPIC RIGHT SALPINGECTOMY, LEFT OOPHORECTOMY;  Surgeon: Will Bonnet, MD;  Location: [facility] ORS;  Service: Gynecology;  Laterality: N/A;  . LAPAROSCOPIC OVARIAN CYSTECTOMY Bilateral 10/12/2015   Procedure: LAPAROSCOPIC OVARIAN CYSTECTOMY;  Surgeon: Will Bonnet, MD;  Location: [facility] ORS;  Service: Gynecology;  Laterality: Bilateral;  . OOPHORECTOMY    . OVARIAN CYST REMOVAL      Allergies Fish allergy; Penicillins; and Adhesive [tape]  Social History Social History   Tobacco Use  . Smoking status: Former Smoker    Years: 1.00    Types: Cigarettes    Start date: 12/03/2001    Last attempt to quit: 12/28/2002    Years since quitting: 14.6  . Smokeless tobacco: Never Used  . Tobacco comment: smoked 3 to 4 cigarrettes a day for a year  Substance Use Topics  . Alcohol use: Yes    Alcohol/week: 0.0 oz    Comment:  occasionally drinks margarita, once or twice a year  . Drug use: No   Review of Systems Constitutional: Negative for fever. Cardiovascular: Negative for chest pain. Respiratory: Negative for shortness of breath. Gastrointestinal: Negative for abdominal pain, vomiting and diarrhea. Genitourinary: Positive for pelvic pain Musculoskeletal: Negative for back pain. Skin: Negative for rash. Neurological: Negative for headaches, positive for generalized weakness  All systems  negative/normal/unremarkable except as stated in the HPI  ____________________________________________   PHYSICAL EXAM:  VITAL SIGNS: ED Triage Vitals  Enc Vitals Group     BP 08/18/17 1606 131/67     Pulse Rate 08/18/17 1606 74     Resp 08/18/17 1606 18     Temp 08/18/17 1606 97.7 F (36.5 C)     Temp Source 08/18/17 1606 Oral     SpO2 08/18/17 1606 98 %     Weight 08/18/17 1606 265 lb (120.2 kg)     Height 08/18/17 1606 5\' 1"  (1.549 m)     Head Circumference --      Peak Flow --      Pain Score 08/18/17 1611 9     Pain Loc --      Pain Edu? --      Excl. in [place]? --    Constitutional: Alert and oriented.  Anxious, no distress Eyes: Conjunctivae are normal. Normal extraocular movements. ENT   Head: Normocephalic and atraumatic.   Nose: No congestion/rhinnorhea.   Mouth/Throat: Mucous membranes are moist.   Neck: No stridor. Cardiovascular: Normal rate, regular rhythm. No murmurs, rubs, or gallops. Respiratory: Normal respiratory effort without tachypnea nor retractions. Breath sounds are clear and equal bilaterally. No wheezes/rales/rhonchi. Gastrointestinal: Nonfocal pelvic tenderness, no rebound or guarding.  Normal bowel sounds. Musculoskeletal: Nontender with normal range of motion in extremities. No lower extremity tenderness nor edema. Neurologic:  Normal speech and language. No gross focal neurologic deficits are appreciated.  Skin:  Skin is warm, dry and intact. No rash noted. Psychiatric: Mood and affect are normal. Speech and behavior are normal.  ____________________________________________  EKG: Interpreted by me.  Sinus rhythm rate 78 bpm, normal PR interval, normal QRS, normal QT.  ____________________________________________  ED COURSE:  As part of my medical decision making, I reviewed the following data within the [facility] History obtained from family if available, nursing notes, old chart and ekg, as well as notes from  prior ED visits. Patient presented for weakness, we will assess with labs and imaging as indicated at this time.   Procedures ____________________________________________   LABS (pertinent positives/negatives)  Labs Reviewed  GLUCOSE, CAPILLARY - Abnormal; Notable for the following components:      Result Value   Glucose-Capillary 217 (*)    All other components within normal limits  BASIC METABOLIC PANEL - Abnormal; Notable for the following components:   Sodium 133 (*)    Chloride 98 (*)    Glucose, Bld 221 (*)    Calcium 8.6 (*)    All other components within normal limits  CBC - Abnormal; Notable for the following components:   MCV 79.5 (*)    All other components within normal limits  URINALYSIS, COMPLETE (UACMP) WITH MICROSCOPIC - Abnormal; Notable for the following components:   Color, Urine YELLOW (*)    APPearance CLEAR (*)    Bacteria, UA RARE (*)    All other components within normal limits  PREGNANCY, URINE  CBG MONITORING, ED  POC URINE PREG, ED    RADIOLOGY  Pelvic ultrasound IMPRESSION: 1.  Status post left oophorectomy.  No left adnexal mass. 2. Negative for right ovarian suspicious mass or torsion. 3. Fibroid uterus including partially exophytic left fundal fibroid measuring 4.6 cm, increased in size. 4. Trace free fluid in the pelvis  ____________________________________________  DIFFERENTIAL DIAGNOSIS   Anxiety, dehydration, electrolyte abnormality, ovarian cyst, endometriosis, UTI  FINAL ASSESSMENT AND PLAN  Dizziness   Plan: The patient had presented for weakness and dizziness. Patient's labs did not reveal any acute process. Patient's imaging was also negative.  No clear etiology for her symptoms, likely multifactorial.  She is cleared for outpatient follow-up.   Laurence Aly, MD   Note: This note was generated in part or whole with voice recognition software. Voice recognition is usually quite accurate but there are  transcription errors that can and very often do occur. I apologize for any typographical errors that were not detected and corrected.     Earleen Newport, MD 08/18/17 [phone]

## 2017-08-18 NOTE — ED Triage Notes (Addendum)
Pt arrives to ED alert, oriented, in wheelchair. States type 2 DM, not taking anything for it d/t CBG "bottoming out." visitor states it rarely drops. Pt crying and hyperventilating in triage. Appears very anxious. Feels weak and nausea. Pt states CBG usually 115. Took a glucose tablet at home. CBG 217 in triage. C/o L pelvic pain.   Hx endometriosis. Recent UTI on Friday. Started on antibiotic and pain medicine. Took them at 1p today.

## 2017-08-21 ENCOUNTER — Emergency Department: Payer: BLUE CROSS/BLUE SHIELD

## 2017-08-21 ENCOUNTER — Encounter: Payer: Self-pay | Admitting: Emergency Medicine

## 2017-08-21 ENCOUNTER — Emergency Department
Admission: EM | Admit: 2017-08-21 | Discharge: 2017-08-21 | Disposition: A | Discharge status: Home / Self Care | Payer: BLUE CROSS/BLUE SHIELD | Attending: Emergency Medicine | Admitting: Emergency Medicine

## 2017-08-21 ENCOUNTER — Other Ambulatory Visit: Payer: Self-pay

## 2017-08-21 DIAGNOSIS — R102 Pelvic and perineal pain: Secondary | ICD-10-CM | POA: Diagnosis not present

## 2017-08-21 DIAGNOSIS — M545 Low back pain, unspecified: Secondary | ICD-10-CM

## 2017-08-21 DIAGNOSIS — Z87891 Personal history of nicotine dependence: Secondary | ICD-10-CM | POA: Diagnosis not present

## 2017-08-21 DIAGNOSIS — J45909 Unspecified asthma, uncomplicated: Secondary | ICD-10-CM | POA: Insufficient documentation

## 2017-08-21 DIAGNOSIS — I10 Essential (primary) hypertension: Secondary | ICD-10-CM | POA: Diagnosis not present

## 2017-08-21 DIAGNOSIS — E1140 Type 2 diabetes mellitus with diabetic neuropathy, unspecified: Secondary | ICD-10-CM | POA: Insufficient documentation

## 2017-08-21 LAB — URINALYSIS, COMPLETE (UACMP) WITH MICROSCOPIC
Bilirubin Urine: NEGATIVE
GLUCOSE, UA: NEGATIVE mg/dL
Hgb urine dipstick: NEGATIVE
KETONES UR: NEGATIVE mg/dL
LEUKOCYTES UA: NEGATIVE
NITRITE: NEGATIVE
PH: 5 (ref 5.0–8.0)
Protein, ur: NEGATIVE mg/dL
SPECIFIC GRAVITY, URINE: 1.017 (ref 1.005–1.030)

## 2017-08-21 LAB — CBC WITH DIFFERENTIAL/PLATELET
BASOS ABS: 0.1 10*3/uL (ref 0–0.1)
Basophils Relative: 1 %
EOS PCT: 2 %
Eosinophils Absolute: 0.1 10*3/uL (ref 0–0.7)
HCT: 38.7 % (ref 35.0–47.0)
Hemoglobin: 13.1 g/dL (ref 12.0–16.0)
Lymphocytes Relative: 32 %
Lymphs Abs: 2.3 10*3/uL (ref 1.0–3.6)
MCH: 26.8 pg (ref 26.0–34.0)
MCHC: 33.9 g/dL (ref 32.0–36.0)
MCV: 79.2 fL — AB (ref 80.0–100.0)
MONO ABS: 0.5 10*3/uL (ref 0.2–0.9)
MONOS PCT: 7 %
Neutro Abs: 4.3 10*3/uL (ref 1.4–6.5)
Neutrophils Relative %: 58 %
PLATELETS: 272 10*3/uL (ref 150–440)
RBC: 4.89 MIL/uL (ref 3.80–5.20)
RDW: 14.4 % (ref 11.5–14.5)
WBC: 7.3 10*3/uL (ref 3.6–11.0)

## 2017-08-21 LAB — COMPREHENSIVE METABOLIC PANEL
ALT: 14 U/L (ref 14–54)
ANION GAP: 9 (ref 5–15)
AST: 21 U/L (ref 15–41)
Albumin: 3.6 g/dL (ref 3.5–5.0)
Alkaline Phosphatase: 49 U/L (ref 38–126)
BUN: 10 mg/dL (ref 6–20)
CO2: 23 mmol/L (ref 22–32)
Calcium: 8.3 mg/dL — ABNORMAL LOW (ref 8.9–10.3)
Chloride: 103 mmol/L (ref 101–111)
Creatinine, Ser: 0.69 mg/dL (ref 0.44–1.00)
Glucose, Bld: 120 mg/dL — ABNORMAL HIGH (ref 65–99)
POTASSIUM: 3.8 mmol/L (ref 3.5–5.1)
Sodium: 135 mmol/L (ref 135–145)
TOTAL PROTEIN: 6.9 g/dL (ref 6.5–8.1)
Total Bilirubin: 0.5 mg/dL (ref 0.3–1.2)

## 2017-08-21 LAB — LIPASE, BLOOD: LIPASE: 28 U/L (ref 11–51)

## 2017-08-21 MED ORDER — ONDANSETRON HCL 4 MG/2ML IJ SOLN
4.0000 mg | Freq: Once | INTRAMUSCULAR | Status: AC
  Administered 2017-08-21: 4 mg via INTRAVENOUS
  Filled 2017-08-21: qty 2

## 2017-08-21 MED ORDER — MORPHINE SULFATE (PF) 4 MG/ML IV SOLN
4.0000 mg | Freq: Once | INTRAVENOUS | Status: AC
  Administered 2017-08-21: 4 mg via INTRAVENOUS
  Filled 2017-08-21: qty 1

## 2017-08-21 MED ORDER — DIAZEPAM 5 MG PO TABS: 5.0000 mg | ORAL_TABLET | Freq: Three times a day (TID) | ORAL | 0 refills | Status: DC | PRN

## 2017-08-21 MED ORDER — KETOROLAC TROMETHAMINE 30 MG/ML IJ SOLN
30.0000 mg | Freq: Once | INTRAMUSCULAR | Status: AC
  Administered 2017-08-21: 30 mg via INTRAVENOUS
  Filled 2017-08-21: qty 1

## 2017-08-21 MED ORDER — DIAZEPAM 5 MG PO TABS
5.0000 mg | ORAL_TABLET | Freq: Once | ORAL | Status: AC
  Administered 2017-08-21: 5 mg via ORAL
  Filled 2017-08-21: qty 1

## 2017-08-21 NOTE — ED Triage Notes (Signed)
Pt seen here on Sunday for pelvic pain, pt states last Friday she was starting treatment for UTI, but told her that she did not end of having a UTI.  Pt dx with fibroid cyst also on Sunday, pt continues to c/o pelvic pain and states the flank pain started yesterday, radiating to the left side of the abdomen. Pt also states she has some pain on the right flank.  Pt taking Percocet without relief.  Pt very tearful in triage.

## 2017-08-21 NOTE — ED Provider Notes (Signed)
[facility] Emergency Department Provider Note       Time seen: ----------------------------------------- 8:24 AM on 08/21/2017 -----------------------------------------   I have reviewed the triage vital signs and the nursing notes.  HISTORY   Chief Complaint Flank Pain and Pelvic Pain    HPI Kelsey Alvarez is a 47 y.o. female with a history of asthma, diabetes, edema, GERD, hyperlipidemia and hypertension with chronic low back pain who presents to the ED for back pain.  Patient is tearful complaining of back pain, was seen here Sunday by me more for pelvic pain.  She recently had been treated for a UTI but when I saw her she does not have a urinary tract infection.  She did have a fibroid uterus on evaluation several days ago.  Currently she is only having back pain that radiates around to the front.  She denies fevers, chills or other complaints.  Past Medical History:  Diagnosis Date  . Abnormal CBC   . Allergy   . Arthritis    left knee  . Asthma   . Cervical polyp   . Chronic foot pain   . Complication of anesthesia    takes longer to wake up.  . Contact dermatitis   . Diabetes mellitus without complication ([place])   . Dysmenorrhea   . Edema   . 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 GERD (gastroesophageal reflux disease)   . Headache    history of migraines  . Hyperlipidemia   . Hypertension   . Lumbago   . Neuropathy   . Obesity   . Occasional tremors     Patient Active Problem List   Diagnosis Date Noted  . B12 deficiency 04/27/2016  . History of endometriosis 11/17/2015  . Menorrhagia with regular cycle 10/12/2015  . Benign essential HTN 12/28/2014  . Cervical polyp 12/28/2014  . Controlled type 2 diabetes mellitus with microalbuminuria ([place]) 12/28/2014  . Dyslipidemia 12/28/2014  . Dysmenorrhea 12/28/2014  . Edema 12/28/2014  . Female infertility 12/28/2014  . Gastro-esophageal reflux disease without esophagitis 12/28/2014  .  Morbid obesity ([place]) 12/28/2014  . Asthma, moderate persistent, poorly-controlled 12/28/2014  . Perennial allergic rhinitis 12/28/2014  . Intermittent tremor 12/28/2014  . Vitamin D deficiency 12/28/2014  . LBP (low back pain) 12/28/2014  . Left sciatic nerve pain 12/28/2014    Past Surgical History:  Procedure Laterality Date  . APPENDECTOMY    . cervical polyp removal    . DILATION AND CURETTAGE OF UTERUS    . HYSTEROSCOPY    . HYSTEROSCOPY W/D&C N/A 10/12/2015   Procedure: DILATATION AND CURETTAGE /HYSTEROSCOPY;  Surgeon: Will Bonnet, MD;  Location: [facility] ORS;  Service: Gynecology;  Laterality: N/A;  . LAPAROSCOPIC BILATERAL SALPINGO OOPHERECTOMY N/A 10/12/2015   Procedure: LAPAROSCOPIC RIGHT SALPINGECTOMY, LEFT OOPHORECTOMY;  Surgeon: Will Bonnet, MD;  Location: [facility] OR[facility];  Service: Gynecology;  Laterality: N/A;  . LAPAROSCOPIC OVARIAN CYSTECTOMY Bilateral 10/12/2015   Procedure: LAPAROSCOPIC OVARIAN CYSTECTOMY;  Surgeon: Will Bonnet, MD;  Location: [facility] OR[facility];  Service: Gynecology;  Laterality: Bilateral;  . OOPHORECTOMY    . OVARIAN CYST REMOVAL      Allergies Fish allergy; Penicillins; and Adhesive [tape]  Social History Social History   Tobacco Use  . Smoking status: Former Smoker    Years: 1.00    Types: Cigarettes    Start date: 12/03/2001    Last attempt to quit: 12/28/2002    Years since quitting: 14.6  . Smokeless tobacco: Never Used  . Tobacco  comment: smoked 3 to 4 cigarrettes a day for a year  Substance Use Topics  . Alcohol use: Yes    Alcohol/week: 0.0 oz    Comment: occasionally drinks margarita, once or twice a year  . Drug use: No   Review of Systems Constitutional: Negative for fever. Cardiovascular: Negative for chest pain. Respiratory: Negative for shortness of breath. Gastrointestinal: Negative for any current abdominal pain, positive for nausea Genitourinary: Negative for dysuria. Musculoskeletal: Positive for back pain Skin:  Negative for rash. Neurological: Negative for headaches, focal weakness or numbness.  All systems negative/normal/unremarkable except as stated in the HPI  ____________________________________________   PHYSICAL EXAM:  VITAL SIGNS: ED Triage Vitals  Enc Vitals Group     BP 08/21/17 0806 (!) 157/48     Pulse Rate 08/21/17 0806 75     Resp 08/21/17 0806 20     Temp 08/21/17 0806 97.7 F (36.5 C)     Temp Source 08/21/17 0806 Oral     SpO2 08/21/17 0806 98 %     Weight 08/21/17 0813 265 lb (120.2 kg)     Height 08/21/17 0813 5\' 1"  (1.549 m)     Head Circumference --      Peak Flow --      Pain Score 08/21/17 0813 9     Pain Loc --      Pain Edu? --      Excl. in [place]? --    Constitutional: Alert and oriented.  Mild distress Eyes: Conjunctivae are normal. Normal extraocular movements. Cardiovascular: Normal rate, regular rhythm. No murmurs, rubs, or gallops. Respiratory: Normal respiratory effort without tachypnea nor retractions. Breath sounds are clear and equal bilaterally. No wheezes/rales/rhonchi. Gastrointestinal: Soft and nontender. Normal bowel sounds Musculoskeletal: Nontender with normal range of motion in extremities. No lower extremity tenderness nor edema.  Nonfocal tenderness in lumbosacral region Neurologic:  Normal speech and language. No gross focal neurologic deficits are appreciated.  Skin:  Skin is warm, dry and intact. No rash noted. Psychiatric: Mood and affect are normal. Speech and behavior are normal.  ____________________________________________  ED COURSE:  As part of my medical decision making, I reviewed the following data within the [place] History obtained from family if available, nursing notes, old chart and ekg, as well as notes from prior ED visits. Patient presented for back pain, we will assess with labs and imaging as indicated at this time.   Procedures ____________________________________________   LABS (pertinent  positives/negatives)  Labs Reviewed  CBC WITH DIFFERENTIAL/PLATELET - Abnormal; Notable for the following components:      Result Value   MCV 79.2 (*)    All other components within normal limits  COMPREHENSIVE METABOLIC PANEL - Abnormal; Notable for the following components:   Glucose, Bld 120 (*)    Calcium 8.3 (*)    All other components within normal limits  URINALYSIS, COMPLETE (UACMP) WITH MICROSCOPIC - Abnormal; Notable for the following components:   Color, Urine YELLOW (*)    APPearance CLOUDY (*)    Bacteria, UA FEW (*)    All other components within normal limits  LIPASE, BLOOD    RADIOLOGY Images were viewed by me  CT renal protocol IMPRESSION: 1. Dominant uterine leiomyoma, seen on recent ultrasound felt to be stable.  2.  Hepatic steatosis.  3. No renal or ureteral calculus. No hydronephrosis. Urinary bladder wall thickness normal.  4.  No evident bowel obstruction.  No abscess.  Appendix absent.  ____________________________________________  DIFFERENTIAL DIAGNOSIS  Muscle strain, degenerative disc disease, renal colic, pyelonephritis  FINAL ASSESSMENT AND PLAN  Low back pain   Plan: The patient had presented for back pain. Patient's labs did not reveal any acute process. Patient's imaging was also negative.  No clear etiology for pain at this time.  She will be discharged with close outpatient follow-up.   Laurence Aly, MD   Note: This note was generated in part or whole with voice recognition software. Voice recognition is usually quite accurate but there are transcription errors that can and very often do occur. I apologize for any typographical errors that were not detected and corrected.     Earleen Newport, MD 08/21/17 1102

## 2017-08-21 NOTE — ED Notes (Signed)
First Nurse Note:  Patient tearful complaining of back pain.  Declines WC.  States she was seen here on Sun. Alert and oriented.

## 2017-08-23 ENCOUNTER — Ambulatory Visit (INDEPENDENT_AMBULATORY_CARE_PROVIDER_SITE_OTHER): Payer: BLUE CROSS/BLUE SHIELD | Admitting: Family Medicine

## 2017-08-23 ENCOUNTER — Encounter: Payer: Self-pay | Admitting: Family Medicine

## 2017-08-23 VITALS — BP 140/90 | HR 87 | Resp 16 | Ht 61.0 in | Wt 269.9 lb

## 2017-08-23 DIAGNOSIS — M5137 Other intervertebral disc degeneration, lumbosacral region: Secondary | ICD-10-CM | POA: Diagnosis not present

## 2017-08-23 DIAGNOSIS — M5410 Radiculopathy, site unspecified: Secondary | ICD-10-CM

## 2017-08-23 MED ORDER — PREDNISONE 10 MG (48) PO TBPK: ORAL_TABLET | ORAL | 0 refills | Status: DC

## 2017-08-23 NOTE — Patient Instructions (Signed)
Sciatica Sciatica is pain, numbness, weakness, or tingling along the path of the sciatic nerve. The sciatic nerve starts in the lower back and runs down the back of each leg. The nerve controls the muscles in the lower leg and in the back of the knee. It also provides feeling (sensation) to the back of the thigh, the lower leg, and the sole of the foot. Sciatica is a symptom of another medical condition that pinches or puts pressure on the sciatic nerve. Generally, sciatica only affects one side of the body. Sciatica usually goes away on its own or with treatment. In some cases, sciatica may keep coming back (recur). What are the causes? This condition is caused by pressure on the sciatic nerve, or pinching of the sciatic nerve. This may be the result of:  A disk in between the bones of the spine (vertebrae) bulging out too far (herniated disk).  Age-related changes in the spinal disks (degenerative disk disease).  A pain disorder that affects a muscle in the buttock (piriformis syndrome).  Extra bone growth (bone spur) near the sciatic nerve.  An injury or break (fracture) of the pelvis.  Pregnancy.  Tumor (rare).  What increases the risk? The following factors may make you more likely to develop this condition:  Playing sports that place pressure or stress on the spine, such as football or weight lifting.  Having poor strength and flexibility.  A history of back injury.  A history of back surgery.  Sitting for long periods of time.  Doing activities that involve repetitive bending or lifting.  Obesity.  What are the signs or symptoms? Symptoms can vary from mild to very severe, and they may include:  Any of these problems in the lower back, leg, hip, or buttock: ? Mild tingling or dull aches. ? Burning sensations. ? Sharp pains.  Numbness in the back of the calf or the sole of the foot.  Leg weakness.  Severe back pain that makes movement difficult.  These  symptoms may get worse when you cough, sneeze, or laugh, or when you sit or stand for long periods of time. Being overweight may also make symptoms worse. In some cases, symptoms may recur over time. How is this diagnosed? This condition may be diagnosed based on:  Your symptoms.  A physical exam. Your health care provider may ask you to do certain movements to check whether those movements trigger your symptoms.  You may have tests, including: ? Blood tests. ? X-rays. ? MRI. ? CT scan.  How is this treated? In many cases, this condition improves on its own, without any treatment. However, treatment may include:  Reducing or modifying physical activity during periods of pain.  Exercising and stretching to strengthen your abdomen and improve the flexibility of your spine.  Icing and applying heat to the affected area.  Medicines that help: ? To relieve pain and swelling. ? To relax your muscles.  Injections of medicines that help to relieve pain, irritation, and inflammation around the sciatic nerve (steroids).  Surgery.  Follow these instructions at home: Medicines  Take over-the-counter and prescription medicines only as told by your health care provider.  Do not drive or operate heavy machinery while taking prescription pain medicine. Managing pain  If directed, apply ice to the affected area. ? Put ice in a plastic bag. ? Place a towel between your skin and the bag. ? Leave the ice on for 20 minutes, 2-3 times a day.  After icing, apply   heat to the affected area before you exercise or as often as told by your health care provider. Use the heat source that your health care provider recommends, such as a moist heat pack or a heating pad. ? Place a towel between your skin and the heat source. ? Leave the heat on for 20-30 minutes. ? Remove the heat if your skin turns bright red. This is especially important if you are unable to feel pain, heat, or cold. You may have a  greater risk of getting burned. Activity  Return to your normal activities as told by your health care provider. Ask your health care provider what activities are safe for you. ? Avoid activities that make your symptoms worse.  Take brief periods of rest throughout the day. Resting in a lying or standing position is usually better than sitting to rest. ? When you rest for longer periods, mix in some mild activity or stretching between periods of rest. This will help to prevent stiffness and pain. ? Avoid sitting for long periods of time without moving. Get up and move around at least one time each hour.  Exercise and stretch regularly, as told by your health care provider.  Do not lift anything that is heavier than 10 lb (4.5 kg) while you have symptoms of sciatica. When you do not have symptoms, you should still avoid heavy lifting, especially repetitive heavy lifting.  When you lift objects, always use proper lifting technique, which includes: ? Bending your knees. ? Keeping the load close to your body. ? Avoiding twisting. General instructions  Use good posture. ? Avoid leaning forward while sitting. ? Avoid hunching over while standing.  Maintain a healthy weight. Excess weight puts extra stress on your back and makes it difficult to maintain good posture.  Wear supportive, comfortable shoes. Avoid wearing high heels.  Avoid sleeping on a mattress that is too soft or too hard. A mattress that is firm enough to support your back when you sleep may help to reduce your pain.  Keep all follow-up visits as told by your health care provider. This is important. Contact a health care provider if:  You have pain that wakes you up when you are sleeping.  You have pain that gets worse when you lie down.  Your pain is worse than you have experienced in the past.  Your pain lasts longer than 4 weeks.  You experience unexplained weight loss. Get help right away if:  You lose control  of your bowel or bladder (incontinence).  You have: ? Weakness in your lower back, pelvis, buttocks, or legs that gets worse. ? Redness or swelling of your back. ? A burning sensation when you urinate. This information is not intended to replace advice given to you by your health care provider. Make sure you discuss any questions you have with your health care provider. Document Released: 02/13/2001 Document Revised: 07/26/2015 Document Reviewed: 10/29/2014 [employer] Interactive Patient Education  2018 [employer]  

## 2017-08-23 NOTE — Progress Notes (Signed)
Name: Kelsey Alvarez   MRN: [id number]    DOB: 19-Dec-1970   Date:08/23/2017       Progress Note  Subjective  Chief Complaint  Chief Complaint  Patient presents with  . Hospitalization Follow-up  . Form Completion    fmla forms need to be completed    HPI  EC visit follow up: she states symptoms started on June 11th, she states initially abdominal cramping that started 3 days after her cycle ended. She felt it was an UTI, went to Urgent Care on June 15 th and was given Septra, however pain got worse, started to radiate to her back, nausea, fatigue. 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 She was also advised to start ocp. She states unable to work on Tuesday and Wednesday - went back to [facility] had CT scan to look for stones and was negative. It showed DDD lumbar spine. She has a history of sciatica and pain had migrated to left lower back and left buttocks. She states pain slightly better now since she was given and anti-inflammatory medication. 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 I will give her an excuse for work.  Patient Active Problem List   Diagnosis Date Noted  . B12 deficiency 04/27/2016  . History of endometriosis 11/17/2015  . Menorrhagia with regular cycle 10/12/2015  . Benign essential HTN 12/28/2014  . Cervical polyp 12/28/2014  . Controlled type 2 diabetes mellitus with microalbuminuria ([place]) 12/28/2014  . Dyslipidemia 12/28/2014  . Dysmenorrhea 12/28/2014  . Edema 12/28/2014  . Female infertility 12/28/2014  . Gastro-esophageal reflux disease without esophagitis 12/28/2014  . Morbid obesity ([place]) 12/28/2014  . Asthma, moderate persistent, poorly-controlled 12/28/2014  . Perennial allergic rhinitis 12/28/2014  . Intermittent tremor 12/28/2014  . Vitamin D deficiency 12/28/2014  . LBP (low back  pain) 12/28/2014  . Left sciatic nerve pain 12/28/2014    Past Surgical History:  Procedure Laterality Date  . APPENDECTOMY    . cervical polyp removal    . DILATION AND CURETTAGE OF UTERUS    . HYSTEROSCOPY    . HYSTEROSCOPY W/D&C N/A 10/12/2015   Procedure: DILATATION AND CURETTAGE /HYSTEROSCOPY;  Surgeon: Will Bonnet, MD;  Location: [facility] ORS;  Service: Gynecology;  Laterality: N/A;  . LAPAROSCOPIC BILATERAL SALPINGO OOPHERECTOMY N/A 10/12/2015   Procedure: LAPAROSCOPIC RIGHT SALPINGECTOMY, LEFT OOPHORECTOMY;  Surgeon: Will Bonnet, MD;  Location: [facility] ORS;  Service: Gynecology;  Laterality: N/A;  . LAPAROSCOPIC OVARIAN CYSTECTOMY Bilateral 10/12/2015   Procedure: LAPAROSCOPIC OVARIAN CYSTECTOMY;  Surgeon: Will Bonnet, MD;  Location: [facility] ORS;  Service: Gynecology;  Laterality: Bilateral;  . OOPHORECTOMY    . OVARIAN CYST REMOVAL      Family History  Problem Relation Age of Onset  . Hypertension Mother   . Diabetes Mother   . CAD Mother   . Cancer Mother        cervical  . Heart attack Mother 23       Triple Bypass  . CAD Father   . Hypertension Father   . Arthritis Father   . Thyroid disease Sister   . Alzheimer's disease Maternal Grandmother   . Alzheimer's disease Maternal Grandfather   . Breast cancer Paternal Grandmother     Social History   Socioeconomic History  . Marital status: Married  Spouse name: Not on file  . Number of children: Not on file  . Years of education: Not on file  . Highest education level: Not on file  Occupational History  . Occupation: Museum/gallery curator  Social Needs  . Financial resource strain: Not on file  . Food insecurity:    Worry: Not on file    Inability: Not on file  . Transportation needs:    Medical: Not on file    Non-medical: Not on file  Tobacco Use  . Smoking status: Former Smoker    Years: 1.00    Types: Cigarettes    Start date: 12/03/2001    Last attempt to quit: 12/28/2002    Years since quitting:  14.6  . Smokeless tobacco: Never Used  . Tobacco comment: smoked 3 to 4 cigarrettes a day for a year  Substance and Sexual Activity  . Alcohol use: Yes    Alcohol/week: 0.0 oz    Comment: occasionally drinks margarita, once or twice a year  . Drug use: No  . Sexual activity: Yes    Partners: Male    Comment: Partial Hysterectomy  Lifestyle  . Physical activity:    Days per week: Not on file    Minutes per session: Not on file  . Stress: Not on file  Relationships  . Social connections:    Talks on phone: Not on file    Gets together: Not on file    Attends religious service: Not on file    Active member of club or organization: Not on file    Attends meetings of clubs or organizations: Not on file    Relationship status: Not on file  . Intimate partner violence:    Fear of current or ex partner: Not on file    Emotionally abused: Not on file    Physically abused: Not on file    Forced sexual activity: Not on file  Other Topics Concern  . Not on file  Social History Narrative  . Not on file     Current Outpatient Medications:  .  aspirin EC 81 MG tablet, Take 1 tablet (81 mg total) by mouth daily., Disp: 30 tablet, Rfl: 0 .  atorvastatin (LIPITOR) 40 MG tablet, Take 1 tablet (40 mg total) by mouth daily., Disp: 90 tablet, Rfl: 1 .  budesonide-formoterol (SYMBICORT) 160-4.5 MCG/ACT inhaler, Inhale 2 puffs into the lungs 2 (two) times daily., Disp: 1 Inhaler, Rfl: 2 .  Cholecalciferol (VITAMIN D) 2000 UNITS tablet, Take 1 tablet by mouth daily., Disp: , Rfl:  .  diazepam (VALIUM) 5 MG tablet, Take 1 tablet (5 mg total) by mouth every 8 (eight) hours as needed for muscle spasms., Disp: 20 tablet, Rfl: 0 .  dicyclomine (BENTYL) 20 MG tablet, Take 1 tablet (20 mg total) by mouth 3 (three) times daily as needed (abdominal cramping). Take before meals, Disp: 30 tablet, Rfl: 0 .  fluticasone (FLONASE) 50 MCG/ACT nasal spray, Place 2 sprays into both nostrils daily., Disp: 16 g, Rfl:  5 .  gabapentin (NEURONTIN) 300 MG capsule, Take 1 capsule (300 mg total) by mouth 3 (three) times daily. (Patient taking differently: Take 300 mg by mouth daily. ), Disp: 90 capsule, Rfl: 2 .  ipratropium-albuterol (DUONEB) 0.5-2.5 (3) MG/3ML SOLN, Take 3 mLs every 4 (four) hours as needed by nebulization., Disp: 360 mL, Rfl: 0 .  levocetirizine (XYZAL) 5 MG tablet, Take 5 mg by mouth every evening., Disp: , Rfl:  .  montelukast (SINGULAIR) 10  MG tablet, Take 1 tablet (10 mg total) by mouth at bedtime., Disp: 90 tablet, Rfl: 1 .  norethindrone-ethinyl estradiol-iron (ESTROSTEP FE,TILIA FE,TRI-LEGEST FE) 1-20/1-30/1-35 MG-MCG tablet, Take 1 tablet by mouth daily., Disp: 1 Package, Rfl: 11 .  olmesartan-hydrochlorothiazide (BENICAR HCT) 20-12.5 MG tablet, Take 1 tablet by mouth daily., Disp: , Rfl: 2 .  omeprazole (PRILOSEC) 10 MG capsule, Take 1 capsule (10 mg total) by mouth daily., Disp: 30 capsule, Rfl: 0 .  PROAIR HFA 108 (90 Base) MCG/ACT inhaler, INHALE 2 PUFFS BY MOUTH EVERY 6 HOURS AS NEEDED FOR WHEEZING OR  SHORTNESS  OF  BREATH, Disp: 9 each, Rfl: 0 .  Semaglutide (OZEMPIC) 0.25 or 0.5 MG/DOSE SOPN, Inject 0.5 mg into the skin once a week., Disp: 2 pen, Rfl: 2 .  methocarbamol (ROBAXIN) 500 MG tablet, Take 1 tablet (500 mg total) by mouth 4 (four) times daily. (Patient not taking: Reported on 08/23/2017), Disp: 16 tablet, Rfl: 0 .  predniSONE (STERAPRED UNI-PAK 48 TAB) 10 MG (48) TBPK tablet, Take as directed with food, Disp: 48 tablet, Rfl: 0  Allergies  Allergen Reactions  . Fish Allergy Shortness Of Breath  . Penicillins Anaphylaxis and Rash    Childhood allergy  . Adhesive [Tape] Rash    Paper tape ok to use.     ROS  Ten systems reviewed and is negative except as mentioned in HPI   Objective  Vitals:   08/23/17 1115 08/23/17 1119  BP: (!) 164/92 140/90  Pulse: 87   Resp: 16   SpO2: 98%   Weight: 269 lb 14.4 oz (122.4 kg)   Height: '5\' 1"'$  (1.549 m)     Body mass  index is 51 kg/m.  Physical Exam  Constitutional: Patient appears well-developed and well-nourished. Obese No distress.  HEENT: head atraumatic, normocephalic, pupils equal and reactive to light,  neck supple, throat within normal limits Cardiovascular: Normal rate, regular rhythm and normal heart sounds.  No murmur heard. No BLE edema. Pulmonary/Chest: Effort normal and breath sounds normal. No respiratory distress. Abdominal: Soft.  There is no tenderness. Psychiatric: Patient has a normal mood and affect. behavior is normal. Judgment and thought content normal. Muscular Skeletal: pain during palpation of lumbar spine, positive straight leg raise left leg.   Recent Results (from the past 2160 hour(s))  POCT HgB A1C     Status: Normal   Collection Time: 07/17/17 11:36 AM  Result Value Ref Range   Hemoglobin A1C 6.3   Glucose, capillary     Status: Abnormal   Collection Time: 08/18/17  4:13 PM  Result Value Ref Range   Glucose-Capillary 217 (H) 65 - 99 mg/dL  Basic metabolic panel     Status: Abnormal   Collection Time: 08/18/17  4:15 PM  Result Value Ref Range   Sodium 133 (L) 135 - 145 mmol/L   Potassium 3.7 3.5 - 5.1 mmol/L   Chloride 98 (L) 101 - 111 mmol/L   CO2 24 22 - 32 mmol/L   Glucose, Bld 221 (H) 65 - 99 mg/dL   BUN 9 6 - 20 mg/dL   Creatinine, Ser 0.80 0.44 - 1.00 mg/dL   Calcium 8.6 (L) 8.9 - 10.3 mg/dL   GFR calc non Af Amer >60 >60 mL/min   GFR calc Af Amer >60 >60 mL/min    Comment: (NOTE) The eGFR has been calculated using the CKD EPI equation. This calculation has not been validated in all clinical situations. eGFR's persistently <60 mL/min signify possible Chronic Kidney  Disease.    Anion gap 11 5 - 15    Comment: Performed at [facility], [address] [place], [place] [address]  CBC     Status: Abnormal   Collection Time: 08/18/17  4:15 PM  Result Value Ref Range   WBC 9.6 3.6 - 11.0 K/uL   RBC 5.16 3.80 - 5.20 MIL/uL   Hemoglobin 14.0  12.0 - 16.0 g/dL   HCT 41.1 35.0 - 47.0 %   MCV 79.5 (L) 80.0 - 100.0 fL   MCH 27.2 26.0 - 34.0 pg   MCHC 34.2 32.0 - 36.0 g/dL   RDW 14.1 11.5 - 14.5 %   Platelets 245 150 - 440 K/uL    Comment: Performed at [facility], [address] [place], [place] [address]  Urinalysis, Complete w Microscopic     Status: Abnormal   Collection Time: 08/18/17  4:15 PM  Result Value Ref Range   Color, Urine YELLOW (A) YELLOW   APPearance CLEAR (A) CLEAR   Specific Gravity, Urine 1.018 1.005 - 1.030   pH 5.0 5.0 - 8.0   Glucose, UA NEGATIVE NEGATIVE mg/dL   Hgb urine dipstick NEGATIVE NEGATIVE   Bilirubin Urine NEGATIVE NEGATIVE   Ketones, ur NEGATIVE NEGATIVE mg/dL   Protein, ur NEGATIVE NEGATIVE mg/dL   Nitrite NEGATIVE NEGATIVE   Leukocytes, UA NEGATIVE NEGATIVE   RBC / HPF 0-5 0 - 5 RBC/hpf   WBC, UA 0-5 0 - 5 WBC/hpf   Bacteria, UA RARE (A) NONE SEEN   Squamous Epithelial / LPF 6-10 0 - 5   Mucus PRESENT    Hyaline Casts, UA PRESENT     Comment: Performed at [facility], [address] [place], [place] [address]  Pregnancy, urine     Status: None   Collection Time: 08/18/17  4:15 PM  Result Value Ref Range   Preg Test, Ur NEGATIVE NEGATIVE    Comment: Performed at [facility], [address] [place], [place] [address]  CBC with Differential     Status: Abnormal   Collection Time: 08/21/17  9:09 AM  Result Value Ref Range   WBC 7.3 3.6 - 11.0 K/uL   RBC 4.89 3.80 - 5.20 MIL/uL   Hemoglobin 13.1 12.0 - 16.0 g/dL   HCT 38.7 35.0 - 47.0 %   MCV 79.2 (L) 80.0 - 100.0 fL   MCH 26.8 26.0 - 34.0 pg   MCHC 33.9 32.0 - 36.0 g/dL   RDW 14.4 11.5 - 14.5 %   Platelets 272 150 - 440 K/uL   Neutrophils Relative % 58 %   Neutro Abs 4.3 1.4 - 6.5 K/uL   Lymphocytes Relative 32 %   Lymphs Abs 2.3 1.0 - 3.6 K/uL   Monocytes Relative 7 %   Monocytes Absolute 0.5 0.2 - 0.9 K/uL   Eosinophils Relative 2 %   Eosinophils Absolute 0.1 0 - 0.7 K/uL   Basophils  Relative 1 %   Basophils Absolute 0.1 0 - 0.1 K/uL    Comment: Performed at [facility], [address] [place], [place] [address]  Comprehensive metabolic panel     Status: Abnormal   Collection Time: 08/21/17  9:09 AM  Result Value Ref Range   Sodium 135 135 - 145 mmol/L   Potassium 3.8 3.5 - 5.1 mmol/L   Chloride 103 101 - 111 mmol/L   CO2 23 22 - 32 mmol/L   Glucose, Bld 120 (H) 65 - 99 mg/dL  BUN 10 6 - 20 mg/dL   Creatinine, Ser 0.69 0.44 - 1.00 mg/dL   Calcium 8.3 (L) 8.9 - 10.3 mg/dL   Total Protein 6.9 6.5 - 8.1 g/dL   Albumin 3.6 3.5 - 5.0 g/dL   AST 21 15 - 41 U/L   ALT 14 14 - 54 U/L   Alkaline Phosphatase 49 38 - 126 U/L   Total Bilirubin 0.5 0.3 - 1.2 mg/dL   GFR calc non Af Amer >60 >60 mL/min   GFR calc Af Amer >60 >60 mL/min    Comment: (NOTE) The eGFR has been calculated using the CKD EPI equation. This calculation has not been validated in all clinical situations. eGFR's persistently <60 mL/min signify possible Chronic Kidney Disease.    Anion gap 9 5 - 15    Comment: Performed at [facility], [address] [place], [place] [address]  Lipase, blood     Status: None   Collection Time: 08/21/17  9:09 AM  Result Value Ref Range   Lipase 28 11 - 51 U/L    Comment: Performed at [facility], [address] [place], [place] [address]  Urinalysis, Complete w Microscopic     Status: Abnormal   Collection Time: 08/21/17  9:10 AM  Result Value Ref Range   Color, Urine YELLOW (A) YELLOW   APPearance CLOUDY (A) CLEAR   Specific Gravity, Urine 1.017 1.005 - 1.030   pH 5.0 5.0 - 8.0   Glucose, UA NEGATIVE NEGATIVE mg/dL   Hgb urine dipstick NEGATIVE NEGATIVE   Bilirubin Urine NEGATIVE NEGATIVE   Ketones, ur NEGATIVE NEGATIVE mg/dL   Protein, ur NEGATIVE NEGATIVE mg/dL   Nitrite NEGATIVE NEGATIVE   Leukocytes, UA NEGATIVE NEGATIVE   RBC / HPF 0-5 0 - 5 RBC/hpf   WBC, UA 0-5 0 - 5 WBC/hpf   Bacteria, UA FEW (A) NONE SEEN    Squamous Epithelial / LPF 11-20 0 - 5   Mucus PRESENT     Comment: Performed at [facility], [address] [place], [place] [address]     PHQ2/9: Depression screen [facility] 2/9 07/17/2017 10/09/2016 06/26/2016 04/27/2016 03/27/2016  Decreased Interest 0 0 0 0 0  Down, Depressed, Hopeless 0 0 0 0 0  PHQ - 2 Score 0 0 0 0 0     Fall Risk: Fall Risk  08/23/2017 07/17/2017 04/23/2017 02/28/2017 01/09/2017  Falls in the past year? No No No No No     Functional Status Survey: Is the patient deaf or have difficulty hearing?: No Does the patient have difficulty seeing, even when wearing glasses/contacts?: No Does the patient have difficulty concentrating, remembering, or making decisions?: No Does the patient have difficulty walking or climbing stairs?: No Does the patient have difficulty dressing or bathing?: No Does the patient have difficulty doing errands alone such as visiting a doctor's office or shopping?: No    Assessment & Plan  1. Acute low back pain with radicular symptoms, duration less than 6 weeks  - predniSONE (STERAPRED UNI-PAK 48 TAB) 10 MG (48) TBPK tablet; Take as directed with food  Dispense: 48 tablet; Refill: 0  Likely the cause of her symptoms, we will try prednisone pack, discussed possible side effects, take with food and not before bed time.    2. DDD (degenerative disc disease), lumbosacral  - predniSONE (STERAPRED UNI-PAK 48 TAB) 10 MG (48) TBPK tablet; Take as directed with food  Dispense: 48 tablet; Refill: 0

## 2017-09-10 ENCOUNTER — Telehealth: Payer: Self-pay | Admitting: Family Medicine

## 2017-09-10 NOTE — Telephone Encounter (Signed)
Called [phone] left voice message informing paperwork has been faxed and her copy will be upfront.

## 2017-09-10 NOTE — Telephone Encounter (Signed)
Question 6 and 7 was filled out with the numerical values and faxed. Please inform patient it was faxed again and her copy is upfront. Thanks.

## 2017-09-10 NOTE — Telephone Encounter (Signed)
Patient is asking when will the paper work be done that she dropped off a week ago.Told her that if she has not received a call then it might not be ready. Checked up front and did not find anything. Please advise the status.

## 2017-09-21 ENCOUNTER — Other Ambulatory Visit: Payer: Self-pay | Admitting: Family Medicine

## 2017-09-21 DIAGNOSIS — I10 Essential (primary) hypertension: Secondary | ICD-10-CM

## 2017-10-07 ENCOUNTER — Other Ambulatory Visit: Payer: Self-pay | Admitting: Family Medicine

## 2017-10-07 DIAGNOSIS — J4540 Moderate persistent asthma, uncomplicated: Secondary | ICD-10-CM

## 2017-10-17 ENCOUNTER — Encounter: Payer: Self-pay | Admitting: Family Medicine

## 2017-10-17 ENCOUNTER — Ambulatory Visit (INDEPENDENT_AMBULATORY_CARE_PROVIDER_SITE_OTHER): Payer: BLUE CROSS/BLUE SHIELD | Admitting: Family Medicine

## 2017-10-17 VITALS — BP 138/86 | HR 70 | Temp 98.3°F | Resp 16 | Ht 61.0 in | Wt 275.1 lb

## 2017-10-17 DIAGNOSIS — D259 Leiomyoma of uterus, unspecified: Secondary | ICD-10-CM

## 2017-10-17 DIAGNOSIS — E1169 Type 2 diabetes mellitus with other specified complication: Secondary | ICD-10-CM | POA: Diagnosis not present

## 2017-10-17 DIAGNOSIS — J4540 Moderate persistent asthma, uncomplicated: Secondary | ICD-10-CM

## 2017-10-17 DIAGNOSIS — E1140 Type 2 diabetes mellitus with diabetic neuropathy, unspecified: Secondary | ICD-10-CM

## 2017-10-17 DIAGNOSIS — I10 Essential (primary) hypertension: Secondary | ICD-10-CM

## 2017-10-17 DIAGNOSIS — J3089 Other allergic rhinitis: Secondary | ICD-10-CM | POA: Diagnosis not present

## 2017-10-17 DIAGNOSIS — E785 Hyperlipidemia, unspecified: Secondary | ICD-10-CM

## 2017-10-17 DIAGNOSIS — R809 Proteinuria, unspecified: Secondary | ICD-10-CM

## 2017-10-17 DIAGNOSIS — E1129 Type 2 diabetes mellitus with other diabetic kidney complication: Secondary | ICD-10-CM

## 2017-10-17 DIAGNOSIS — G5603 Carpal tunnel syndrome, bilateral upper limbs: Secondary | ICD-10-CM

## 2017-10-17 DIAGNOSIS — M5137 Other intervertebral disc degeneration, lumbosacral region: Secondary | ICD-10-CM

## 2017-10-17 LAB — POCT UA - MICROALBUMIN: MICROALBUMIN (UR) POC: 20 mg/L

## 2017-10-17 LAB — POCT GLYCOSYLATED HEMOGLOBIN (HGB A1C): HEMOGLOBIN A1C: 6.9 % — AB (ref 4.0–5.6)

## 2017-10-17 MED ORDER — FLUTICASONE PROPIONATE 50 MCG/ACT NA SUSP: 2.0000 | Freq: Every day | NASAL | 5 refills | Status: AC

## 2017-10-17 MED ORDER — MONTELUKAST SODIUM 10 MG PO TABS: 10.0000 mg | ORAL_TABLET | Freq: Every day | ORAL | 1 refills | Status: DC

## 2017-10-17 MED ORDER — SEMAGLUTIDE (1 MG/DOSE) 2 MG/1.5ML ~~LOC~~ SOPN: 1.0000 mg | PEN_INJECTOR | SUBCUTANEOUS | 2 refills | Status: DC

## 2017-10-17 MED ORDER — PROAIR HFA 108 (90 BASE) MCG/ACT IN AERS: INHALATION_SPRAY | RESPIRATORY_TRACT | 0 refills | Status: DC

## 2017-10-17 MED ORDER — OLMESARTAN MEDOXOMIL-HCTZ 20-12.5 MG PO TABS: 1.0000 | ORAL_TABLET | Freq: Every day | ORAL | 0 refills | Status: DC

## 2017-10-17 MED ORDER — BUDESONIDE-FORMOTEROL FUMARATE 160-4.5 MCG/ACT IN AERO: 2.0000 | INHALATION_SPRAY | Freq: Two times a day (BID) | RESPIRATORY_TRACT | 2 refills | Status: DC

## 2017-10-17 NOTE — Progress Notes (Signed)
Name: Kelsey Alvarez   MRN: [id number]    DOB: [birth year]   Date:10/17/2017       Progress Note  Subjective  Chief Complaint  Chief Complaint  Patient presents with  . Follow-up    3 mth f/u  . Asthma  . Diabetes    checks weekly, Average-115 Highest-148  . Hyperlipidemia  . Hypertension    HPI  [id number] has been at goal, she has been out of Ozempic, states glucose has been going up to 140's.  Shedeniespolyphagia, polydipsia and polyuria. She  tried Metformin iin the past but it caused indigestion and diarrhea.She has proteinuria and is on ARB, last level was normal .She is still having tingling on her handsand now also having on both feet, she is getting monthly B12 injections, so likely secondary  diabetic neuropathy, Lyric did not help with symptoms and she had to go to [facility] on 04/22 with severe hands and feet pain, she was switched to gabapentin and is now seeing Dr. Manuella Ghazi who ordered EMG and diagnosed her with bilateral carpal tunnel and also to continue B12 supplementation.   Low back pain with radiculitis: went to [facility] back in 08/2017, after that referred to Neurologist and had EMG that showed neuropathy, being treated with gabapentin, she has not been as active because of pain, current pain level is 6/10. Discussed referral to pain clinic.   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 Using symbicort daily but has noticed increase use of rescue inhaler and needs a refill. Last fill was 07/2017  Hyperlipidemia: taking Atorvastatin  occasionally, she states it causes headache, advised to try taking half pill and let me know if she needs me to sent new dose to pharmacy. No chest pain or palpitation. Needs to follow a healthier diet  HTN: continue medication, no chest pain, dizziness  or palpitation.No side effects of medication. Compliant with medication   Fibroid tumors: she has heavy bleeding during cycles, could not tolerate ocp, she wants to go back to see Dr. Glennon Mac   Patient Active Problem List   Diagnosis Date Noted  . Carpal tunnel syndrome on both sides 10/17/2017  . B12 deficiency 04/27/2016  . History of endometriosis 11/17/2015  . Menorrhagia with regular cycle 10/12/2015  . Benign essential HTN 12/28/2014  . Cervical polyp 12/28/2014  . Controlled type 2 diabetes mellitus with microalbuminuria ([place]) 12/28/2014  . Dyslipidemia 12/28/2014  . Dysmenorrhea 12/28/2014  . Edema 12/28/2014  . Female infertility 12/28/2014  . Gastro-esophageal reflux disease without esophagitis 12/28/2014  . Morbid obesity ([place]) 12/28/2014  . Asthma, moderate persistent, poorly-controlled 12/28/2014  . Perennial allergic rhinitis 12/28/2014  . Intermittent tremor 12/28/2014  . Vitamin D deficiency 12/28/2014  . LBP (low back pain) 12/28/2014  . Left sciatic nerve pain 12/28/2014    Past Surgical History:  Procedure Laterality Date  . APPENDECTOMY    . cervical polyp removal    . DILATION AND CURETTAGE OF UTERUS    . HYSTEROSCOPY    . HYSTEROSCOPY W/D&C N/A 10/12/2015   Procedure: DILATATION AND CURETTAGE /HYSTEROSCOPY;  Surgeon: Will Bonnet, MD;  Location: [facility] ORS;  Service: Gynecology;  Laterality: N/A;  . LAPAROSCOPIC BILATERAL SALPINGO OOPHERECTOMY N/A 10/12/2015   Procedure: LAPAROSCOPIC RIGHT SALPINGECTOMY, LEFT OOPHORECTOMY;  Surgeon: Will Bonnet, MD;  Location: [facility] ORS;  Service: Gynecology;  Laterality: N/A;  . LAPAROSCOPIC OVARIAN CYSTECTOMY Bilateral 10/12/2015   Procedure: LAPAROSCOPIC OVARIAN  CYSTECTOMY;  Surgeon: Will Bonnet, MD;  Location: [facility] ORS;  Service: Gynecology;  Laterality: Bilateral;  . OOPHORECTOMY    . OVARIAN CYST REMOVAL      Family History  Problem Relation Age of Onset  . Hypertension Mother   . Diabetes Mother   . CAD Mother   . Cancer Mother        cervical  . Heart attack Mother 48       Triple Bypass  . CAD Father   . Hypertension Father   . Arthritis Father   . Thyroid disease Sister   . Alzheimer's disease Maternal Grandmother   . Alzheimer's disease Maternal Grandfather   . Breast cancer Paternal Grandmother     Social History   Socioeconomic History  . Marital status: Married    Spouse name: Not on file  . Number of children: Not on file  . Years of education: Not on file  . Highest education level: Not on file  Occupational History  . Occupation: Museum/gallery curator  Social Needs  . Financial resource strain: Not on file  . Food insecurity:    Worry: Not on file    Inability: Not on file  . Transportation needs:    Medical: Not on file    Non-medical: Not on file  Tobacco Use  . Smoking status: Former Smoker    Years: 1.00    Types: Cigarettes    Start date: 12/03/2001    Last attempt to quit: 12/28/2002    Years since quitting: 14.8  . Smokeless tobacco: Never Used  . Tobacco comment: smoked 3 to 4 cigarrettes a day for a year  Substance and Sexual Activity  . Alcohol use: Yes    Alcohol/week: 0.0 standard drinks    Comment: occasionally drinks margarita, once or twice a year  . Drug use: No  . Sexual activity: Yes    Partners: Male    Comment: Partial Hysterectomy  Lifestyle  . Physical activity:    Days per week: Not on file    Minutes per session: Not on file  . Stress: Not on file  Relationships  . Social connections:    Talks on phone: Not on file    Gets together: Not on file    Attends religious service: Not on file    Active member of club or organization: Not on file    Attends meetings of clubs or  organizations: Not on file    Relationship status: Not on file  . Intimate partner violence:    Fear of current or ex partner: Not on file    Emotionally abused: Not on file    Physically abused: Not on file    Forced sexual activity: Not on file  Other Topics Concern  . Not on file  Social History Narrative  . Not on file     Current Outpatient Medications:  .  aspirin EC 81 MG tablet, Take 1 tablet (81 mg total) by mouth daily., Disp: 30 tablet, Rfl: 0 .  atorvastatin (LIPITOR) 40 MG tablet, Take 1 tablet (40 mg total) by mouth daily., Disp: 90 tablet, Rfl: 1 .  Cholecalciferol (VITAMIN D) 2000 UNITS tablet, Take 1 tablet by mouth daily., Disp: , Rfl:  .  gabapentin (NEURONTIN) 100 MG capsule, Take 1 capsule by mouth 2 (two) times daily., Disp: , Rfl:  .  gabapentin (NEURONTIN) 300 MG capsule, Take 1 capsule (300 mg total) by mouth 3 (three) times daily. (Patient taking differently: Take 300 mg by mouth daily. ), Disp: 90 capsule, Rfl: 2 .  ipratropium-albuterol (DUONEB) 0.5-2.5 (3) MG/3ML SOLN, Take 3 mLs every 4 (four) hours as needed by nebulization., Disp: 360 mL, Rfl: 0 .  omeprazole (PRILOSEC) 10 MG capsule, Take 1 capsule (10 mg total) by mouth daily., Disp: 30 capsule, Rfl: 0 .  Vitamin D, Ergocalciferol, (DRISDOL) 50000 units CAPS capsule, Take by mouth., Disp: , Rfl:  .  budesonide-formoterol (SYMBICORT) 160-4.5 MCG/ACT inhaler, Inhale 2 puffs into the lungs 2 (two) times daily., Disp: 1 Inhaler, Rfl: 2 .  fluticasone (FLONASE) 50 MCG/ACT nasal spray, Place 2 sprays into both nostrils daily., Disp: 16 g, Rfl: 5 .  montelukast (SINGULAIR) 10 MG tablet, Take 1 tablet (10 mg total) by mouth at bedtime., Disp: 90 tablet, Rfl: 1 .  olmesartan-hydrochlorothiazide (BENICAR HCT) 20-12.5 MG tablet, Take 1 tablet by mouth daily., Disp: 90 tablet, Rfl: 0 .  PROAIR HFA 108 (90 Base) MCG/ACT inhaler, INHALE 2 PUFFS BY MOUTH EVERY 6 HOURS AS NEEDED FOR WHEEZING OR  SHORTNESS  OF  BREATH,  Disp: 9 each, Rfl: 0  Allergies  Allergen Reactions  . Fish Allergy Shortness Of Breath  . Penicillins Anaphylaxis and Rash    Childhood allergy  . Adhesive [Tape] Rash    Paper tape ok to use.     ROS  Constitutional: Negative for fever , positive for  weight change.  Respiratory: Positive  for cough and shortness of breath.   Cardiovascular: Negative for chest pain or palpitations.  Gastrointestinal: Negative for abdominal pain, no bowel changes.  Musculoskeletal: Positive  for gait problem but no  joint swelling.  Skin: Negative for rash.  Neurological: Negative for dizziness or headache.  No other specific complaints in a complete review of systems (except as listed in HPI above).  Objective  Vitals:   10/17/17 1010  BP: 138/86  Pulse: 70  Resp: 16  Temp: 98.3 F (36.8 C)  TempSrc: Oral  SpO2: 99%  Weight: 275 lb 1.6 oz (124.8 kg)  Height: 5' 1"  (1.549 m)    Body mass index is 51.98 kg/m.  Physical Exam  Constitutional: Patient appears well-developed and well-nourished. Obese  No distress.  HEENT: head atraumatic, normocephalic, pupils equal and reactive to light, neck supple, throat within normal limits Cardiovascular: Normal rate, regular rhythm and normal heart sounds.  No murmur heard. No BLE edema. Pulmonary/Chest: Effort normal and breath sounds normal. No respiratory distress. Abdominal: Soft.  There is no tenderness. Psychiatric: Patient has a normal mood and affect. behavior is normal. Judgment and thought content normal.  Recent Results (from the past 2160 hour(s))  Glucose, capillary     Status: Abnormal   Collection Time: 08/18/17  4:13 PM  Result Value Ref Range   Glucose-Capillary 217 (H) 65 - 99 mg/dL  Basic metabolic panel     Status: Abnormal   Collection Time: 08/18/17  4:15 PM  Result Value Ref Range   Sodium 133 (L) 135 - 145 mmol/L   Potassium 3.7 3.5 - 5.1 mmol/L   Chloride 98 (L) 101 - 111 mmol/L   CO2 24 22 - 32 mmol/L  Glucose, Bld 221 (H) 65 - 99 mg/dL   BUN 9 6 - 20 mg/dL   Creatinine, Ser 0.80 0.44 - 1.00 mg/dL   Calcium 8.6 (L) 8.9 - 10.3 mg/dL   GFR calc non Af Amer >60 >60 mL/min   GFR calc Af Amer >60 >60 mL/min    Comment: (NOTE) The eGFR has been calculated using the CKD EPI equation. This calculation has not been validated in all clinical situations. eGFR's persistently <60 mL/min signify possible Chronic Kidney Disease.    Anion gap 11 5 - 15    Comment: Performed at [facility], [address] [place], [place] [address]  CBC     Status: Abnormal   Collection Time: 08/18/17  4:15 PM  Result Value Ref Range   WBC 9.6 3.6 - 11.0 K/uL   RBC 5.16 3.80 - 5.20 MIL/uL   Hemoglobin 14.0 12.0 - 16.0 g/dL   HCT 41.1 35.0 - 47.0 %   MCV 79.5 (L) 80.0 - 100.0 fL   MCH 27.2 26.0 - 34.0 pg   MCHC 34.2 32.0 - 36.0 g/dL   RDW 14.1 11.5 - 14.5 %   Platelets 245 150 - 440 K/uL    Comment: Performed at [facility], [address] [place], [place] [address]  Urinalysis, Complete w Microscopic     Status: Abnormal   Collection Time: 08/18/17  4:15 PM  Result Value Ref Range   Color, Urine YELLOW (A) YELLOW   APPearance CLEAR (A) CLEAR   Specific Gravity, Urine 1.018 1.005 - 1.030   pH 5.0 5.0 - 8.0   Glucose, UA NEGATIVE NEGATIVE mg/dL   Hgb urine dipstick NEGATIVE NEGATIVE   Bilirubin Urine NEGATIVE NEGATIVE   Ketones, ur NEGATIVE NEGATIVE mg/dL   Protein, ur NEGATIVE NEGATIVE mg/dL   Nitrite NEGATIVE NEGATIVE   Leukocytes, UA NEGATIVE NEGATIVE   RBC / HPF 0-5 0 - 5 RBC/hpf   WBC, UA 0-5 0 - 5 WBC/hpf   Bacteria, UA RARE (A) NONE SEEN   Squamous Epithelial / LPF 6-10 0 - 5   Mucus PRESENT    Hyaline Casts, UA PRESENT     Comment: Performed at [facility], [address] [place], [place] [address]  Pregnancy, urine     Status: None   Collection Time: 08/18/17  4:15 PM  Result Value Ref Range   Preg Test, Ur NEGATIVE NEGATIVE    Comment: Performed at  [facility], [address] [place], [place] [address]  CBC with Differential     Status: Abnormal   Collection Time: 08/21/17  9:09 AM  Result Value Ref Range   WBC 7.3 3.6 - 11.0 K/uL   RBC 4.89 3.80 - 5.20 MIL/uL   Hemoglobin 13.1 12.0 - 16.0 g/dL   HCT 38.7 35.0 - 47.0 %   MCV 79.2 (L) 80.0 - 100.0 fL   MCH 26.8 26.0 - 34.0 pg   MCHC 33.9 32.0 - 36.0 g/dL   RDW 14.4 11.5 - 14.5 %   Platelets 272 150 - 440 K/uL   Neutrophils Relative % 58 %   Neutro Abs 4.3 1.4 - 6.5 K/uL   Lymphocytes Relative 32 %   Lymphs Abs 2.3 1.0 - 3.6 K/uL   Monocytes Relative 7 %   Monocytes Absolute 0.5 0.2 - 0.9 K/uL   Eosinophils Relative 2 %   Eosinophils Absolute 0.1 0 - 0.7 K/uL   Basophils Relative 1 %   Basophils Absolute 0.1 0 -  0.1 K/uL    Comment: Performed at [facility], [address] [place], [place] [address]  Comprehensive metabolic panel     Status: Abnormal   Collection Time: 08/21/17  9:09 AM  Result Value Ref Range   Sodium 135 135 - 145 mmol/L   Potassium 3.8 3.5 - 5.1 mmol/L   Chloride 103 101 - 111 mmol/L   CO2 23 22 - 32 mmol/L   Glucose, Bld 120 (H) 65 - 99 mg/dL   BUN 10 6 - 20 mg/dL   Creatinine, Ser 0.69 0.44 - 1.00 mg/dL   Calcium 8.3 (L) 8.9 - 10.3 mg/dL   Total Protein 6.9 6.5 - 8.1 g/dL   Albumin 3.6 3.5 - 5.0 g/dL   AST 21 15 - 41 U/L   ALT 14 14 - 54 U/L   Alkaline Phosphatase 49 38 - 126 U/L   Total Bilirubin 0.5 0.3 - 1.2 mg/dL   GFR calc non Af Amer >60 >60 mL/min   GFR calc Af Amer >60 >60 mL/min    Comment: (NOTE) The eGFR has been calculated using the CKD EPI equation. This calculation has not been validated in all clinical situations. eGFR's persistently <60 mL/min signify possible Chronic Kidney Disease.    Anion gap 9 5 - 15    Comment: Performed at [facility], [address] [place], [place] [address]  Lipase, blood     Status: None   Collection Time: 08/21/17  9:09 AM  Result Value Ref Range   Lipase 28  11 - 51 U/L    Comment: Performed at [facility], [address] [place], [place] [address]  Urinalysis, Complete w Microscopic     Status: Abnormal   Collection Time: 08/21/17  9:10 AM  Result Value Ref Range   Color, Urine YELLOW (A) YELLOW   APPearance CLOUDY (A) CLEAR   Specific Gravity, Urine 1.017 1.005 - 1.030   pH 5.0 5.0 - 8.0   Glucose, UA NEGATIVE NEGATIVE mg/dL   Hgb urine dipstick NEGATIVE NEGATIVE   Bilirubin Urine NEGATIVE NEGATIVE   Ketones, ur NEGATIVE NEGATIVE mg/dL   Protein, ur NEGATIVE NEGATIVE mg/dL   Nitrite NEGATIVE NEGATIVE   Leukocytes, UA NEGATIVE NEGATIVE   RBC / HPF 0-5 0 - 5 RBC/hpf   WBC, UA 0-5 0 - 5 WBC/hpf   Bacteria, UA FEW (A) NONE SEEN   Squamous Epithelial / LPF 11-20 0 - 5   Mucus PRESENT     Comment: Performed at [facility], [address] [place], [place] [address]  POCT UA - Microalbumin     Status: Normal   Collection Time: 10/17/17 10:14 AM  Result Value Ref Range   Microalbumin Ur, POC 20 mg/L   Creatinine, POC     Albumin/Creatinine Ratio, Urine, POC    POCT HgB A1C     Status: Abnormal   Collection Time: 10/17/17 10:24 AM  Result Value Ref Range   Hemoglobin A1C 6.9 (A) 4.0 - 5.6 %   HbA1c POC (<> result, manual entry)     HbA1c, POC (prediabetic range)     HbA1c, POC (controlled diabetic range)       PHQ2/9: Depression screen [facility] 2/9 10/17/2017 07/17/2017 10/09/2016 06/26/2016 04/27/2016  Decreased Interest 0 0 0 0 0  Down, Depressed, Hopeless 0 0 0 0 0  PHQ - 2 Score 0 0 0 0 0     Fall Risk: Fall Risk  10/17/2017 08/23/2017 07/17/2017 04/23/2017 02/28/2017  Falls in the past year? No No No No No     Functional Status Survey: Is the patient deaf or have difficulty hearing?: No Does the patient have difficulty seeing, even when wearing glasses/contacts?: No Does the patient have difficulty concentrating, remembering, or making decisions?: No Does the patient have difficulty walking or climbing  stairs?: No Does the patient have difficulty dressing or bathing?: No Does the patient have difficulty doing errands alone such as visiting a doctor's office or shopping?: No    Assessment & Plan  1. Controlled type 2 diabetes mellitus with microalbuminuria, without long-term current use of insulin (HCC)  - POCT HgB A1C - POCT UA - Microalbumin - Semaglutide (OZEMPIC) 1 MG/DOSE SOPN; Inject 1 mg into the skin once a week.  Dispense: 3 mL; Refill: 2  2. Dyslipidemia associated with type 2 diabetes mellitus (HCC)  - Semaglutide (OZEMPIC) 1 MG/DOSE SOPN; Inject 1 mg into the skin once a week.  Dispense: 3 mL; Refill: 2  3. Perennial allergic rhinitis  - fluticasone (FLONASE) 50 MCG/ACT nasal spray; Place 2 sprays into both nostrils daily.  Dispense: 16 g; Refill: 5  4. Asthma, moderate persistent, well-controlled  - montelukast (SINGULAIR) 10 MG tablet; Take 1 tablet (10 mg total) by mouth at bedtime.  Dispense: 90 tablet; Refill: 1 - PROAIR HFA 108 (90 Base) MCG/ACT inhaler; INHALE 2 PUFFS BY MOUTH EVERY 6 HOURS AS NEEDED FOR WHEEZING OR  SHORTNESS  OF  BREATH  Dispense: 9 each; Refill: 0  5. Benign essential HTN  - olmesartan-hydrochlorothiazide (BENICAR HCT) 20-12.5 MG tablet; Take 1 tablet by mouth daily.  Dispense: 90 tablet; Refill: 0  6. Morbid obesity due to excess calories [facility])  Discussed with the patient the risk posed by an increased BMI. Discussed importance of portion control, calorie counting and at least 150 minutes of physical activity weekly. Avoid sweet beverages and drink more water. Eat at least 6 servings of fruit and vegetables daily   7. Dyslipidemia   8. DDD (degenerative disc disease), lumbosacral   9. Carpal tunnel syndrome on both sides   10. Neuropathy due to type 2 diabetes mellitus ([place])   11. Uterine leiomyoma, unspecified location  - Ambulatory referral to Obstetrics / Gynecology

## 2017-10-21 ENCOUNTER — Telehealth: Payer: Self-pay | Admitting: Obstetrics and Gynecology

## 2017-10-21 NOTE — Telephone Encounter (Signed)
[facility] referring for Uterine leiomyoma, unspecified location. Patient is SDJ patient. Called and left voicemail for patient to call back to be schedule

## 2017-10-23 LAB — HM DIABETES EYE EXAM

## 2017-10-23 NOTE — Telephone Encounter (Signed)
Called and left voice mail for patient to call back to be schedule °

## 2017-10-25 ENCOUNTER — Encounter: Payer: Self-pay | Admitting: Family Medicine

## 2017-10-25 NOTE — Progress Notes (Unsigned)
Other

## 2017-10-28 ENCOUNTER — Ambulatory Visit (INDEPENDENT_AMBULATORY_CARE_PROVIDER_SITE_OTHER): Payer: BLUE CROSS/BLUE SHIELD | Admitting: Obstetrics and Gynecology

## 2017-10-28 ENCOUNTER — Encounter: Payer: Self-pay | Admitting: Obstetrics and Gynecology

## 2017-10-28 VITALS — BP 110/76 | HR 65 | Ht 61.0 in | Wt 277.0 lb

## 2017-10-28 DIAGNOSIS — D251 Intramural leiomyoma of uterus: Secondary | ICD-10-CM | POA: Diagnosis not present

## 2017-10-28 DIAGNOSIS — N946 Dysmenorrhea, unspecified: Secondary | ICD-10-CM | POA: Diagnosis not present

## 2017-10-28 DIAGNOSIS — D252 Subserosal leiomyoma of uterus: Secondary | ICD-10-CM | POA: Diagnosis not present

## 2017-10-28 DIAGNOSIS — M545 Low back pain, unspecified: Secondary | ICD-10-CM

## 2017-10-28 DIAGNOSIS — N921 Excessive and frequent menstruation with irregular cycle: Secondary | ICD-10-CM | POA: Insufficient documentation

## 2017-10-28 DIAGNOSIS — N914 Secondary oligomenorrhea: Secondary | ICD-10-CM

## 2017-10-28 DIAGNOSIS — D259 Leiomyoma of uterus, unspecified: Secondary | ICD-10-CM | POA: Insufficient documentation

## 2017-10-28 DIAGNOSIS — Z8742 Personal history of other diseases of the female genital tract: Secondary | ICD-10-CM

## 2017-10-28 NOTE — Progress Notes (Signed)
Obstetrics & Gynecology Office Visit   Chief Complaint  Patient presents with  . Referral    Pt here to follow up from fibroid. Pt states she went to the ER back in June and that is when she was told it had grown.   Referral from Dr. Steele Sizer, MD, at [facility] for fibroid uterus  History of Present Illness: 47 y.o. G0P0000 female who is seen in referral from Dr. Steele Sizer, at [facility] for the above.  She was seen in the ER in June and was noted to have an enlarging fibroid on her uterus on pelvic ultrasound.  In the ER she was given a combined, triphasic oral contraceptive pill.  When she took the medication she started having cramping.  This was so severe that she stopped taking the pill.  The patient reports that she would go 3-4 months without a menses. She would have a menses that was "pretty heavy."  She would pass some clots with her regular menses.  Her menses would last 6-7 days.  With the hormonal medication she was given in the ER she did not have bleeding, but she did have a very heavy menses when she finished the medication.  She had large clots and the menses lasted 7 days.  She started the next pack and took two weeks of the pack. Her cramping was so much so that she just stopped the pack.  Her last period was at the end of last month.  She feels like she is trying to start now, according to her.   The ultrasound from the ER on 08/18/17 showed: Uterus: 6.3 x 3.8 x 6.7 cm.  Large partially exophytic mass/fibroid on the left uterine fundus measuring 4.5 x 4.6 x 4.1 cm (increased in size compared to prior ultrasound measurements of 3.3 x 3.4 x 3.5 cm in 09/2015).  Also, noted was a small hypoechoic mass within the right lower uterine segment measuring 1.1 x 0.6 x 1.2 cm.   The endometrium measured 7.5 mm without focal abnormality.  Right ovary measured 1.7 x 2.7 x 1.7 cm.  Also, noted was a cyst measuring 1.8 x 1.5 x 1.6 cm.  Left ovary was absent  (surgically).  Notably, she has a history of endometriosis and left oophorectomy, right salpingectomy for hydrosalpinx, and a benign right serous cystadenoma. She continues to have lower back pain.  She does have neuropathy in both legs.  She takes gabapentin for this and it does help. Her back does still hurt, though.  She states that her back pian is in lower back across the lower portion.  The pain does radiate down her legs.  She rates the pain at a 5/10 on a constant basis.  After working all day the pain can be 9-10/10.  Aggravating factors: picking up heavy objects.  Also, riding in a car for long periods of time make the pain worse.  Alleviating factors: heating pad helps some.  Motrin also helps.  Associated symptoms:  None.    Past Medical History:  Diagnosis Date  . Abnormal CBC   . Allergy   . Arthritis    left knee  . Asthma   . Cervical polyp   . Chronic foot pain   . Complication of anesthesia    takes longer to wake up.  . Contact dermatitis   . Diabetes mellitus without complication ([name])   . Dysmenorrhea   . Edema   . 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  GERD (gastroesophageal reflux disease)   . Headache    history of migraines  . Hyperlipidemia   . Hypertension   . Lumbago   . Neuropathy   . Obesity   . Occasional tremors     Past Surgical History:  Procedure Laterality Date  . APPENDECTOMY    . cervical polyp removal    . DILATION AND CURETTAGE OF UTERUS    . HYSTEROSCOPY    . HYSTEROSCOPY W/D&C N/A 10/12/2015   Procedure: DILATATION AND CURETTAGE /HYSTEROSCOPY;  Surgeon: Will Bonnet, MD;  Location: [facility] ORS;  Service: Gynecology;  Laterality: N/A;  . LAPAROSCOPIC BILATERAL SALPINGO OOPHERECTOMY N/A 10/12/2015   Procedure: LAPAROSCOPIC RIGHT SALPINGECTOMY, LEFT OOPHORECTOMY;  Surgeon: Will Bonnet, MD;  Location: [facility] ORS;  Service: Gynecology;  Laterality: N/A;  . LAPAROSCOPIC OVARIAN CYSTECTOMY Bilateral 10/12/2015   Procedure: LAPAROSCOPIC OVARIAN  CYSTECTOMY;  Surgeon: Will Bonnet, MD;  Location: [facility] ORS;  Service: Gynecology;  Laterality: Bilateral;  . OOPHORECTOMY    . OVARIAN CYST REMOVAL      Gynecologic History: Patient's last menstrual period was 09/26/2017.  Obstetric History: G0P0000  Family History  Problem Relation Age of Onset  . Hypertension Mother   . Diabetes Mother   . CAD Mother   . Cancer Mother        cervical  . Heart attack Mother 75       Triple Bypass  . CAD Father   . Hypertension Father   . Arthritis Father   . Thyroid disease Sister   . Alzheimer's disease Maternal Grandmother   . Alzheimer's disease Maternal Grandfather   . Breast cancer Paternal Grandmother     Social History   Socioeconomic History  . Marital status: Married    Spouse name: Not on file  . Number of children: Not on file  . Years of education: Not on file  . Highest education level: Not on file  Occupational History  . Occupation: Museum/gallery curator  Social Needs  . Financial resource strain: Not on file  . Food insecurity:    Worry: Not on file    Inability: Not on file  . Transportation needs:    Medical: Not on file    Non-medical: Not on file  Tobacco Use  . Smoking status: Former Smoker    Years: 1.00    Types: Cigarettes    Start date: 12/03/2001    Last attempt to quit: 12/28/2002    Years since quitting: 14.8  . Smokeless tobacco: Never Used  . Tobacco comment: smoked 3 to 4 cigarrettes a day for a year  Substance and Sexual Activity  . Alcohol use: Yes    Alcohol/week: 0.0 standard drinks    Comment: occasionally drinks margarita, once or twice a year  . Drug use: No  . Sexual activity: Yes    Partners: Male    Comment: Partial Hysterectomy  Lifestyle  . Physical activity:    Days per week: Not on file    Minutes per session: Not on file  . Stress: Not on file  Relationships  . Social connections:    Talks on phone: Not on file    Gets together: Not on file    Attends religious service:  Not on file    Active member of club or organization: Not on file    Attends meetings of clubs or organizations: Not on file    Relationship status: Not on file  . Intimate partner violence:    Fear  of current or ex partner: Not on file    Emotionally abused: Not on file    Physically abused: Not on file    Forced sexual activity: Not on file  Other Topics Concern  . Not on file  Social History Narrative  . Not on file    Allergies  Allergen Reactions  . Fish Allergy Shortness Of Breath  . Penicillins Anaphylaxis and Rash    Childhood allergy  . Adhesive [Tape] Rash    Paper tape ok to use.    Prior to Admission medications   Medication Sig Start Date End Date Taking? Authorizing Provider  aspirin EC 81 MG tablet Take 1 tablet (81 mg total) by mouth daily. 10/09/16  Yes Sowles, Drue Stager, MD  atorvastatin (LIPITOR) 40 MG tablet Take 1 tablet (40 mg total) by mouth daily. 04/23/17  Yes Sowles, Drue Stager, MD  budesonide-formoterol [facility]) 160-4.5 MCG/ACT inhaler Inhale 2 puffs into the lungs 2 (two) times daily. 10/17/17  Yes Sowles, Drue Stager, MD  Cholecalciferol (VITAMIN D) 2000 UNITS tablet Take 1 tablet by mouth daily. 08/23/10  Yes [provider]  fluticasone (FLONASE) 50 MCG/ACT nasal spray Place 2 sprays into both nostrils daily. 10/17/17  Yes Sowles, Drue Stager, MD  gabapentin (NEURONTIN) 100 MG capsule Take 1 capsule by mouth 2 (two) times daily. 10/15/17  Yes Lang Snow, NP  gabapentin (NEURONTIN) 300 MG capsule Take 1 capsule (300 mg total) by mouth 3 (three) times daily. Patient taking differently: Take 300 mg by mouth daily.  06/24/17 06/24/18 Yes Cuthriell, Charline Bills, PA-C  IBUPROFEN PO Take by mouth as needed.   Yes [provider]  ipratropium-albuterol (DUONEB) 0.5-2.5 (3) MG/3ML SOLN Take 3 mLs every 4 (four) hours as needed by nebulization. 01/09/17  Yes Sowles, Drue Stager, MD  loratadine (CLARITIN) 10 MG tablet Take 10 mg by mouth daily.   Yes  [provider]  montelukast (SINGULAIR) 10 MG tablet Take 1 tablet (10 mg total) by mouth at bedtime. 10/17/17  Yes Sowles, Drue Stager, MD  olmesartan-hydrochlorothiazide (BENICAR HCT) 20-12.5 MG tablet Take 1 tablet by mouth daily. 10/17/17  Yes Sowles, Drue Stager, MD  omeprazole (PRILOSEC) 10 MG capsule Take 1 capsule (10 mg total) by mouth daily. 06/24/17  Yes Cuthriell, Charline Bills, PA-C  PROAIR HFA 108 (90 Base) MCG/ACT inhaler INHALE 2 PUFFS BY MOUTH EVERY 6 HOURS AS NEEDED FOR WHEEZING OR  SHORTNESS  OF  BREATH 10/17/17  Yes Sowles, Drue Stager, MD  Semaglutide (OZEMPIC) 1 MG/DOSE SOPN Inject 1 mg into the skin once a week. 10/17/17  Yes Sowles, Drue Stager, MD  Vitamin D, Ergocalciferol, (DRISDOL) 50000 units CAPS capsule Take by mouth. 09/13/17 11/02/17 Yes [provider]    Review of Systems  Constitutional: Positive for malaise/fatigue. Negative for chills, diaphoresis, fever and weight loss.  HENT: Negative.   Eyes: Negative.   Respiratory: Positive for cough, shortness of breath (with ashtma flare. And with exertion (climbing stairs)) and wheezing. Negative for hemoptysis and sputum production.   Cardiovascular: Negative.   Gastrointestinal: Negative.   Genitourinary: Negative.   Musculoskeletal: Negative.   Skin: Negative.   Neurological: Positive for tingling. Negative for dizziness, tremors, sensory change, speech change, focal weakness, seizures, loss of consciousness, weakness and headaches.  Endo/Heme/Allergies: Positive for environmental allergies. Negative for polydipsia. Bruises/bleeds easily.  Psychiatric/Behavioral: Negative.   Functional status: unable to climb a flight of stairs without getting short of breath.   Physical Exam BP 110/76 (BP Location: Left Arm, Patient Position: Sitting, Cuff Size: Large)  Pulse 65   Ht 5\' 1"  (1.549 m)   Wt 277 lb (125.6 kg)   LMP 09/26/2017   SpO2 97%   BMI 52.34 kg/m  Patient's last menstrual period was 09/26/2017. Physical  Exam  Constitutional: She is oriented to person, place, and time. She appears well-developed and well-nourished. No distress.  HENT:  Head: Normocephalic and atraumatic.  Eyes: EOM are normal. No scleral icterus.  Neck: Normal range of motion. Neck supple.  Cardiovascular: Normal rate and regular rhythm.  Pulmonary/Chest: Effort normal and breath sounds normal. No respiratory distress. She has no wheezes. She has no rales.  Abdominal: Soft. Bowel sounds are normal. She exhibits no distension and no mass. There is no tenderness. There is no rebound and no guarding.  Musculoskeletal: Normal range of motion. She exhibits no edema.  Neurological: She is alert and oriented to person, place, and time. No cranial nerve deficit.  Skin: Skin is warm and dry. No erythema.  Psychiatric: She has a normal mood and affect. Her behavior is normal. Judgment normal.    Assessment: 47 y.o. G0P0000 female here for  1. Intramural and subserous leiomyoma of uterus   2. History of endometriosis   3. Secondary oligomenorrhea   4. Dysmenorrhea   5. Menorrhagia with irregular cycle   6. Acute midline low back pain without sciatica      Plan: Problem List Items Addressed This Visit      Genitourinary   Dysmenorrhea   Relevant Orders   US PELVIS TRANSVANGINAL NON-OB (TV ONLY)   Fibroid uterus - Primary   Relevant Orders   US PELVIS TRANSVANGINAL NON-OB (TV ONLY)     Other   Oligomenorrhea   History of endometriosis   Relevant Orders   US PELVIS TRANSVANGINAL NON-OB (TV ONLY)   Menorrhagia with irregular cycle   Relevant Orders   US PELVIS TRANSVANGINAL NON-OB (TV ONLY)    Other Visit Diagnoses    Acute midline low back pain without sciatica       Relevant Medications   IBUPROFEN PO   Other Relevant Orders   US PELVIS TRANSVANGINAL NON-OB (TV ONLY)     The pain is a new issue for the patient. It is unclear whether this is from her fibroid, which has grown only 1 cm in two years.  She also has  a history of endometriosis.  So, this could also play a role in her pain, though I am not certain that after two years, this would cause acute-onset pain of this nature. I am also concerned about her oligomenorrhea. This increases her risk of uterine cancer. Given her body habitus, this also makes me concerned about her risk for developing uterine cancer.  I will obtain a pelvic ultrasound. Discussed that for her bleeding, I would like to treat this. Discussed options of doing nothing, a Mirena or Liletta IUD, or surgery. Given her medical comorbidities, she is not a good candidate for surgery as a first option.  Will make a final decision at her follow up from her ultrasound.   Prentice Docker, MD 10/28/2017 9:46 AM    CC: Steele Sizer, MD [address] [place], [place] [address]

## 2017-11-06 ENCOUNTER — Ambulatory Visit (INDEPENDENT_AMBULATORY_CARE_PROVIDER_SITE_OTHER): Payer: BLUE CROSS/BLUE SHIELD

## 2017-11-06 ENCOUNTER — Encounter: Payer: Self-pay | Admitting: Obstetrics and Gynecology

## 2017-11-06 ENCOUNTER — Ambulatory Visit (INDEPENDENT_AMBULATORY_CARE_PROVIDER_SITE_OTHER): Payer: BLUE CROSS/BLUE SHIELD | Admitting: Obstetrics and Gynecology

## 2017-11-06 VITALS — BP 132/88 | Ht 62.0 in | Wt 272.0 lb

## 2017-11-06 DIAGNOSIS — N921 Excessive and frequent menstruation with irregular cycle: Secondary | ICD-10-CM | POA: Diagnosis not present

## 2017-11-06 DIAGNOSIS — Z8742 Personal history of other diseases of the female genital tract: Secondary | ICD-10-CM

## 2017-11-06 DIAGNOSIS — N809 Endometriosis, unspecified: Secondary | ICD-10-CM | POA: Insufficient documentation

## 2017-11-06 DIAGNOSIS — D252 Subserosal leiomyoma of uterus: Secondary | ICD-10-CM | POA: Diagnosis not present

## 2017-11-06 DIAGNOSIS — M545 Low back pain, unspecified: Secondary | ICD-10-CM

## 2017-11-06 DIAGNOSIS — D251 Intramural leiomyoma of uterus: Secondary | ICD-10-CM | POA: Diagnosis not present

## 2017-11-06 DIAGNOSIS — N946 Dysmenorrhea, unspecified: Secondary | ICD-10-CM | POA: Diagnosis not present

## 2017-11-06 MED ORDER — NORETHINDRONE ACETATE 5 MG PO TABS: 5.0000 mg | ORAL_TABLET | Freq: Every day | ORAL | 1 refills | Status: DC

## 2017-11-06 NOTE — Progress Notes (Signed)
Gynecology Ultrasound Follow Up   Chief Complaint  Patient presents with  . Follow-up    U/S Follow Up  Follow up for pelvic pain and possible increase in size of fibroid, dysmenorrhea, menorrhagia with irregular cycle.  History of Present Illness: Patient is a 47 y.o. female who presents today for ultrasound evaluation of the above.  Ultrasound demonstrates the following findings Adnexa:  No masses note. Left ovary surgically absent Uterus: retroflexed with endometrial stripe  3.4 mm Additional: left fundal fibroid measuring 3.5 x 3.3 x 3.1 cm. Another anterior fibroid measuring 1 x 0.8 x 1.4 cm.   Past Medical History:  Diagnosis Date  . Abnormal CBC   . Allergy   . Arthritis    left knee  . Asthma   . Cervical polyp   . Chronic foot pain   . Complication of anesthesia    takes longer to wake up.  . Contact dermatitis   . Diabetes mellitus without complication ([place])   . Dysmenorrhea   . Edema   . 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 GERD (gastroesophageal reflux disease)   . Headache    history of migraines  . Hyperlipidemia   . Hypertension   . Lumbago   . Neuropathy   . Obesity   . Occasional tremors     Past Surgical History:  Procedure Laterality Date  . APPENDECTOMY    . cervical polyp removal    . DILATION AND CURETTAGE OF UTERUS    . HYSTEROSCOPY    . HYSTEROSCOPY W/D&C N/A 10/12/2015   Procedure: DILATATION AND CURETTAGE /HYSTEROSCOPY;  Surgeon: Will Bonnet, MD;  Location: [facility] ORS;  Service: Gynecology;  Laterality: N/A;  . LAPAROSCOPIC BILATERAL SALPINGO OOPHERECTOMY N/A 10/12/2015   Procedure: LAPAROSCOPIC RIGHT SALPINGECTOMY, LEFT OOPHORECTOMY;  Surgeon: Will Bonnet, MD;  Location: [facility] ORS;  Service: Gynecology;  Laterality: N/A;  . LAPAROSCOPIC OVARIAN CYSTECTOMY Bilateral 10/12/2015   Procedure: LAPAROSCOPIC OVARIAN CYSTECTOMY;  Surgeon: Will Bonnet, MD;  Location: [facility] ORS;  Service: Gynecology;  Laterality: Bilateral;  . OOPHORECTOMY     . OVARIAN CYST REMOVAL     Family History  Problem Relation Age of Onset  . Hypertension Mother   . Diabetes Mother   . CAD Mother   . Cancer Mother        cervical  . Heart attack Mother 52       Triple Bypass  . CAD Father   . Hypertension Father   . Arthritis Father   . Thyroid disease Sister   . Alzheimer's disease Maternal Grandmother   . Alzheimer's disease Maternal Grandfather   . Breast cancer Paternal Grandmother     Social History   Socioeconomic History  . Marital status: Married    Spouse name: Not on file  . Number of children: Not on file  . Years of education: Not on file  . Highest education level: Not on file  Occupational History  . Occupation: Museum/gallery curator  Social Needs  . Financial resource strain: Not on file  . Food insecurity:    Worry: Not on file    Inability: Not on file  . Transportation needs:    Medical: Not on file    Non-medical: Not on file  Tobacco Use  . Smoking status: Former Smoker    Years: 1.00    Types: Cigarettes    Start date: 12/03/2001    Last attempt to quit: 12/28/2002    Years since quitting: 14.8  .  Smokeless tobacco: Never Used  . Tobacco comment: smoked 3 to 4 cigarrettes a day for a year  Substance and Sexual Activity  . Alcohol use: Yes    Alcohol/week: 0.0 standard drinks    Comment: occasionally drinks margarita, once or twice a year  . Drug use: No  . Sexual activity: Yes    Partners: Male    Comment: Partial Hysterectomy  Lifestyle  . Physical activity:    Days per week: Not on file    Minutes per session: Not on file  . Stress: Not on file  Relationships  . Social connections:    Talks on phone: Not on file    Gets together: Not on file    Attends religious service: Not on file    Active member of club or organization: Not on file    Attends meetings of clubs or organizations: Not on file    Relationship status: Not on file  . Intimate partner violence:    Fear of current or ex partner: Not  on file    Emotionally abused: Not on file    Physically abused: Not on file    Forced sexual activity: Not on file  Other Topics Concern  . Not on file  Social History Narrative  . Not on file    Allergies  Allergen Reactions  . Fish Allergy Shortness Of Breath  . Penicillins Anaphylaxis and Rash    Childhood allergy  . Adhesive [Tape] Rash    Paper tape ok to use.    Prior to Admission medications   Medication Sig Start Date End Date Taking? Authorizing Provider  aspirin EC 81 MG tablet Take 1 tablet (81 mg total) by mouth daily. 10/09/16   Steele Sizer, MD  atorvastatin (LIPITOR) 40 MG tablet Take 1 tablet (40 mg total) by mouth daily. 04/23/17   Steele Sizer, MD  budesonide-formoterol (SYMBICORT) 160-4.5 MCG/ACT inhaler Inhale 2 puffs into the lungs 2 (two) times daily. 10/17/17   Steele Sizer, MD  Cholecalciferol (VITAMIN D) 2000 UNITS tablet Take 1 tablet by mouth daily. 08/23/10   [provider]  fluticasone (FLONASE) 50 MCG/ACT nasal spray Place 2 sprays into both nostrils daily. 10/17/17   Steele Sizer, MD  gabapentin (NEURONTIN) 100 MG capsule Take 1 capsule by mouth 2 (two) times daily. 10/15/17   Lang Snow, NP  gabapentin (NEURONTIN) 300 MG capsule Take 1 capsule (300 mg total) by mouth 3 (three) times daily. Patient taking differently: Take 300 mg by mouth daily.  06/24/17 06/24/18  Cuthriell, Charline Bills, PA-C  IBUPROFEN PO Take by mouth as needed.    [provider]  ipratropium-albuterol (DUONEB) 0.5-2.5 (3) MG/3ML SOLN Take 3 mLs every 4 (four) hours as needed by nebulization. 01/09/17   Steele Sizer, MD  loratadine (CLARITIN) 10 MG tablet Take 10 mg by mouth daily.    [provider]  montelukast (SINGULAIR) 10 MG tablet Take 1 tablet (10 mg total) by mouth at bedtime. 10/17/17   Steele Sizer, MD  olmesartan-hydrochlorothiazide (BENICAR HCT) 20-12.5 MG tablet Take 1 tablet by mouth daily. 10/17/17   Steele Sizer, MD    omeprazole (PRILOSEC) 10 MG capsule Take 1 capsule (10 mg total) by mouth daily. 06/24/17   Cuthriell, Charline Bills, PA-C  PROAIR HFA 108 (90 Base) MCG/ACT inhaler INHALE 2 PUFFS BY MOUTH EVERY 6 HOURS AS NEEDED FOR WHEEZING OR  SHORTNESS  OF  BREATH 10/17/17   Ancil Boozer, Drue Stager, MD  Semaglutide [facility]) 1 MG/DOSE SOPN Inject  1 mg into the skin once a week. 10/17/17   Steele Sizer, MD    Physical Exam BP 132/88   Ht 5\' 2"  (1.575 m)   Wt 272 lb (123.4 kg)   BMI 49.75 kg/m    General: NAD HEENT: normocephalic, anicteric Pulmonary: No increased work of breathing Extremities: no edema, erythema, or tenderness Neurologic: Grossly intact, normal gait Psychiatric: mood appropriate, affect full  US Pelvis Transvanginal Non-ob (tv Only)  Result Date: 11/06/2017 ULTRASOUND REPORT Location: [facility] Date of Service: 11/06/2017 Patient Name: Kelsey Alvarez DOB: 1971/01/10 MRN: [id number] Comparison:  Pelvic ultrasound on 08/18/2017 Indications:Fibroid, Dysmenorrhea Findings: The uterus is retroverted and measures 6.87 x 6.41 x 4.01 cm. Echo texture is homogenous with evidence of focal masses. Within the uterus is suspected fibroid measuring: Fibroid 1:Left of uterus, fundal measuring 3.5 x 3.3 x 3.1 cm Anterior hypoechoic area fibroid vs other measuring 1.0 x 0.8 x 1.4 cm. The Endometrium measures 3.43 mm. Right Ovary measures 2.51 x 2.50 x 1.69 cm. It is normal in appearance. Left Ovary is surgically absent. Survey of the adnexa demonstrates no adnexal masses. There is no free fluid in the cul de sac. Impression: 1. Left Oophorectomy. 2. Left of uterus fibroid measuring  3.5 x 3.3 x 3.1 cm.  This measurement is somewhat smaller than the previous ultrasound in June, 2019, measuring 4.5 x 4.6 x 4.1 cm. 3. Anterior hypoechoic area, fibroid vs other measuring 1.0 x 0.8 x 1.4 cm. Mital bahen Marlowe Sax, RDMS The ultrasound images and findings were reviewed by me and I agree with the above report. Prentice Docker,  MD, [facility], [facility] 11/06/2017 11:57 AM    Assessment: 47 y.o. G0P0000  1. Dysmenorrhea   2. Intramural and subserous leiomyoma of uterus   3. Menorrhagia with irregular cycle   4. Endometriosis    Plan: Problem List Items Addressed This Visit      Genitourinary   Dysmenorrhea - Primary   Relevant Medications   norethindrone (AYGESTIN) 5 MG tablet   Other Relevant Orders   US PELVIS TRANSVANGINAL NON-OB (TV ONLY)   Fibroid uterus   Relevant Orders   US PELVIS TRANSVANGINAL NON-OB (TV ONLY)     Other   Menorrhagia with irregular cycle   Relevant Medications   norethindrone (AYGESTIN) 5 MG tablet   Other Relevant Orders   US PELVIS TRANSVANGINAL NON-OB (TV ONLY)   Endometriosis   Relevant Medications   norethindrone (AYGESTIN) 5 MG tablet   Other Relevant Orders   US PELVIS TRANSVANGINAL NON-OB (TV ONLY)     Very long discussion regarding the combination of issues the patient has and best path forward to optimize treatment.  Discussed observation with no treatment at this time to see if symptoms resolve. We also discussed medication management with progesterone-only pill or levonorgestrel IUD.  We discussed the risk that a progesterone-based medication has some risk of increasing the size of the fibroid.  Discussed surgery with hysterectomy with likely retention of the right ovary if at all possible.  The risks and benefits and all trade-offs were discussed.  Plan at end of discussion is to start patient on norethindrone and assess response. Will monitor size of fibroid while on this medication.  If this is effective, she may consider switching to the IUD. If not, she may consider hysterectomy. Discussed optimizing her health as much as possible in the mean time, as this will be good for her in general and will reduce risk  of surgery as much as possible, should that be necessary.  25 minutes spent in face to face discussion with > 50% spent in  counseling,management, and coordination of care of her dysmenorrhea, fibroid uterus, menorrhagia with irregular cycle, and endometriosis.   Return in about 3 months (around 02/05/2018) for Schedule pelvic u/s and follow up Dr. Glennon Mac.  Prentice Docker, MD, [facility], [facility] 11/06/2017 1:22 PM

## 2017-12-20 ENCOUNTER — Ambulatory Visit (INDEPENDENT_AMBULATORY_CARE_PROVIDER_SITE_OTHER): Payer: BLUE CROSS/BLUE SHIELD

## 2017-12-20 DIAGNOSIS — Z23 Encounter for immunization: Secondary | ICD-10-CM | POA: Diagnosis not present

## 2017-12-24 ENCOUNTER — Ambulatory Visit (INDEPENDENT_AMBULATORY_CARE_PROVIDER_SITE_OTHER): Payer: BLUE CROSS/BLUE SHIELD | Admitting: Family Medicine

## 2017-12-24 ENCOUNTER — Encounter: Payer: Self-pay | Admitting: Family Medicine

## 2017-12-24 VITALS — BP 112/74 | HR 85 | Temp 97.9°F | Resp 16 | Ht 62.0 in | Wt 272.0 lb

## 2017-12-24 DIAGNOSIS — R809 Proteinuria, unspecified: Secondary | ICD-10-CM

## 2017-12-24 DIAGNOSIS — E1169 Type 2 diabetes mellitus with other specified complication: Secondary | ICD-10-CM | POA: Diagnosis not present

## 2017-12-24 DIAGNOSIS — E1129 Type 2 diabetes mellitus with other diabetic kidney complication: Secondary | ICD-10-CM | POA: Diagnosis not present

## 2017-12-24 DIAGNOSIS — E785 Hyperlipidemia, unspecified: Secondary | ICD-10-CM

## 2017-12-24 DIAGNOSIS — M5137 Other intervertebral disc degeneration, lumbosacral region: Secondary | ICD-10-CM | POA: Diagnosis not present

## 2017-12-24 DIAGNOSIS — N809 Endometriosis, unspecified: Secondary | ICD-10-CM

## 2017-12-24 MED ORDER — DAPAGLIFLOZIN PRO-METFORMIN ER 10-1000 MG PO TB24: 1.0000 | ORAL_TABLET | Freq: Every day | ORAL | 0 refills | Status: DC

## 2017-12-24 NOTE — Progress Notes (Signed)
Name: Kelsey Alvarez   MRN: [id number]    DOB: 1970/12/06   Date:12/24/2017       Progress Note  Subjective  Chief Complaint  Chief Complaint  Patient presents with  . Medication Management    patient stated that the Ozempic is making her sick. Nausea and no appetite.    HPI  DMII: last hgbA1C was 6.9%, she states she stopped taking Ozempic because of inability to eat secondary to nausea and she would to try something else. She lost a few pounds since last visit. She denies polyphagia, but she has episodes of polydipsia and polyuria. Discussed options and we will try Xigduo, she is aware of possible side effects, including indigestion and also UTI and yeast vaginitis  Chronic low back pain  and endometriosis: she states recently seen by GYN, Dr. Glennon Mac, and since intensity of low back pain varies throughout the month. LMP was end of August. She was given norethindrone but she has not filled it yet. She may proceed with surgical intervention and hysterectomy to see if pain will improve. She states he would like for her DM to improve.    Patient Active Problem List   Diagnosis Date Noted  . Endometriosis 11/06/2017  . Menorrhagia with irregular cycle 10/28/2017  . Fibroid uterus 10/28/2017  . Carpal tunnel syndrome on both sides 10/17/2017  . B12 deficiency 04/27/2016  . History of endometriosis 11/17/2015  . Oligomenorrhea 10/12/2015  . Benign essential HTN 12/28/2014  . Cervical polyp 12/28/2014  . Controlled type 2 diabetes mellitus with microalbuminuria ([place]) 12/28/2014  . Dyslipidemia 12/28/2014  . Dysmenorrhea 12/28/2014  . Edema 12/28/2014  . Female infertility 12/28/2014  . Gastro-esophageal reflux disease without esophagitis 12/28/2014  . Morbid obesity ([place]) 12/28/2014  . Asthma, moderate persistent, poorly-controlled 12/28/2014  . Perennial allergic rhinitis 12/28/2014  . Intermittent tremor 12/28/2014  . Vitamin D deficiency 12/28/2014  . LBP (low back pain)  12/28/2014  . Left sciatic nerve pain 12/28/2014    Past Surgical History:  Procedure Laterality Date  . APPENDECTOMY    . cervical polyp removal    . DILATION AND CURETTAGE OF UTERUS    . HYSTEROSCOPY    . HYSTEROSCOPY W/D&C N/A 10/12/2015   Procedure: DILATATION AND CURETTAGE /HYSTEROSCOPY;  Surgeon: Will Bonnet, MD;  Location: [facility] OR[facility];  Service: Gynecology;  Laterality: N/A;  . LAPAROSCOPIC BILATERAL SALPINGO OOPHERECTOMY N/A 10/12/2015   Procedure: LAPAROSCOPIC RIGHT SALPINGECTOMY, LEFT OOPHORECTOMY;  Surgeon: Will Bonnet, MD;  Location: [facility];  Service: Gynecology;  Laterality: N/A;  . LAPAROSCOPIC OVARIAN CYSTECTOMY Bilateral 10/12/2015   Procedure: LAPAROSCOPIC OVARIAN CYSTECTOMY;  Surgeon: Will Bonnet, MD;  Location: [facility];  Service: Gynecology;  Laterality: Bilateral;  . OOPHORECTOMY    . OVARIAN CYST REMOVAL      Family History  Problem Relation Age of Onset  . Hypertension Mother   . Diabetes Mother   . CAD Mother   . Cancer Mother        cervical  . Heart attack Mother 34       Triple Bypass  . CAD Father   . Hypertension Father   . Arthritis Father   . Thyroid disease Sister   . Alzheimer's disease Maternal Grandmother   . Alzheimer's disease Maternal Grandfather   . Breast cancer Paternal Grandmother     Social History   Socioeconomic History  . Marital status: Married    Spouse name: Kasandra Knudsen  . Number of children: 0  . Years of  education: Not on file  . Highest education level: Some college, no degree  Occupational History  . Occupation: Museum/gallery curator  Social Needs  . Financial resource strain: Not hard at all  . Food insecurity:    Worry: Never true    Inability: Never true  . Transportation needs:    Medical: No    Non-medical: No  Tobacco Use  . Smoking status: Former Smoker    Years: 1.00    Types: Cigarettes    Start date: 12/03/2001    Last attempt to quit: 12/28/2002    Years since quitting: 15.0  . Smokeless tobacco:  Never Used  . Tobacco comment: smoked 3 to 4 cigarrettes a day for a year  Substance and Sexual Activity  . Alcohol use: Yes    Alcohol/week: 0.0 standard drinks    Comment: occasionally drinks margarita, once or twice a year  . Drug use: No  . Sexual activity: Yes    Partners: Male    Comment: Partial Hysterectomy  Lifestyle  . Physical activity:    Days per week: 1 day    Minutes per session: 30 min  . Stress: Not at all  Relationships  . Social connections:    Talks on phone: Three times a week    Gets together: Never    Attends religious service: More than 4 times per year    Active member of club or organization: No    Attends meetings of clubs or organizations: Never    Relationship status: Married  . Intimate partner violence:    Fear of current or ex partner: No    Emotionally abused: No    Physically abused: No    Forced sexual activity: No  Other Topics Concern  . Not on file  Social History Narrative  . Not on file     Current Outpatient Medications:  .  Alpha Lipoic Acid 200 MG CAPS, Take by mouth., Disp: , Rfl:  .  aspirin EC 81 MG tablet, Take 1 tablet (81 mg total) by mouth daily., Disp: 30 tablet, Rfl: 0 .  atorvastatin (LIPITOR) 40 MG tablet, Take 1 tablet (40 mg total) by mouth daily., Disp: 90 tablet, Rfl: 1 .  budesonide-formoterol (SYMBICORT) 160-4.5 MCG/ACT inhaler, Inhale 2 puffs into the lungs 2 (two) times daily., Disp: 1 Inhaler, Rfl: 2 .  Cholecalciferol (VITAMIN D) 2000 UNITS tablet, Take 1 tablet by mouth daily., Disp: , Rfl:  .  fluticasone (FLONASE) 50 MCG/ACT nasal spray, Place 2 sprays into both nostrils daily., Disp: 16 g, Rfl: 5 .  gabapentin (NEURONTIN) 100 MG capsule, Take 1 capsule by mouth 2 (two) times daily., Disp: , Rfl:  .  gabapentin (NEURONTIN) 300 MG capsule, Take 1 capsule (300 mg total) by mouth 3 (three) times daily. (Patient taking differently: Take 300 mg by mouth daily. ), Disp: 90 capsule, Rfl: 2 .  IBUPROFEN PO, Take by  mouth as needed., Disp: , Rfl:  .  ipratropium-albuterol (DUONEB) 0.5-2.5 (3) MG/3ML SOLN, Take 3 mLs every 4 (four) hours as needed by nebulization., Disp: 360 mL, Rfl: 0 .  loratadine (CLARITIN) 10 MG tablet, Take 10 mg by mouth daily., Disp: , Rfl:  .  montelukast (SINGULAIR) 10 MG tablet, Take 1 tablet (10 mg total) by mouth at bedtime., Disp: 90 tablet, Rfl: 1 .  norethindrone (AYGESTIN) 5 MG tablet, Take 1 tablet (5 mg total) by mouth daily., Disp: 90 tablet, Rfl: 1 .  olmesartan-hydrochlorothiazide (BENICAR HCT) 20-12.5 MG tablet, Take  1 tablet by mouth daily., Disp: 90 tablet, Rfl: 0 .  omeprazole (PRILOSEC) 10 MG capsule, Take 1 capsule (10 mg total) by mouth daily., Disp: 30 capsule, Rfl: 0 .  PROAIR HFA 108 (90 Base) MCG/ACT inhaler, INHALE 2 PUFFS BY MOUTH EVERY 6 HOURS AS NEEDED FOR WHEEZING OR  SHORTNESS  OF  BREATH, Disp: 9 each, Rfl: 0 .  Dapagliflozin-metFORMIN HCl ER (XIGDUO XR) 12-998 MG TB24, Take 1 tablet by mouth daily., Disp: 30 tablet, Rfl: 0  Allergies  Allergen Reactions  . Fish Allergy Shortness Of Breath  . Penicillins Anaphylaxis and Rash    Childhood allergy  . Adhesive [Tape] Rash    Paper tape ok to use.    I personally reviewed active problem list, medication list, allergies, family history, social history with the patient/caregiver today.   ROS  Constitutional: Negative for fever or weight change.  Respiratory: positive  For intermittent  cough and shortness of breath.   Cardiovascular: Negative for chest pain or palpitations.  Gastrointestinal: Negative for abdominal pain, no bowel changes.  Musculoskeletal: Positive  For intermittent  gait problem but no  joint swelling.  Skin: Negative for rash.  Neurological: Negative for dizziness or headache.  No other specific complaints in a complete review of systems (except as listed in HPI above).  Objective  Vitals:   12/24/17 0839  BP: 112/74  Pulse: 85  Resp: 16  Temp: 97.9 F (36.6 C)   TempSrc: Oral  SpO2: 98%  Weight: 272 lb (123.4 kg)  Height: 5\' 2"  (1.575 m)    Body mass index is 49.75 kg/m.  Physical Exam  Constitutional: Patient appears well-developed and well-nourished. Obese  No distress.  HEENT: head atraumatic, normocephalic, pupils equal and reactive to light, neck supple, throat within normal limits Cardiovascular: Normal rate, regular rhythm and normal heart sounds.  No murmur heard. No BLE edema. Pulmonary/Chest: Effort normal and breath sounds normal. No respiratory distress. Abdominal: Soft.  There is no tenderness. Muscular Skeletal: tender to palpation of lower spine, negative straight leg raise Psychiatric: Patient has a normal mood and affect. behavior is normal. Judgment and thought content normal.  Recent Results (from the past 2160 hour(s))  POCT UA - Microalbumin     Status: Normal   Collection Time: 10/17/17 10:14 AM  Result Value Ref Range   Microalbumin Ur, POC 20 mg/L   Creatinine, POC     Albumin/Creatinine Ratio, Urine, POC    POCT HgB A1C     Status: Abnormal   Collection Time: 10/17/17 10:24 AM  Result Value Ref Range   Hemoglobin A1C 6.9 (A) 4.0 - 5.6 %   HbA1c POC (<> result, manual entry)     HbA1c, POC (prediabetic range)     HbA1c, POC (controlled diabetic range)    HM DIABETES EYE EXAM     Status: None   Collection Time: 10/23/17 12:00 AM  Result Value Ref Range   HM Diabetic Eye Exam No Retinopathy No Retinopathy    Comment: [facility], Dr. Benay Pillow     PHQ2/9: Depression screen [facility] 2/9 12/24/2017 10/17/2017 07/17/2017 10/09/2016 06/26/2016  Decreased Interest 0 2 0 0 0  Down, Depressed, Hopeless 0 2 0 0 0  PHQ - 2 Score 0 4 0 0 0  Altered sleeping 1 1 - - -  Tired, decreased energy 0 1 - - -  Change in appetite 3 0 - - -  Feeling bad or failure about yourself  0 1 - - -  Trouble concentrating 0 2 - - -  Moving slowly or fidgety/restless 0 0 - - -  Suicidal thoughts 0 0 - - -  PHQ-9 Score 4 9 - - -   Difficult doing work/chores Not difficult at all Not difficult at all - - -     Fall Risk: Fall Risk  12/24/2017 10/17/2017 08/23/2017 07/17/2017 04/23/2017  Falls in the past year? No No No No No     Functional Status Survey: Is the patient deaf or have difficulty hearing?: No Does the patient have difficulty seeing, even when wearing glasses/contacts?: No Does the patient have difficulty concentrating, remembering, or making decisions?: No Does the patient have difficulty walking or climbing stairs?: No Does the patient have difficulty dressing or bathing?: No Does the patient have difficulty doing errands alone such as visiting a doctor's office or shopping?: No    Assessment & Plan  1. Controlled type 2 diabetes mellitus with microalbuminuria, without long-term current use of insulin (HCC)  Change medications  2. Dyslipidemia associated with type 2 diabetes mellitus (HCC)  - Lipid panel - COMPLETE METABOLIC PANEL WITH GFR - Dapagliflozin-metFORMIN HCl ER (XIGDUO XR) 12-998 MG TB24; Take 1 tablet by mouth daily.  Dispense: 30 tablet; Refill: 0  3. Endometriosis  She came in with questions, explained that surgery could be helpful but not guaranteed that would make pain go away  4. DDD (degenerative disc disease), lumbosacral  She is worried about work, she tried going down to 32 hours, but is back to 40 hours per week, FMLA was filled but her job did not accept lower load.

## 2017-12-25 ENCOUNTER — Other Ambulatory Visit: Payer: Self-pay | Admitting: Family Medicine

## 2017-12-25 DIAGNOSIS — R748 Abnormal levels of other serum enzymes: Secondary | ICD-10-CM

## 2017-12-25 LAB — COMPLETE METABOLIC PANEL WITH GFR
AG Ratio: 1.4 (calc) (ref 1.0–2.5)
ALBUMIN MSPROF: 4.2 g/dL (ref 3.6–5.1)
ALT: 18 U/L (ref 6–29)
AST: 14 U/L (ref 10–30)
Alkaline phosphatase (APISO): 360 U/L — ABNORMAL HIGH (ref 33–115)
BUN: 11 mg/dL (ref 7–25)
CALCIUM: 9.6 mg/dL (ref 8.6–10.2)
CO2: 27 mmol/L (ref 20–32)
CREATININE: 0.7 mg/dL (ref 0.50–1.10)
Chloride: 101 mmol/L (ref 98–110)
GFR, EST NON AFRICAN AMERICAN: 105 mL/min/{1.73_m2} (ref >=60)
GFR, Est African American: 122 mL/min/{1.73_m2} (ref >=60)
GLOBULIN: 2.9 g/dL (ref 1.9–3.7)
Glucose, Bld: 111 mg/dL — ABNORMAL HIGH (ref 65–99)
Potassium: 4.2 mmol/L (ref 3.5–5.3)
SODIUM: 139 mmol/L (ref 135–146)
Total Bilirubin: 0.5 mg/dL (ref 0.2–1.2)
Total Protein: 7.1 g/dL (ref 6.1–8.1)

## 2017-12-25 LAB — LIPID PANEL
CHOLESTEROL: 160 mg/dL (ref <200)
HDL: 39 mg/dL — ABNORMAL LOW (ref >50)
LDL Cholesterol (Calc): 93 mg/dL (calc)
Non-HDL Cholesterol (Calc): 121 mg/dL (calc) (ref <130)
Total CHOL/HDL Ratio: 4.1 (calc) (ref <5.0)
Triglycerides: 181 mg/dL — ABNORMAL HIGH (ref <150)

## 2017-12-31 ENCOUNTER — Ambulatory Visit
Admission: RE | Admit: 2017-12-31 | Discharge: 2017-12-31 | Disposition: A | Discharge status: Home / Self Care | Payer: BLUE CROSS/BLUE SHIELD | Source: Ambulatory Visit | Attending: Family Medicine | Admitting: Family Medicine

## 2017-12-31 DIAGNOSIS — R748 Abnormal levels of other serum enzymes: Secondary | ICD-10-CM | POA: Diagnosis not present

## 2017-12-31 DIAGNOSIS — R110 Nausea: Secondary | ICD-10-CM | POA: Insufficient documentation

## 2018-01-01 LAB — HEPATIC FUNCTION PANEL
AG RATIO: 1.5 (calc) (ref 1.0–2.5)
ALKALINE PHOSPHATASE (APISO): 210 U/L — AB (ref 33–115)
ALT: 18 U/L (ref 6–29)
AST: 17 U/L (ref 10–30)
Albumin: 4.1 g/dL (ref 3.6–5.1)
BILIRUBIN DIRECT: 0.1 mg/dL (ref 0.0–0.2)
BILIRUBIN INDIRECT: 0.3 mg/dL (ref 0.2–1.2)
BILIRUBIN TOTAL: 0.4 mg/dL (ref 0.2–1.2)
GLOBULIN: 2.7 g/dL (ref 1.9–3.7)
TOTAL PROTEIN: 6.8 g/dL (ref 6.1–8.1)

## 2018-01-01 LAB — LIPASE: Lipase: 29 U/L (ref 7–60)

## 2018-01-19 ENCOUNTER — Emergency Department: Payer: BLUE CROSS/BLUE SHIELD

## 2018-01-19 ENCOUNTER — Encounter: Payer: Self-pay | Admitting: Intensive Care

## 2018-01-19 ENCOUNTER — Other Ambulatory Visit: Payer: Self-pay

## 2018-01-19 ENCOUNTER — Emergency Department
Admission: EM | Admit: 2018-01-19 | Discharge: 2018-01-19 | Disposition: A | Discharge status: Home / Self Care | Payer: BLUE CROSS/BLUE SHIELD | Attending: Emergency Medicine | Admitting: Emergency Medicine

## 2018-01-19 DIAGNOSIS — M75101 Unspecified rotator cuff tear or rupture of right shoulder, not specified as traumatic: Secondary | ICD-10-CM | POA: Insufficient documentation

## 2018-01-19 DIAGNOSIS — Z87891 Personal history of nicotine dependence: Secondary | ICD-10-CM | POA: Insufficient documentation

## 2018-01-19 DIAGNOSIS — M25511 Pain in right shoulder: Secondary | ICD-10-CM | POA: Diagnosis present

## 2018-01-19 DIAGNOSIS — Z7982 Long term (current) use of aspirin: Secondary | ICD-10-CM | POA: Insufficient documentation

## 2018-01-19 DIAGNOSIS — M7581 Other shoulder lesions, right shoulder: Secondary | ICD-10-CM

## 2018-01-19 DIAGNOSIS — E119 Type 2 diabetes mellitus without complications: Secondary | ICD-10-CM | POA: Diagnosis not present

## 2018-01-19 DIAGNOSIS — J45909 Unspecified asthma, uncomplicated: Secondary | ICD-10-CM | POA: Insufficient documentation

## 2018-01-19 DIAGNOSIS — M62838 Other muscle spasm: Secondary | ICD-10-CM | POA: Insufficient documentation

## 2018-01-19 DIAGNOSIS — Z79899 Other long term (current) drug therapy: Secondary | ICD-10-CM | POA: Diagnosis not present

## 2018-01-19 DIAGNOSIS — I10 Essential (primary) hypertension: Secondary | ICD-10-CM | POA: Diagnosis not present

## 2018-01-19 MED ORDER — METHOCARBAMOL 500 MG PO TABS: 500.0000 mg | ORAL_TABLET | Freq: Three times a day (TID) | ORAL | 0 refills | Status: AC | PRN

## 2018-01-19 MED ORDER — MELOXICAM 15 MG PO TABS: 15.0000 mg | ORAL_TABLET | Freq: Every day | ORAL | 1 refills | Status: AC

## 2018-01-19 NOTE — ED Triage Notes (Addendum)
Patient c/o R shoulder pain with limited movement since Thursday. Sometimes pain radiates down to hand. No relief with advil or ice/heat

## 2018-01-19 NOTE — ED Provider Notes (Signed)
[facility] Emergency Department Provider Note  ____________________________________________  Time seen: Approximately 6:11 PM  I have reviewed the triage vital signs and the nursing notes.   HISTORY  Chief Complaint Shoulder Pain (right)    HPI Kelsey Alvarez is a 47 y.o. female presents to the emergency department with right shoulder pain for the past 3 days.  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  Patient reports that it is difficult for her to raise her right arm or to move her arm behind her.  Patient denies falls or mechanisms of trauma.  Patient denies reproducibility of symptoms with range of motion at the neck.  Patient does have a history of carpal tunnel syndrome and reports that this pain feels distinctively different.  Pain is described as an aching sensation that primarily stays along the lateral shoulder.  Patient does report that she has tenderness along the right lateral neck and along the right upper trapezius.  Patient has taken ibuprofen at home which eases her pain some.   Past Medical History:  Diagnosis Date  . Abnormal CBC   . Allergy   . Arthritis    left knee  . Asthma   . Cervical polyp   . Chronic foot pain   . Complication of anesthesia    takes longer to wake up.  . Contact dermatitis   . Diabetes mellitus without complication ([place])   . Dysmenorrhea   . Edema   . 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 GERD (gastroesophageal reflux disease)   . Headache    history of migraines  . Hyperlipidemia   . Hypertension   . Lumbago   . Neuropathy   . Obesity   . Occasional tremors     Patient Active Problem List   Diagnosis Date Noted  . Endometriosis 11/06/2017  . Menorrhagia with irregular cycle 10/28/2017  . Fibroid uterus 10/28/2017  . Carpal tunnel syndrome on both sides 10/17/2017  . B12 deficiency 04/27/2016  . History of endometriosis  11/17/2015  . Oligomenorrhea 10/12/2015  . Benign essential HTN 12/28/2014  . Cervical polyp 12/28/2014  . Controlled type 2 diabetes mellitus with microalbuminuria ([place]) 12/28/2014  . Dyslipidemia 12/28/2014  . Dysmenorrhea 12/28/2014  . Edema 12/28/2014  . Female infertility 12/28/2014  . Gastro-esophageal reflux disease without esophagitis 12/28/2014  . Morbid obesity ([place]) 12/28/2014  . Asthma, moderate persistent, poorly-controlled 12/28/2014  . Perennial allergic rhinitis 12/28/2014  . Intermittent tremor 12/28/2014  . Vitamin D deficiency 12/28/2014  . LBP (low back pain) 12/28/2014  . Left sciatic nerve pain 12/28/2014    Past Surgical History:  Procedure Laterality Date  . APPENDECTOMY    . cervical polyp removal    . DILATION AND CURETTAGE OF UTERUS    . HYSTEROSCOPY    . HYSTEROSCOPY W/D&C N/A 10/12/2015   Procedure: DILATATION AND CURETTAGE /HYSTEROSCOPY;  Surgeon: Will Bonnet, MD;  Location: [facility] ORS;  Service: Gynecology;  Laterality: N/A;  . LAPAROSCOPIC BILATERAL SALPINGO OOPHERECTOMY N/A 10/12/2015   Procedure: LAPAROSCOPIC RIGHT SALPINGECTOMY, LEFT OOPHORECTOMY;  Surgeon: Will Bonnet, MD;  Location: [facility] ORS;  Service: Gynecology;  Laterality: N/A;  . LAPAROSCOPIC OVARIAN CYSTECTOMY Bilateral 10/12/2015   Procedure: LAPAROSCOPIC OVARIAN CYSTECTOMY;  Surgeon: Will Bonnet, MD;  Location: [facility] ORS;  Service: Gynecology;  Laterality: Bilateral;  . OOPHORECTOMY    . OVARIAN CYST REMOVAL      Prior to Admission medications   Medication  Sig Start Date End Date Taking? Authorizing Provider  Alpha Lipoic Acid 200 MG CAPS Take by mouth.    [provider]  aspirin EC 81 MG tablet Take 1 tablet (81 mg total) by mouth daily. 10/09/16   Steele Sizer, MD  atorvastatin (LIPITOR) 40 MG tablet Take 1 tablet (40 mg total) by mouth daily. 04/23/17   Steele Sizer, MD  budesonide-formoterol (SYMBICORT) 160-4.5 MCG/ACT inhaler Inhale 2 puffs into the lungs  2 (two) times daily. 10/17/17   Steele Sizer, MD  Cholecalciferol (VITAMIN D) 2000 UNITS tablet Take 1 tablet by mouth daily. 08/23/10   [provider]  Dapagliflozin-metFORMIN HCl ER (XIGDUO XR) 12-998 MG TB24 Take 1 tablet by mouth daily. 12/24/17   Steele Sizer, MD  fluticasone (FLONASE) 50 MCG/ACT nasal spray Place 2 sprays into both nostrils daily. 10/17/17   Steele Sizer, MD  gabapentin (NEURONTIN) 100 MG capsule Take 1 capsule by mouth 2 (two) times daily. 10/15/17   Lang Snow, NP  gabapentin (NEURONTIN) 300 MG capsule Take 1 capsule (300 mg total) by mouth 3 (three) times daily. Patient taking differently: Take 300 mg by mouth daily.  06/24/17 06/24/18  Cuthriell, Charline Bills, PA-C  IBUPROFEN PO Take by mouth as needed.    [provider]  ipratropium-albuterol (DUONEB) 0.5-2.5 (3) MG/3ML SOLN Take 3 mLs every 4 (four) hours as needed by nebulization. 01/09/17   Steele Sizer, MD  loratadine (CLARITIN) 10 MG tablet Take 10 mg by mouth daily.    [provider]  meloxicam (MOBIC) 15 MG tablet Take 1 tablet (15 mg total) by mouth daily for 7 days. 01/19/18 01/26/18  Lannie Fields, PA-C  methocarbamol (ROBAXIN) 500 MG tablet Take 1 tablet (500 mg total) by mouth 3 (three) times daily as needed for up to 5 days. 01/19/18 01/24/18  Vallarie Mare M, PA-C  montelukast (SINGULAIR) 10 MG tablet Take 1 tablet (10 mg total) by mouth at bedtime. 10/17/17   Steele Sizer, MD  norethindrone (AYGESTIN) 5 MG tablet Take 1 tablet (5 mg total) by mouth daily. 11/06/17   Will Bonnet, MD  olmesartan-hydrochlorothiazide (BENICAR HCT) 20-12.5 MG tablet Take 1 tablet by mouth daily. 10/17/17   Steele Sizer, MD  omeprazole (PRILOSEC) 10 MG capsule Take 1 capsule (10 mg total) by mouth daily. 06/24/17   Cuthriell, Charline Bills, PA-C  PROAIR HFA 108 (90 Base) MCG/ACT inhaler INHALE 2 PUFFS BY MOUTH EVERY 6 HOURS AS NEEDED FOR WHEEZING OR  SHORTNESS  OF  BREATH  10/17/17   Steele Sizer, MD    Allergies Fish allergy; Penicillins; and Adhesive [tape]  Family History  Problem Relation Age of Onset  . Hypertension Mother   . Diabetes Mother   . CAD Mother   . Cancer Mother        cervical  . Heart attack Mother 82       Triple Bypass  . CAD Father   . Hypertension Father   . Arthritis Father   . Thyroid disease Sister   . Alzheimer's disease Maternal Grandmother   . Alzheimer's disease Maternal Grandfather   . Breast cancer Paternal Grandmother     Social History Social History   Tobacco Use  . Smoking status: Former Smoker    Years: 1.00    Types: Cigarettes    Start date: 12/03/2001    Last attempt to quit: 12/28/2002    Years since quitting: 15.0  . Smokeless tobacco: Never Used  . Tobacco comment: smoked 3  to 4 cigarrettes a day for a year  Substance Use Topics  . Alcohol use: Yes    Alcohol/week: 0.0 standard drinks    Comment: occasionally drinks margarita, once or twice a year  . Drug use: No     Review of Systems  Constitutional: No fever/chills Eyes: No visual changes. No discharge ENT: No upper respiratory complaints. Cardiovascular: no chest pain. Respiratory: no cough. No SOB. Gastrointestinal: No abdominal pain.  No nausea, no vomiting.  No diarrhea.  No constipation. Musculoskeletal: Patient has right shoulder pain. Skin: Negative for rash, abrasions, lacerations, ecchymosis. Neurological: Negative for headaches, focal weakness or numbness.   ____________________________________________   PHYSICAL EXAM:  VITAL SIGNS: ED Triage Vitals  Enc Vitals Group     BP 01/19/18 1432 (!) 142/74     Pulse Rate 01/19/18 1432 89     Resp 01/19/18 1432 16     Temp 01/19/18 1432 98.7 F (37.1 C)     Temp Source 01/19/18 1432 Oral     SpO2 01/19/18 1432 98 %     Weight 01/19/18 1433 245 lb (111.1 kg)     Height 01/19/18 1433 5\' 2"  (1.575 m)     Head Circumference --      Peak Flow --      Pain Score  01/19/18 1432 8     Pain Loc --      Pain Edu? --      Excl. in [place]? --      Constitutional: Alert and oriented. Well appearing and in no acute distress. Eyes: Conjunctivae are normal. PERRL. EOMI. Head: Atraumatic. Cardiovascular: Normal rate, regular rhythm. Normal S1 and S2.  Good peripheral circulation. Respiratory: Normal respiratory effort without tachypnea or retractions. Lungs CTAB. Good air entry to the bases with no decreased or absent breath sounds. Musculoskeletal: Patient has 5 out of 5 strength in the upper extremities.  Patient's pain is reproduced with abduction to 90 degrees.  Patient has tenderness to palpation along the lateral deltoid and right trapezius.  She has pain with right rotator cuff testing but no weakness.  Palpable radial pulse, right. Neurologic:  Normal speech and language. No gross focal neurologic deficits are appreciated.  Skin:  Skin is warm, dry and intact. No rash noted. Psychiatric: Mood and affect are normal. Speech and behavior are normal. Patient exhibits appropriate insight and judgement.   ____________________________________________   LABS (all labs ordered are listed, but only abnormal results are displayed)  Labs Reviewed - No data to display ____________________________________________  EKG   ____________________________________________  RADIOLOGY I personally viewed and evaluated these images as part of my medical decision making, as well as reviewing the written report by the radiologist.  Dg Cervical Spine 2-3 Views  Result Date: 01/19/2018 CLINICAL DATA:  Right shoulder pain. EXAM: CERVICAL SPINE - 2-3 VIEW COMPARISON:  None. FINDINGS: The lateral view is diagnostic to the T1 level. There is no acute fracture or subluxation. Vertebral body heights are preserved. Reversal of the normal cervical lordosis. Sagittal alignment is normal. Interveterbral disc spaces are maintained. Normal prevertebral soft tissues. IMPRESSION: No  acute osseous abnormality or significant degenerative changes. Electronically Signed   By: Titus Dubin M.D.   On: 01/19/2018 17:09   Dg Shoulder Right  Result Date: 01/19/2018 CLINICAL DATA:  Right shoulder pain EXAM: RIGHT SHOULDER - 2+ VIEW COMPARISON:  None. FINDINGS: No acute bony abnormality. Specifically, no fracture, subluxation, or dislocation. Joint space maintained. IMPRESSION: Negative. Electronically Signed   By: Lennette Bihari  [place] M.D.   On: 01/19/2018 17:07    ____________________________________________    PROCEDURES  Procedure(s) performed:    Procedures    Medications - No data to display   ____________________________________________   INITIAL IMPRESSION / ASSESSMENT AND PLAN / ED COURSE  Pertinent labs & imaging results that were available during my care of the patient were reviewed by me and considered in my medical decision making (see chart for details).  Review of the [place] CSRS was performed in accordance of the [place] prior to dispensing any controlled drugs.      Assessment and plan Rotator cuff tendinitis Patient presents to the emergency department with right shoulder pain after increased overhead activity.  Patient has pain with abduction of the right shoulder and pain with palpation over the lateral aspect of the deltoid.  I suspect rotator cuff tendinitis with associated muscle spasm of the right upper trapezius.  Patient was started on meloxicam and was also prescribed Robaxin.  Patient was cautioned not to take Robaxin before driving or operating heavy machinery.  Patient was also advised to ice shoulder nightly.  She was given a referral to orthopedics.  All patient questions were answered.  ____________________________________________  FINAL CLINICAL IMPRESSION(S) / ED DIAGNOSES  Final diagnoses:  Tendinitis of right rotator cuff  Muscle spasm      NEW MEDICATIONS STARTED DURING THIS VISIT:  ED Discharge Orders         Ordered     meloxicam (MOBIC) 15 MG tablet  Daily     01/19/18 1737    methocarbamol (ROBAXIN) 500 MG tablet  3 times daily PRN     01/19/18 1737              This chart was dictated using voice recognition software/Dragon. Despite best efforts to proofread, errors can occur which can change the meaning. Any change was purely unintentional.    Lannie Fields, PA-C 01/19/18 1815    Delman Kitten, MD 01/20/18 [phone]

## 2018-01-19 NOTE — Discharge Instructions (Signed)
Ice shoulder nightly.  Avoid heat. Start meloxicam.  Tylenol can be used with meloxicam but avoid ibuprofen or Aleve. Muscle relaxer, Robaxin, can be taken at night.  Please do not operate heavy machinery or drive a vehicle while taking Robaxin.

## 2018-01-20 ENCOUNTER — Ambulatory Visit (INDEPENDENT_AMBULATORY_CARE_PROVIDER_SITE_OTHER): Payer: BLUE CROSS/BLUE SHIELD | Admitting: Family Medicine

## 2018-01-20 ENCOUNTER — Encounter: Payer: Self-pay | Admitting: Family Medicine

## 2018-01-20 VITALS — BP 138/84 | HR 63 | Temp 98.5°F | Resp 16 | Ht 62.0 in | Wt 271.5 lb

## 2018-01-20 DIAGNOSIS — J4540 Moderate persistent asthma, uncomplicated: Secondary | ICD-10-CM | POA: Diagnosis not present

## 2018-01-20 DIAGNOSIS — E1129 Type 2 diabetes mellitus with other diabetic kidney complication: Secondary | ICD-10-CM

## 2018-01-20 DIAGNOSIS — E1169 Type 2 diabetes mellitus with other specified complication: Secondary | ICD-10-CM | POA: Diagnosis not present

## 2018-01-20 DIAGNOSIS — I10 Essential (primary) hypertension: Secondary | ICD-10-CM | POA: Diagnosis not present

## 2018-01-20 DIAGNOSIS — R748 Abnormal levels of other serum enzymes: Secondary | ICD-10-CM

## 2018-01-20 DIAGNOSIS — R809 Proteinuria, unspecified: Secondary | ICD-10-CM

## 2018-01-20 DIAGNOSIS — E785 Hyperlipidemia, unspecified: Secondary | ICD-10-CM

## 2018-01-20 DIAGNOSIS — E1142 Type 2 diabetes mellitus with diabetic polyneuropathy: Secondary | ICD-10-CM

## 2018-01-20 DIAGNOSIS — M7581 Other shoulder lesions, right shoulder: Secondary | ICD-10-CM

## 2018-01-20 DIAGNOSIS — M5137 Other intervertebral disc degeneration, lumbosacral region: Secondary | ICD-10-CM

## 2018-01-20 LAB — POCT GLYCOSYLATED HEMOGLOBIN (HGB A1C): HEMOGLOBIN A1C: 6.7 % — AB (ref 4.0–5.6)

## 2018-01-20 MED ORDER — IPRATROPIUM-ALBUTEROL 0.5-2.5 (3) MG/3ML IN SOLN: 3.0000 mL | RESPIRATORY_TRACT | 0 refills | Status: AC | PRN

## 2018-01-20 MED ORDER — PROAIR HFA 108 (90 BASE) MCG/ACT IN AERS: INHALATION_SPRAY | RESPIRATORY_TRACT | 0 refills | Status: DC

## 2018-01-20 MED ORDER — OLMESARTAN MEDOXOMIL-HCTZ 20-12.5 MG PO TABS: 1.0000 | ORAL_TABLET | Freq: Every day | ORAL | 1 refills | Status: DC

## 2018-01-20 MED ORDER — ATORVASTATIN CALCIUM 40 MG PO TABS: 40.0000 mg | ORAL_TABLET | Freq: Every day | ORAL | 1 refills | Status: DC

## 2018-01-20 MED ORDER — BUDESONIDE-FORMOTEROL FUMARATE 160-4.5 MCG/ACT IN AERO: 2.0000 | INHALATION_SPRAY | Freq: Two times a day (BID) | RESPIRATORY_TRACT | 1 refills | Status: DC

## 2018-01-20 MED ORDER — DAPAGLIFLOZIN PROPANEDIOL 10 MG PO TABS: 10.0000 mg | ORAL_TABLET | Freq: Every day | ORAL | 1 refills | Status: DC

## 2018-01-20 NOTE — Patient Instructions (Signed)
Shoulder Exercises Ask your health care provider which exercises are safe for you. Do exercises exactly as told by your health care provider and adjust them as directed. It is normal to feel mild stretching, pulling, tightness, or discomfort as you do these exercises, but you should stop right away if you feel sudden pain or your pain gets worse.Do not begin these exercises until told by your health care provider. RANGE OF MOTION EXERCISES These exercises warm up your muscles and joints and improve the movement and flexibility of your shoulder. These exercises also help to relieve pain, numbness, and tingling. These exercises involve stretching your injured shoulder directly. Exercise A: Pendulum  1. Stand near a wall or a surface that you can hold onto for balance. 2. Bend at the waist and let your left / right arm hang straight down. Use your other arm to support you. Keep your back straight and do not lock your knees. 3. Relax your left / right arm and shoulder muscles, and move your hips and your trunk so your left / right arm swings freely. Your arm should swing because of the motion of your body, not because you are using your arm or shoulder muscles. 4. Keep moving your body so your arm swings in the following directions, as told by your health care provider: ? Side to side. ? Forward and backward. ? In clockwise and counterclockwise circles. 5. Continue each motion for __________ seconds, or for as long as told by your health care provider. 6. Slowly return to the starting position. Repeat __________ times. Complete this exercise __________ times a day. Exercise B:Flexion, Standing  1. Stand and hold a broomstick, a cane, or a similar object. Place your hands a little more than shoulder-width apart on the object. Your left / right hand should be palm-up, and your other hand should be palm-down. 2. Keep your elbow straight and keep your shoulder muscles relaxed. Push the stick down with  your healthy arm to raise your left / right arm in front of your body, and then over your head until you feel a stretch in your shoulder. ? Avoid shrugging your shoulder while you raise your arm. Keep your shoulder blade tucked down toward the middle of your back. 3. Hold for __________ seconds. 4. Slowly return to the starting position. Repeat __________ times. Complete this exercise __________ times a day. Exercise C: Abduction, Standing 1. Stand and hold a broomstick, a cane, or a similar object. Place your hands a little more than shoulder-width apart on the object. Your left / right hand should be palm-up, and your other hand should be palm-down. 2. While keeping your elbow straight and your shoulder muscles relaxed, push the stick across your body toward your left / right side. Raise your left / right arm to the side of your body and then over your head until you feel a stretch in your shoulder. ? Do not raise your arm above shoulder height, unless your health care provider tells you to do that. ? Avoid shrugging your shoulder while you raise your arm. Keep your shoulder blade tucked down toward the middle of your back. 3. Hold for __________ seconds. 4. Slowly return to the starting position. Repeat __________ times. Complete this exercise __________ times a day. Exercise D:Internal Rotation  1. Place your left / right hand behind your back, palm-up. 2. Use your other hand to dangle an exercise band, a towel, or a similar object over your shoulder. Grasp the band with   your left / right hand so you are holding onto both ends. 3. Gently pull up on the band until you feel a stretch in the front of your left / right shoulder. ? Avoid shrugging your shoulder while you raise your arm. Keep your shoulder blade tucked down toward the middle of your back. 4. Hold for __________ seconds. 5. Release the stretch by letting go of the band and lowering your hands. Repeat __________ times. Complete  this exercise __________ times a day. STRETCHING EXERCISES These exercises warm up your muscles and joints and improve the movement and flexibility of your shoulder. These exercises also help to relieve pain, numbness, and tingling. These exercises are done using your healthy shoulder to help stretch the muscles of your injured shoulder. Exercise E: Corner Stretch (External Rotation and Abduction)  1. Stand in a doorway with one of your feet slightly in front of the other. This is called a staggered stance. If you cannot reach your forearms to the door frame, stand facing a corner of a room. 2. Choose one of the following positions as told by your health care provider: ? Place your hands and forearms on the door frame above your head. ? Place your hands and forearms on the door frame at the height of your head. ? Place your hands on the door frame at the height of your elbows. 3. Slowly move your weight onto your front foot until you feel a stretch across your chest and in the front of your shoulders. Keep your head and chest upright and keep your abdominal muscles tight. 4. Hold for __________ seconds. 5. To release the stretch, shift your weight to your back foot. Repeat __________ times. Complete this stretch __________ times a day. Exercise F:Extension, Standing 1. Stand and hold a broomstick, a cane, or a similar object behind your back. ? Your hands should be a little wider than shoulder-width apart. ? Your palms should face away from your back. 2. Keeping your elbows straight and keeping your shoulder muscles relaxed, move the stick away from your body until you feel a stretch in your shoulder. ? Avoid shrugging your shoulders while you move the stick. Keep your shoulder blade tucked down toward the middle of your back. 3. Hold for __________ seconds. 4. Slowly return to the starting position. Repeat __________ times. Complete this exercise __________ times a day. STRENGTHENING  EXERCISES These exercises build strength and endurance in your shoulder. Endurance is the ability to use your muscles for a long time, even after they get tired. Exercise G:External Rotation  1. Sit in a stable chair without armrests. 2. Secure an exercise band at elbow height on your left / right side. 3. Place a soft object, such as a folded towel or a small pillow, between your left / right upper arm and your body to move your elbow a few inches away (about 10 cm) from your side. 4. Hold the end of the band so it is tight and there is no slack. 5. Keeping your elbow pressed against the soft object, move your left / right forearm out, away from your abdomen. Keep your body steady so only your forearm moves. 6. Hold for __________ seconds. 7. Slowly return to the starting position. Repeat __________ times. Complete this exercise __________ times a day. Exercise H:Shoulder Abduction  1. Sit in a stable chair without armrests, or stand. 2. Hold a __________ weight in your left / right hand, or hold an exercise band with both hands.   3. Start with your arms straight down and your left / right palm facing in, toward your body. 4. Slowly lift your left / right hand out to your side. Do not lift your hand above shoulder height unless your health care provider tells you that this is safe. ? Keep your arms straight. ? Avoid shrugging your shoulder while you do this movement. Keep your shoulder blade tucked down toward the middle of your back. 5. Hold for __________ seconds. 6. Slowly lower your arm, and return to the starting position. Repeat __________ times. Complete this exercise __________ times a day. Exercise I:Shoulder Extension 1. Sit in a stable chair without armrests, or stand. 2. Secure an exercise band to a stable object in front of you where it is at shoulder height. 3. Hold one end of the exercise band in each hand. Your palms should face each other. 4. Straighten your elbows and  lift your hands up to shoulder height. 5. Step back, away from the secured end of the exercise band, until the band is tight and there is no slack. 6. Squeeze your shoulder blades together as you pull your hands down to the sides of your thighs. Stop when your hands are straight down by your sides. Do not let your hands go behind your body. 7. Hold for __________ seconds. 8. Slowly return to the starting position. Repeat __________ times. Complete this exercise __________ times a day. Exercise J:Standing Shoulder Row 1. Sit in a stable chair without armrests, or stand. 2. Secure an exercise band to a stable object in front of you so it is at waist height. 3. Hold one end of the exercise band in each hand. Your palms should be in a thumbs-up position. 4. Bend each of your elbows to an "L" shape (about 90 degrees) and keep your upper arms at your sides. 5. Step back until the band is tight and there is no slack. 6. Slowly pull your elbows back behind you. 7. Hold for __________ seconds. 8. Slowly return to the starting position. Repeat __________ times. Complete this exercise __________ times a day. Exercise K:Shoulder Press-Ups  1. Sit in a stable chair that has armrests. Sit upright, with your feet flat on the floor. 2. Put your hands on the armrests so your elbows are bent and your fingers are pointing forward. Your hands should be about even with the sides of your body. 3. Push down on the armrests and use your arms to lift yourself off of the chair. Straighten your elbows and lift yourself up as much as you comfortably can. ? Move your shoulder blades down, and avoid letting your shoulders move up toward your ears. ? Keep your feet on the ground. As you get stronger, your feet should support less of your body weight as you lift yourself up. 4. Hold for __________ seconds. 5. Slowly lower yourself back into the chair. Repeat __________ times. Complete this exercise __________ times a  day. Exercise L: Wall Push-Ups  1. Stand so you are facing a stable wall. Your feet should be about one arm-length away from the wall. 2. Lean forward and place your palms on the wall at shoulder height. 3. Keep your feet flat on the floor as you bend your elbows and lean forward toward the wall. 4. Hold for __________ seconds. 5. Straighten your elbows to push yourself back to the starting position. Repeat __________ times. Complete this exercise __________ times a day. This information is not intended to replace advice   given to you by your health care provider. Make sure you discuss any questions you have with your health care provider. Document Released: 01/03/2005 Document Revised: 11/14/2015 Document Reviewed: 10/31/2014 [employer] Patient Education  2018 [employer]  

## 2018-01-20 NOTE — Progress Notes (Signed)
Name: Kelsey Alvarez   MRN: [id number]    DOB: [birth year]   Date:01/20/2018       Progress Note  Subjective  Chief Complaint  Chief Complaint  Patient presents with  . Follow-up    3 mth f/u  . Diabetes  . Hypertension  . Hyperlipidemia  . Asthma  . DDD  . Medication Refill    symbicort & pro-air  . Back Pain    lower back pain  . Shoulder Pain    patient went to the ER and was told it was her rotator cuff    HPI  [id number] is slightly better than last time, she was switched from [place] to [place] on her last visit because unable to tolerate side effects, she took Xigduo for about one week but it also caused upset stomach so she stopped medication also. Discussed trying [place] only and she is willing to do that. She is on ARB for microalbuminuria and gabapentin for neuropathy. Tolerating medications well. Seen by Dr. Manuella Ghazi, also has carpal tunnel but wants to hold off on surgery.   Low back pain with radiculitis: went to [facility] in 08/2017, after that referred to Neurologist and had EMG that showed neuropathy, being treated with gabapentin, current pain level is 6/10. Discussed referral to pain clinic, but she wants to hold off , seen last night at [facility] for acute onset of right shoulder pain, and will seen Ortho  Morbid Obesity: weight is stable, off Ozempic, not consistently very active, discussed portion control and food choices today with her   Asthma:she has moderate asthma, usually daily SOB and dry cough that is worse this time of the year, Using symbicort once daily only, explained that it is indicated for BID use. Spirometry reviewed with patient today   Hyperlipidemia: she states she has been taking it daily now, and is tolerating it well  Elevation of alkaline phosphatase: we will recheck today and if not significantly better she will be referred to GI. She had loose stools and one episode of vomiting last week, she states if felt like a GI bug  HTN: continue  medication, no chest pain, dizziness  or palpitation.No side effects of medication. Compliant with medication Unchanged   Fibroid tumors: she has heavy bleeding during cycles, could not tolerate ocp, she has a follow up with Dr. Glennon Mac. Unchanged    Patient Active Problem List   Diagnosis Date Noted  . Endometriosis 11/06/2017  . Menorrhagia with irregular cycle 10/28/2017  . Fibroid uterus 10/28/2017  . Carpal tunnel syndrome on both sides 10/17/2017  . B12 deficiency 04/27/2016  . History of endometriosis 11/17/2015  . Oligomenorrhea 10/12/2015  . Benign essential HTN 12/28/2014  . Cervical polyp 12/28/2014  . Controlled type 2 diabetes mellitus with microalbuminuria ([place]) 12/28/2014  . Dyslipidemia 12/28/2014  . Dysmenorrhea 12/28/2014  . Edema 12/28/2014  . Female infertility 12/28/2014  . Gastro-esophageal reflux disease without esophagitis 12/28/2014  . Morbid obesity ([place]) 12/28/2014  . Asthma, moderate persistent, poorly-controlled 12/28/2014  . Perennial allergic rhinitis 12/28/2014  . Intermittent tremor 12/28/2014  . Vitamin D deficiency 12/28/2014  . LBP (low back pain) 12/28/2014  . Left sciatic nerve pain 12/28/2014    Past Surgical History:  Procedure Laterality Date  . APPENDECTOMY    . cervical polyp removal    . DILATION AND CURETTAGE OF UTERUS    . HYSTEROSCOPY    . HYSTEROSCOPY W/D&C N/A 10/12/2015   Procedure: DILATATION AND CURETTAGE /HYSTEROSCOPY;  Surgeon: Estill Bamberg  Glennon Mac, MD;  Location: [facility] ORS;  Service: Gynecology;  Laterality: N/A;  . LAPAROSCOPIC BILATERAL SALPINGO OOPHERECTOMY N/A 10/12/2015   Procedure: LAPAROSCOPIC RIGHT SALPINGECTOMY, LEFT OOPHORECTOMY;  Surgeon: Will Bonnet, MD;  Location: [facility] ORS;  Service: Gynecology;  Laterality: N/A;  . LAPAROSCOPIC OVARIAN CYSTECTOMY Bilateral 10/12/2015   Procedure: LAPAROSCOPIC OVARIAN CYSTECTOMY;  Surgeon: Will Bonnet, MD;  Location: [facility] ORS;  Service: Gynecology;  Laterality:  Bilateral;  . OOPHORECTOMY    . OVARIAN CYST REMOVAL      Family History  Problem Relation Age of Onset  . Hypertension Mother   . Diabetes Mother   . CAD Mother   . Cancer Mother        cervical  . Heart attack Mother 13       Triple Bypass  . CAD Father   . Hypertension Father   . Arthritis Father   . Thyroid disease Sister   . Alzheimer's disease Maternal Grandmother   . Alzheimer's disease Maternal Grandfather   . Breast cancer Paternal Grandmother     Social History   Socioeconomic History  . Marital status: Married    Spouse name: Kasandra Knudsen  . Number of children: 0  . Years of education: Not on file  . Highest education level: Some college, no degree  Occupational History  . Occupation: Museum/gallery curator  Social Needs  . Financial resource strain: Not hard at all  . Food insecurity:    Worry: Never true    Inability: Never true  . Transportation needs:    Medical: No    Non-medical: No  Tobacco Use  . Smoking status: Former Smoker    Years: 1.00    Types: Cigarettes    Start date: 12/03/2001    Last attempt to quit: 12/28/2002    Years since quitting: 15.0  . Smokeless tobacco: Never Used  . Tobacco comment: smoked 3 to 4 cigarrettes a day for a year  Substance and Sexual Activity  . Alcohol use: Yes    Alcohol/week: 0.0 standard drinks    Comment: occasionally drinks margarita, once or twice a year  . Drug use: No  . Sexual activity: Yes    Partners: Male    Comment: Partial Hysterectomy  Lifestyle  . Physical activity:    Days per week: 1 day    Minutes per session: 30 min  . Stress: Not at all  Relationships  . Social connections:    Talks on phone: Three times a week    Gets together: Never    Attends religious service: More than 4 times per year    Active member of club or organization: No    Attends meetings of clubs or organizations: Never    Relationship status: Married  . Intimate partner violence:    Fear of current or ex partner: No     Emotionally abused: No    Physically abused: No    Forced sexual activity: No  Other Topics Concern  . Not on file  Social History Narrative  . Not on file     Current Outpatient Medications:  .  Alpha Lipoic Acid 200 MG CAPS, Take by mouth., Disp: , Rfl:  .  aspirin EC 81 MG tablet, Take 1 tablet (81 mg total) by mouth daily., Disp: 30 tablet, Rfl: 0 .  atorvastatin (LIPITOR) 40 MG tablet, Take 1 tablet (40 mg total) by mouth daily., Disp: 90 tablet, Rfl: 1 .  budesonide-formoterol (SYMBICORT) 160-4.5 MCG/ACT inhaler, Inhale 2 puffs into  the lungs 2 (two) times daily., Disp: 3 Inhaler, Rfl: 1 .  Cholecalciferol (VITAMIN D) 2000 UNITS tablet, Take 1 tablet by mouth daily., Disp: , Rfl:  .  dapagliflozin propanediol (FARXIGA) 10 MG TABS tablet, Take 10 mg by mouth daily., Disp: 90 tablet, Rfl: 1 .  fluticasone (FLONASE) 50 MCG/ACT nasal spray, Place 2 sprays into both nostrils daily., Disp: 16 g, Rfl: 5 .  gabapentin (NEURONTIN) 100 MG capsule, Take 1 capsule by mouth 2 (two) times daily., Disp: , Rfl:  .  gabapentin (NEURONTIN) 300 MG capsule, Take 1 capsule (300 mg total) by mouth 3 (three) times daily. (Patient taking differently: Take 300 mg by mouth daily. ), Disp: 90 capsule, Rfl: 2 .  IBUPROFEN PO, Take by mouth as needed., Disp: , Rfl:  .  ipratropium-albuterol (DUONEB) 0.5-2.5 (3) MG/3ML SOLN, Take 3 mLs by nebulization every 4 (four) hours as needed., Disp: 120 mL, Rfl: 0 .  loratadine (CLARITIN) 10 MG tablet, Take 10 mg by mouth daily., Disp: , Rfl:  .  meloxicam (MOBIC) 15 MG tablet, Take 1 tablet (15 mg total) by mouth daily for 7 days., Disp: 30 tablet, Rfl: 1 .  methocarbamol (ROBAXIN) 500 MG tablet, Take 1 tablet (500 mg total) by mouth 3 (three) times daily as needed for up to 5 days., Disp: 15 tablet, Rfl: 0 .  montelukast (SINGULAIR) 10 MG tablet, Take 1 tablet (10 mg total) by mouth at bedtime., Disp: 90 tablet, Rfl: 1 .  norethindrone (AYGESTIN) 5 MG tablet, Take 1  tablet (5 mg total) by mouth daily., Disp: 90 tablet, Rfl: 1 .  olmesartan-hydrochlorothiazide (BENICAR HCT) 20-12.5 MG tablet, Take 1 tablet by mouth daily., Disp: 90 tablet, Rfl: 1 .  omeprazole (PRILOSEC) 10 MG capsule, Take 1 capsule (10 mg total) by mouth daily., Disp: 30 capsule, Rfl: 0 .  PROAIR HFA 108 (90 Base) MCG/ACT inhaler, INHALE 2 PUFFS BY MOUTH EVERY 6 HOURS AS NEEDED FOR WHEEZING OR  SHORTNESS  OF  BREATH, Disp: 18 g, Rfl: 0  Allergies  Allergen Reactions  . Fish Allergy Shortness Of Breath  . Penicillins Anaphylaxis and Rash    Childhood allergy  . Adhesive [Tape] Rash    Paper tape ok to use.    I personally reviewed active problem list, medication list, allergies, family history, social history with the patient/caregiver today.   ROS  Constitutional: Negative for fever or weight change.  Respiratory: positive for intermittent cough and shortness of breath.   Cardiovascular: Negative for chest pain or palpitations.  Gastrointestinal: Positive  for abdominal pain - seeing Dr. Glennon Mac, had some loose stools last week but resolved now  Musculoskeletal: Positive  for intermittent gait problem but no joint swelling.  Skin: Negative for rash.  Neurological: Negative for dizziness or headache.  No other specific complaints in a complete review of systems (except as listed in HPI above).  Objective  Vitals:   01/20/18 0911  BP: 138/84  Pulse: 63  Resp: 16  Temp: 98.5 F (36.9 C)  TempSrc: Oral  SpO2: 96%  Weight: 271 lb 8 oz (123.2 kg)  Height: 5\' 2"  (1.575 m)    Body mass index is 49.66 kg/m.  Physical Exam  Constitutional: Patient appears well-developed and well-nourished. Obese No distress.  HEENT: head atraumatic, normocephalic, pupils equal and reactive to light,  neck supple, throat within normal limits Cardiovascular: Normal rate, regular rhythm and normal heart sounds.  No murmur heard. No BLE edema. Pulmonary/Chest: Effort normal and breath  sounds normal. No respiratory distress. Abdominal: Soft.  There is no tenderness. Psychiatric: Patient has a normal mood and affect. behavior is normal. Judgment and thought content normal. Muscular Skeletal: decrease rom of right shoulder, pain during palpation of lumbar spine, negative straight leg raise   Recent Results (from the past 2160 hour(s))  HM DIABETES EYE EXAM     Status: None   Collection Time: 10/23/17 12:00 AM  Result Value Ref Range   HM Diabetic Eye Exam No Retinopathy No Retinopathy    Comment: [facility], Dr. Benay Pillow  Lipid panel     Status: Abnormal   Collection Time: 12/24/17  9:39 AM  Result Value Ref Range   Cholesterol 160 <200 mg/dL   HDL 39 (L) >50 mg/dL   Triglycerides 181 (H) <150 mg/dL   LDL Cholesterol (Calc) 93 mg/dL (calc)    Comment: Reference range: <100 . Desirable range <100 mg/dL for primary prevention;   <70 mg/dL for patients with CHD or diabetic patients  with > or = 2 CHD risk factors. Marland Kitchen LDL-C is now calculated using the Martin-Hopkins  calculation, which is a validated novel method providing  better accuracy than the Friedewald equation in the  estimation of LDL-C.  Cresenciano Genre et al. Annamaria Helling. 5035;465(68): 2061-2068  (http://education.[facility].com/faq/FAQ164)    Total CHOL/HDL Ratio 4.1 <5.0 (calc)   Non-HDL Cholesterol (Calc) 121 <130 mg/dL (calc)    Comment: For patients with diabetes plus 1 major ASCVD risk  factor, treating to a non-HDL-C goal of <100 mg/dL  (LDL-C of <70 mg/dL) is considered a therapeutic  option.   COMPLETE METABOLIC PANEL WITH GFR     Status: Abnormal   Collection Time: 12/24/17  9:39 AM  Result Value Ref Range   Glucose, Bld 111 (H) 65 - 99 mg/dL    Comment: .            Fasting reference interval . For someone without known diabetes, a glucose value between 100 and 125 mg/dL is consistent with prediabetes and should be confirmed with a follow-up test. .    BUN 11 7 - 25 mg/dL    Creat 0.70 0.50 - 1.10 mg/dL   GFR, Est Non African American 105 > OR = 60 mL/min/1.42m2   GFR, Est African American 122 > OR = 60 mL/min/1.52m2   BUN/Creatinine Ratio NOT APPLICABLE 6 - 22 (calc)   Sodium 139 135 - 146 mmol/L   Potassium 4.2 3.5 - 5.3 mmol/L   Chloride 101 98 - 110 mmol/L   CO2 27 20 - 32 mmol/L   Calcium 9.6 8.6 - 10.2 mg/dL   Total Protein 7.1 6.1 - 8.1 g/dL   Albumin 4.2 3.6 - 5.1 g/dL   Globulin 2.9 1.9 - 3.7 g/dL (calc)   AG Ratio 1.4 1.0 - 2.5 (calc)   Total Bilirubin 0.5 0.2 - 1.2 mg/dL   Alkaline phosphatase (APISO) 360 (H) 33 - 115 U/L   AST 14 10 - 30 U/L   ALT 18 6 - 29 U/L  Lipase     Status: None   Collection Time: 12/31/17  9:23 AM  Result Value Ref Range   Lipase 29 7 - 60 U/L  Hepatic function panel     Status: Abnormal   Collection Time: 12/31/17  9:23 AM  Result Value Ref Range   Total Protein 6.8 6.1 - 8.1 g/dL   Albumin 4.1 3.6 - 5.1 g/dL   Globulin 2.7 1.9 - 3.7 g/dL (calc)  AG Ratio 1.5 1.0 - 2.5 (calc)   Total Bilirubin 0.4 0.2 - 1.2 mg/dL   Bilirubin, Direct 0.1 0.0 - 0.2 mg/dL   Indirect Bilirubin 0.3 0.2 - 1.2 mg/dL (calc)   Alkaline phosphatase (APISO) 210 (H) 33 - 115 U/L   AST 17 10 - 30 U/L   ALT 18 6 - 29 U/L  POCT glycosylated hemoglobin (Hb A1C)     Status: Abnormal   Collection Time: 01/20/18  9:32 AM  Result Value Ref Range   Hemoglobin A1C 6.7 (A) 4.0 - 5.6 %   HbA1c POC (<> result, manual entry)     HbA1c, POC (prediabetic range)     HbA1c, POC (controlled diabetic range)      Diabetic Foot Exam: Diabetic Foot Exam - Simple   Simple Foot Form Diabetic Foot exam was performed with the following findings:  Yes 01/20/2018 10:02 AM  Visual Inspection No deformities, no ulcerations, no other skin breakdown bilaterally:  Yes Sensation Testing See comments:  Yes Pulse Check Posterior Tibialis and Dorsalis pulse intact bilaterally:  Yes Comments Decrease in sensation both feet      PHQ2/9: Depression screen  [facility] 2/9 01/20/2018 12/24/2017 10/17/2017 07/17/2017 10/09/2016  Decreased Interest 0 0 2 0 0  Down, Depressed, Hopeless 0 0 2 0 0  PHQ - 2 Score 0 0 4 0 0  Altered sleeping 3 1 1  - -  Tired, decreased energy 1 0 1 - -  Change in appetite 0 3 0 - -  Feeling bad or failure about yourself  0 0 1 - -  Trouble concentrating 0 0 2 - -  Moving slowly or fidgety/restless 0 0 0 - -  Suicidal thoughts 0 0 0 - -  PHQ-9 Score 4 4 9  - -  Difficult doing work/chores Not difficult at all Not difficult at all Not difficult at all - -     Fall Risk: Fall Risk  01/20/2018 12/24/2017 10/17/2017 08/23/2017 07/17/2017  Falls in the past year? 0 No No No No      Functional Status Survey: Is the patient deaf or have difficulty hearing?: No Does the patient have difficulty seeing, even when wearing glasses/contacts?: No Does the patient have difficulty concentrating, remembering, or making decisions?: No Does the patient have difficulty walking or climbing stairs?: No Does the patient have difficulty dressing or bathing?: No Does the patient have difficulty doing errands alone such as visiting a doctor's office or shopping?: No   Assessment & Plan  1. Controlled type 2 diabetes mellitus with microalbuminuria, without long-term current use of insulin (HCC)  - POCT glycosylated hemoglobin (Hb A1C) - dapagliflozin propanediol (FARXIGA) 10 MG TABS tablet; Take 10 mg by mouth daily.  Dispense: 90 tablet; Refill: 1  2. Asthma, moderate persistent, well-controlled  - PR EVAL OF BRONCHOSPASM - PROAIR HFA 108 (90 Base) MCG/ACT inhaler; INHALE 2 PUFFS BY MOUTH EVERY 6 HOURS AS NEEDED FOR WHEEZING OR  SHORTNESS  OF  BREATH  Dispense: 18 g; Refill: 0  Over 10 % improvement of FVC and FEV1, needs to continue symbicort daily , continue singulair   3. Dyslipidemia associated with type 2 diabetes mellitus (HCC)  - atorvastatin (LIPITOR) 40 MG tablet; Take 1 tablet (40 mg total) by mouth daily.  Dispense: 90 tablet;  Refill: 1  4. Benign essential HTN  - olmesartan-hydrochlorothiazide (BENICAR HCT) 20-12.5 MG tablet; Take 1 tablet by mouth daily.  Dispense: 90 tablet; Refill: 1  5. Morbid obesity due to  excess calories ([place])  Discussed with the patient the risk posed by an increased BMI. Discussed importance of portion control, calorie counting and at least 150 minutes of physical activity weekly. Avoid sweet beverages and drink more water. Eat at least 6 servings of fruit and vegetables daily   6. Rotator cuff tendinitis, right  - Ambulatory referral to Orthopedic Surgery  7. DDD (degenerative disc disease), lumbosacral  - Ambulatory referral to Orthopedic Surgery  8. Elevated alkaline phosphatase level  - Alkaline phosphatase  9. Type 2 diabetes, controlled, with peripheral neuropathy ([place])  Seen by neurologist

## 2018-01-21 LAB — ALKALINE PHOSPHATASE: Alkaline phosphatase (APISO): 89 U/L (ref 33–115)

## 2018-02-05 ENCOUNTER — Ambulatory Visit (INDEPENDENT_AMBULATORY_CARE_PROVIDER_SITE_OTHER): Payer: BLUE CROSS/BLUE SHIELD

## 2018-02-05 ENCOUNTER — Ambulatory Visit (INDEPENDENT_AMBULATORY_CARE_PROVIDER_SITE_OTHER): Payer: BLUE CROSS/BLUE SHIELD | Admitting: Obstetrics and Gynecology

## 2018-02-05 ENCOUNTER — Encounter: Payer: Self-pay | Admitting: Obstetrics and Gynecology

## 2018-02-05 VITALS — BP 128/88 | Ht 62.0 in | Wt 270.0 lb

## 2018-02-05 DIAGNOSIS — D252 Subserosal leiomyoma of uterus: Secondary | ICD-10-CM | POA: Diagnosis not present

## 2018-02-05 DIAGNOSIS — N946 Dysmenorrhea, unspecified: Secondary | ICD-10-CM

## 2018-02-05 DIAGNOSIS — N809 Endometriosis, unspecified: Secondary | ICD-10-CM | POA: Diagnosis not present

## 2018-02-05 DIAGNOSIS — N921 Excessive and frequent menstruation with irregular cycle: Secondary | ICD-10-CM

## 2018-02-05 DIAGNOSIS — D251 Intramural leiomyoma of uterus: Secondary | ICD-10-CM | POA: Diagnosis not present

## 2018-02-05 NOTE — Progress Notes (Signed)
Gynecology Ultrasound Follow Up   Chief Complaint  Patient presents with  . Follow-up  U/S follow up for pelvic pain, dysmenorrhea, menorrhagia with irregular cycle, and fibroid growth evaluation on progesterone-only medication   History of Present Illness: Patient is a 47 y.o. female who presents today for ultrasound evaluation of the above .  Ultrasound demonstrates the following findings Adnexa: no masses seen left ovary surgically absent Uterus: retroflexed with endometrial stripe  2.0 mm Additional: left fundal fibroid (intramural) measuring 3.4 x 3.2 x 3.5 cm.  Left fibroid measuring 1.0 x 0.9 x 0.8 cm (subserosal).  Essentially an unchanged ultrasound from prior.   No menses since last visit.  Minimal pain.  Has not taken the medication I prescribed since last visit.   Past Medical History:  Diagnosis Date  . Abnormal CBC   . Allergy   . Arthritis    left knee  . Asthma   . Cervical polyp   . Chronic foot pain   . Complication of anesthesia    takes longer to wake up.  . Contact dermatitis   . Diabetes mellitus without complication ([place])   . Dysmenorrhea   . Edema   . 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 Fibroid uterus    left side, managed by Dr. Glennon Mac @ [facility]  . GERD (gastroesophageal reflux disease)   . Headache    history of migraines  . Hyperlipidemia   . Hypertension   . Lumbago   . Neuropathy   . Obesity   . Occasional tremors     Past Surgical History:  Procedure Laterality Date  . APPENDECTOMY    . cervical polyp removal    . DILATION AND CURETTAGE OF UTERUS    . HYSTEROSCOPY    . HYSTEROSCOPY W/D&C N/A 10/12/2015   Procedure: DILATATION AND CURETTAGE /HYSTEROSCOPY;  Surgeon: Will Bonnet, MD;  Location: [facility] ORS;  Service: Gynecology;  Laterality: N/A;  . LAPAROSCOPIC BILATERAL SALPINGO OOPHERECTOMY N/A 10/12/2015   Procedure: LAPAROSCOPIC RIGHT SALPINGECTOMY, LEFT OOPHORECTOMY;  Surgeon: Will Bonnet, MD;  Location: [facility] ORS;   Service: Gynecology;  Laterality: N/A;  . LAPAROSCOPIC OVARIAN CYSTECTOMY Bilateral 10/12/2015   Procedure: LAPAROSCOPIC OVARIAN CYSTECTOMY;  Surgeon: Will Bonnet, MD;  Location: [facility] ORS;  Service: Gynecology;  Laterality: Bilateral;  . OOPHORECTOMY    . OVARIAN CYST REMOVAL     Family History  Problem Relation Age of Onset  . Hypertension Mother   . Diabetes Mother   . CAD Mother   . Cancer Mother        cervical  . Heart attack Mother 51       Triple Bypass  . CAD Father   . Hypertension Father   . Arthritis Father   . Thyroid disease Sister   . Alzheimer's disease Maternal Grandmother   . Alzheimer's disease Maternal Grandfather   . Breast cancer Paternal Grandmother     Social History   Socioeconomic History  . Marital status: Married    Spouse name: Kasandra Knudsen  . Number of children: 0  . Years of education: Not on file  . Highest education level: Some college, no degree  Occupational History  . Occupation: Museum/gallery curator  Social Needs  . Financial resource strain: Not hard at all  . Food insecurity:    Worry: Never true    Inability: Never true  . Transportation needs:    Medical: No    Non-medical: No  Tobacco Use  . Smoking status: Former  Smoker    Years: 1.00    Types: Cigarettes    Start date: 12/03/2001    Last attempt to quit: 12/28/2002    Years since quitting: 15.1  . Smokeless tobacco: Never Used  . Tobacco comment: smoked 3 to 4 cigarrettes a day for a year  Substance and Sexual Activity  . Alcohol use: Yes    Alcohol/week: 0.0 standard drinks    Comment: occasionally drinks margarita, once or twice a year  . Drug use: No  . Sexual activity: Yes    Partners: Male    Comment: Partial Hysterectomy  Lifestyle  . Physical activity:    Days per week: 1 day    Minutes per session: 30 min  . Stress: Not at all  Relationships  . Social connections:    Talks on phone: Three times a week    Gets together: Never    Attends religious service: More  than 4 times per year    Active member of club or organization: No    Attends meetings of clubs or organizations: Never    Relationship status: Married  . Intimate partner violence:    Fear of current or ex partner: No    Emotionally abused: No    Physically abused: No    Forced sexual activity: No  Other Topics Concern  . Not on file  Social History Narrative  . Not on file    Allergies  Allergen Reactions  . Fish Allergy Shortness Of Breath  . Penicillins Anaphylaxis and Rash    Childhood allergy  . Adhesive [Tape] Rash    Paper tape ok to use.    Prior to Admission medications   Medication Sig Start Date End Date Taking? Authorizing Provider  Alpha Lipoic Acid 200 MG CAPS Take by mouth.    [provider]  aspirin EC 81 MG tablet Take 1 tablet (81 mg total) by mouth daily. 10/09/16   Steele Sizer, MD  atorvastatin (LIPITOR) 40 MG tablet Take 1 tablet (40 mg total) by mouth daily. 01/20/18   Steele Sizer, MD  budesonide-formoterol (SYMBICORT) 160-4.5 MCG/ACT inhaler Inhale 2 puffs into the lungs 2 (two) times daily. 01/20/18   Steele Sizer, MD  Cholecalciferol (VITAMIN D) 2000 UNITS tablet Take 1 tablet by mouth daily. 08/23/10   [provider]  dapagliflozin propanediol (FARXIGA) 10 MG TABS tablet Take 10 mg by mouth daily. 01/20/18   Steele Sizer, MD  fluticasone (FLONASE) 50 MCG/ACT nasal spray Place 2 sprays into both nostrils daily. 10/17/17   Steele Sizer, MD  gabapentin (NEURONTIN) 100 MG capsule Take 1 capsule by mouth 2 (two) times daily. 10/15/17   Lang Snow, NP  gabapentin (NEURONTIN) 300 MG capsule Take 1 capsule (300 mg total) by mouth 3 (three) times daily. Patient taking differently: Take 300 mg by mouth daily.  06/24/17 06/24/18  Cuthriell, Charline Bills, PA-C  IBUPROFEN PO Take by mouth as needed.    [provider]  ipratropium-albuterol (DUONEB) 0.5-2.5 (3) MG/3ML SOLN Take 3 mLs by nebulization every 4 (four)  hours as needed. 01/20/18   Steele Sizer, MD  loratadine (CLARITIN) 10 MG tablet Take 10 mg by mouth daily.    [provider]  montelukast (SINGULAIR) 10 MG tablet Take 1 tablet (10 mg total) by mouth at bedtime. 10/17/17   Steele Sizer, MD  olmesartan-hydrochlorothiazide (BENICAR HCT) 20-12.5 MG tablet Take 1 tablet by mouth daily. 01/20/18   Steele Sizer, MD  omeprazole (PRILOSEC) 10 MG capsule Take  1 capsule (10 mg total) by mouth daily. 06/24/17   Cuthriell, Charline Bills, PA-C  PROAIR HFA 108 (90 Base) MCG/ACT inhaler INHALE 2 PUFFS BY MOUTH EVERY 6 HOURS AS NEEDED FOR WHEEZING OR  SHORTNESS  OF  BREATH 01/20/18   Steele Sizer, MD    Physical Exam BP 128/88   Ht 5\' 2"  (1.575 m)   Wt 270 lb (122.5 kg)   LMP 11/09/2017 (Approximate)   BMI 49.38 kg/m    General: NAD HEENT: normocephalic, anicteric Pulmonary: No increased work of breathing Extremities: no edema, erythema, or tenderness Neurologic: Grossly intact, normal gait Psychiatric: mood appropriate, affect full  Imaging Results: US Pelvis Transvanginal Non-ob (tv Only)  Result Date: 02/05/2018 Patient Name: Ahuva Poynor DOB: 21-Jan-1971 MRN: [id number] ULTRASOUND REPORT Location: [facility] Date of Service: 02/05/2018 Indications: Follow up fibroid Findings: The uterus is retroverted and measures 6.7 x 4.0 x 2.9cm. Echo texture is homogenous with evidence of focal masses. Within the uterus are multiple suspected fibroids measuring: Fibroid 1:  3.4 x 3.2 x 3.5cm  (Left, intramural) Fibroid 2:  1.0 x 0.9 x 0.8cm  (Left, subserosal) The Endometrium measures 2.0 mm. Right Ovary measures 1.6 x 1.5 x 1.6cm. It is normal in appearance. Left oophorectomy. Survey of the adnexa demonstrates no adnexal masses. There is no free fluid in the cul de sac. Impression: 1. Two fibroids that appear unchanged from previous study. 2. Otherwise, normal gynecologic ultrasound Vita Barley, RDMS RVT The ultrasound images and findings  were reviewed by me and I agree with the above report. Prentice Docker, MD, [facility], [facility] 02/05/2018 9:31 AM       Assessment: 47 y.o. G0P0000  1. Menorrhagia with irregular cycle   2. Endometriosis   3. Dysmenorrhea   4. Intramural and subserous leiomyoma of uterus      Plan: Problem List Items Addressed This Visit      Genitourinary   Dysmenorrhea   Fibroid uterus     Other   Menorrhagia with irregular cycle - Primary   Endometriosis     She has not had a menses in several months and has a very thin endometrial lining (34mm).  Her symptoms have improved. Therefore, I recommend she not take the norethindrone at this time.  We discussed her transitioning into menopause and that she may have more menses. However, she seems to be transitioning. She may follow up PRN.  15 minutes spent in face to face discussion with > 50% spent in counseling,management, and coordination of care of her Menorrhagia with irregular cycle, dysmenorrhea, fibroid uterus, endometriosis.   Prentice Docker, MD, [facility], [facility] 02/05/2018 12:56 PM

## 2018-04-22 ENCOUNTER — Ambulatory Visit: Payer: BLUE CROSS/BLUE SHIELD | Admitting: Family Medicine

## 2018-04-25 ENCOUNTER — Encounter: Payer: Self-pay | Admitting: Family Medicine

## 2018-04-25 ENCOUNTER — Ambulatory Visit: Payer: 59 | Admitting: Family Medicine

## 2018-04-25 VITALS — BP 120/72 | HR 90 | Temp 97.9°F | Resp 16 | Ht 62.0 in | Wt 269.2 lb

## 2018-04-25 DIAGNOSIS — M7581 Other shoulder lesions, right shoulder: Secondary | ICD-10-CM

## 2018-04-25 DIAGNOSIS — E1142 Type 2 diabetes mellitus with diabetic polyneuropathy: Secondary | ICD-10-CM

## 2018-04-25 DIAGNOSIS — R809 Proteinuria, unspecified: Secondary | ICD-10-CM

## 2018-04-25 DIAGNOSIS — E1169 Type 2 diabetes mellitus with other specified complication: Secondary | ICD-10-CM | POA: Diagnosis not present

## 2018-04-25 DIAGNOSIS — E1129 Type 2 diabetes mellitus with other diabetic kidney complication: Secondary | ICD-10-CM | POA: Diagnosis not present

## 2018-04-25 DIAGNOSIS — R002 Palpitations: Secondary | ICD-10-CM

## 2018-04-25 DIAGNOSIS — I10 Essential (primary) hypertension: Secondary | ICD-10-CM

## 2018-04-25 DIAGNOSIS — E785 Hyperlipidemia, unspecified: Secondary | ICD-10-CM

## 2018-04-25 DIAGNOSIS — E538 Deficiency of other specified B group vitamins: Secondary | ICD-10-CM

## 2018-04-25 DIAGNOSIS — J4540 Moderate persistent asthma, uncomplicated: Secondary | ICD-10-CM

## 2018-04-25 LAB — POCT GLYCOSYLATED HEMOGLOBIN (HGB A1C): HbA1c, POC (controlled diabetic range): 6.6 % (ref 0.0–7.0)

## 2018-04-25 MED ORDER — MELOXICAM 15 MG PO TABS: 15.0000 mg | ORAL_TABLET | Freq: Every day | ORAL | 0 refills | Status: DC

## 2018-04-25 NOTE — Progress Notes (Signed)
Name: Kelsey Alvarez   MRN: [id number]    DOB: December 03, 1970   Date:04/25/2018       Progress Note  Subjective  Chief Complaint  Chief Complaint  Patient presents with  . Medication Refill  . Diabetes    Checks periodically Average-135  . Asthma    Worst with changing in weather  . Back Pain  . Shoulder Pain    Right shoulder pain   . Hyperlipidemia  . Hypertension    States she has been having heart palpations and would like it to be checked out    HPI   QI:ONGE9B is 6.6%, she was switched from [place] to [place] but that cause dyspesia also and is on Farxiga now and A1C is at goal and patient is tolerating medication well. She is on ARB for microalbuminuria and gabapentin for neuropathy.   Right shoulder pain: she went to [facility] on 01/19/2018 with severe right shoulder pain, no history of trauma.Initially was lateral shoulder and radiating to right trapezium and dull. We placed referral Ortho but symptoms had improved by the time she got the phone call and she decided not to go. However over the past month she noticed that the pain is back, more shooting now, on anterior shoulder , does not radiate, intermittently, sometimes wakes her up at night. She meloxicam seems to help with symptoms.   Palpitation: going on for the past month, she states lasts a few seconds and not associated with nausea or diaphoresis, it resolves by itself. Not sure what makes symptoms improves or aggravates. She denies increasing caffeine intake. Not sure if it happens after albuterol use.    Low back pain with radiculitis: went to [facility] back in 08/2017, after that referred to Neurologist and had EMG that showed neuropathy, being treated with gabapentin, current pain level is 5/10. Stable She has DDD it may also be secondary to endometriosis or fibroid tumors   Morbid Obesity: weight is stable, off Ozempic, not consistently very active, she is still drinking sodas, and explained the importance of dietary changes    Asthma:she has moderate asthma,she is using Symbicort but still once a day, she is not coughing lately, no SOB at this time. She states with change in weather it causes a little flare   Hyperlipidemia: she states she has been taking it daily now, and is tolerating it well. Unchanged   Elevation of alkaline phosphatase: back to normal range , we will continue to monitor   HTN: continue medication, dizzinessshe has noticed  Palpitations lately, a few episodes in the past month.No side effects of medication. Compliant with medication   Fibroid tumors: she has heavy bleeding during cycles, could not tolerate ocp, she is under the care of Dr. Glennon Mac   Patient Active Problem List   Diagnosis Date Noted  . Type 2 diabetes, controlled, with peripheral neuropathy ([place]) 01/20/2018  . Endometriosis 11/06/2017  . Menorrhagia with irregular cycle 10/28/2017  . Fibroid uterus 10/28/2017  . Carpal tunnel syndrome on both sides 10/17/2017  . B12 deficiency 04/27/2016  . History of endometriosis 11/17/2015  . Oligomenorrhea 10/12/2015  . Benign essential HTN 12/28/2014  . Cervical polyp 12/28/2014  . Controlled type 2 diabetes mellitus with microalbuminuria ([place]) 12/28/2014  . Dyslipidemia 12/28/2014  . Dysmenorrhea 12/28/2014  . Edema 12/28/2014  . Female infertility 12/28/2014  . Gastro-esophageal reflux disease without esophagitis 12/28/2014  . Morbid obesity ([place]) 12/28/2014  . Asthma, moderate persistent, poorly-controlled 12/28/2014  . Perennial allergic rhinitis 12/28/2014  .  Intermittent tremor 12/28/2014  . Vitamin D deficiency 12/28/2014  . LBP (low back pain) 12/28/2014  . Left sciatic nerve pain 12/28/2014    Past Surgical History:  Procedure Laterality Date  . APPENDECTOMY    . cervical polyp removal    . DILATION AND CURETTAGE OF UTERUS    . HYSTEROSCOPY    . HYSTEROSCOPY W/D&C N/A 10/12/2015   Procedure: DILATATION AND CURETTAGE /HYSTEROSCOPY;  Surgeon: Will Bonnet, MD;  Location: [facility] ORS;  Service: Gynecology;  Laterality: N/A;  . LAPAROSCOPIC BILATERAL SALPINGO OOPHERECTOMY N/A 10/12/2015   Procedure: LAPAROSCOPIC RIGHT SALPINGECTOMY, LEFT OOPHORECTOMY;  Surgeon: Will Bonnet, MD;  Location: [facility] ORS;  Service: Gynecology;  Laterality: N/A;  . LAPAROSCOPIC OVARIAN CYSTECTOMY Bilateral 10/12/2015   Procedure: LAPAROSCOPIC OVARIAN CYSTECTOMY;  Surgeon: Will Bonnet, MD;  Location: [facility] ORS;  Service: Gynecology;  Laterality: Bilateral;  . OOPHORECTOMY    . OVARIAN CYST REMOVAL      Family History  Problem Relation Age of Onset  . Hypertension Mother   . Diabetes Mother   . CAD Mother   . Cancer Mother        cervical  . Heart attack Mother 41       Triple Bypass  . CAD Father   . Hypertension Father   . Arthritis Father   . Thyroid disease Sister   . Alzheimer's disease Maternal Grandmother   . Alzheimer's disease Maternal Grandfather   . Breast cancer Paternal Grandmother     Social History   Socioeconomic History  . Marital status: Married    Spouse name: Kasandra Knudsen  . Number of children: 0  . Years of education: Not on file  . Highest education level: Some college, no degree  Occupational History  . Occupation: Museum/gallery curator  Social Needs  . Financial resource strain: Not hard at all  . Food insecurity:    Worry: Never true    Inability: Never true  . Transportation needs:    Medical: No    Non-medical: No  Tobacco Use  . Smoking status: Former Smoker    Years: 1.00    Types: Cigarettes    Start date: 12/03/2001    Last attempt to quit: 12/28/2002    Years since quitting: 15.3  . Smokeless tobacco: Never Used  . Tobacco comment: smoked 3 to 4 cigarrettes a day for a year  Substance and Sexual Activity  . Alcohol use: Yes    Alcohol/week: 0.0 standard drinks    Comment: occasionally drinks margarita, once or twice a year  . Drug use: No  . Sexual activity: Yes    Partners: Male    Comment: Partial  Hysterectomy  Lifestyle  . Physical activity:    Days per week: 1 day    Minutes per session: 30 min  . Stress: Not at all  Relationships  . Social connections:    Talks on phone: Three times a week    Gets together: Never    Attends religious service: More than 4 times per year    Active member of club or organization: No    Attends meetings of clubs or organizations: Never    Relationship status: Married  . Intimate partner violence:    Fear of current or ex partner: No    Emotionally abused: No    Physically abused: No    Forced sexual activity: No  Other Topics Concern  . Not on file  Social History Narrative  . Not on file  Current Outpatient Medications:  .  Alpha Lipoic Acid 200 MG CAPS, Take by mouth., Disp: , Rfl:  .  aspirin EC 81 MG tablet, Take 1 tablet (81 mg total) by mouth daily., Disp: 30 tablet, Rfl: 0 .  atorvastatin (LIPITOR) 40 MG tablet, Take 1 tablet (40 mg total) by mouth daily., Disp: 90 tablet, Rfl: 1 .  budesonide-formoterol (SYMBICORT) 160-4.5 MCG/ACT inhaler, Inhale 2 puffs into the lungs 2 (two) times daily., Disp: 3 Inhaler, Rfl: 1 .  Cholecalciferol (VITAMIN D) 2000 UNITS tablet, Take 1 tablet by mouth daily., Disp: , Rfl:  .  dapagliflozin propanediol (FARXIGA) 10 MG TABS tablet, Take 10 mg by mouth daily., Disp: 90 tablet, Rfl: 1 .  fluticasone (FLONASE) 50 MCG/ACT nasal spray, Place 2 sprays into both nostrils daily., Disp: 16 g, Rfl: 5 .  gabapentin (NEURONTIN) 100 MG capsule, Take 1 capsule by mouth 2 (two) times daily., Disp: , Rfl:  .  gabapentin (NEURONTIN) 300 MG capsule, Take 1 capsule (300 mg total) by mouth 3 (three) times daily. (Patient taking differently: Take 300 mg by mouth at bedtime. ), Disp: 90 capsule, Rfl: 2 .  IBUPROFEN PO, Take by mouth as needed., Disp: , Rfl:  .  ipratropium-albuterol (DUONEB) 0.5-2.5 (3) MG/3ML SOLN, Take 3 mLs by nebulization every 4 (four) hours as needed., Disp: 120 mL, Rfl: 0 .  loratadine  (CLARITIN) 10 MG tablet, Take 10 mg by mouth daily., Disp: , Rfl:  .  meloxicam (MOBIC) 15 MG tablet, TK 1 T PO QD, Disp: , Rfl:  .  montelukast (SINGULAIR) 10 MG tablet, Take 1 tablet (10 mg total) by mouth at bedtime., Disp: 90 tablet, Rfl: 1 .  olmesartan-hydrochlorothiazide (BENICAR HCT) 20-12.5 MG tablet, Take 1 tablet by mouth daily., Disp: 90 tablet, Rfl: 1 .  omeprazole (PRILOSEC) 10 MG capsule, Take 1 capsule (10 mg total) by mouth daily., Disp: 30 capsule, Rfl: 0 .  PROAIR HFA 108 (90 Base) MCG/ACT inhaler, INHALE 2 PUFFS BY MOUTH EVERY 6 HOURS AS NEEDED FOR WHEEZING OR  SHORTNESS  OF  BREATH, Disp: 18 g, Rfl: 0  Allergies  Allergen Reactions  . Fish Allergy Shortness Of Breath  . Penicillins Anaphylaxis and Rash    Childhood allergy  . Levofloxacin Other (See Comments)    Cramping  . Adhesive [Tape] Rash    Paper tape ok to use.    I personally reviewed active problem list, medication list, allergies, family history, social history with the patient/caregiver today.   ROS  Constitutional: Negative for fever or weight change.  Respiratory: Negative for cough and shortness of breath.   Cardiovascular: Negative for chest pain , positive for  palpitations.  Gastrointestinal: Negative for abdominal pain, no bowel changes.  Musculoskeletal: Negative for gait problem or joint swelling.  Skin: Negative for rash.  Neurological: Negative for dizziness or headache.  No other specific complaints in a complete review of systems (except as listed in HPI above).  Objective  Vitals:   04/25/18 1331  BP: 120/72  Pulse: 90  Resp: 16  Temp: 97.9 F (36.6 C)  TempSrc: Oral  SpO2: 98%  Weight: 269 lb 3.2 oz (122.1 kg)  Height: 5\' 2"  (1.575 m)    Body mass index is 49.24 kg/m.  Physical Exam  Constitutional: Patient appears well-developed and well-nourished. Obese  No distress.  HEENT: head atraumatic, normocephalic, pupils equal and reactive to light, neck supple, throat  within normal limits Cardiovascular: Normal rate, regular rhythm and normal heart sounds.  No murmur heard. No BLE edema. Pulmonary/Chest: Effort normal and breath sounds normal. No respiratory distress. Abdominal: Soft.  There is no tenderness. Muscular Skeletal: pain during palpation of anterior and lateral shoulder, pain with internal rotation and also abduction of right shoulder  Psychiatric: Patient has a normal mood and affect. behavior is normal. Judgment and thought content normal.   Recent Results (from the past 2160 hour(s))  POCT HgB A1C     Status: Normal   Collection Time: 04/25/18  1:48 PM  Result Value Ref Range   Hemoglobin A1C     HbA1c POC (<> result, manual entry)     HbA1c, POC (prediabetic range)     HbA1c, POC (controlled diabetic range) 6.6 0.0 - 7.0 %      PHQ2/9: Depression screen [facility] 2/9 04/25/2018 01/20/2018 12/24/2017 10/17/2017 07/17/2017  Decreased Interest 0 0 0 2 0  Down, Depressed, Hopeless 0 0 0 2 0  PHQ - 2 Score 0 0 0 4 0  Altered sleeping - 3 1 1  -  Tired, decreased energy - 1 0 1 -  Change in appetite - 0 3 0 -  Feeling bad or failure about yourself  - 0 0 1 -  Trouble concentrating - 0 0 2 -  Moving slowly or fidgety/restless - 0 0 0 -  Suicidal thoughts - 0 0 0 -  PHQ-9 Score - 4 4 9  -  Difficult doing work/chores - Not difficult at all Not difficult at all Not difficult at all -     Fall Risk: Fall Risk  04/25/2018 01/20/2018 12/24/2017 10/17/2017 08/23/2017  Falls in the past year? 0 0 No No No     Functional Status Survey: Is the patient deaf or have difficulty hearing?: No Does the patient have difficulty seeing, even when wearing glasses/contacts?: Yes(glasses) Does the patient have difficulty concentrating, remembering, or making decisions?: No Does the patient have difficulty walking or climbing stairs?: No Does the patient have difficulty dressing or bathing?: No Does the patient have difficulty doing errands alone such as  visiting a doctor's office or shopping?: No    Assessment & Plan   1. Controlled type 2 diabetes mellitus with microalbuminuria, without long-term current use of insulin (HCC)  - POCT HgB A1C  2. Morbid obesity due to excess calories [facility])  Discussed with the patient the risk posed by an increased BMI. Discussed importance of portion control, calorie counting and at least 150 minutes of physical activity weekly. Avoid sweet beverages and drink more water. Eat at least 6 servings of fruit and vegetables daily   3. Palpitation  Discussed avoid caffeine intake, monitor to see when it happens and if more frequent, like multiple times a week call back so we can refer her to cardiologist   4. Dyslipidemia associated with type 2 diabetes mellitus ([place])  On statin therapy   5. Benign essential HTN  Well controlled  6. Asthma, moderate persistent, well-controlled   7. Type 2 diabetes, controlled, with peripheral neuropathy (HCC)  Continue gabapentin, but explained importance of taking medication daily   8. B12 deficiency   9. Dyslipidemia   10. Rotator cuff tendinitis, right  - meloxicam (MOBIC) 15 MG tablet; Take 1 tablet (15 mg total) by mouth daily.  Dispense: 90 tablet; Refill: 0

## 2018-05-06 ENCOUNTER — Other Ambulatory Visit: Payer: Self-pay | Admitting: Family Medicine

## 2018-05-06 DIAGNOSIS — J4540 Moderate persistent asthma, uncomplicated: Secondary | ICD-10-CM

## 2018-05-09 ENCOUNTER — Telehealth: Payer: Self-pay | Admitting: Family Medicine

## 2018-05-09 NOTE — Telephone Encounter (Signed)
Copied from [place] [id number]. Topic: Quick Communication - Rx Refill/Question >> May 09, 2018 11:05 AM Rayann Heman wrote: Medication:PROAIR HFA 108 (90 Base) MCG/ACT inhaler [[id number]] pt states that she needs a PA for medication. Please advise

## 2018-05-09 NOTE — Telephone Encounter (Signed)
While doing PA for Proair for patient insurance it states it is covered and does not require PA.  Spoke with [facility] and the generic is covered and is $5. Patient was notified it is ready for her to pick up.   [facility] Medication Card Information.   Bin # [id number] Group # [id number] PCN# [id number]  ID# [id number]

## 2018-06-26 ENCOUNTER — Encounter: Payer: Self-pay | Admitting: Family Medicine

## 2018-06-26 ENCOUNTER — Ambulatory Visit (INDEPENDENT_AMBULATORY_CARE_PROVIDER_SITE_OTHER): Payer: 59 | Admitting: Family Medicine

## 2018-06-26 ENCOUNTER — Other Ambulatory Visit: Payer: Self-pay

## 2018-06-26 DIAGNOSIS — R300 Dysuria: Secondary | ICD-10-CM | POA: Diagnosis not present

## 2018-06-26 MED ORDER — PHENAZOPYRIDINE HCL 100 MG PO TABS: 100.0000 mg | ORAL_TABLET | Freq: Three times a day (TID) | ORAL | 0 refills | Status: DC | PRN

## 2018-06-26 MED ORDER — NITROFURANTOIN MONOHYD MACRO 100 MG PO CAPS: 100.0000 mg | ORAL_CAPSULE | Freq: Two times a day (BID) | ORAL | 0 refills | Status: DC

## 2018-06-26 NOTE — Progress Notes (Signed)
Name: Kelsey Alvarez   MRN: [id number]    DOB: May 25, 1970   Date:06/26/2018       Progress Note  Subjective  Chief Complaint  Chief Complaint  Patient presents with  . Urinary Tract Infection    burning, pain, urgency    I connected with  Kelsey Alvarez  on 06/26/18 at  2:40 PM EDT by a video enabled telemedicine application and verified that I am speaking with the correct person using two identifiers.  I discussed the limitations of evaluation and management by telemedicine and the availability of in person appointments. The patient expressed understanding and agreed to proceed. Staff also discussed with the patient that there may be a patient responsible charge related to this service. Patient Location: at home  Provider Location: [facility]   HPI  Dysuria: she states symptoms started 3 days ago with dysuria, frequency, nocturia, hesitancy, supra pubic pain, mild lower back pain. No nausea or vomiting. No fever or chills. She increased water intake and tried cranberry juice and tylenol without help.    Patient Active Problem List   Diagnosis Date Noted  . Type 2 diabetes, controlled, with peripheral neuropathy ([place]) 01/20/2018  . Endometriosis 11/06/2017  . Menorrhagia with irregular cycle 10/28/2017  . Fibroid uterus 10/28/2017  . Carpal tunnel syndrome on both sides 10/17/2017  . B12 deficiency 04/27/2016  . History of endometriosis 11/17/2015  . Oligomenorrhea 10/12/2015  . Benign essential HTN 12/28/2014  . Cervical polyp 12/28/2014  . Controlled type 2 diabetes mellitus with microalbuminuria ([place]) 12/28/2014  . Dyslipidemia 12/28/2014  . Dysmenorrhea 12/28/2014  . Edema 12/28/2014  . Female infertility 12/28/2014  . Gastro-esophageal reflux disease without esophagitis 12/28/2014  . Morbid obesity ([place]) 12/28/2014  . Asthma, moderate persistent, poorly-controlled 12/28/2014  . Perennial allergic rhinitis 12/28/2014  . Intermittent tremor  12/28/2014  . Vitamin D deficiency 12/28/2014  . LBP (low back pain) 12/28/2014  . Left sciatic nerve pain 12/28/2014    Past Surgical History:  Procedure Laterality Date  . APPENDECTOMY    . cervical polyp removal    . DILATION AND CURETTAGE OF UTERUS    . HYSTEROSCOPY    . HYSTEROSCOPY W/D&C N/A 10/12/2015   Procedure: DILATATION AND CURETTAGE /HYSTEROSCOPY;  Surgeon: Will Bonnet, MD;  Location: [facility];  Service: Gynecology;  Laterality: N/A;  . LAPAROSCOPIC BILATERAL SALPINGO OOPHERECTOMY N/A 10/12/2015   Procedure: LAPAROSCOPIC RIGHT SALPINGECTOMY, LEFT OOPHORECTOMY;  Surgeon: Will Bonnet, MD;  Location: [facility];  Service: Gynecology;  Laterality: N/A;  . LAPAROSCOPIC OVARIAN CYSTECTOMY Bilateral 10/12/2015   Procedure: LAPAROSCOPIC OVARIAN CYSTECTOMY;  Surgeon: Will Bonnet, MD;  Location: [facility];  Service: Gynecology;  Laterality: Bilateral;  . OOPHORECTOMY    . OVARIAN CYST REMOVAL      Family History  Problem Relation Age of Onset  . Hypertension Mother   . Diabetes Mother   . CAD Mother   . Cancer Mother        cervical  . Heart attack Mother 82       Triple Bypass  . CAD Father   . Hypertension Father   . Arthritis Father   . Thyroid disease Sister   . Alzheimer's disease Maternal Grandmother   . Alzheimer's disease Maternal Grandfather   . Breast cancer Paternal Grandmother     Social History   Socioeconomic History  . Marital status: Married    Spouse name: Kelsey Alvarez  . Number of children: 0  . Years of education: Not  on file  . Highest education level: Some college, no degree  Occupational History  . Occupation: Museum/gallery curator  Social Needs  . Financial resource strain: Not hard at all  . Food insecurity:    Worry: Never true    Inability: Never true  . Transportation needs:    Medical: No    Non-medical: No  Tobacco Use  . Smoking status: Former Smoker    Years: 1.00    Types: Cigarettes    Start date: 12/03/2001    Last attempt to  quit: 12/28/2002    Years since quitting: 15.5  . Smokeless tobacco: Never Used  . Tobacco comment: smoked 3 to 4 cigarrettes a day for a year  Substance and Sexual Activity  . Alcohol use: Yes    Alcohol/week: 0.0 standard drinks    Comment: occasionally drinks margarita, once or twice a year  . Drug use: No  . Sexual activity: Yes    Partners: Male    Comment: Partial Hysterectomy  Lifestyle  . Physical activity:    Days per week: 1 day    Minutes per session: 30 min  . Stress: Not at all  Relationships  . Social connections:    Talks on phone: Three times a week    Gets together: Never    Attends religious service: More than 4 times per year    Active member of club or organization: No    Attends meetings of clubs or organizations: Never    Relationship status: Married  . Intimate partner violence:    Fear of current or ex partner: No    Emotionally abused: No    Physically abused: No    Forced sexual activity: No  Other Topics Concern  . Not on file  Social History Narrative  . Not on file     Current Outpatient Medications:  .  Alpha Lipoic Acid 200 MG CAPS, Take by mouth., Disp: , Rfl:  .  aspirin EC 81 MG tablet, Take 1 tablet (81 mg total) by mouth daily., Disp: 30 tablet, Rfl: 0 .  atorvastatin (LIPITOR) 40 MG tablet, Take 1 tablet (40 mg total) by mouth daily., Disp: 90 tablet, Rfl: 1 .  budesonide-formoterol (SYMBICORT) 160-4.5 MCG/ACT inhaler, Inhale 2 puffs into the lungs 2 (two) times daily., Disp: 3 Inhaler, Rfl: 1 .  Cholecalciferol (VITAMIN D) 2000 UNITS tablet, Take 1 tablet by mouth daily., Disp: , Rfl:  .  dapagliflozin propanediol (FARXIGA) 10 MG TABS tablet, Take 10 mg by mouth daily., Disp: 90 tablet, Rfl: 1 .  fluticasone (FLONASE) 50 MCG/ACT nasal spray, Place 2 sprays into both nostrils daily., Disp: 16 g, Rfl: 5 .  gabapentin (NEURONTIN) 100 MG capsule, Take 1 capsule by mouth every morning., Disp: , Rfl:  .  ipratropium-albuterol (DUONEB)  0.5-2.5 (3) MG/3ML SOLN, Take 3 mLs by nebulization every 4 (four) hours as needed., Disp: 120 mL, Rfl: 0 .  loratadine (CLARITIN) 10 MG tablet, Take 10 mg by mouth daily., Disp: , Rfl:  .  meloxicam (MOBIC) 15 MG tablet, Take 1 tablet (15 mg total) by mouth daily., Disp: 90 tablet, Rfl: 0 .  montelukast (SINGULAIR) 10 MG tablet, Take 1 tablet (10 mg total) by mouth at bedtime., Disp: 90 tablet, Rfl: 1 .  olmesartan-hydrochlorothiazide (BENICAR HCT) 20-12.5 MG tablet, Take 1 tablet by mouth daily., Disp: 90 tablet, Rfl: 1 .  omeprazole (PRILOSEC) 10 MG capsule, Take 1 capsule (10 mg total) by mouth daily., Disp: 30 capsule, Rfl: 0 .  PROAIR HFA 108 (90 Base) MCG/ACT inhaler, INHALE 2 PUFFS BY MOUTH EVERY 6 HOURS AS NEEDED FOR WHEEZING OR SHORTNESS OF BREATH, Disp: 18 g, Rfl: 0 .  gabapentin (NEURONTIN) 300 MG capsule, Take 1 capsule (300 mg total) by mouth 3 (three) times daily. (Patient taking differently: Take 300 mg by mouth at bedtime. ), Disp: 90 capsule, Rfl: 2 .  nitrofurantoin, macrocrystal-monohydrate, (MACROBID) 100 MG capsule, Take 1 capsule (100 mg total) by mouth 2 (two) times daily., Disp: 10 capsule, Rfl: 0 .  phenazopyridine (PYRIDIUM) 100 MG tablet, Take 1 tablet (100 mg total) by mouth 3 (three) times daily as needed for pain., Disp: 6 tablet, Rfl: 0  Allergies  Allergen Reactions  . Fish Allergy Shortness Of Breath  . Penicillins Anaphylaxis and Rash    Childhood allergy  . Levofloxacin Other (See Comments)    Cramping  . Adhesive [Tape] Rash    Paper tape ok to use.    I personally reviewed active problem list, medication list, allergies, family history, social history with the patient/caregiver today.   ROS  Ten systems reviewed and is negative except as mentioned in HPI   Objective  Virtual encounter, vitals not obtained.  There is no height or weight on file to calculate BMI.  Physical Exam  Awake, alert and oriented   PHQ2/9: Depression screen [facility] 2/9  06/26/2018 04/25/2018 01/20/2018 12/24/2017 10/17/2017  Decreased Interest 0 0 0 0 2  Down, Depressed, Hopeless 0 0 0 0 2  PHQ - 2 Score 0 0 0 0 4  Altered sleeping 0 - 3 1 1   Tired, decreased energy 0 - 1 0 1  Change in appetite 0 - 0 3 0  Feeling bad or failure about yourself  0 - 0 0 1  Trouble concentrating 0 - 0 0 2  Moving slowly or fidgety/restless 0 - 0 0 0  Suicidal thoughts 0 - 0 0 0  PHQ-9 Score 0 - 4 4 9   Difficult doing work/chores Not difficult at all - Not difficult at all Not difficult at all Not difficult at all   PHQ-2/9 Result is negative.    Fall Risk: Fall Risk  06/26/2018 04/25/2018 01/20/2018 12/24/2017 10/17/2017  Falls in the past year? 0 0 0 No No  Number falls in past yr: 0 - - - -  Injury with Fall? 0 - - - -  Follow up Falls evaluation completed - - - -     Assessment & Plan  1. Dysuria  - nitrofurantoin, macrocrystal-monohydrate, (MACROBID) 100 MG capsule; Take 1 capsule (100 mg total) by mouth 2 (two) times daily.  Dispense: 10 capsule; Refill: 0 - phenazopyridine (PYRIDIUM) 100 MG tablet; Take 1 tablet (100 mg total) by mouth 3 (three) times daily as needed for pain.  Dispense: 6 tablet; Refill: 0  I discussed the assessment and treatment plan with the patient. The patient was provided an opportunity to ask questions and all were answered. The patient agreed with the plan and demonstrated an understanding of the instructions.  The patient was advised to call back or seek an in-person evaluation if the symptoms worsen or if the condition fails to improve as anticipated.  I provided 16 minutes of non-face-to-face time during this encounter.

## 2018-07-24 ENCOUNTER — Ambulatory Visit: Payer: 59 | Admitting: Family Medicine

## 2018-07-31 ENCOUNTER — Other Ambulatory Visit: Payer: Self-pay | Admitting: Family Medicine

## 2018-07-31 DIAGNOSIS — J4540 Moderate persistent asthma, uncomplicated: Secondary | ICD-10-CM

## 2018-08-27 ENCOUNTER — Other Ambulatory Visit: Payer: Self-pay | Admitting: Family Medicine

## 2018-08-27 DIAGNOSIS — E1129 Type 2 diabetes mellitus with other diabetic kidney complication: Secondary | ICD-10-CM

## 2018-08-27 DIAGNOSIS — R809 Proteinuria, unspecified: Secondary | ICD-10-CM

## 2018-09-10 ENCOUNTER — Ambulatory Visit: Payer: 59 | Admitting: Family Medicine

## 2018-09-10 ENCOUNTER — Other Ambulatory Visit: Payer: Self-pay

## 2018-09-10 ENCOUNTER — Encounter: Payer: Self-pay | Admitting: Family Medicine

## 2018-09-10 VITALS — BP 110/60 | HR 90 | Temp 97.3°F | Resp 16 | Ht 62.0 in | Wt 269.8 lb

## 2018-09-10 DIAGNOSIS — M545 Low back pain, unspecified: Secondary | ICD-10-CM

## 2018-09-10 DIAGNOSIS — I10 Essential (primary) hypertension: Secondary | ICD-10-CM

## 2018-09-10 DIAGNOSIS — E1169 Type 2 diabetes mellitus with other specified complication: Secondary | ICD-10-CM

## 2018-09-10 DIAGNOSIS — E1142 Type 2 diabetes mellitus with diabetic polyneuropathy: Secondary | ICD-10-CM

## 2018-09-10 DIAGNOSIS — R809 Proteinuria, unspecified: Secondary | ICD-10-CM | POA: Diagnosis not present

## 2018-09-10 DIAGNOSIS — E785 Hyperlipidemia, unspecified: Secondary | ICD-10-CM

## 2018-09-10 DIAGNOSIS — K219 Gastro-esophageal reflux disease without esophagitis: Secondary | ICD-10-CM | POA: Diagnosis not present

## 2018-09-10 DIAGNOSIS — E1129 Type 2 diabetes mellitus with other diabetic kidney complication: Secondary | ICD-10-CM | POA: Diagnosis not present

## 2018-09-10 DIAGNOSIS — M7581 Other shoulder lesions, right shoulder: Secondary | ICD-10-CM

## 2018-09-10 DIAGNOSIS — J4540 Moderate persistent asthma, uncomplicated: Secondary | ICD-10-CM

## 2018-09-10 DIAGNOSIS — E559 Vitamin D deficiency, unspecified: Secondary | ICD-10-CM

## 2018-09-10 LAB — POCT GLYCOSYLATED HEMOGLOBIN (HGB A1C): Hemoglobin A1C: 6.6 % — AB (ref 4.0–5.6)

## 2018-09-10 MED ORDER — MELOXICAM 15 MG PO TABS: 15.0000 mg | ORAL_TABLET | Freq: Every day | ORAL | 0 refills | Status: DC

## 2018-09-10 MED ORDER — BUDESONIDE-FORMOTEROL FUMARATE 160-4.5 MCG/ACT IN AERO: 2.0000 | INHALATION_SPRAY | Freq: Two times a day (BID) | RESPIRATORY_TRACT | 1 refills | Status: DC

## 2018-09-10 MED ORDER — OMEPRAZOLE 20 MG PO CPDR: 20.0000 mg | DELAYED_RELEASE_CAPSULE | Freq: Every day | ORAL | 0 refills | Status: DC

## 2018-09-10 MED ORDER — ATORVASTATIN CALCIUM 40 MG PO TABS: 40.0000 mg | ORAL_TABLET | Freq: Every day | ORAL | 1 refills | Status: DC

## 2018-09-10 NOTE — Progress Notes (Signed)
Name: Kelsey Alvarez   MRN: [id number]    DOB: 17-Aug-1970   Date:09/10/2018       Progress Note  Subjective  Chief Complaint  Chief Complaint  Patient presents with  . Diabetes  . Hyperlipidemia  . Hypertension    HPI  QA:[id number] has been controlled, second time at 6.6% she was switched from [place] to [place] but that cause dyspesia also and is on Farxiga now and A1C is at goal and patient is tolerating medication well. She is on ARB for microalbuminuria and gabapentin for neuropathy. She denies side effects of medication. She states gabapentin seems to be helping with symptoms. She states it feels like a stinging sensation on her toes but intermittent   Right shoulder pain: she went to [facility] on 01/19/2018 with severe right shoulder pain, no history of trauma.Initially was lateral shoulder and radiating to right trapezium and dull. We placed referral Ortho but symptoms had improved by the time she got the phone call and she decided not to go, she states pain is getting worse again, aggravated by activity.   Palpitation: no recent episodes    Low back pain with radiculitis: went to [facility] back in 08/2017, after that referred to Neurologist and had EMG that showed neuropathy, being treated with gabapentin, she states pain is no longer constant, but still daily aching intermittent . She states symptoms much worse during her cycle.  Stable She has DDD it may also be secondary to endometriosis or fibroid tumors   Morbid Obesity: weight is stable, off Ozempic, not consistently very active, she is still drinking sodas but replacing it with coffee or sweet tea ( advised to use sugar substitute )  but since she is no longer working she has been cooking at home   Asthma:she has moderate asthma,she is using Symbicort but still once a day, she states humidity has been hard on her, she has a nocturnal cough a few times a week, only has wheezing or SOB with moderate activity or when outside in the  heat  Hyperlipidemia:she states she has been taking it daily now, and is tolerating it well. Reviewed last labs with patient, recheck this Fall   HTN: continue medication  Palpitations resolved.No side effects of medication. Compliant with medication BP at home usually 120'/80's, today it is a little lower than usual. She states sometimes she gets dizzy when she gets up in the morning, but symptoms controlled if she gets up slowly   Fibroid tumors: she has heavy bleeding during cycles, could not tolerate ocp, sheis under the care of Dr. Glennon Mac. Unchanged   Patient Active Problem List   Diagnosis Date Noted  . Type 2 diabetes, controlled, with peripheral neuropathy ([place]) 01/20/2018  . Endometriosis 11/06/2017  . Menorrhagia with irregular cycle 10/28/2017  . Fibroid uterus 10/28/2017  . Carpal tunnel syndrome on both sides 10/17/2017  . B12 deficiency 04/27/2016  . History of endometriosis 11/17/2015  . Oligomenorrhea 10/12/2015  . Benign essential HTN 12/28/2014  . Cervical polyp 12/28/2014  . Controlled type 2 diabetes mellitus with microalbuminuria ([place]) 12/28/2014  . Dyslipidemia 12/28/2014  . Dysmenorrhea 12/28/2014  . Edema 12/28/2014  . Female infertility 12/28/2014  . Gastro-esophageal reflux disease without esophagitis 12/28/2014  . Morbid obesity ([place]) 12/28/2014  . Asthma, moderate persistent, poorly-controlled 12/28/2014  . Perennial allergic rhinitis 12/28/2014  . Intermittent tremor 12/28/2014  . Vitamin D deficiency 12/28/2014  . LBP (low back pain) 12/28/2014  . Left sciatic nerve pain 12/28/2014  Past Surgical History:  Procedure Laterality Date  . APPENDECTOMY    . cervical polyp removal    . DILATION AND CURETTAGE OF UTERUS    . HYSTEROSCOPY    . HYSTEROSCOPY W/D&C N/A 10/12/2015   Procedure: DILATATION AND CURETTAGE /HYSTEROSCOPY;  Surgeon: Will Bonnet, MD;  Location: [facility];  Service: Gynecology;  Laterality: N/A;  . LAPAROSCOPIC BILATERAL  SALPINGO OOPHERECTOMY N/A 10/12/2015   Procedure: LAPAROSCOPIC RIGHT SALPINGECTOMY, LEFT OOPHORECTOMY;  Surgeon: Will Bonnet, MD;  Location: [facility];  Service: Gynecology;  Laterality: N/A;  . LAPAROSCOPIC OVARIAN CYSTECTOMY Bilateral 10/12/2015   Procedure: LAPAROSCOPIC OVARIAN CYSTECTOMY;  Surgeon: Will Bonnet, MD;  Location: [facility];  Service: Gynecology;  Laterality: Bilateral;  . OOPHORECTOMY    . OVARIAN CYST REMOVAL      Family History  Problem Relation Age of Onset  . Hypertension Mother   . Diabetes Mother   . CAD Mother   . Cancer Mother        cervical  . Heart attack Mother 57       Triple Bypass  . CAD Father   . Hypertension Father   . Arthritis Father   . Thyroid disease Sister   . Alzheimer's disease Maternal Grandmother   . Alzheimer's disease Maternal Grandfather   . Breast cancer Paternal Grandmother     Social History   Socioeconomic History  . Marital status: Married    Spouse name: Kasandra Knudsen  . Number of children: 0  . Years of education: Not on file  . Highest education level: Some college, no degree  Occupational History  . Occupation: Museum/gallery curator  Social Needs  . Financial resource strain: Not hard at all  . Food insecurity    Worry: Never true    Inability: Never true  . Transportation needs    Medical: No    Non-medical: No  Tobacco Use  . Smoking status: Former Smoker    Years: 1.00    Types: Cigarettes    Start date: 12/03/2001    Quit date: 12/28/2002    Years since quitting: 15.7  . Smokeless tobacco: Never Used  . Tobacco comment: smoked 3 to 4 cigarrettes a day for a year  Substance and Sexual Activity  . Alcohol use: Yes    Alcohol/week: 0.0 standard drinks    Comment: occasionally drinks margarita, once or twice a year  . Drug use: No  . Sexual activity: Yes    Partners: Male    Comment: Partial Hysterectomy  Lifestyle  . Physical activity    Days per week: 1 day    Minutes per session: 30 min  . Stress: Not at  all  Relationships  . Social Herbalist on phone: Three times a week    Gets together: Never    Attends religious service: More than 4 times per year    Active member of club or organization: No    Attends meetings of clubs or organizations: Never    Relationship status: Married  . Intimate partner violence    Fear of current or ex partner: No    Emotionally abused: No    Physically abused: No    Forced sexual activity: No  Other Topics Concern  . Not on file  Social History Narrative  . Not on file     Current Outpatient Medications:  .  Alpha Lipoic Acid 200 MG CAPS, Take by mouth., Disp: , Rfl:  .  aspirin EC 81  MG tablet, Take 1 tablet (81 mg total) by mouth daily., Disp: 30 tablet, Rfl: 0 .  atorvastatin (LIPITOR) 40 MG tablet, Take 1 tablet (40 mg total) by mouth daily., Disp: 90 tablet, Rfl: 1 .  budesonide-formoterol (SYMBICORT) 160-4.5 MCG/ACT inhaler, Inhale 2 puffs into the lungs 2 (two) times daily., Disp: 3 Inhaler, Rfl: 1 .  Cholecalciferol (VITAMIN D) 2000 UNITS tablet, Take 1 tablet by mouth daily., Disp: , Rfl:  .  FARXIGA 10 MG TABS tablet, Take 1 tablet by mouth once daily, Disp: 30 tablet, Rfl: 0 .  fluticasone (FLONASE) 50 MCG/ACT nasal spray, Place 2 sprays into both nostrils daily., Disp: 16 g, Rfl: 5 .  gabapentin (NEURONTIN) 100 MG capsule, Take 1 capsule by mouth every morning., Disp: , Rfl:  .  ipratropium-albuterol (DUONEB) 0.5-2.5 (3) MG/3ML SOLN, Take 3 mLs by nebulization every 4 (four) hours as needed., Disp: 120 mL, Rfl: 0 .  loratadine (CLARITIN) 10 MG tablet, Take 10 mg by mouth daily., Disp: , Rfl:  .  meloxicam (MOBIC) 15 MG tablet, Take 1 tablet (15 mg total) by mouth daily., Disp: 90 tablet, Rfl: 0 .  montelukast (SINGULAIR) 10 MG tablet, TAKE 1 TABLET BY MOUTH AT BEDTIME, Disp: 90 tablet, Rfl: 1 .  olmesartan-hydrochlorothiazide (BENICAR HCT) 20-12.5 MG tablet, Take 1 tablet by mouth daily., Disp: 90 tablet, Rfl: 1 .  omeprazole  (PRILOSEC) 10 MG capsule, Take 1 capsule (10 mg total) by mouth daily., Disp: 30 capsule, Rfl: 0 .  phenazopyridine (PYRIDIUM) 100 MG tablet, Take 1 tablet (100 mg total) by mouth 3 (three) times daily as needed for pain., Disp: 6 tablet, Rfl: 0 .  PROAIR HFA 108 (90 Base) MCG/ACT inhaler, INHALE 2 PUFFS BY MOUTH EVERY 6 HOURS AS NEEDED FOR WHEEZING OR SHORTNESS OF BREATH, Disp: 18 g, Rfl: 0 .  gabapentin (NEURONTIN) 300 MG capsule, Take 1 capsule (300 mg total) by mouth 3 (three) times daily. (Patient taking differently: Take 300 mg by mouth at bedtime. ), Disp: 90 capsule, Rfl: 2 .  nitrofurantoin, macrocrystal-monohydrate, (MACROBID) 100 MG capsule, Take 1 capsule (100 mg total) by mouth 2 (two) times daily. (Patient not taking: Reported on 09/10/2018), Disp: 10 capsule, Rfl: 0  Allergies  Allergen Reactions  . Fish Allergy Shortness Of Breath  . Penicillins Anaphylaxis and Rash    Childhood allergy  . Levofloxacin Other (See Comments)    Cramping  . Adhesive [Tape] Rash    Paper tape ok to use.    I personally reviewed active problem list, medication list, allergies, family history, social history with the patient/caregiver today.   ROS  Constitutional: Negative for fever or weight change.  Respiratory: positive for cough and shortness of breath  Cardiovascular: Negative for chest pain or palpitations.  Gastrointestinal: Negative for abdominal pain, no bowel changes.  Musculoskeletal: Negative for gait problem or joint swelling.  Skin: Negative for rash.  Neurological: positive for dizziness and very seldom has a  headache.  No other specific complaints in a complete review of systems (except as listed in HPI above).  Objective  Vitals:   09/10/18 0809  BP: 110/60  Pulse: 90  Resp: 16  Temp: (!) 97.3 F (36.3 C)  TempSrc: Temporal  SpO2: 98%  Weight: 269 lb 12.8 oz (122.4 kg)  Height: 5\' 2"  (1.575 m)    Body mass index is 49.35 kg/m.  Physical  Exam  Constitutional: Patient appears well-developed and well-nourished. Obese No distress.  HEENT: head atraumatic, normocephalic, pupils equal  and reactive to light,  neck supple, oral mucosa not done  Cardiovascular: Normal rate, regular rhythm and normal heart sounds.  No murmur heard. No BLE edema. Pulmonary/Chest: Effort normal and breath sounds normal. No respiratory distress. Abdominal: Soft.  There is no tenderness. Psychiatric: Patient has a normal mood and affect. behavior is normal. Judgment and thought content normal.  Recent Results (from the past 2160 hour(s))  POCT HgB A1C     Status: Abnormal   Collection Time: 09/10/18  8:20 AM  Result Value Ref Range   Hemoglobin A1C 6.6 (A) 4.0 - 5.6 %   HbA1c POC (<> result, manual entry)     HbA1c, POC (prediabetic range)     HbA1c, POC (controlled diabetic range)       PHQ2/9: Depression screen [facility] 2/9 09/10/2018 06/26/2018 04/25/2018 01/20/2018 12/24/2017  Decreased Interest 0 0 0 0 0  Down, Depressed, Hopeless 0 0 0 0 0  PHQ - 2 Score 0 0 0 0 0  Altered sleeping 0 0 - 3 1  Tired, decreased energy 0 0 - 1 0  Change in appetite 0 0 - 0 3  Feeling bad or failure about yourself  0 0 - 0 0  Trouble concentrating 0 0 - 0 0  Moving slowly or fidgety/restless 0 0 - 0 0  Suicidal thoughts 0 0 - 0 0  PHQ-9 Score 0 0 - 4 4  Difficult doing work/chores - Not difficult at all - Not difficult at all Not difficult at all    phq 9 is negative   Fall Risk: Fall Risk  09/10/2018 06/26/2018 04/25/2018 01/20/2018 12/24/2017  Falls in the past year? 0 0 0 0 No  Number falls in past yr: 0 0 - - -  Injury with Fall? 0 0 - - -  Follow up - Falls evaluation completed - - -    Functional Status Survey: Is the patient deaf or have difficulty hearing?: No Does the patient have difficulty seeing, even when wearing glasses/contacts?: No Does the patient have difficulty concentrating, remembering, or making decisions?: No Does the patient have  difficulty walking or climbing stairs?: No Does the patient have difficulty dressing or bathing?: No Does the patient have difficulty doing errands alone such as visiting a doctor's office or shopping?: No    Assessment & Plan   1. Controlled type 2 diabetes mellitus with microalbuminuria, without long-term current use of insulin (HCC)  - POCT HgB A1C  2. Benign essential HTN  Continue medication   3. Type 2 diabetes, controlled, with peripheral neuropathy (HCC)  On gabapentin  4. Gastro-esophageal reflux disease without esophagitis  - omeprazole (PRILOSEC) 20 MG capsule; Take 1 capsule (20 mg total) by mouth daily.  Dispense: 90 capsule; Refill: 0  5. Morbid obesity ([place])  Discussed with the patient the risk posed by an increased BMI. Discussed importance of portion control, calorie counting and at least 150 minutes of physical activity weekly. Avoid sweet beverages and drink more water. Eat at least 6 servings of fruit and vegetables daily   6. Asthma, moderate persistent, poorly-controlled  - budesonide-formoterol (SYMBICORT) 160-4.5 MCG/ACT inhaler; Inhale 2 puffs into the lungs 2 (two) times daily.  Dispense: 3 Inhaler; Refill: 1  7. Vitamin D deficiency  Continue supplements   8. Intermittent low back pain  Continue prn meloxicam  9. Dyslipidemia associated with type 2 diabetes mellitus (HCC)  - atorvastatin (LIPITOR) 40 MG tablet; Take 1 tablet (40 mg total) by mouth daily.  Dispense: 90 tablet; Refill: 1  10. Rotator cuff tendinitis, right  - meloxicam (MOBIC) 15 MG tablet; Take 1 tablet (15 mg total) by mouth daily.  Dispense: 90 tablet; Refill: 0

## 2018-09-10 NOTE — Patient Instructions (Signed)
Food Choices for Gastroesophageal Reflux Disease, Adult When you have gastroesophageal reflux disease (GERD), the foods you eat and your eating habits are very important. Choosing the right foods can help ease the discomfort of GERD. Consider working with a diet and nutrition specialist (dietitian) to help you make healthy food choices. What general guidelines should I follow?  Eating plan  Choose healthy foods low in fat, such as fruits, vegetables, whole grains, low-fat dairy products, and lean meat, fish, and poultry.  Eat frequent, small meals instead of three large meals each day. Eat your meals slowly, in a relaxed setting. Avoid bending over or lying down until 2-3 hours after eating.  Limit high-fat foods such as fatty meats or fried foods.  Limit your intake of oils, butter, and shortening to less than 8 teaspoons each day.  Avoid the following: ? Foods that cause symptoms. These may be different for different people. Keep a food diary to keep track of foods that cause symptoms. ? Alcohol. ? Drinking large amounts of liquid with meals. ? Eating meals during the 2-3 hours before bed.  Cook foods using methods other than frying. This may include baking, grilling, or broiling. Lifestyle  Maintain a healthy weight. Ask your health care provider what weight is healthy for you. If you need to lose weight, work with your health care provider to do so safely.  Exercise for at least 30 minutes on 5 or more days each week, or as told by your health care provider.  Avoid wearing clothes that fit tightly around your waist and chest.  Do not use any products that contain nicotine or tobacco, such as cigarettes and e-cigarettes. If you need help quitting, ask your health care provider.  Sleep with the head of your bed raised. Use a wedge under the mattress or blocks under the bed frame to raise the head of the bed. What foods are not recommended? The items listed may not be a complete  list. Talk with your dietitian about what dietary choices are best for you. Grains Pastries or quick breads with added fat. French toast. Vegetables Deep fried vegetables. French fries. Any vegetables prepared with added fat. Any vegetables that cause symptoms. For some people this may include tomatoes and tomato products, chili peppers, onions and garlic, and horseradish. Fruits Any fruits prepared with added fat. Any fruits that cause symptoms. For some people this may include citrus fruits, such as oranges, grapefruit, pineapple, and lemons. Meats and other protein foods High-fat meats, such as fatty beef or pork, hot dogs, ribs, ham, sausage, salami and bacon. Fried meat or protein, including fried fish and fried chicken. Nuts and nut butters. Dairy Whole milk and chocolate milk. Sour cream. Cream. Ice cream. Cream cheese. Milk shakes. Beverages Coffee and tea, with or without caffeine. Carbonated beverages. Sodas. Energy drinks. Fruit juice made with acidic fruits (such as orange or grapefruit). Tomato juice. Alcoholic drinks. Fats and oils Butter. Margarine. Shortening. Ghee. Sweets and desserts Chocolate and cocoa. Donuts. Seasoning and other foods Pepper. Peppermint and spearmint. Any condiments, herbs, or seasonings that cause symptoms. For some people, this may include curry, hot sauce, or vinegar-based salad dressings. Summary  When you have gastroesophageal reflux disease (GERD), food and lifestyle choices are very important to help ease the discomfort of GERD.  Eat frequent, small meals instead of three large meals each day. Eat your meals slowly, in a relaxed setting. Avoid bending over or lying down until 2-3 hours after eating.  Limit high-fat   foods such as fatty meat or fried foods. This information is not intended to replace advice given to you by your health care provider. Make sure you discuss any questions you have with your health care provider. Document Released:  02/19/2005 Document Revised: 06/12/2018 Document Reviewed: 02/21/2016 Elsevier Patient Education  2020 [employer].

## 2018-09-29 ENCOUNTER — Other Ambulatory Visit: Payer: Self-pay | Admitting: Family Medicine

## 2018-09-29 DIAGNOSIS — R809 Proteinuria, unspecified: Secondary | ICD-10-CM

## 2018-09-29 DIAGNOSIS — E1129 Type 2 diabetes mellitus with other diabetic kidney complication: Secondary | ICD-10-CM

## 2018-10-28 ENCOUNTER — Other Ambulatory Visit: Payer: Self-pay | Admitting: Family Medicine

## 2018-10-28 DIAGNOSIS — I10 Essential (primary) hypertension: Secondary | ICD-10-CM

## 2018-12-11 ENCOUNTER — Ambulatory Visit: Payer: 59 | Admitting: Family Medicine

## 2018-12-29 ENCOUNTER — Other Ambulatory Visit: Payer: Self-pay

## 2018-12-29 DIAGNOSIS — J4540 Moderate persistent asthma, uncomplicated: Secondary | ICD-10-CM

## 2018-12-29 MED ORDER — MONTELUKAST SODIUM 10 MG PO TABS: 10.0000 mg | ORAL_TABLET | Freq: Every day | ORAL | 0 refills | Status: DC

## 2018-12-29 NOTE — Telephone Encounter (Signed)
Refill request for general medication. [facility] to [place]  Last office visit 09/10/2018   No follow-ups on file.

## 2019-01-06 ENCOUNTER — Ambulatory Visit: Payer: 59 | Admitting: Family Medicine

## 2019-01-06 ENCOUNTER — Other Ambulatory Visit: Payer: Self-pay

## 2019-01-06 ENCOUNTER — Encounter: Payer: Self-pay | Admitting: Family Medicine

## 2019-01-06 VITALS — BP 142/82 | HR 87 | Temp 97.3°F | Resp 16 | Ht 62.0 in | Wt 271.4 lb

## 2019-01-06 DIAGNOSIS — I10 Essential (primary) hypertension: Secondary | ICD-10-CM

## 2019-01-06 DIAGNOSIS — E1142 Type 2 diabetes mellitus with diabetic polyneuropathy: Secondary | ICD-10-CM | POA: Diagnosis not present

## 2019-01-06 DIAGNOSIS — E559 Vitamin D deficiency, unspecified: Secondary | ICD-10-CM

## 2019-01-06 DIAGNOSIS — J4540 Moderate persistent asthma, uncomplicated: Secondary | ICD-10-CM

## 2019-01-06 DIAGNOSIS — E785 Hyperlipidemia, unspecified: Secondary | ICD-10-CM

## 2019-01-06 DIAGNOSIS — H8110 Benign paroxysmal vertigo, unspecified ear: Secondary | ICD-10-CM

## 2019-01-06 DIAGNOSIS — E1169 Type 2 diabetes mellitus with other specified complication: Secondary | ICD-10-CM

## 2019-01-06 DIAGNOSIS — Z1231 Encounter for screening mammogram for malignant neoplasm of breast: Secondary | ICD-10-CM

## 2019-01-06 DIAGNOSIS — E1129 Type 2 diabetes mellitus with other diabetic kidney complication: Secondary | ICD-10-CM

## 2019-01-06 DIAGNOSIS — R809 Proteinuria, unspecified: Secondary | ICD-10-CM

## 2019-01-06 DIAGNOSIS — J3089 Other allergic rhinitis: Secondary | ICD-10-CM

## 2019-01-06 DIAGNOSIS — E538 Deficiency of other specified B group vitamins: Secondary | ICD-10-CM

## 2019-01-06 LAB — POCT UA - MICROALBUMIN: Microalbumin Ur, POC: 20 mg/L

## 2019-01-06 LAB — POCT GLYCOSYLATED HEMOGLOBIN (HGB A1C): Hemoglobin A1C: 7.2 % — AB (ref 4.0–5.6)

## 2019-01-06 MED ORDER — ATORVASTATIN CALCIUM 40 MG PO TABS: 40.0000 mg | ORAL_TABLET | Freq: Every day | ORAL | 3 refills | Status: DC

## 2019-01-06 MED ORDER — OLMESARTAN MEDOXOMIL-HCTZ 20-12.5 MG PO TABS: 1.0000 | ORAL_TABLET | Freq: Every day | ORAL | 3 refills | Status: DC

## 2019-01-06 MED ORDER — BUDESONIDE-FORMOTEROL FUMARATE 160-4.5 MCG/ACT IN AERO: 2.0000 | INHALATION_SPRAY | Freq: Two times a day (BID) | RESPIRATORY_TRACT | 3 refills | Status: DC

## 2019-01-06 MED ORDER — PROAIR HFA 108 (90 BASE) MCG/ACT IN AERS: 2.0000 | INHALATION_SPRAY | RESPIRATORY_TRACT | 0 refills | Status: DC | PRN

## 2019-01-06 MED ORDER — MONTELUKAST SODIUM 10 MG PO TABS: 10.0000 mg | ORAL_TABLET | Freq: Every day | ORAL | 3 refills | Status: DC

## 2019-01-06 MED ORDER — FARXIGA 10 MG PO TABS: 10.0000 mg | ORAL_TABLET | Freq: Every day | ORAL | 3 refills | Status: DC

## 2019-01-06 NOTE — Patient Instructions (Signed)

## 2019-01-06 NOTE — Progress Notes (Signed)
Name: Kelsey Alvarez   MRN: [id number]    DOB: 1970-03-26   Date:01/06/2019       Progress Note  Subjective  Chief Complaint  Chief Complaint  Patient presents with  . Medication Refill  . Diabetes    Checks once to twice weekly Average 115-120  . Ear Pain    States her vertigo has been acting up and would like her ears checked  . Morbid Obesity  . Hypertension  . Low back pain with radiculitis  . Asthma    HPI  [id number] was under control but today it was 7.2%  was switched from [place] toXigduo but that cause dyspesia also and is now on Farxiga. She states she is trying to follow a keto diet with her husband but has been eating a lot of fruit, discussed strawberries/blueberries , explained fruit has a lot of carbohydrates.She is on ARB for microalbuminuria and gabapentin for neuropathy. She denies side effects of medication. She states gabapentin seems to be helping with symptoms. She states it feels like a stinging sensation on her toes but intermittent. Rx of Gabapentin is given by neurologist      Right shoulder pain: she went to [facility] on 01/19/2018 with severe right shoulder pain, no history of trauma.Initially was lateral shoulder and radiating to right trapezium and dull.  She states currently not causing a lot of pain   Low back pain with radiculitis: went to [facility] back in 08/2017, after that referred to Neurologist and had EMG that showed neuropathy, being treated with gabapentin, she states pain is no longer constant, but still daily aching intermittent . She states symptoms much worse during her cycle.Unchanged   Morbid Obesity:off Ozempic, not consistently very active,she states she stopped drinking sweet tea, having sodas seldom, drinking mostly water, and switched from coffee with sugar, to cinnamon and creamer  Asthma:she has moderate asthma, she is currently using Symbicort BID, states symptoms worse when outside in the cold, she has chest tightness when outdoors  around leaves and cold weather. Needs refill of albuterol. She states nocturnal cough has improved, usually only when first gets in bed  BPV: still has intermittent spinning sensation when she rolls in bed, it resolves quickly, discussed Epley maneuver and or referral to ENT and she would like trying exercise at home first   Hyperlipidemia:she states she has been taking it daily now, and is tolerating it well. Reviewed last labs with patient, we will recheck labs today   HTN: continue medicationPalpitations resolved.No side effects of medication. Compliant with medication BP at home usually 120'/80's,  Slightly lower during office visits, but no orthostatic changes   Fibroid tumors: she has heavy bleeding during cycles, could not tolerate ocp, sheis under the care of Dr. Glennon Mac. Unchanged    Patient Active Problem List   Diagnosis Date Noted  . Type 2 diabetes, controlled, with peripheral neuropathy ([place]) 01/20/2018  . Endometriosis 11/06/2017  . Menorrhagia with irregular cycle 10/28/2017  . Fibroid uterus 10/28/2017  . Carpal tunnel syndrome on both sides 10/17/2017  . B12 deficiency 04/27/2016  . History of endometriosis 11/17/2015  . Oligomenorrhea 10/12/2015  . Benign essential HTN 12/28/2014  . Cervical polyp 12/28/2014  . Controlled type 2 diabetes mellitus with microalbuminuria ([place]) 12/28/2014  . Dyslipidemia 12/28/2014  . Dysmenorrhea 12/28/2014  . Edema 12/28/2014  . Female infertility 12/28/2014  . Gastro-esophageal reflux disease without esophagitis 12/28/2014  . Morbid obesity ([place]) 12/28/2014  . Asthma, moderate persistent, poorly-controlled 12/28/2014  . Perennial allergic  rhinitis 12/28/2014  . Intermittent tremor 12/28/2014  . Vitamin D deficiency 12/28/2014  . Intermittent low back pain 12/28/2014  . Left sciatic nerve pain 12/28/2014    Past Surgical History:  Procedure Laterality Date  . APPENDECTOMY    . cervical polyp removal    . DILATION  AND CURETTAGE OF UTERUS    . HYSTEROSCOPY    . HYSTEROSCOPY W/D&C N/A 10/12/2015   Procedure: DILATATION AND CURETTAGE /HYSTEROSCOPY;  Surgeon: Will Bonnet, MD;  Location: [facility] ORS;  Service: Gynecology;  Laterality: N/A;  . LAPAROSCOPIC BILATERAL SALPINGO OOPHERECTOMY N/A 10/12/2015   Procedure: LAPAROSCOPIC RIGHT SALPINGECTOMY, LEFT OOPHORECTOMY;  Surgeon: Will Bonnet, MD;  Location: [facility] ORS;  Service: Gynecology;  Laterality: N/A;  . LAPAROSCOPIC OVARIAN CYSTECTOMY Bilateral 10/12/2015   Procedure: LAPAROSCOPIC OVARIAN CYSTECTOMY;  Surgeon: Will Bonnet, MD;  Location: [facility] ORS;  Service: Gynecology;  Laterality: Bilateral;  . OOPHORECTOMY    . OVARIAN CYST REMOVAL      Family History  Problem Relation Age of Onset  . Hypertension Mother   . Diabetes Mother   . CAD Mother   . Cancer Mother        cervical  . Heart attack Mother 28       Triple Bypass  . CAD Father   . Hypertension Father   . Arthritis Father   . Thyroid disease Sister   . Alzheimer's disease Maternal Grandmother   . Alzheimer's disease Maternal Grandfather   . Breast cancer Paternal Grandmother     Social History   Socioeconomic History  . Marital status: Married    Spouse name: Kasandra Knudsen  . Number of children: 0  . Years of education: Not on file  . Highest education level: Some college, no degree  Occupational History  . Occupation: Museum/gallery curator  Social Needs  . Financial resource strain: Not hard at all  . Food insecurity    Worry: Never true    Inability: Never true  . Transportation needs    Medical: No    Non-medical: No  Tobacco Use  . Smoking status: Former Smoker    Years: 1.00    Types: Cigarettes    Start date: 12/03/2001    Quit date: 12/28/2002    Years since quitting: 16.0  . Smokeless tobacco: Never Used  . Tobacco comment: smoked 3 to 4 cigarrettes a day for a year  Substance and Sexual Activity  . Alcohol use: Yes    Alcohol/week: 0.0 standard drinks    Comment:  occasionally drinks margarita, once or twice a year  . Drug use: No  . Sexual activity: Yes    Partners: Male    Comment: Partial Hysterectomy  Lifestyle  . Physical activity    Days per week: 1 day    Minutes per session: 30 min  . Stress: Not at all  Relationships  . Social Herbalist on phone: Three times a week    Gets together: Never    Attends religious service: More than 4 times per year    Active member of club or organization: No    Attends meetings of clubs or organizations: Never    Relationship status: Married  . Intimate partner violence    Fear of current or ex partner: No    Emotionally abused: No    Physically abused: No    Forced sexual activity: No  Other Topics Concern  . Not on file  Social History Narrative  . Not  on file     Current Outpatient Medications:  .  Alpha Lipoic Acid 200 MG CAPS, Take by mouth., Disp: , Rfl:  .  aspirin EC 81 MG tablet, Take 1 tablet (81 mg total) by mouth daily., Disp: 30 tablet, Rfl: 0 .  atorvastatin (LIPITOR) 40 MG tablet, Take 1 tablet (40 mg total) by mouth daily., Disp: 30 tablet, Rfl: 3 .  budesonide-formoterol (SYMBICORT) 160-4.5 MCG/ACT inhaler, Inhale 2 puffs into the lungs 2 (two) times daily., Disp: 1 Inhaler, Rfl: 3 .  Cetirizine HCl (ZYRTEC PO), Take 10 mg by mouth daily., Disp: , Rfl:  .  Cholecalciferol (VITAMIN D) 2000 UNITS tablet, Take 1 tablet by mouth daily., Disp: , Rfl:  .  dapagliflozin propanediol (FARXIGA) 10 MG TABS tablet, Take 10 mg by mouth daily., Disp: 30 tablet, Rfl: 3 .  fluticasone (FLONASE) 50 MCG/ACT nasal spray, Place 2 sprays into both nostrils daily., Disp: 16 g, Rfl: 5 .  gabapentin (NEURONTIN) 100 MG capsule, Take 1 capsule by mouth every morning., Disp: , Rfl:  .  ipratropium-albuterol (DUONEB) 0.5-2.5 (3) MG/3ML SOLN, Take 3 mLs by nebulization every 4 (four) hours as needed., Disp: 120 mL, Rfl: 0 .  meloxicam (MOBIC) 15 MG tablet, Take 1 tablet (15 mg total) by mouth  daily., Disp: 90 tablet, Rfl: 0 .  montelukast (SINGULAIR) 10 MG tablet, Take 1 tablet (10 mg total) by mouth at bedtime., Disp: 30 tablet, Rfl: 3 .  olmesartan-hydrochlorothiazide (BENICAR HCT) 20-12.5 MG tablet, Take 1 tablet by mouth daily., Disp: 30 tablet, Rfl: 3 .  omeprazole (PRILOSEC) 20 MG capsule, Take 1 capsule (20 mg total) by mouth daily., Disp: 90 capsule, Rfl: 0 .  PROAIR HFA 108 (90 Base) MCG/ACT inhaler, Inhale 2 puffs into the lungs every 4 (four) hours as needed for wheezing or shortness of breath., Disp: 18 g, Rfl: 0 .  gabapentin (NEURONTIN) 300 MG capsule, Take 1 capsule (300 mg total) by mouth 3 (three) times daily. (Patient taking differently: Take 300 mg by mouth at bedtime. ), Disp: 90 capsule, Rfl: 2  Allergies  Allergen Reactions  . Fish Allergy Shortness Of Breath  . Penicillins Anaphylaxis and Rash    Childhood allergy  . Levofloxacin Other (See Comments)    Cramping  . Adhesive [Tape] Rash    Paper tape ok to use.    I personally reviewed active problem list, medication list, allergies, family history, health maintenance with the patient/caregiver today.   ROS  Constitutional: Negative for fever or weight change.  Respiratory: Positive  for cough and shortness of breath.   Cardiovascular: Negative for chest pain or palpitations.  Gastrointestinal: Negative for abdominal pain, no bowel changes.  Musculoskeletal: Negative for gait problem or joint swelling.  Skin: Negative for rash.  Neurological: Negative for dizziness or headache.  No other specific complaints in a complete review of systems (except as listed in HPI above).  Objective  Vitals:   01/06/19 0917  BP: (!) 142/82  Pulse: 87  Resp: 16  Temp: (!) 97.3 F (36.3 C)  TempSrc: Temporal  SpO2: 94%  Weight: 271 lb 6.4 oz (123.1 kg)  Height: 5\' 2"  (1.575 m)    Body mass index is 49.64 kg/m.  Physical Exam  Constitutional: Patient appears well-developed and well-nourished. Obese No  distress.  HEENT: head atraumatic, normocephalic, pupils equal and reactive to light Cardiovascular: Normal rate, regular rhythm and normal heart sounds.  No murmur heard. No BLE edema. Pulmonary/Chest: [place] normal and breath sounds  normal. No respiratory distress. Abdominal: Soft.  There is no tenderness. Psychiatric: Patient has a normal mood and affect. behavior is normal. Judgment and thought content normal.  Recent Results (from the past 2160 hour(s))  POCT HgB A1C     Status: Abnormal   Collection Time: 01/06/19  9:17 AM  Result Value Ref Range   Hemoglobin A1C 7.2 (A) 4.0 - 5.6 %   HbA1c POC (<> result, manual entry)     HbA1c, POC (prediabetic range)     HbA1c, POC (controlled diabetic range)    POCT UA - Microalbumin     Status: Normal   Collection Time: 01/06/19  9:17 AM  Result Value Ref Range   Microalbumin Ur, POC 20 mg/L   Creatinine, POC     Albumin/Creatinine Ratio, Urine, POC      Diabetic Foot Exam: Diabetic Foot Exam - Simple   Simple Foot Form Diabetic Foot exam was performed with the following findings: Yes 01/06/2019  9:42 AM  Visual Inspection No deformities, no ulcerations, no other skin breakdown bilaterally: Yes Sensation Testing See comments: Yes Pulse Check Posterior Tibialis and Dorsalis pulse intact bilaterally: Yes Comments Failed two places on right foot       PHQ2/9: Depression screen [facility] 2/9 01/06/2019 09/10/2018 06/26/2018 04/25/2018 01/20/2018  Decreased Interest 0 0 0 0 0  Down, Depressed, Hopeless 0 0 0 0 0  PHQ - 2 Score 0 0 0 0 0  Altered sleeping 1 0 0 - 3  Tired, decreased energy 3 0 0 - 1  Change in appetite 0 0 0 - 0  Feeling bad or failure about yourself  0 0 0 - 0  Trouble concentrating 0 0 0 - 0  Moving slowly or fidgety/restless 0 0 0 - 0  Suicidal thoughts 0 0 0 - 0  PHQ-9 Score 4 0 0 - 4  Difficult doing work/chores Not difficult at all - Not difficult at all - Not difficult at all    phq 9 is negative   Fall  Risk: Fall Risk  01/06/2019 09/10/2018 06/26/2018 04/25/2018 01/20/2018  Falls in the past year? 0 0 0 0 0  Number falls in past yr: 0 0 0 - -  Injury with Fall? 0 0 0 - -  Follow up - - Falls evaluation completed - -     Functional Status Survey: Is the patient deaf or have difficulty hearing?: No Does the patient have difficulty seeing, even when wearing glasses/contacts?: No Does the patient have difficulty concentrating, remembering, or making decisions?: No Does the patient have difficulty walking or climbing stairs?: No Does the patient have difficulty dressing or bathing?: No Does the patient have difficulty doing errands alone such as visiting a doctor's office or shopping?: No   Assessment & Plan  1. Type 2 diabetes, controlled, with peripheral neuropathy (HCC)  - POCT HgB A1C - POCT UA - Microalbumin  2. Benign essential HTN  - COMPLETE METABOLIC PANEL WITH GFR - olmesartan-hydrochlorothiazide (BENICAR HCT) 20-12.5 MG tablet; Take 1 tablet by mouth daily.  Dispense: 30 tablet; Refill: 3  3. Vitamin D deficiency  Continue otc supplementation   4. Morbid obesity ([place])  Discussed with the patient the risk posed by an increased BMI. Discussed importance of portion control, calorie counting and at least 150 minutes of physical activity weekly. Avoid sweet beverages and drink more water. Eat at least 6 servings of fruit and vegetables daily   5. Asthma, moderate persistent, well-controlled  - PROAIR  HFA 108 (90 Base) MCG/ACT inhaler; Inhale 2 puffs into the lungs every 4 (four) hours as needed for wheezing or shortness of breath.  Dispense: 18 g; Refill: 0 - montelukast (SINGULAIR) 10 MG tablet; Take 1 tablet (10 mg total) by mouth at bedtime.  Dispense: 30 tablet; Refill: 3  6. B12 deficiency  Continue otc supplementation   7. Dyslipidemia  - Lipid panel  8. Benign paroxysmal vertigo, unspecified laterality  She will try doing [place] at home   9. Dyslipidemia  associated with type 2 diabetes mellitus (HCC)  - atorvastatin (LIPITOR) 40 MG tablet; Take 1 tablet (40 mg total) by mouth daily.  Dispense: 30 tablet; Refill: 3  10. Perennial allergic rhinitis  - Cetirizine HCl (ZYRTEC PO); Take 10 mg by mouth daily.  11. Controlled type 2 diabetes mellitus with microalbuminuria, without long-term current use of insulin (HCC)  - dapagliflozin propanediol (FARXIGA) 10 MG TABS tablet; Take 10 mg by mouth daily.  Dispense: 30 tablet; Refill: 3  12. Asthma, moderate persistent, poorly-controlled  - budesonide-formoterol (SYMBICORT) 160-4.5 MCG/ACT inhaler; Inhale 2 puffs into the lungs 2 (two) times daily.  Dispense: 1 Inhaler; Refill: 3  13. Encounter for screening mammogram for breast cancer  - MM 3D SCREEN BREAST BILATERAL; Future

## 2019-01-07 LAB — LIPID PANEL
Cholesterol: 163 mg/dL (ref <200)
HDL: 39 mg/dL — ABNORMAL LOW (ref >=50)
LDL Cholesterol (Calc): 99 mg/dL (calc)
Non-HDL Cholesterol (Calc): 124 mg/dL (calc) (ref <130)
Total CHOL/HDL Ratio: 4.2 (calc) (ref <5.0)
Triglycerides: 158 mg/dL — ABNORMAL HIGH (ref <150)

## 2019-01-07 LAB — COMPLETE METABOLIC PANEL WITH GFR
AG Ratio: 1.4 (calc) (ref 1.0–2.5)
ALT: 18 U/L (ref 6–29)
AST: 14 U/L (ref 10–35)
Albumin: 4 g/dL (ref 3.6–5.1)
Alkaline phosphatase (APISO): 89 U/L (ref 31–125)
BUN: 11 mg/dL (ref 7–25)
CO2: 30 mmol/L (ref 20–32)
Calcium: 9.4 mg/dL (ref 8.6–10.2)
Chloride: 102 mmol/L (ref 98–110)
Creat: 0.78 mg/dL (ref 0.50–1.10)
GFR, Est African American: 106 mL/min/{1.73_m2} (ref >=60)
GFR, Est Non African American: 92 mL/min/{1.73_m2} (ref >=60)
Globulin: 2.9 g/dL (calc) (ref 1.9–3.7)
Glucose, Bld: 131 mg/dL — ABNORMAL HIGH (ref 65–99)
Potassium: 3.7 mmol/L (ref 3.5–5.3)
Sodium: 142 mmol/L (ref 135–146)
Total Bilirubin: 0.4 mg/dL (ref 0.2–1.2)
Total Protein: 6.9 g/dL (ref 6.1–8.1)

## 2019-01-28 ENCOUNTER — Other Ambulatory Visit: Payer: Self-pay | Admitting: Family Medicine

## 2019-01-28 DIAGNOSIS — K219 Gastro-esophageal reflux disease without esophagitis: Secondary | ICD-10-CM

## 2019-04-10 ENCOUNTER — Encounter: Payer: Self-pay | Admitting: Family Medicine

## 2019-04-10 ENCOUNTER — Other Ambulatory Visit: Payer: Self-pay

## 2019-04-10 ENCOUNTER — Ambulatory Visit: Payer: 59 | Admitting: Family Medicine

## 2019-04-10 VITALS — BP 130/80 | HR 92 | Temp 97.1°F | Resp 18 | Ht 62.0 in | Wt 274.7 lb

## 2019-04-10 DIAGNOSIS — I10 Essential (primary) hypertension: Secondary | ICD-10-CM

## 2019-04-10 DIAGNOSIS — E1142 Type 2 diabetes mellitus with diabetic polyneuropathy: Secondary | ICD-10-CM

## 2019-04-10 DIAGNOSIS — E785 Hyperlipidemia, unspecified: Secondary | ICD-10-CM

## 2019-04-10 DIAGNOSIS — E1169 Type 2 diabetes mellitus with other specified complication: Secondary | ICD-10-CM

## 2019-04-10 DIAGNOSIS — K219 Gastro-esophageal reflux disease without esophagitis: Secondary | ICD-10-CM

## 2019-04-10 DIAGNOSIS — J4540 Moderate persistent asthma, uncomplicated: Secondary | ICD-10-CM

## 2019-04-10 DIAGNOSIS — R300 Dysuria: Secondary | ICD-10-CM | POA: Diagnosis not present

## 2019-04-10 LAB — POCT URINALYSIS DIPSTICK
Appearance: NORMAL
Bilirubin, UA: NEGATIVE
Blood, UA: NEGATIVE
Glucose, UA: NEGATIVE
Ketones, UA: NEGATIVE
Leukocytes, UA: NEGATIVE
Nitrite, UA: NEGATIVE
Odor: NORMAL
Protein, UA: NEGATIVE
Spec Grav, UA: 1.015 (ref 1.010–1.025)
Urobilinogen, UA: 0.2 E.U./dL
pH, UA: 5 (ref 5.0–8.0)

## 2019-04-10 LAB — POCT GLYCOSYLATED HEMOGLOBIN (HGB A1C): Hemoglobin A1C: 6.9 % — AB (ref 4.0–5.6)

## 2019-04-10 MED ORDER — SITAGLIPTIN PHOSPHATE 100 MG PO TABS: 100.0000 mg | ORAL_TABLET | Freq: Every day | ORAL | 3 refills | Status: DC

## 2019-04-10 MED ORDER — OLMESARTAN MEDOXOMIL-HCTZ 20-12.5 MG PO TABS: 1.0000 | ORAL_TABLET | Freq: Every day | ORAL | 3 refills | Status: DC

## 2019-04-10 MED ORDER — NITROFURANTOIN MONOHYD MACRO 100 MG PO CAPS: 100.0000 mg | ORAL_CAPSULE | Freq: Two times a day (BID) | ORAL | 0 refills | Status: DC

## 2019-04-10 MED ORDER — MONTELUKAST SODIUM 10 MG PO TABS: 10.0000 mg | ORAL_TABLET | Freq: Every day | ORAL | 3 refills | Status: DC

## 2019-04-10 MED ORDER — BUDESONIDE-FORMOTEROL FUMARATE 160-4.5 MCG/ACT IN AERO: 2.0000 | INHALATION_SPRAY | Freq: Two times a day (BID) | RESPIRATORY_TRACT | 3 refills | Status: DC

## 2019-04-10 MED ORDER — ALBUTEROL SULFATE HFA 108 (90 BASE) MCG/ACT IN AERS: 2.0000 | INHALATION_SPRAY | Freq: Four times a day (QID) | RESPIRATORY_TRACT | 0 refills | Status: DC | PRN

## 2019-04-10 MED ORDER — ATORVASTATIN CALCIUM 40 MG PO TABS: 40.0000 mg | ORAL_TABLET | Freq: Every day | ORAL | 3 refills | Status: DC

## 2019-04-10 MED ORDER — OMEPRAZOLE 20 MG PO CPDR: 20.0000 mg | DELAYED_RELEASE_CAPSULE | Freq: Every day | ORAL | 3 refills | Status: DC

## 2019-04-10 NOTE — Progress Notes (Signed)
px

## 2019-04-10 NOTE — Progress Notes (Signed)
Name: Kelsey Alvarez   MRN: [id number]    DOB: 1970/12/31   Date:04/10/2019       Progress Note  Subjective  Chief Complaint  Chief Complaint  Patient presents with  . Diabetes  . Hypertension  . Asthma  . Medication Refill  . Dysuria    Started on Sunday-Dysuria, lower abdominal pain. Has been increasing water and cranberry intake.    HPI  [id number] was 7.2% but is now down to 6.9% she  was switched from [place] toXigduo but that cause dyspesia also and is now on Farxiga, but has noticed that shortly after taking medication she feels " weird" affects her vision, does not like it, we will try Januvia but explained that it may not control her glucose as much and will need to be very compliant with her diet .She still has neuropathy symptoms ( hands and feet) but doing better on gabapentin   Low back pain with radiculitis: went to [facility] back in 08/2017, after that referred to Neurologist and had EMG that showed neuropathy, being treated with gabapentin,symptoms are intermittent, still taking Gabapentin   Morbid Obesity:off Ozempic, weight is stable since last visit, she has been drinking water, she is back drinking sodas, one 20 ounce bottle per day. Discussed avoiding sweet beverages again   Asthma:she has moderate asthma, she is currently using Symbicort BID, states symptoms worse when outside in the cold, also when exposed to strong smells/odor  Cough is not as severe, still dry but not throughout the day.   Hyperlipidemia:she states she has been taking Atorvastatin  daily now, and is tolerating it well. Last LDL was below 100   HTN: continue medicationPalpitationsre-started over the past week, but mild and intermittent.No side effects of medication. Compliant with medication BP at home usually 120'/80's. BP at goal today   Fibroid tumors: she has heavy bleeding during cycles, could not tolerate ocp, sheis under the care of Dr. Glennon Mac. She will schedule her physical with  him soon    Patient Active Problem List   Diagnosis Date Noted  . Type 2 diabetes, controlled, with peripheral neuropathy ([place]) 01/20/2018  . Endometriosis 11/06/2017  . Menorrhagia with irregular cycle 10/28/2017  . Fibroid uterus 10/28/2017  . Carpal tunnel syndrome on both sides 10/17/2017  . B12 deficiency 04/27/2016  . History of endometriosis 11/17/2015  . Oligomenorrhea 10/12/2015  . Benign essential HTN 12/28/2014  . Cervical polyp 12/28/2014  . Controlled type 2 diabetes mellitus with microalbuminuria ([place]) 12/28/2014  . Dyslipidemia 12/28/2014  . Dysmenorrhea 12/28/2014  . Edema 12/28/2014  . Female infertility 12/28/2014  . Gastro-esophageal reflux disease without esophagitis 12/28/2014  . Morbid obesity ([place]) 12/28/2014  . Asthma, moderate persistent, poorly-controlled 12/28/2014  . Perennial allergic rhinitis 12/28/2014  . Intermittent tremor 12/28/2014  . Vitamin D deficiency 12/28/2014  . Intermittent low back pain 12/28/2014  . Left sciatic nerve pain 12/28/2014    Past Surgical History:  Procedure Laterality Date  . APPENDECTOMY    . cervical polyp removal    . DILATION AND CURETTAGE OF UTERUS    . HYSTEROSCOPY    . HYSTEROSCOPY WITH D & C N/A 10/12/2015   Procedure: DILATATION AND CURETTAGE /HYSTEROSCOPY;  Surgeon: Will Bonnet, MD;  Location: [facility];  Service: Gynecology;  Laterality: N/A;  . LAPAROSCOPIC BILATERAL SALPINGO OOPHERECTOMY N/A 10/12/2015   Procedure: LAPAROSCOPIC RIGHT SALPINGECTOMY, LEFT OOPHORECTOMY;  Surgeon: Will Bonnet, MD;  Location: [facility];  Service: Gynecology;  Laterality: N/A;  . LAPAROSCOPIC OVARIAN CYSTECTOMY  Bilateral 10/12/2015   Procedure: LAPAROSCOPIC OVARIAN CYSTECTOMY;  Surgeon: Will Bonnet, MD;  Location: [facility];  Service: Gynecology;  Laterality: Bilateral;  . OOPHORECTOMY    . OVARIAN CYST REMOVAL      Family History  Problem Relation Age of Onset  . Hypertension Mother   . Diabetes Mother   . CAD  Mother   . Cancer Mother        cervical  . Heart attack Mother 57       Triple Bypass  . CAD Father   . Hypertension Father   . Arthritis Father   . Thyroid disease Sister   . Alzheimer's disease Maternal Grandmother   . Alzheimer's disease Maternal Grandfather   . Breast cancer Paternal Grandmother      Current Outpatient Medications:  .  Alpha Lipoic Acid 200 MG CAPS, Take by mouth., Disp: , Rfl:  .  aspirin EC 81 MG tablet, Take 1 tablet (81 mg total) by mouth daily., Disp: 30 tablet, Rfl: 0 .  Cetirizine HCl (ZYRTEC PO), Take 10 mg by mouth daily., Disp: , Rfl:  .  Cholecalciferol (VITAMIN D) 2000 UNITS tablet, Take 1 tablet by mouth daily., Disp: , Rfl:  .  fluticasone (FLONASE) 50 MCG/ACT nasal spray, Place 2 sprays into both nostrils daily., Disp: 16 g, Rfl: 5 .  ipratropium-albuterol (DUONEB) 0.5-2.5 (3) MG/3ML SOLN, Take 3 mLs by nebulization every 4 (four) hours as needed., Disp: 120 mL, Rfl: 0 .  meloxicam (MOBIC) 15 MG tablet, Take 1 tablet (15 mg total) by mouth daily., Disp: 90 tablet, Rfl: 0 .  albuterol (VENTOLIN HFA) 108 (90 Base) MCG/ACT inhaler, Inhale 2 puffs into the lungs every 6 (six) hours as needed for wheezing or shortness of breath., Disp: 18 g, Rfl: 0 .  atorvastatin (LIPITOR) 40 MG tablet, Take 1 tablet (40 mg total) by mouth daily., Disp: 30 tablet, Rfl: 3 .  budesonide-formoterol (SYMBICORT) 160-4.5 MCG/ACT inhaler, Inhale 2 puffs into the lungs 2 (two) times daily., Disp: 1 Inhaler, Rfl: 3 .  gabapentin (NEURONTIN) 300 MG capsule, Take 1 capsule (300 mg total) by mouth 3 (three) times daily. (Patient taking differently: Take 300 mg by mouth at bedtime. ), Disp: 90 capsule, Rfl: 2 .  montelukast (SINGULAIR) 10 MG tablet, Take 1 tablet (10 mg total) by mouth at bedtime., Disp: 30 tablet, Rfl: 3 .  nitrofurantoin, macrocrystal-monohydrate, (MACROBID) 100 MG capsule, Take 1 capsule (100 mg total) by mouth 2 (two) times daily., Disp: 10 capsule, Rfl: 0 .   olmesartan-hydrochlorothiazide (BENICAR HCT) 20-12.5 MG tablet, Take 1 tablet by mouth daily., Disp: 30 tablet, Rfl: 3 .  omeprazole (PRILOSEC) 20 MG capsule, Take 1 capsule (20 mg total) by mouth daily., Disp: 30 capsule, Rfl: 3 .  sitaGLIPtin (JANUVIA) 100 MG tablet, Take 1 tablet (100 mg total) by mouth daily., Disp: 30 tablet, Rfl: 3  Allergies  Allergen Reactions  . Fish Allergy Shortness Of Breath  . Penicillins Anaphylaxis and Rash    Childhood allergy  . Levofloxacin Other (See Comments)    Cramping  . Adhesive [Tape] Rash    Paper tape ok to use.    I personally reviewed active problem list, medication list, allergies, family history, social history, health maintenance with the patient/caregiver today.   ROS  Constitutional: Negative for fever or weight change.  Respiratory: positive for cough daily but not as much and very seldom has  shortness of breath.   Cardiovascular: Negative for chest pain or palpitations.  Gastrointestinal: Negative for abdominal pain, no bowel changes.  Musculoskeletal: Negative for gait problem or joint swelling.  Skin: Negative for rash.  Neurological: Negative for dizziness or headache.  No other specific complaints in a complete review of systems (except as listed in HPI above).   Objective  Vitals:   04/10/19 1048  BP: 130/80  Pulse: 92  Resp: 18  Temp: (!) 97.1 F (36.2 C)  TempSrc: Temporal  SpO2: 97%  Weight: 274 lb 11.2 oz (124.6 kg)  Height: 5\' 2"  (1.575 m)    Body mass index is 50.24 kg/m.  Physical Exam  Constitutional: Patient appears well-developed and well-nourished. Obese. No distress.  HEENT: head atraumatic, normocephalic, pupils equal and reactive to light Cardiovascular: Normal rate, regular rhythm and normal heart sounds.  No murmur heard. No BLE edema. Pulmonary/Chest: Effort normal and breath sounds normal. No respiratory distress. Abdominal: Soft.  There is no tenderness.Negative CVA tenderness   Psychiatric: Patient has a normal mood and affect. behavior is normal. Judgment and thought content normal.   Recent Results (from the past 2160 hour(s))  POCT urinalysis dipstick     Status: Normal   Collection Time: 04/10/19 10:55 AM  Result Value Ref Range   Color, UA yellow    Clarity, UA clear    Glucose, UA Negative Negative   Bilirubin, UA Negative    Ketones, UA Negative    Spec Grav, UA 1.015 1.010 - 1.025   Blood, UA Negative    pH, UA 5.0 5.0 - 8.0   Protein, UA Negative Negative   Urobilinogen, UA 0.2 0.2 or 1.0 E.U./dL   Nitrite, UA Negative    Leukocytes, UA Negative Negative   Appearance Normal    Odor Normal   POCT HgB A1C     Status: Abnormal   Collection Time: 04/10/19 10:56 AM  Result Value Ref Range   Hemoglobin A1C 6.9 (A) 4.0 - 5.6 %   HbA1c POC (<> result, manual entry)     HbA1c, POC (prediabetic range)     HbA1c, POC (controlled diabetic range)        PHQ2/9: Depression screen [facility] 2/9 04/10/2019 01/06/2019 09/10/2018 06/26/2018 04/25/2018  Decreased Interest 0 0 0 0 0  Down, Depressed, Hopeless 0 0 0 0 0  PHQ - 2 Score 0 0 0 0 0  Altered sleeping 0 1 0 0 -  Tired, decreased energy 0 3 0 0 -  Change in appetite 0 0 0 0 -  Feeling bad or failure about yourself  0 0 0 0 -  Trouble concentrating 0 0 0 0 -  Moving slowly or fidgety/restless 0 0 0 0 -  Suicidal thoughts 0 0 0 0 -  PHQ-9 Score 0 4 0 0 -  Difficult doing work/chores Not difficult at all Not difficult at all - Not difficult at all -  Some recent data might be hidden    phq 9 is negative   Fall Risk: Fall Risk  04/10/2019 01/06/2019 09/10/2018 06/26/2018 04/25/2018  Falls in the past year? 0 0 0 0 0  Number falls in past yr: 0 0 0 0 -  Injury with Fall? 0 0 0 0 -  Follow up - - - Falls evaluation completed -     Functional Status Survey: Is the patient deaf or have difficulty hearing?: No Does the patient have difficulty seeing, even when wearing glasses/contacts?: No Does the patient  have difficulty concentrating, remembering, or making decisions?: No Does the patient have  difficulty walking or climbing stairs?: No Does the patient have difficulty dressing or bathing?: No Does the patient have difficulty doing errands alone such as visiting a doctor's office or shopping?: No    Assessment & Plan   1. Type 2 diabetes, controlled, with peripheral neuropathy (HCC)  - POCT HgB A1C - sitaGLIPtin (JANUVIA) 100 MG tablet; Take 1 tablet (100 mg total) by mouth daily.  Dispense: 30 tablet; Refill: 3  2. Dysuria  - POCT urinalysis dipstick - CULTURE, URINE COMPREHENSIVE - nitrofurantoin, macrocrystal-monohydrate, (MACROBID) 100 MG capsule; Take 1 capsule (100 mg total) by mouth 2 (two) times daily.  Dispense: 10 capsule; Refill: 0  3. Benign essential HTN  - olmesartan-hydrochlorothiazide (BENICAR HCT) 20-12.5 MG tablet; Take 1 tablet by mouth daily.  Dispense: 30 tablet; Refill: 3  4. Asthma, moderate persistent, well-controlled  - montelukast (SINGULAIR) 10 MG tablet; Take 1 tablet (10 mg total) by mouth at bedtime.  Dispense: 30 tablet; Refill: 3  5. Gastro-esophageal reflux disease without esophagitis  - omeprazole (PRILOSEC) 20 MG capsule; Take 1 capsule (20 mg total) by mouth daily.  Dispense: 30 capsule; Refill: 3  6. Dyslipidemia associated with type 2 diabetes mellitus (HCC)  - atorvastatin (LIPITOR) 40 MG tablet; Take 1 tablet (40 mg total) by mouth daily.  Dispense: 30 tablet; Refill: 3  7. Asthma, moderate persistent, poorly-controlled  - albuterol (VENTOLIN HFA) 108 (90 Base) MCG/ACT inhaler; Inhale 2 puffs into the lungs every 6 (six) hours as needed for wheezing or shortness of breath.  Dispense: 18 g; Refill: 0 - budesonide-formoterol (SYMBICORT) 160-4.5 MCG/ACT inhaler; Inhale 2 puffs into the lungs 2 (two) times daily.  Dispense: 1 Inhaler; Refill: 3

## 2019-04-12 LAB — CULTURE, URINE COMPREHENSIVE
MICRO NUMBER:: 10124392
SPECIMEN QUALITY:: ADEQUATE

## 2019-04-21 ENCOUNTER — Ambulatory Visit
Admission: RE | Admit: 2019-04-21 | Discharge: 2019-04-21 | Disposition: A | Discharge status: Home / Self Care | Payer: 59 | Source: Ambulatory Visit | Attending: Family Medicine | Admitting: Family Medicine

## 2019-04-21 DIAGNOSIS — Z1231 Encounter for screening mammogram for malignant neoplasm of breast: Secondary | ICD-10-CM | POA: Insufficient documentation

## 2019-05-12 ENCOUNTER — Ambulatory Visit: Payer: 59 | Admitting: Internal Medicine

## 2019-05-12 ENCOUNTER — Other Ambulatory Visit: Payer: Self-pay

## 2019-05-12 ENCOUNTER — Ambulatory Visit
Admission: RE | Admit: 2019-05-12 | Discharge: 2019-05-12 | Disposition: A | Discharge status: Home / Self Care | Payer: 59 | Source: Ambulatory Visit | Attending: Internal Medicine | Admitting: Internal Medicine

## 2019-05-12 ENCOUNTER — Encounter: Payer: Self-pay | Admitting: Internal Medicine

## 2019-05-12 VITALS — BP 112/78 | HR 88 | Temp 97.3°F | Resp 20 | Ht 62.0 in | Wt 285.0 lb

## 2019-05-12 DIAGNOSIS — M79671 Pain in right foot: Secondary | ICD-10-CM

## 2019-05-12 DIAGNOSIS — E1142 Type 2 diabetes mellitus with diabetic polyneuropathy: Secondary | ICD-10-CM | POA: Diagnosis not present

## 2019-05-12 NOTE — Progress Notes (Signed)
Patient ID: Kelsey Alvarez, female    DOB: 08-Dec-1970, 49 y.o.   MRN: [id number]  PCP: Steele Sizer, MD  Chief Complaint  Patient presents with  . Foot Pain    Right foot throbbing, some swelling, onset week and a half    Subjective:   Kelsey Alvarez is a 49 y.o. female, presents to clinic with CC of the following:  Chief Complaint  Patient presents with  . Foot Pain    Right foot throbbing, some swelling, onset week and a half    HPI: Patient is a 49 year old female with a history of diabetes and peripheral neuropathy affecting the distal lower extremities and feet, also with morbid obesity who presents with right foot pain. 4 weeks ago, she stepped on a root, and felt pain after that persisted for a couple weeks, before getting better.  The symptoms started to bother her again a week and a half ago, with no recurring trauma she could identify.  She felt that the pain in the same area, and also the foot swells, especially at the end of the day or the next day if she has been on her feet a lot the day prior.  Describes the pain as a throbbing. Feels the pain on the top of the foot and the lateral aspect, mostly on the foot beneath the pinky toe.  She notes it is not as swollen presently as it can get as the day wears on. No open cuts or increased redness or increased warmth concerns. Denies any pain at the ankle or with ankle motions.  Denies any calf pains. Has tried ice and Aleve products-2 at a time prn to help, and also her husband has wrapped it some to help.  No h/o major foot injuries/fractures in the past, does have peripheral neuropathy in her feet felt to be from the diabetes.   Patient Active Problem List   Diagnosis Date Noted  . Type 2 diabetes, controlled, with peripheral neuropathy ([place]) 01/20/2018  . Endometriosis 11/06/2017  . Menorrhagia with irregular cycle 10/28/2017  . Fibroid uterus 10/28/2017  . Carpal tunnel syndrome on both sides 10/17/2017  .  B12 deficiency 04/27/2016  . History of endometriosis 11/17/2015  . Oligomenorrhea 10/12/2015  . Benign essential HTN 12/28/2014  . Cervical polyp 12/28/2014  . Controlled type 2 diabetes mellitus with microalbuminuria ([place]) 12/28/2014  . Dyslipidemia 12/28/2014  . Dysmenorrhea 12/28/2014  . Edema 12/28/2014  . Female infertility 12/28/2014  . Gastro-esophageal reflux disease without esophagitis 12/28/2014  . Morbid obesity ([place]) 12/28/2014  . Asthma, moderate persistent, poorly-controlled 12/28/2014  . Perennial allergic rhinitis 12/28/2014  . Intermittent tremor 12/28/2014  . Vitamin D deficiency 12/28/2014  . Intermittent low back pain 12/28/2014  . Left sciatic nerve pain 12/28/2014      Current Outpatient Medications:  .  albuterol (VENTOLIN HFA) 108 (90 Base) MCG/ACT inhaler, Inhale 2 puffs into the lungs every 6 (six) hours as needed for wheezing or shortness of breath., Disp: 18 g, Rfl: 0 .  Alpha Lipoic Acid 200 MG CAPS, Take by mouth., Disp: , Rfl:  .  aspirin EC 81 MG tablet, Take 1 tablet (81 mg total) by mouth daily., Disp: 30 tablet, Rfl: 0 .  atorvastatin (LIPITOR) 40 MG tablet, Take 1 tablet (40 mg total) by mouth daily., Disp: 30 tablet, Rfl: 3 .  budesonide-formoterol (SYMBICORT) 160-4.5 MCG/ACT inhaler, Inhale 2 puffs into the lungs 2 (two) times daily., Disp: 1 Inhaler, Rfl: 3 .  Cetirizine HCl (  ZYRTEC PO), Take 10 mg by mouth daily., Disp: , Rfl:  .  Cholecalciferol (VITAMIN D) 2000 UNITS tablet, Take 1 tablet by mouth daily., Disp: , Rfl:  .  fluticasone (FLONASE) 50 MCG/ACT nasal spray, Place 2 sprays into both nostrils daily., Disp: 16 g, Rfl: 5 .  gabapentin (NEURONTIN) 300 MG capsule, Take 1 capsule (300 mg total) by mouth 3 (three) times daily. (Patient taking differently: Take 300 mg by mouth at bedtime. ), Disp: 90 capsule, Rfl: 2 .  ipratropium-albuterol (DUONEB) 0.5-2.5 (3) MG/3ML SOLN, Take 3 mLs by nebulization every 4 (four) hours as needed., Disp:  120 mL, Rfl: 0 .  meloxicam (MOBIC) 15 MG tablet, Take 1 tablet (15 mg total) by mouth daily., Disp: 90 tablet, Rfl: 0 .  montelukast (SINGULAIR) 10 MG tablet, Take 1 tablet (10 mg total) by mouth at bedtime., Disp: 30 tablet, Rfl: 3 .  olmesartan-hydrochlorothiazide (BENICAR HCT) 20-12.5 MG tablet, Take 1 tablet by mouth daily., Disp: 30 tablet, Rfl: 3 .  omeprazole (PRILOSEC) 20 MG capsule, Take 1 capsule (20 mg total) by mouth daily., Disp: 30 capsule, Rfl: 3 .  sitaGLIPtin (JANUVIA) 100 MG tablet, Take 1 tablet (100 mg total) by mouth daily., Disp: 30 tablet, Rfl: 3   Allergies  Allergen Reactions  . Fish Allergy Shortness Of Breath  . Penicillins Anaphylaxis and Rash    Childhood allergy  . Levofloxacin Other (See Comments)    Cramping  . Adhesive [Tape] Rash    Paper tape ok to use.     Past Surgical History:  Procedure Laterality Date  . APPENDECTOMY    . cervical polyp removal    . DILATION AND CURETTAGE OF UTERUS    . HYSTEROSCOPY    . HYSTEROSCOPY WITH D & C N/A 10/12/2015   Procedure: DILATATION AND CURETTAGE /HYSTEROSCOPY;  Surgeon: Will Bonnet, MD;  Location: [facility];  Service: Gynecology;  Laterality: N/A;  . LAPAROSCOPIC BILATERAL SALPINGO OOPHERECTOMY N/A 10/12/2015   Procedure: LAPAROSCOPIC RIGHT SALPINGECTOMY, LEFT OOPHORECTOMY;  Surgeon: Will Bonnet, MD;  Location: [facility];  Service: Gynecology;  Laterality: N/A;  . LAPAROSCOPIC OVARIAN CYSTECTOMY Bilateral 10/12/2015   Procedure: LAPAROSCOPIC OVARIAN CYSTECTOMY;  Surgeon: Will Bonnet, MD;  Location: [facility];  Service: Gynecology;  Laterality: Bilateral;  . OOPHORECTOMY    . OVARIAN CYST REMOVAL       Family History  Problem Relation Age of Onset  . Hypertension Mother   . Diabetes Mother   . CAD Mother   . Cancer Mother        cervical  . Heart attack Mother 57       Triple Bypass  . CAD Father   . Hypertension Father   . Arthritis Father   . Thyroid disease Sister   . Alzheimer's  disease Maternal Grandmother   . Alzheimer's disease Maternal Grandfather   . Breast cancer Paternal Grandmother      Social History   Tobacco Use  . Smoking status: Former Smoker    Years: 1.00    Types: Cigarettes    Start date: 12/03/2001    Quit date: 12/28/2002    Years since quitting: 16.3  . Smokeless tobacco: Never Used  . Tobacco comment: smoked 3 to 4 cigarrettes a day for a year  Substance Use Topics  . Alcohol use: Yes    Alcohol/week: 0.0 standard drinks    Comment: occasionally drinks margarita, once or twice a year    With staff assistance, above reviewed  with the patient today.  ROS: As per HPI, otherwise no specific complaints on a limited and focused system review   No results found for this or any previous visit (from the past 72 hour(s)).   PHQ2/9: Depression screen [facility] 2/9 05/12/2019 04/10/2019 01/06/2019 09/10/2018 06/26/2018  Decreased Interest 0 0 0 0 0  Down, Depressed, Hopeless 0 0 0 0 0  PHQ - 2 Score 0 0 0 0 0  Altered sleeping 0 0 1 0 0  Tired, decreased energy 0 0 3 0 0  Change in appetite 0 0 0 0 0  Feeling bad or failure about yourself  0 0 0 0 0  Trouble concentrating 0 0 0 0 0  Moving slowly or fidgety/restless 0 0 0 0 0  Suicidal thoughts 0 0 0 0 0  PHQ-9 Score 0 0 4 0 0  Difficult doing work/chores Not difficult at all Not difficult at all Not difficult at all - Not difficult at all  Some recent data might be hidden   PHQ-2/9 Result is neg  Fall Risk: Fall Risk  05/12/2019 04/10/2019 01/06/2019 09/10/2018 06/26/2018  Falls in the past year? 0 0 0 0 0  Number falls in past yr: 0 0 0 0 0  Injury with Fall? 0 0 0 0 0  Follow up - - - - Falls evaluation completed      Objective:   Vitals:   05/12/19 0950  BP: 112/78  Pulse: 88  Resp: 20  Temp: (!) 97.3 F (36.3 C)  TempSrc: Temporal  SpO2: 94%  Weight: 285 lb (129.3 kg)  Height: 5\' 2"  (1.575 m)    Body mass index is 52.13 kg/m.  Physical Exam   NAD, masked, obese, pleasant  HEENT - [place]/AT, sclera anicteric,  Ext - 1+  LE edema bilat, no pain with palpating the calf muscle on the right, no increased erythema or increased warmth. Right ankle -good range of motion with the ankle, no pain with range of motion testing, nor with plantar flexion versus resistance, no pain palpating the lateral and medial malleoli.  Some soreness more than marked pain inferior to the lateral malleolus and extending towards the proximal fifth metatarsal region.  No focal tenderness at the ATF, nontender in the syndesmotic region Right Foot - tender with palpation greatest along the fifth metatarsal, slightly more on the distal aspect than the proximal aspect.  Less tender over the fourth metatarsal region and nontender over the 1 through 3 metatarsals and the bones more proximal to the first through third metatarsals No nodularities, no bruising, minimal swelling , and similar to that of the opposite foot. NT plantar aspect of the foot, with no skin disruptions noted Good toe motion with some tightness more than pain noted with toe motion.   Sensation grossly intact to LT over foot, DP pulse palpable Could bear weight with standing, with mild discomfort of right foot on the lateral aspect with weight bearing noted. Neuro/psychiatric - affect was not flat, appropriate with conversation  Alert   Speech normal   Results for orders placed or performed in visit on 04/10/19  CULTURE, URINE COMPREHENSIVE   Specimen: Urine  Result Value Ref Range   MICRO NUMBER: [id number]    SPECIMEN QUALITY: Adequate    Source OTHER (SPECIFY)    STATUS: FINAL    RESULT:      1,000-9,000 CFU/ML of Non-uropathogenic Gram positive organism May represent colonizers from external and internal genitalia. No further testing (including susceptibility)  will be performed.  POCT HgB A1C  Result Value Ref Range   Hemoglobin A1C 6.9 (A) 4.0 - 5.6 %   HbA1c POC (<> result, manual entry)     HbA1c, POC (prediabetic range)      HbA1c, POC (controlled diabetic range)    POCT urinalysis dipstick  Result Value Ref Range   Color, UA yellow    Clarity, UA clear    Glucose, UA Negative Negative   Bilirubin, UA Negative    Ketones, UA Negative    Spec Grav, UA 1.015 1.010 - 1.025   Blood, UA Negative    pH, UA 5.0 5.0 - 8.0   Protein, UA Negative Negative   Urobilinogen, UA 0.2 0.2 or 1.0 E.U./dL   Nitrite, UA Negative    Leukocytes, UA Negative Negative   Appearance Normal    Odor Normal        Assessment & Plan:    1. Acute foot pain, right Educated, do feel getting an x-ray is indicated at this point and ordered.  Concern for an injury to the fifth metatarsal, and discussed how can have stress injuries/fractures to this area as well. If the x-ray is negative for any fracture concern, likely a sprain, with a possible element of a peroneal tendinitis, or midfoot sprain.   To use the RICE modalities recommended, and reviewed them with her.  Can use an Ace wrap for compression, relative rest and elevation important, and cold application not heat to help.  Also can continue an Aleve product up to 2 tablets twice daily with food as needed in the short-term. If not improving or more problematic over time should follow-up again. If the x-ray is positive for concern, she will be notified with further discussions at that point.  2. Morbid obesity ([place]) Do feel is likely a contributor, with increased forces to the foot area.  3. Type 2 diabetes, controlled, with peripheral neuropathy ([place]) With her peripheral neuropathy history, do feel an x-ray is important to get today. Await that result presently       Towanda Malkin, MD 05/12/19 10:09 AM

## 2019-05-12 NOTE — Progress Notes (Signed)
Kelsey Alvarez, The x-ray result of the foot was read as enthesopathic changes at the base of the fifth metatarsal (which refers to some chronic changes with the attachment of the tendons and ligaments onto the bone which can cause stiffness and pain at that affected area).  There was also some degenerative changes noted in the midfoot. There was no evidence of fractures or dislocations. This is often managed with rest, cold application, and anti-inflammatory medications as was recommended, and hopefully will improve the symptoms. The good news is there were no fracture concerns. Dr. Roxan Hockey

## 2019-05-12 NOTE — Patient Instructions (Signed)
Please obtain x-ray today.  Recommend the RICE modalities presently, with relative rest, ice topically at 10 minute intervals and careful not to cause frostbite emphasized with the neuropathy, compression with an Ace wrap, and elevation as we discussed.  Can use an Aleve product as you have been doing, can take 2 tablets up to twice daily about 12 hours apart as needed, and take with food recommended.

## 2019-05-15 IMAGING — CR DG CERVICAL SPINE 2 OR 3 VIEWS
1 series · 3 of 3 positions shown · non-contrast
Comparison: None.

CLINICAL DATA: Right shoulder pain.

EXAM:
CERVICAL SPINE - 2-3 VIEW

[Series 1: dg cervical spine 2 or 3 views · 0.14mm/px · 3 of 3 slices shown]
[im 1/3]
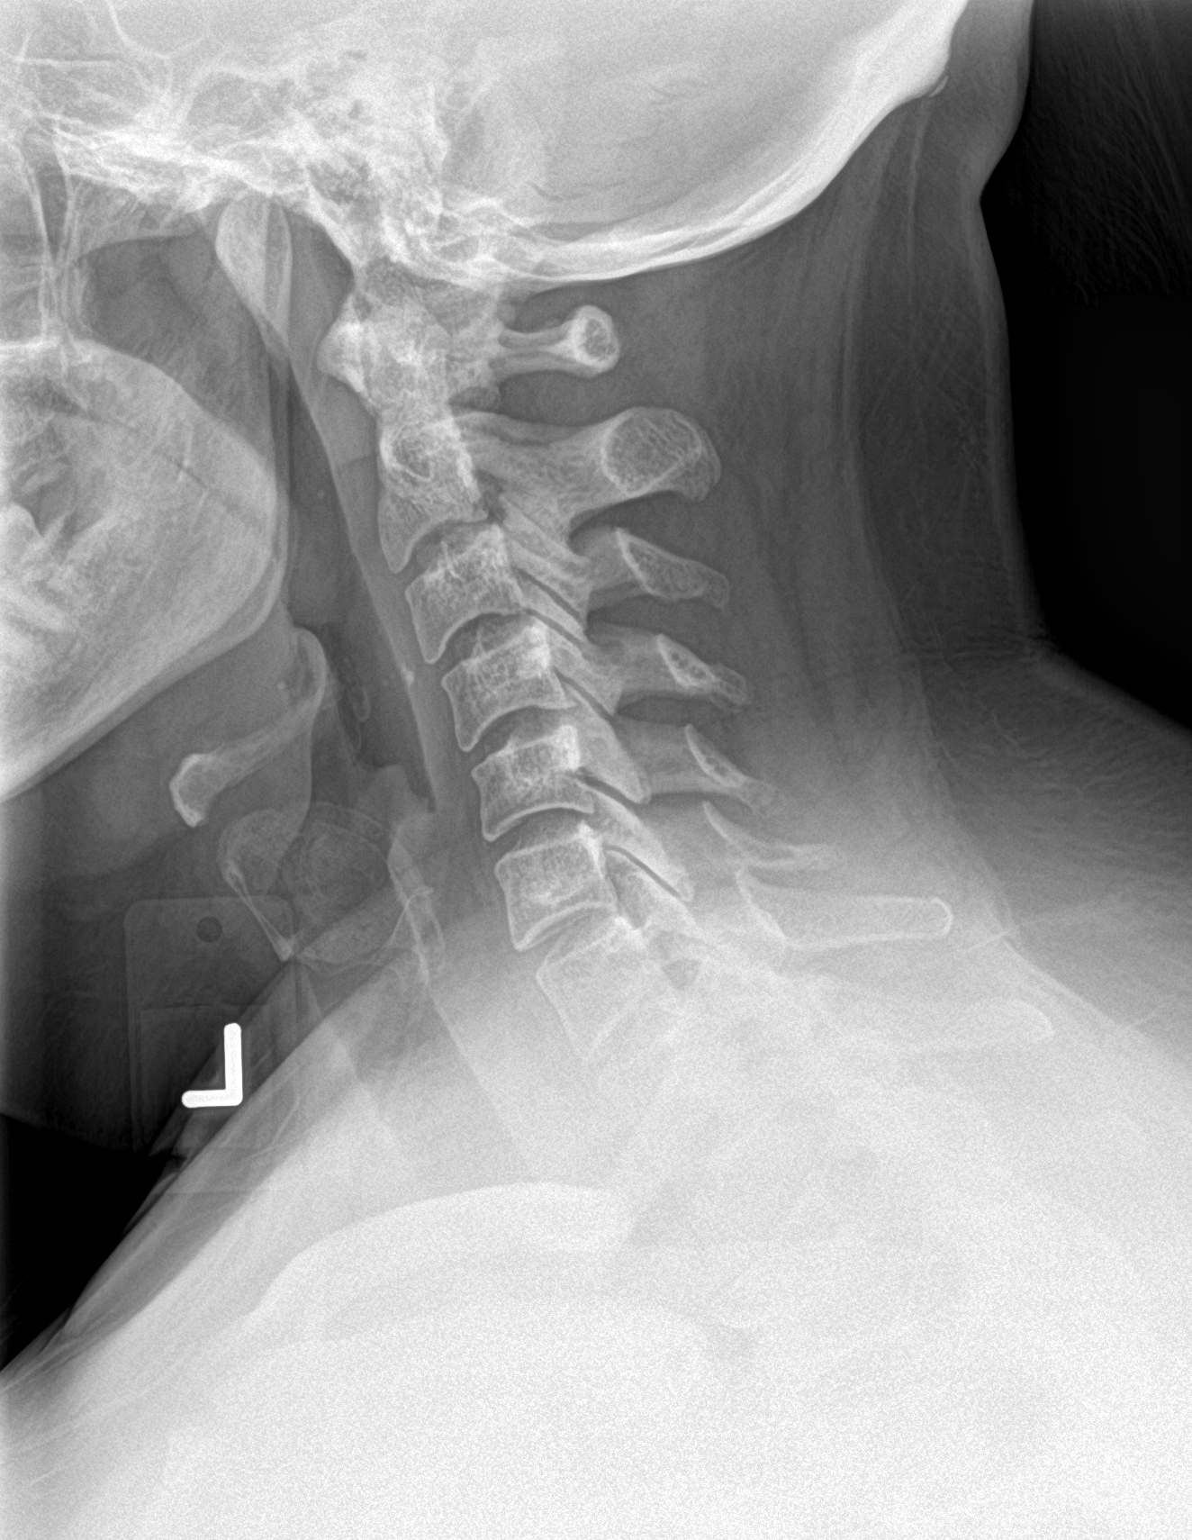
[im 2/3]
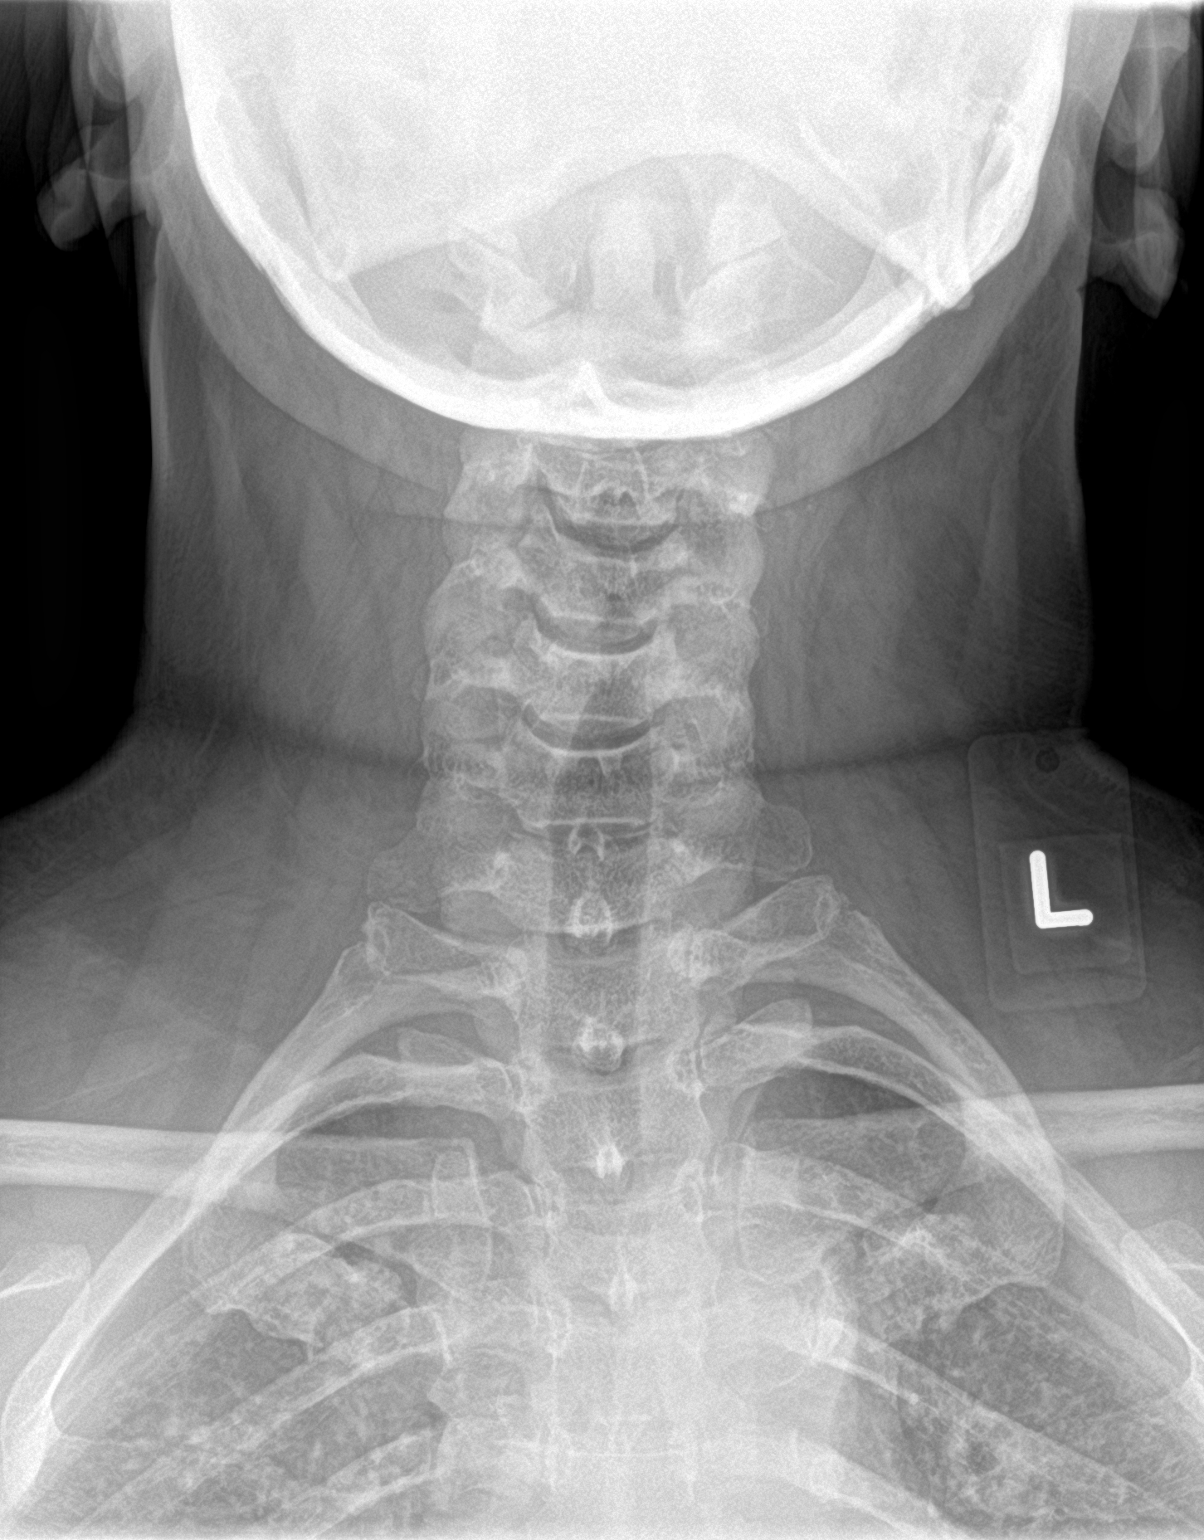
[im 3/3]
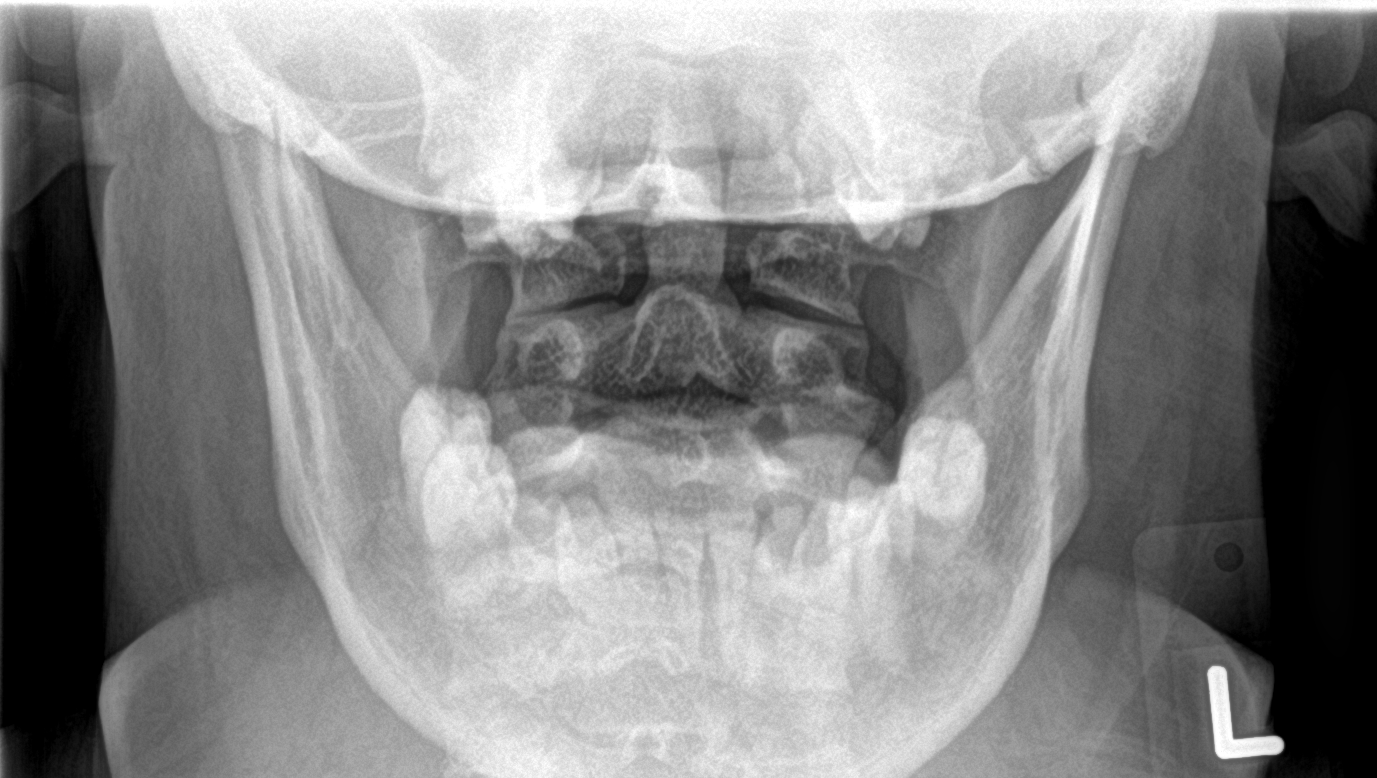

[3 of 3 positions shown; findings below may reference images not displayed]

FINDINGS: The lateral view is diagnostic to the T1 level. There is no acute
fracture or subluxation. Vertebral body heights are preserved.
Reversal of the normal cervical lordosis. Sagittal alignment is
normal. Interveterbral disc spaces are maintained. Normal
prevertebral soft tissues.
IMPRESSION: No acute osseous abnormality or significant degenerative changes.

## 2019-05-15 IMAGING — CR DG SHOULDER 2+V*R*
1 series · 3 of 3 positions shown · non-contrast
Comparison: None.

CLINICAL DATA: Right shoulder pain

EXAM:
RIGHT SHOULDER - 2+ VIEW

[Series 1: dg shoulder right · 0.14mm/px · 3 of 3 slices shown]
[im 1/3]
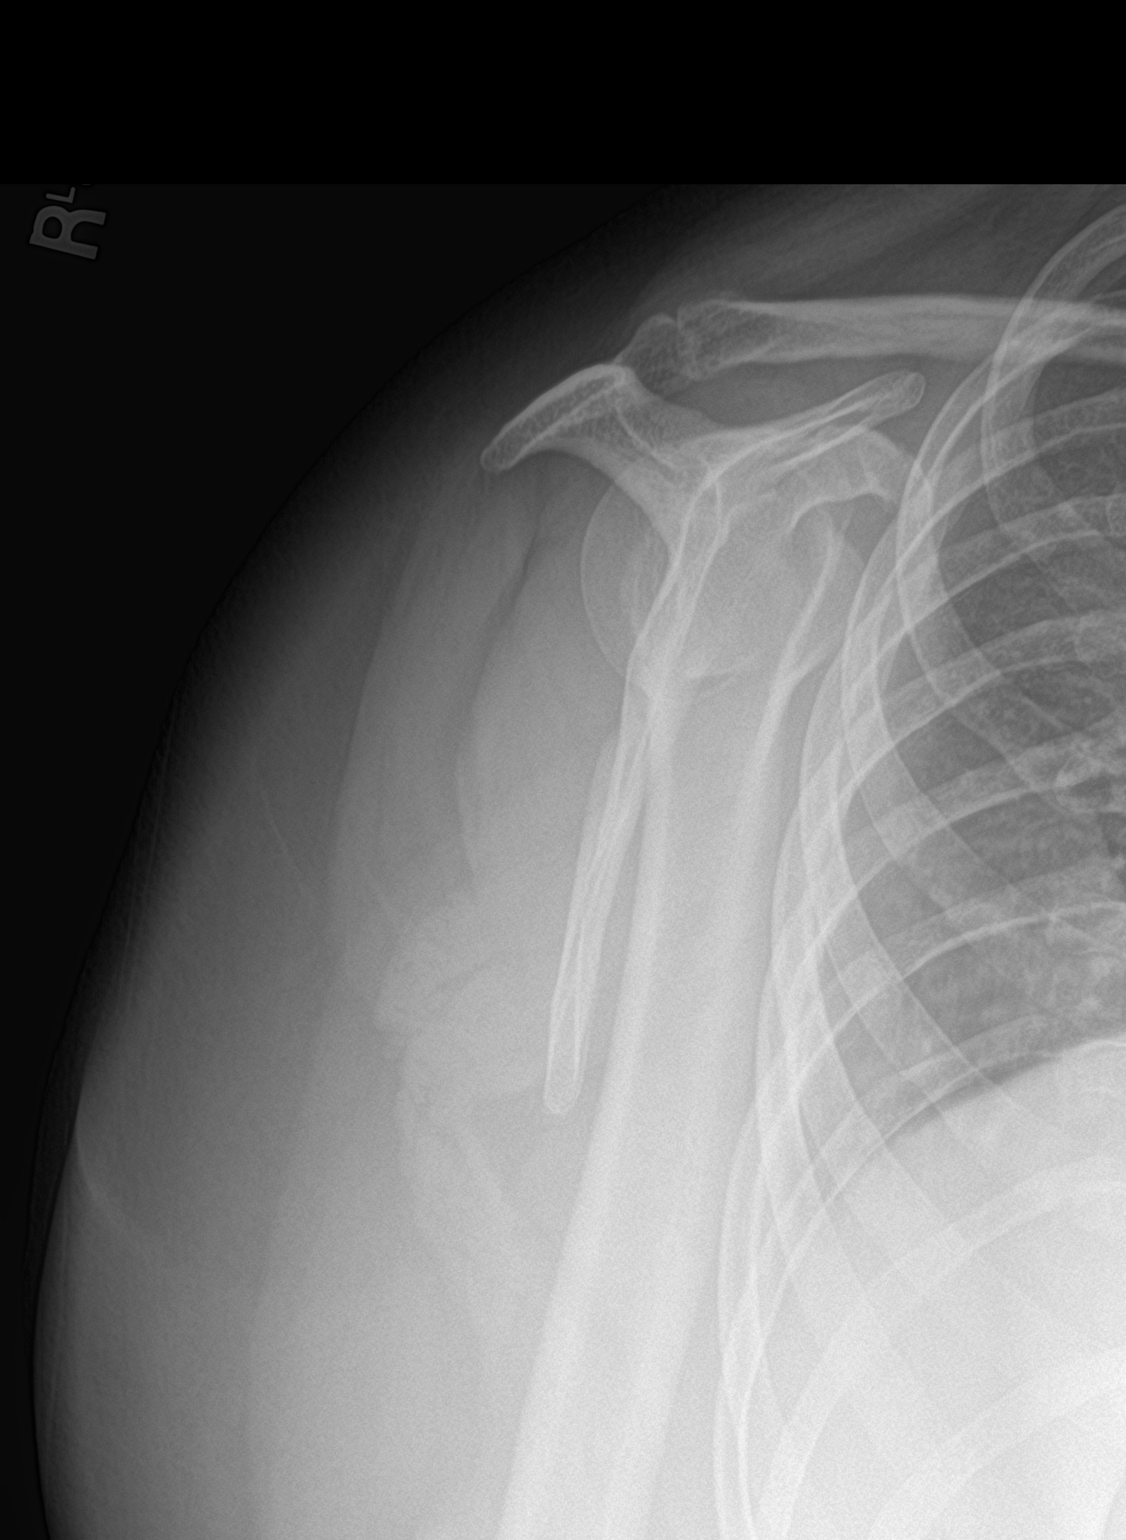
[im 2/3]
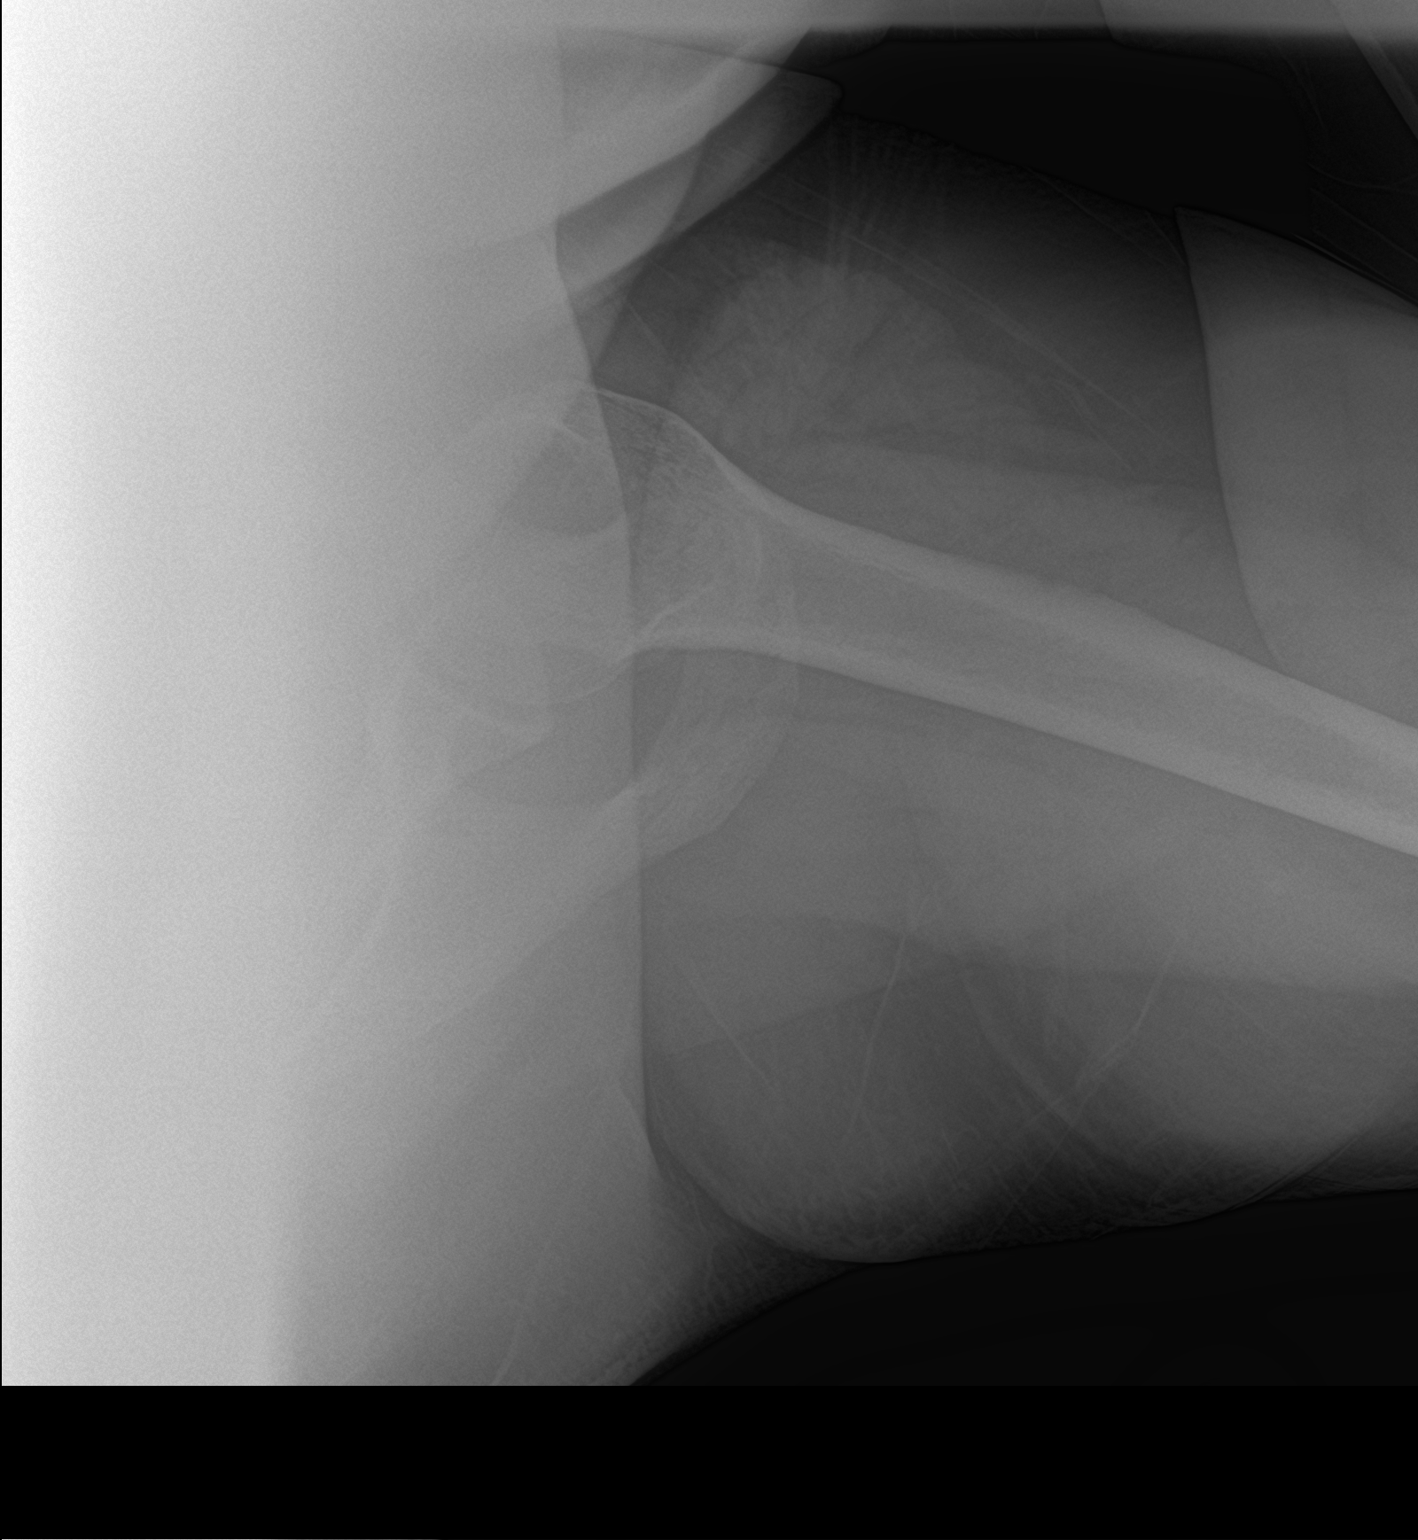
[im 3/3]
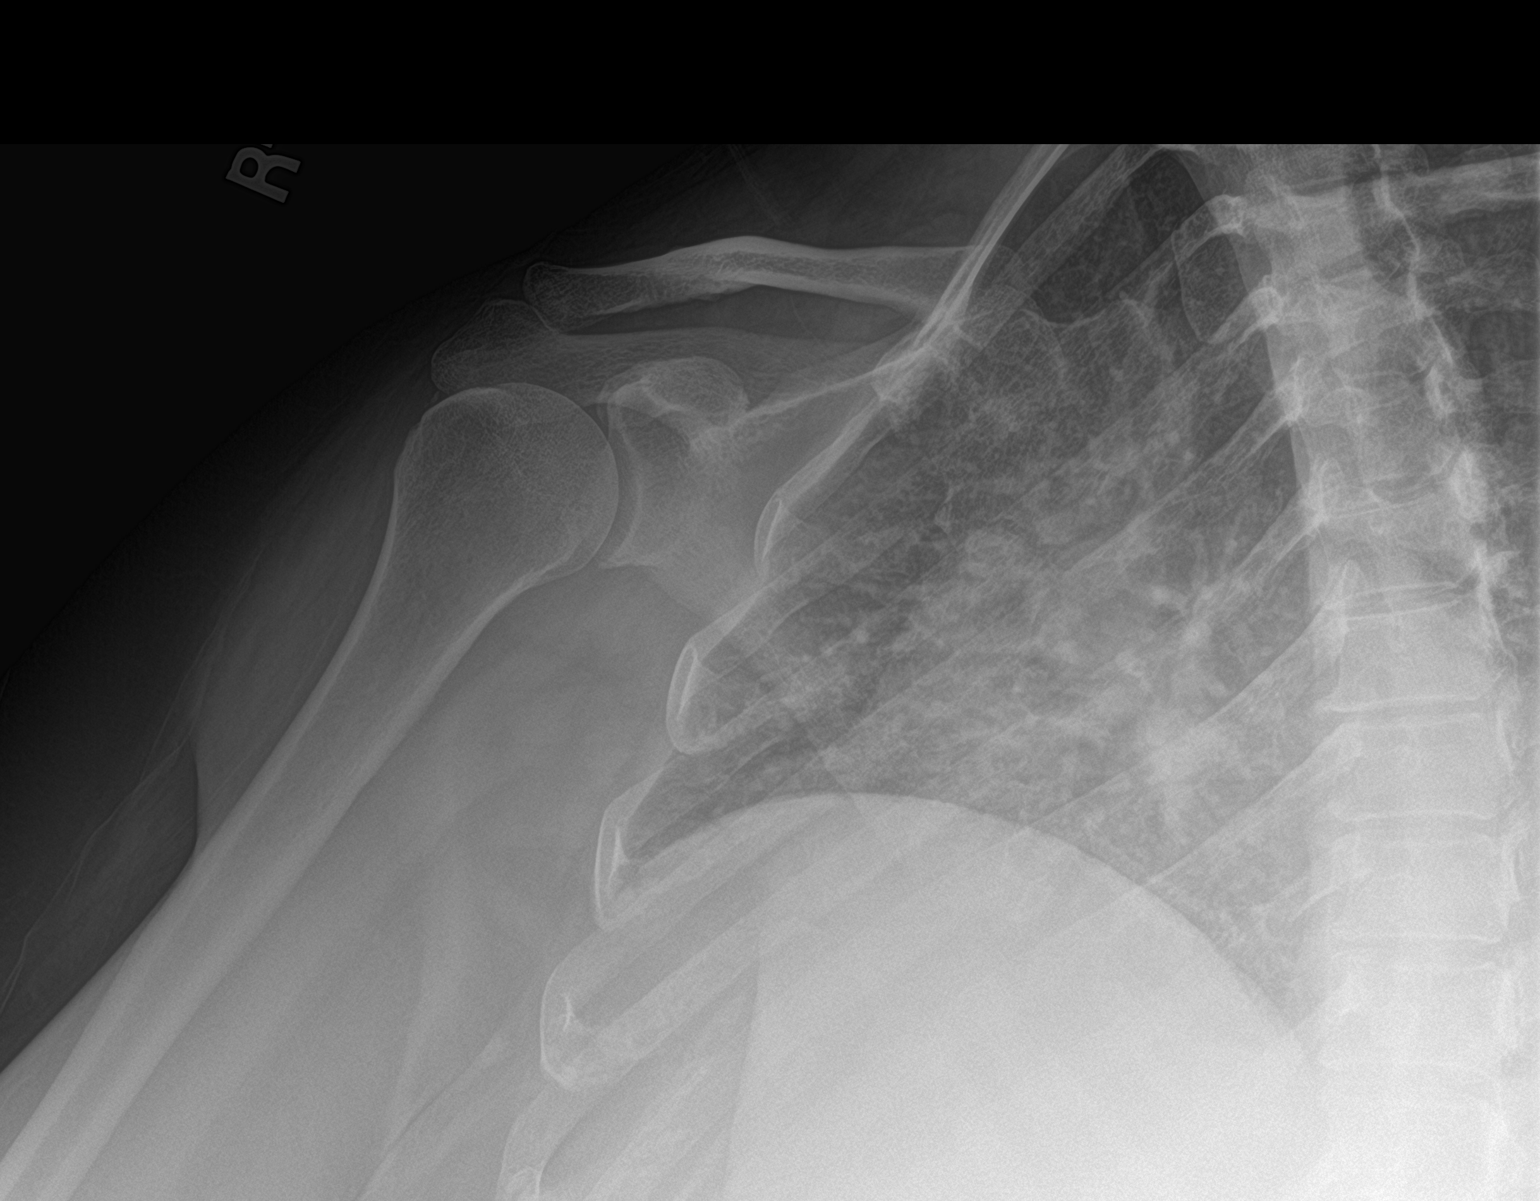

[3 of 3 positions shown; findings below may reference images not displayed]

FINDINGS: No acute bony abnormality. Specifically, no fracture, subluxation,
or dislocation. Joint space maintained.
IMPRESSION: Negative.

## 2019-06-08 ENCOUNTER — Ambulatory Visit: Payer: 59 | Attending: Internal Medicine

## 2019-06-08 DIAGNOSIS — Z23 Encounter for immunization: Secondary | ICD-10-CM

## 2019-06-08 NOTE — Progress Notes (Signed)
   Covid-19 Vaccination Clinic  Name:  Kelsey Alvarez    MRN: [id number] DOB: Apr 19, 1970  06/08/2019  [name] was observed post Covid-19 immunization for 15 minutes without incident. She was provided with Vaccine Information Sheet and instruction to access the V-Safe system.   [name] was instructed to call 911 with any severe reactions post vaccine: Marland Kitchen Difficulty breathing  . Swelling of face and throat  . A fast heartbeat  . A bad rash all over body  . Dizziness and weakness   Immunizations Administered    Name Date Dose VIS Date Route   Pfizer COVID-19 Vaccine 06/08/2019  2:27 PM 0.3 mL 02/13/2019 Intramuscular   Manufacturer: [place]   Lot: [id number]   [place]: [id number]

## 2019-07-01 ENCOUNTER — Other Ambulatory Visit: Payer: Self-pay

## 2019-07-01 ENCOUNTER — Ambulatory Visit: Payer: 59 | Attending: Internal Medicine

## 2019-07-01 DIAGNOSIS — Z23 Encounter for immunization: Secondary | ICD-10-CM

## 2019-07-01 NOTE — Progress Notes (Signed)
   Covid-19 Vaccination Clinic  Name:  Kelsey Alvarez    MRN: [id number] DOB: October 03, 1970  07/01/2019  Ms. Kelsey Alvarez was observed post Covid-19 immunization for 15 minutes without incident. She was provided with Vaccine Information Sheet and instruction to access the V-Safe system.   Ms. Kelsey Alvarez was instructed to call 911 with any severe reactions post vaccine: Marland Kitchen Difficulty breathing  . Swelling of face and throat  . A fast heartbeat  . A bad rash all over body  . Dizziness and weakness   Immunizations Administered    Name Date Dose VIS Date Route   Pfizer COVID-19 Vaccine 07/01/2019  3:31 PM 0.3 mL 04/29/2018 Intramuscular   Manufacturer: [place]   Lot: [id number]   [place]: [id number]

## 2019-07-10 ENCOUNTER — Ambulatory Visit: Payer: 59 | Admitting: Family Medicine

## 2019-08-27 ENCOUNTER — Other Ambulatory Visit: Payer: Self-pay | Admitting: Family Medicine

## 2019-08-27 DIAGNOSIS — E1142 Type 2 diabetes mellitus with diabetic polyneuropathy: Secondary | ICD-10-CM

## 2019-11-05 ENCOUNTER — Other Ambulatory Visit: Payer: Self-pay | Admitting: Family Medicine

## 2019-11-05 DIAGNOSIS — J4540 Moderate persistent asthma, uncomplicated: Secondary | ICD-10-CM

## 2019-12-05 ENCOUNTER — Other Ambulatory Visit: Payer: Self-pay | Admitting: Family Medicine

## 2019-12-05 DIAGNOSIS — K219 Gastro-esophageal reflux disease without esophagitis: Secondary | ICD-10-CM

## 2019-12-05 DIAGNOSIS — I10 Essential (primary) hypertension: Secondary | ICD-10-CM

## 2019-12-05 NOTE — Telephone Encounter (Signed)
Requested Prescriptions  Pending Prescriptions Disp Refills  . omeprazole (PRILOSEC) 20 MG capsule [Pharmacy Med Name: Omeprazole 20 MG Oral Capsule Delayed Release] 90 capsule 1    Sig: Take 1 capsule by mouth once daily     Gastroenterology: Proton Pump Inhibitors Passed - 12/05/2019 10:59 AM      Passed - Valid encounter within last 12 months    Recent Outpatient Visits          6 months ago Acute foot pain, right   [facility] Lebron Conners D, MD   7 months ago Type 2 diabetes, controlled, with peripheral neuropathy [facility])   [facility] Steele Sizer, MD   11 months ago Type 2 diabetes, controlled, with peripheral neuropathy [facility])   [facility] Steele Sizer, MD   1 year ago Controlled type 2 diabetes mellitus with microalbuminuria, without long-term current use of insulin [facility])   [facility] Steele Sizer, MD   1 year ago [facility], Drue Stager, MD             . olmesartan-hydrochlorothiazide (BENICAR HCT) 20-12.5 MG tablet [Pharmacy Med Name: Olmesartan Medoxomil-HCTZ 20-12.5 MG Oral Tablet] 30 tablet 0    Sig: Take 1 tablet by mouth once daily     Cardiovascular: ARB + Diuretic Combos Failed - 12/05/2019 10:59 AM      Failed - K in normal range and within 180 days    Potassium  Date Value Ref Range Status  01/06/2019 3.7 3.5 - 5.3 mmol/L Final  12/01/2013 3.7 3.5 - 5.1 mmol/L Final         Failed - Na in normal range and within 180 days    Sodium  Date Value Ref Range Status  01/06/2019 142 135 - 146 mmol/L Final  12/01/2013 137 136 - 145 mmol/L Final         Failed - Cr in normal range and within 180 days    Creat  Date Value Ref Range Status  01/06/2019 0.78 0.50 - 1.10 mg/dL Final         Failed - Ca in normal range and within 180 days    Calcium  Date Value Ref Range Status  01/06/2019 9.4 8.6 - 10.2 mg/dL Final   Calcium,  Total  Date Value Ref Range Status  12/01/2013 8.7 8.5 - 10.1 mg/dL Final         Failed - Valid encounter within last 6 months    Recent Outpatient Visits          6 months ago Acute foot pain, right   [facility] Lebron Conners D, MD   7 months ago Type 2 diabetes, controlled, with peripheral neuropathy [facility])   [facility] Steele Sizer, MD   11 months ago Type 2 diabetes, controlled, with peripheral neuropathy [facility])   [facility] Steele Sizer, MD   1 year ago Controlled type 2 diabetes mellitus with microalbuminuria, without long-term current use of insulin [facility])   [facility] Steele Sizer, MD   1 year ago [facility] Steele Sizer, MD             Passed - Patient is not pregnant      Passed - Last BP in normal range    BP Readings from Last 1 Encounters:  05/12/19 112/78

## 2019-12-09 ENCOUNTER — Other Ambulatory Visit: Payer: Self-pay | Admitting: Family Medicine

## 2019-12-09 DIAGNOSIS — K219 Gastro-esophageal reflux disease without esophagitis: Secondary | ICD-10-CM

## 2020-01-17 ENCOUNTER — Other Ambulatory Visit: Payer: Self-pay | Admitting: Family Medicine

## 2020-01-17 DIAGNOSIS — J4540 Moderate persistent asthma, uncomplicated: Secondary | ICD-10-CM

## 2020-01-17 DIAGNOSIS — I10 Essential (primary) hypertension: Secondary | ICD-10-CM

## 2020-01-17 NOTE — Telephone Encounter (Signed)
Requested Prescriptions  Pending Prescriptions Disp Refills  . olmesartan-hydrochlorothiazide (BENICAR HCT) 20-12.5 MG tablet [Pharmacy Med Name: Olmesartan Medoxomil-HCTZ 20-12.5 MG Oral Tablet] 6 tablet 0    Sig: TAKE 1 TABLET BY MOUTH ONCE DAILY.  PLEASE MAKE APPOINTMENT FOR FURTHER REFILLS     Cardiovascular: ARB + Diuretic Combos Failed - 01/17/2020  8:58 AM      Failed - K in normal range and within 180 days    Potassium  Date Value Ref Range Status  01/06/2019 3.7 3.5 - 5.3 mmol/L Final  12/01/2013 3.7 3.5 - 5.1 mmol/L Final         Failed - Na in normal range and within 180 days    Sodium  Date Value Ref Range Status  01/06/2019 142 135 - 146 mmol/L Final  12/01/2013 137 136 - 145 mmol/L Final         Failed - Cr in normal range and within 180 days    Creat  Date Value Ref Range Status  01/06/2019 0.78 0.50 - 1.10 mg/dL Final         Failed - Ca in normal range and within 180 days    Calcium  Date Value Ref Range Status  01/06/2019 9.4 8.6 - 10.2 mg/dL Final   Calcium, Total  Date Value Ref Range Status  12/01/2013 8.7 8.5 - 10.1 mg/dL Final         Failed - Valid encounter within last 6 months    Recent Outpatient Visits          8 months ago Acute foot pain, right   [facility] Le[facility] Conners [facility], MD   9 months ago Type 2 diabetes, controlled, with peripheral neuropathy [facility])   [facility] Steele Sizer, MD   1 year ago Type 2 diabetes, controlled, with peripheral neuropathy [facility])   [facility] Steele Sizer, MD   1 year ago Controlled type 2 diabetes mellitus with microalbuminuria, without long-term current use of insulin [facility])   [facility] Steele Sizer, MD   1 year ago [facility] Steele Sizer, MD      Future Appointments            In 5 days Steele Sizer, MD [facility], [place] -  Patient is not pregnant      Passed - Last BP in normal range    BP Readings from Last 1 Encounters:  05/12/19 112/78         . montelukast (SINGULAIR) 10 MG tablet [Pharmacy Med Name: Montelukast Sodium 10 MG Oral Tablet] 30 tablet 0    Sig: TAKE 1 TABLET BY MOUTH AT BEDTIME     Pulmonology:  Leukotriene Inhibitors Passed - 01/17/2020  8:58 AM      Passed - Valid encounter within last 12 months    Recent Outpatient Visits          8 months ago Acute foot pain, right   [facility] Le[facility] Conners D, MD   9 months ago Type 2 diabetes, controlled, with peripheral neuropathy [facility])   [facility], Drue Stager, MD   1 year ago Type 2 diabetes, controlled, with peripheral neuropathy [facility])   [facility] Steele Sizer, MD   1 year ago Controlled type 2 diabetes mellitus with microalbuminuria, without long-term current use of insulin ([place])  [facility] Steele Sizer, MD   1 year ago [facility] Steele Sizer, MD      Future Appointments            In 5 days Steele Sizer, MD [facility], [facility]

## 2020-01-20 ENCOUNTER — Ambulatory Visit: Payer: 59 | Admitting: Family Medicine

## 2020-01-22 ENCOUNTER — Encounter: Payer: Self-pay | Admitting: Family Medicine

## 2020-01-22 ENCOUNTER — Telehealth (INDEPENDENT_AMBULATORY_CARE_PROVIDER_SITE_OTHER): Payer: 59 | Admitting: Family Medicine

## 2020-01-22 VITALS — Ht 62.0 in | Wt 285.0 lb

## 2020-01-22 DIAGNOSIS — I10 Essential (primary) hypertension: Secondary | ICD-10-CM

## 2020-01-22 MED ORDER — OLMESARTAN MEDOXOMIL-HCTZ 20-12.5 MG PO TABS: 1.0000 | ORAL_TABLET | Freq: Every day | ORAL | 0 refills | Status: DC

## 2020-01-22 NOTE — Progress Notes (Signed)
We changed to in person visit, last seen Feb 2021, has DM and hypertension

## 2020-02-03 ENCOUNTER — Ambulatory Visit: Payer: 59 | Admitting: Family Medicine

## 2020-02-03 ENCOUNTER — Other Ambulatory Visit: Payer: Self-pay

## 2020-02-03 ENCOUNTER — Encounter: Payer: Self-pay | Admitting: Family Medicine

## 2020-02-03 VITALS — BP 136/74 | HR 89 | Temp 98.5°F | Resp 16 | Ht 62.0 in | Wt 284.4 lb

## 2020-02-03 DIAGNOSIS — E1142 Type 2 diabetes mellitus with diabetic polyneuropathy: Secondary | ICD-10-CM

## 2020-02-03 DIAGNOSIS — J3089 Other allergic rhinitis: Secondary | ICD-10-CM

## 2020-02-03 DIAGNOSIS — E538 Deficiency of other specified B group vitamins: Secondary | ICD-10-CM

## 2020-02-03 DIAGNOSIS — I10 Essential (primary) hypertension: Secondary | ICD-10-CM | POA: Diagnosis not present

## 2020-02-03 DIAGNOSIS — R5383 Other fatigue: Secondary | ICD-10-CM

## 2020-02-03 DIAGNOSIS — J4540 Moderate persistent asthma, uncomplicated: Secondary | ICD-10-CM | POA: Diagnosis not present

## 2020-02-03 DIAGNOSIS — B373 Candidiasis of vulva and vagina: Secondary | ICD-10-CM

## 2020-02-03 DIAGNOSIS — M7581 Other shoulder lesions, right shoulder: Secondary | ICD-10-CM

## 2020-02-03 DIAGNOSIS — B3731 Acute candidiasis of vulva and vagina: Secondary | ICD-10-CM

## 2020-02-03 DIAGNOSIS — E1169 Type 2 diabetes mellitus with other specified complication: Secondary | ICD-10-CM

## 2020-02-03 DIAGNOSIS — E559 Vitamin D deficiency, unspecified: Secondary | ICD-10-CM

## 2020-02-03 DIAGNOSIS — K219 Gastro-esophageal reflux disease without esophagitis: Secondary | ICD-10-CM

## 2020-02-03 DIAGNOSIS — E785 Hyperlipidemia, unspecified: Secondary | ICD-10-CM

## 2020-02-03 LAB — POCT GLYCOSYLATED HEMOGLOBIN (HGB A1C): Hemoglobin A1C: 7.9 % — AB (ref 4.0–5.6)

## 2020-02-03 MED ORDER — ERTUGLIFLOZIN-SITAGLIPTIN 15-100 MG PO TABS: 1.0000 | ORAL_TABLET | Freq: Every day | ORAL | 2 refills | Status: DC

## 2020-02-03 MED ORDER — MELOXICAM 15 MG PO TABS: 15.0000 mg | ORAL_TABLET | Freq: Every day | ORAL | 0 refills | Status: DC

## 2020-02-03 MED ORDER — MONTELUKAST SODIUM 10 MG PO TABS: 10.0000 mg | ORAL_TABLET | Freq: Every day | ORAL | 1 refills | Status: DC

## 2020-02-03 MED ORDER — OLMESARTAN MEDOXOMIL-HCTZ 20-12.5 MG PO TABS: 1.0000 | ORAL_TABLET | Freq: Every day | ORAL | 1 refills | Status: DC

## 2020-02-03 MED ORDER — FLUCONAZOLE 150 MG PO TABS: 150.0000 mg | ORAL_TABLET | ORAL | 0 refills | Status: DC

## 2020-02-03 MED ORDER — OMEPRAZOLE 20 MG PO CPDR: 20.0000 mg | DELAYED_RELEASE_CAPSULE | Freq: Every day | ORAL | 1 refills | Status: DC

## 2020-02-03 MED ORDER — BUDESONIDE-FORMOTEROL FUMARATE 160-4.5 MCG/ACT IN AERO: 2.0000 | INHALATION_SPRAY | Freq: Two times a day (BID) | RESPIRATORY_TRACT | 2 refills | Status: DC

## 2020-02-03 NOTE — Progress Notes (Signed)
Name: Kelsey Alvarez   MRN: [id number]    DOB: 15-Dec-1970   Date:02/03/2020       Progress Note  Subjective  Chief Complaint  Follow  Up  HPI  CH:YIFO2D was 7.2% it was down to  6.9% and is up again at 7.9% she  was switched from [place] toXigduo but that cause dyspesia also and is now on Farxiga, but has noticed that shortly after taking medication she feels " weird" affects her vision, does not like it, we gave her Januvia but is not controlling her levels, she would like to try going back to [place]. .She still has neuropathy symptoms ( hands and feet) but doing better on gabapentin. She is due for labs and urine micro She has noticed polyuria, polyphagia and polydipsia lately. She had a few episodes of bronchitis, and has taken 2 rounds of prednisone over the past 2 months.    Low back pain with radiculitis: went to [facility] back in 08/2017, after that referred to Neurologist and had EMG that showed neuropathy, she states pain radiates down both posterior thighs when sitting for a long   Morbid Obesity:off Ozempic, she stopped because she could not eat anything, she has gained almost 10 lbs since the beginning of the year, discussed importance of exercising daily   Asthma:she has moderate asthma, she is currently using Symbicort BID,she had a few episodes of bronchitis since last visit with me, went to urgent care and was given prednisone twice and cough medication , feeling better now, she still has an intermittent dry cough and has sob with moderate activity, no wheezing at this time  Hyperlipidemia:she states she has been taking Atorvastatin  daily now, and is tolerating it well, Last LDL was down to 99, goal is 70 , we will recheck labs before adjusting dose  HTN:she denies  side effects of medication. Compliant with medicationand bp is at goal , very seldom has palpitation, no chest pain  Yeast vaginitis: some itching noticed in the past week, we will try diflucan  Patient  Active Problem List   Diagnosis Date Noted  . Type 2 diabetes, controlled, with peripheral neuropathy ([place]) 01/20/2018  . Endometriosis 11/06/2017  . Menorrhagia with irregular cycle 10/28/2017  . Fibroid uterus 10/28/2017  . Carpal tunnel syndrome on both sides 10/17/2017  . B12 deficiency 04/27/2016  . History of endometriosis 11/17/2015  . Oligomenorrhea 10/12/2015  . Benign essential HTN 12/28/2014  . Cervical polyp 12/28/2014  . Controlled type 2 diabetes mellitus with microalbuminuria ([place]) 12/28/2014  . Dyslipidemia 12/28/2014  . Dysmenorrhea 12/28/2014  . Edema 12/28/2014  . Female infertility 12/28/2014  . Gastro-esophageal reflux disease without esophagitis 12/28/2014  . Morbid obesity ([place]) 12/28/2014  . Asthma, moderate persistent, poorly-controlled 12/28/2014  . Perennial allergic rhinitis 12/28/2014  . Intermittent tremor 12/28/2014  . Vitamin D deficiency 12/28/2014  . Intermittent low back pain 12/28/2014  . Left sciatic nerve pain 12/28/2014    Past Surgical History:  Procedure Laterality Date  . APPENDECTOMY    . cervical polyp removal    . DILATION AND CURETTAGE OF UTERUS    . HYSTEROSCOPY    . HYSTEROSCOPY WITH D & C N/A 10/12/2015   Procedure: DILATATION AND CURETTAGE /HYSTEROSCOPY;  Surgeon: Will Bonnet, MD;  Location: [facility] ORS;  Service: Gynecology;  Laterality: N/A;  . LAPAROSCOPIC BILATERAL SALPINGO OOPHERECTOMY N/A 10/12/2015   Procedure: LAPAROSCOPIC RIGHT SALPINGECTOMY, LEFT OOPHORECTOMY;  Surgeon: Will Bonnet, MD;  Location: [facility] ORS;  Service: Gynecology;  Laterality: N/A;  . LAPAROSCOPIC OVARIAN CYSTECTOMY Bilateral 10/12/2015   Procedure: LAPAROSCOPIC OVARIAN CYSTECTOMY;  Surgeon: Will Bonnet, MD;  Location: [facility] ORS;  Service: Gynecology;  Laterality: Bilateral;  . OOPHORECTOMY    . OVARIAN CYST REMOVAL      Family History  Problem Relation Age of Onset  . Hypertension Mother   . Diabetes Mother   . CAD Mother   . Cancer  Mother        cervical  . Heart attack Mother 58       Triple Bypass  . CAD Father   . Hypertension Father   . Arthritis Father   . Thyroid disease Sister   . Alzheimer's disease Maternal Grandmother   . Alzheimer's disease Maternal Grandfather   . Breast cancer Paternal Grandmother     Social History   Tobacco Use  . Smoking status: Former Smoker    Years: 1.00    Types: Cigarettes    Start date: 12/03/2001    Quit date: 12/28/2002    Years since quitting: 17.1  . Smokeless tobacco: Never Used  . Tobacco comment: smoked 3 to 4 cigarrettes a day for a year  Substance Use Topics  . Alcohol use: Yes    Alcohol/week: 0.0 standard drinks    Comment: occasionally drinks margarita, once or twice a year     Current Outpatient Medications:  .  albuterol (VENTOLIN HFA) 108 (90 Base) MCG/ACT inhaler, Inhale 2 puffs into the lungs every 6 (six) hours as needed for wheezing or shortness of breath., Disp: 18 g, Rfl: 0 .  Alpha Lipoic Acid 200 MG CAPS, Take by mouth., Disp: , Rfl:  .  aspirin EC 81 MG tablet, Take 1 tablet (81 mg total) by mouth daily., Disp: 30 tablet, Rfl: 0 .  atorvastatin (LIPITOR) 40 MG tablet, Take 1 tablet (40 mg total) by mouth daily., Disp: 30 tablet, Rfl: 3 .  budesonide-formoterol (SYMBICORT) 160-4.5 MCG/ACT inhaler, Inhale 2 puffs into the lungs 2 (two) times daily., Disp: 1 each, Rfl: 2 .  Cetirizine HCl (ZYRTEC PO), Take 10 mg by mouth daily., Disp: , Rfl:  .  Cholecalciferol (VITAMIN D) 2000 UNITS tablet, Take 1 tablet by mouth daily., Disp: , Rfl:  .  fluticasone (FLONASE) 50 MCG/ACT nasal spray, Place 2 sprays into both nostrils daily., Disp: 16 g, Rfl: 5 .  ipratropium-albuterol (DUONEB) 0.5-2.5 (3) MG/3ML SOLN, Take 3 mLs by nebulization every 4 (four) hours as needed., Disp: 120 mL, Rfl: 0 .  meloxicam (MOBIC) 15 MG tablet, Take 1 tablet (15 mg total) by mouth daily., Disp: 90 tablet, Rfl: 0 .  montelukast (SINGULAIR) 10 MG tablet, Take 1 tablet (10 mg  total) by mouth at bedtime., Disp: 90 tablet, Rfl: 1 .  olmesartan-hydrochlorothiazide (BENICAR HCT) 20-12.5 MG tablet, Take 1 tablet by mouth daily., Disp: 90 tablet, Rfl: 1 .  omeprazole (PRILOSEC) 20 MG capsule, Take 1 capsule (20 mg total) by mouth daily., Disp: 90 capsule, Rfl: 1 .  Ertugliflozin-SITagliptin 15-100 MG TABS, Take 1 tablet by mouth daily., Disp: 30 tablet, Rfl: 2 .  fluconazole (DIFLUCAN) 150 MG tablet, Take 1 tablet (150 mg total) by mouth every other day., Disp: 3 tablet, Rfl: 0 .  gabapentin (NEURONTIN) 300 MG capsule, Take 1 capsule (300 mg total) by mouth 3 (three) times daily. (Patient taking differently: Take 300 mg by mouth at bedtime. ), Disp: 90 capsule, Rfl: 2  Allergies  Allergen Reactions  . Fish Allergy Shortness Of Breath  .  Penicillins Anaphylaxis and Rash    Childhood allergy  . Levofloxacin Other (See Comments)    Cramping  . Adhesive [Tape] Rash    Paper tape ok to use.    I personally reviewed active problem list, medication list, allergies, family history, social history, health maintenance, notes from last encounter with the patient/caregiver today.   ROS  Constitutional: Negative for fever , positive for weight change.  Respiratory: positive for cough and shortness of breath.   Cardiovascular: Negative for chest pain, she has occasional  palpitations.  Gastrointestinal: Negative for abdominal pain, no bowel changes.  Musculoskeletal: Negative for gait problem or joint swelling.  Skin: Negative for rash.  Neurological: Negative for dizziness or headache.  No other specific complaints in a complete review of systems (except as listed in HPI above).  Objective  Vitals:   02/03/20 1050  BP: 136/74  Pulse: 89  Resp: 16  Temp: 98.5 F (36.9 C)  TempSrc: Oral  SpO2: 96%  Weight: 284 lb 6.4 oz (129 kg)  Height: 5\' 2"  (1.575 m)    Body mass index is 52.02 kg/m.  Physical Exam  Constitutional: Patient appears well-developed and  well-nourished. Obese  No distress.  HEENT: head atraumatic, normocephalic, pupils equal and reactive to light,  neck supple Cardiovascular: Normal rate, regular rhythm and normal heart sounds.  No murmur heard. No BLE edema. Pulmonary/Chest: Effort normal and breath sounds normal. No respiratory distress. Abdominal: Soft.  There is no tenderness. Psychiatric: Patient has a normal mood and affect. behavior is normal. Judgment and thought content normal.  Recent Results (from the past 2160 hour(s))  POCT HgB A1C     Status: Abnormal   Collection Time: 02/03/20 10:54 AM  Result Value Ref Range   Hemoglobin A1C 7.9 (A) 4.0 - 5.6 %   HbA1c POC (<> result, manual entry)     HbA1c, POC (prediabetic range)     HbA1c, POC (controlled diabetic range)      Diabetic Foot Exam: Diabetic Foot Exam - Simple   Simple Foot Form Diabetic Foot exam was performed with the following findings: Yes 02/03/2020 11:33 AM  Visual Inspection No deformities, no ulcerations, no other skin breakdown bilaterally: Yes Sensation Testing Intact to touch and monofilament testing bilaterally: Yes Pulse Check Posterior Tibialis and Dorsalis pulse intact bilaterally: Yes Comments      PHQ2/9: Depression screen [facility] 2/9 02/03/2020 01/22/2020 05/12/2019 04/10/2019 01/06/2019  Decreased Interest 0 0 0 0 0  Down, Depressed, Hopeless 0 0 0 0 0  PHQ - 2 Score 0 0 0 0 0  Altered sleeping 2 2 0 0 1  Tired, decreased energy 1 1 0 0 3  Change in appetite 0 0 0 0 0  Feeling bad or failure about yourself  0 0 0 0 0  Trouble concentrating 0 0 0 0 0  Moving slowly or fidgety/restless 0 0 0 0 0  Suicidal thoughts 0 0 0 0 0  PHQ-9 Score 3 3 0 0 4  Difficult doing work/chores - Not difficult at all Not difficult at all Not difficult at all Not difficult at all  Some recent data might be hidden    phq 9 is negative   Fall Risk: Fall Risk  02/03/2020 01/22/2020 05/12/2019 04/10/2019 01/06/2019  Falls in the past year? 0 0 0 0 0   Number falls in past yr: 0 0 0 0 0  Injury with Fall? 0 0 0 0 0  Follow up - - - - -  Functional Status Survey: Is the patient deaf or have difficulty hearing?: No Does the patient have difficulty seeing, even when wearing glasses/contacts?: No Does the patient have difficulty concentrating, remembering, or making decisions?: No Does the patient have difficulty walking or climbing stairs?: Yes Does the patient have difficulty dressing or bathing?: No Does the patient have difficulty doing errands alone such as visiting a doctor's office or shopping?: No    Assessment & Plan  1. Type 2 diabetes, controlled, with peripheral neuropathy (HCC)  - POCT HgB A1C - Microalbumin / creatinine urine ratio  2. Asthma, moderate persistent, well-controlled  - montelukast (SINGULAIR) 10 MG tablet; Take 1 tablet (10 mg total) by mouth at bedtime.  Dispense: 90 tablet; Refill: 1  3. Morbid obesity ([place])  Discussed with the patient the risk posed by an increased BMI. Discussed importance of portion control, calorie counting and at least 150 minutes of physical activity weekly. Avoid sweet beverages and drink more water. Eat at least 6 servings of fruit and vegetables daily   4. Benign essential HTN  - COMPLETE METABOLIC PANEL WITH GFR - CBC with Differential/Platelet - olmesartan-hydrochlorothiazide (BENICAR HCT) 20-12.5 MG tablet; Take 1 tablet by mouth daily.  Dispense: 90 tablet; Refill: 1  5. Dyslipidemia associated with type 2 diabetes mellitus (HCC)  - Lipid panel  6. Gastro-esophageal reflux disease without esophagitis  - omeprazole (PRILOSEC) 20 MG capsule; Take 1 capsule (20 mg total) by mouth daily.  Dispense: 90 capsule; Refill: 1  7. Vitamin D deficiency   8. B12 deficiency  - Vitamin B12  9. Perennial allergic rhinitis   10. Other fatigue  - TSH  11. Asthma, moderate persistent, poorly-controlled  - budesonide-formoterol (SYMBICORT) 160-4.5 MCG/ACT inhaler;  Inhale 2 puffs into the lungs 2 (two) times daily.  Dispense: 1 each; Refill: 2  12. Rotator cuff tendinitis, right  - meloxicam (MOBIC) 15 MG tablet; Take 1 tablet (15 mg total) by mouth daily.  Dispense: 90 tablet; Refill: 0  13. Yeast vaginitis  - fluconazole (DIFLUCAN) 150 MG tablet; Take 1 tablet (150 mg total) by mouth every other day.  Dispense: 3 tablet; Refill: 0

## 2020-02-04 LAB — COMPLETE METABOLIC PANEL WITH GFR
AG Ratio: 1.5 (calc) (ref 1.0–2.5)
ALT: 33 U/L — ABNORMAL HIGH (ref 6–29)
AST: 23 U/L (ref 10–35)
Albumin: 4 g/dL (ref 3.6–5.1)
Alkaline phosphatase (APISO): 73 U/L (ref 31–125)
BUN/Creatinine Ratio: 7 (calc) (ref 6–22)
BUN: 5 mg/dL — ABNORMAL LOW (ref 7–25)
CO2: 29 mmol/L (ref 20–32)
Calcium: 9.4 mg/dL (ref 8.6–10.2)
Chloride: 100 mmol/L (ref 98–110)
Creat: 0.67 mg/dL (ref 0.50–1.10)
GFR, Est African American: 121 mL/min/{1.73_m2} (ref >=60)
GFR, Est Non African American: 105 mL/min/{1.73_m2} (ref >=60)
Globulin: 2.7 g/dL (calc) (ref 1.9–3.7)
Glucose, Bld: 157 mg/dL — ABNORMAL HIGH (ref 65–99)
Potassium: 3.7 mmol/L (ref 3.5–5.3)
Sodium: 139 mmol/L (ref 135–146)
Total Bilirubin: 0.3 mg/dL (ref 0.2–1.2)
Total Protein: 6.7 g/dL (ref 6.1–8.1)

## 2020-02-04 LAB — MICROALBUMIN / CREATININE URINE RATIO
Creatinine, Urine: 119 mg/dL (ref 20–275)
Microalb Creat Ratio: 3 mcg/mg creat (ref <30)
Microalb, Ur: 0.3 mg/dL

## 2020-02-04 LAB — TSH: TSH: 1.12 mIU/L

## 2020-02-04 LAB — CBC WITH DIFFERENTIAL/PLATELET
Absolute Monocytes: 439 cells/uL (ref 200–950)
Basophils Absolute: 72 cells/uL (ref 0–200)
Basophils Relative: 1 %
Eosinophils Absolute: 108 cells/uL (ref 15–500)
Eosinophils Relative: 1.5 %
HCT: 41.5 % (ref 35.0–45.0)
Hemoglobin: 14.3 g/dL (ref 11.7–15.5)
Lymphs Abs: 2282 cells/uL (ref 850–3900)
MCH: 27.1 pg (ref 27.0–33.0)
MCHC: 34.5 g/dL (ref 32.0–36.0)
MCV: 78.6 fL — ABNORMAL LOW (ref 80.0–100.0)
MPV: 10 fL (ref 7.5–12.5)
Monocytes Relative: 6.1 %
Neutro Abs: 4298 cells/uL (ref 1500–7800)
Neutrophils Relative %: 59.7 %
Platelets: 272 10*3/uL (ref 140–400)
RBC: 5.28 10*6/uL — ABNORMAL HIGH (ref 3.80–5.10)
RDW: 14.5 % (ref 11.0–15.0)
Total Lymphocyte: 31.7 %
WBC: 7.2 10*3/uL (ref 3.8–10.8)

## 2020-02-04 LAB — LIPID PANEL
Cholesterol: 182 mg/dL (ref <200)
HDL: 38 mg/dL — ABNORMAL LOW (ref >=50)
LDL Cholesterol (Calc): 117 mg/dL (calc) — ABNORMAL HIGH
Non-HDL Cholesterol (Calc): 144 mg/dL (calc) — ABNORMAL HIGH (ref <130)
Total CHOL/HDL Ratio: 4.8 (calc) (ref <5.0)
Triglycerides: 159 mg/dL — ABNORMAL HIGH (ref <150)

## 2020-02-04 LAB — VITAMIN B12: Vitamin B-12: 776 pg/mL (ref 200–1100)

## 2020-02-04 LAB — VITAMIN D 25 HYDROXY (VIT D DEFICIENCY, FRACTURES): Vit D, 25-Hydroxy: 28 ng/mL — ABNORMAL LOW (ref 30–100)

## 2020-02-25 ENCOUNTER — Other Ambulatory Visit: Payer: Self-pay | Admitting: Family Medicine

## 2020-02-25 DIAGNOSIS — E1129 Type 2 diabetes mellitus with other diabetic kidney complication: Secondary | ICD-10-CM

## 2020-03-03 ENCOUNTER — Other Ambulatory Visit: Payer: Self-pay | Admitting: Family Medicine

## 2020-03-03 DIAGNOSIS — E1129 Type 2 diabetes mellitus with other diabetic kidney complication: Secondary | ICD-10-CM

## 2020-03-03 NOTE — Telephone Encounter (Signed)
Requested medication (s) are due for refill today:  ??  Requested medication (s) are on the active medication list:  No  Future visit scheduled:  No  Last Refill:   Notes to Clinic:  in office note of 02/03/20, Dr. Ancil Boozer documented that pt. Would like to try Farixga again.  Please review and advise.   Requested Prescriptions  Pending Prescriptions Disp Refills   FARXIGA 10 MG TABS tablet [Pharmacy Med Name: Farxiga 10 MG Oral Tablet] 30 tablet 0    Sig: Take 1 tablet by mouth once daily      Endocrinology:  Diabetes - SGLT2 Inhibitors Failed - 03/03/2020 11:39 AM      Failed - LDL in normal range and within 360 days    LDL Cholesterol (Calc)  Date Value Ref Range Status  02/03/2020 117 (H) mg/dL (calc) Final    Comment:    Reference range: <100 . Desirable range <100 mg/dL for primary prevention;   <70 mg/dL for patients with CHD or diabetic patients  with > or = 2 CHD risk factors. Marland Kitchen LDL-C is now calculated using the Martin-Hopkins  calculation, which is a validated novel method providing  better accuracy than the Friedewald equation in the  estimation of LDL-C.  Cresenciano Genre et al. Annamaria Helling. 1610;960(45): 2061-2068  (http://education.[facility].com/faq/FAQ164)           Passed - Cr in normal range and within 360 days    Creat  Date Value Ref Range Status  02/03/2020 0.67 0.50 - 1.10 mg/dL Final   Creatinine, Urine  Date Value Ref Range Status  02/03/2020 119 20 - 275 mg/dL Final          Passed - HBA1C is between 0 and 7.9 and within 180 days    Hemoglobin A1C  Date Value Ref Range Status  02/03/2020 7.9 (A) 4.0 - 5.6 % Final   HbA1c, POC (controlled diabetic range)  Date Value Ref Range Status  04/25/2018 6.6 0.0 - 7.0 % Final          Passed - eGFR in normal range and within 360 days    GFR, Est African American  Date Value Ref Range Status  02/03/2020 121 > OR = 60 mL/min/1.68m Final   GFR, Est Non African American  Date Value Ref Range Status   02/03/2020 105 > OR = 60 mL/min/1.775mFinal          Passed - Valid encounter within last 6 months    Recent Outpatient Visits           4 weeks ago [facility], [facility], [facility]   1 month ago Benign essential HTN   [facility]   9 months ago Acute foot pain, right   [facility]   10 months ago Type 2 diabetes, controlled, with peripheral [facility]  [facility]   1 year ago Type 2 [facility], [facility], with peripheral [facility]  [facility]

## 2020-03-07 ENCOUNTER — Telehealth: Payer: Self-pay | Admitting: Family Medicine

## 2020-03-07 NOTE — Telephone Encounter (Signed)
We dont know who has this medication, pt may go online and get a voucher

## 2020-03-07 NOTE — Telephone Encounter (Signed)
Pt called in and states that she was told to take a new medication for diabetes in November.  However, patient took what she had at home and did not try to get the new medication right away.  Patient is just now trying to get new medication filled (unsure of name of medication) however insurance is denying.  Patient would like to know what she needs to do as far as diabetic medication at this time. Patient can be reached at [phone].

## 2020-03-09 ENCOUNTER — Other Ambulatory Visit: Payer: Self-pay | Admitting: Family Medicine

## 2020-03-09 DIAGNOSIS — E1142 Type 2 diabetes mellitus with diabetic polyneuropathy: Secondary | ICD-10-CM

## 2020-03-09 MED ORDER — DAPAGLIFLOZIN PROPANEDIOL 10 MG PO TABS: 10.0000 mg | ORAL_TABLET | Freq: Every day | ORAL | 2 refills | Status: DC

## 2020-03-09 MED ORDER — SITAGLIPTIN PHOSPHATE 100 MG PO TABS: 100.0000 mg | ORAL_TABLET | Freq: Every day | ORAL | 2 refills | Status: DC

## 2020-03-09 NOTE — Telephone Encounter (Signed)
Pt is asking if you can put her back on the Farxiga?

## 2020-03-14 ENCOUNTER — Encounter: Payer: Self-pay | Admitting: Family Medicine

## 2020-03-14 ENCOUNTER — Other Ambulatory Visit: Payer: Self-pay | Admitting: Family Medicine

## 2020-03-24 ENCOUNTER — Telehealth: Payer: Self-pay

## 2020-03-24 NOTE — Telephone Encounter (Signed)
Copied from [place] [phone]. Topic: Quick Communication - See Telephone Encounter >> Mar 24, 2020  9:46 AM Loma Boston wrote: CRM for notification. See Telephone encounter for: 03/24/20. Pt had called ins They need a PA on the  Ins with [employer], [place]. If nurse could call so could be discussed [phone]

## 2020-03-24 NOTE — Telephone Encounter (Signed)
Copied from [place] [phone]. Topic: General - Other >> Mar 24, 2020 12:40 PM Yvette Rack wrote: Reason for CRM: Pt requested to speak with Dr. Ancil Boozer nurse. Pt stated she needs the PA to be re-sent to [place], [place]. Pt requests call back

## 2020-04-28 ENCOUNTER — Telehealth: Payer: Self-pay

## 2020-04-28 NOTE — Telephone Encounter (Signed)
Copied from [place] [phone]. Topic: General - Other >> Apr 28, 2020  1:56 PM Pawlus, Kelsey Alvarez wrote: Reason for CRM: Pt wanted to discuss possibly seeing [facility]varez heart specialist. Please advise.

## 2020-04-29 NOTE — Telephone Encounter (Signed)
Tried contacting pt 3x keep getting busy signal. Will try again later

## 2020-05-02 NOTE — Progress Notes (Signed)
Name: Kelsey Alvarez   MRN: [id number]    DOB: 1970-10-09   Date:05/04/2020       Progress Note  Subjective  Chief Complaint  Cardiology Referral  HPI  Abnormal EKG: found during COVID-19 visit to Urgent Care, she also had low potassium. She is concerned because mother had heart disease and triple bypass surgery at age 48. Offered to recheck EKG and potassium today but she feels safer seeing cardiologist   QJ:[id number] 79%, she was switched from [place] toXigduo but that cause dyspesia also and isnow onFarxiga. Glucose is still high between 180-200's fasting. She is willing to try taking Xigduo again, also discussed Rybelsus and she wants to try it also. She is aware similar to Ozempic. Marland Kitchen .She still has neuropathy symptoms ( hands and feet) but doing better on gabapentin. She has changed her diet, cutting down on sweet beverages, avoiding chips, eating more salads for the past few months but glucose is still high. She denies polyphagia, but has noticed polydipsia and polyuria. She has noticed vaginal itching   Morbid Obesity:off Ozempic, she stopped because she could not eat anything, she lost 12 lbs since last visit, she had COVID in January 2022   Asthma:she has moderate asthma,she is currently using Symbicort BID, she had COVID in January and has noticed increase in sob since, also nocturnal cough a few times a week, we will try switching to Trelegy and monitor   Hyperlipidemia:she states she has been taking Atorvastatin  daily now, and is tolerating it well, Last LDL still not at goal, she is willing to change to Crestor 40 mg and recheck next visit   HTN:she denies  side effects of medication. Compliant with medicationand bp is slightly elevated, very seldom has palpitation, no chest pain  Yeast vaginitis: some itching noticed in the past week, we will send refills of diflucan   Patient Active Problem List   Diagnosis Date Noted  . Type 2 diabetes, controlled, with  peripheral neuropathy ([place]) 01/20/2018  . Endometriosis 11/06/2017  . Menorrhagia with irregular cycle 10/28/2017  . Fibroid uterus 10/28/2017  . Carpal tunnel syndrome on both sides 10/17/2017  . B12 deficiency 04/27/2016  . History of endometriosis 11/17/2015  . Oligomenorrhea 10/12/2015  . Benign essential HTN 12/28/2014  . Cervical polyp 12/28/2014  . Controlled type 2 diabetes mellitus with microalbuminuria ([place]) 12/28/2014  . Dyslipidemia 12/28/2014  . Dysmenorrhea 12/28/2014  . Edema 12/28/2014  . Female infertility 12/28/2014  . Gastro-esophageal reflux disease without esophagitis 12/28/2014  . Morbid obesity ([place]) 12/28/2014  . Asthma, moderate persistent, poorly-controlled 12/28/2014  . Perennial allergic rhinitis 12/28/2014  . Intermittent tremor 12/28/2014  . Vitamin D deficiency 12/28/2014  . Intermittent low back pain 12/28/2014  . Left sciatic nerve pain 12/28/2014    Past Surgical History:  Procedure Laterality Date  . APPENDECTOMY    . cervical polyp removal    . DILATION AND CURETTAGE OF UTERUS    . HYSTEROSCOPY    . HYSTEROSCOPY WITH D & C N/A 10/12/2015   Procedure: DILATATION AND CURETTAGE /HYSTEROSCOPY;  Surgeon: Will Bonnet, MD;  Location: [facility] ORS;  Service: Gynecology;  Laterality: N/A;  . LAPAROSCOPIC BILATERAL SALPINGO OOPHERECTOMY N/A 10/12/2015   Procedure: LAPAROSCOPIC RIGHT SALPINGECTOMY, LEFT OOPHORECTOMY;  Surgeon: Will Bonnet, MD;  Location: [facility] ORS;  Service: Gynecology;  Laterality: N/A;  . LAPAROSCOPIC OVARIAN CYSTECTOMY Bilateral 10/12/2015   Procedure: LAPAROSCOPIC OVARIAN CYSTECTOMY;  Surgeon: Will Bonnet, MD;  Location: [facility] ORS;  Service: Gynecology;  Laterality: Bilateral;  . OOPHORECTOMY    . OVARIAN CYST REMOVAL      Family History  Problem Relation Age of Onset  . Hypertension Mother   . Diabetes Mother   . CAD Mother   . Cancer Mother        cervical  . Heart attack Mother 89       Triple Bypass  . CAD  Father   . Hypertension Father   . Arthritis Father   . Thyroid disease Sister   . Alzheimer's disease Maternal Grandmother   . Alzheimer's disease Maternal Grandfather   . Breast cancer Paternal Grandmother     Social History   Tobacco Use  . Smoking status: Former Smoker    Years: 1.00    Types: Cigarettes    Start date: 12/03/2001    Quit date: 12/28/2002    Years since quitting: 17.3  . Smokeless tobacco: Never Used  . Tobacco comment: smoked 3 to 4 cigarrettes a day for a year  Substance Use Topics  . Alcohol use: Yes    Alcohol/week: 0.0 standard drinks    Comment: occasionally drinks margarita, once or twice a year     Current Outpatient Medications:  .  albuterol (VENTOLIN HFA) 108 (90 Base) MCG/ACT inhaler, Inhale 2 puffs into the lungs every 6 (six) hours as needed for wheezing or shortness of breath., Disp: 18 g, Rfl: 0 .  Alpha Lipoic Acid 200 MG CAPS, Take by mouth., Disp: , Rfl:  .  aspirin EC 81 MG tablet, Take 1 tablet (81 mg total) by mouth daily., Disp: 30 tablet, Rfl: 0 .  Cetirizine HCl (ZYRTEC PO), Take 10 mg by mouth daily., Disp: , Rfl:  .  Cholecalciferol (VITAMIN D) 2000 UNITS tablet, Take 1 tablet by mouth daily., Disp: , Rfl:  .  Dapagliflozin-metFORMIN HCl ER (XIGDUO XR) 10-500 MG TB24, Take 1 tablet by mouth daily., Disp: 90 tablet, Rfl: 0 .  ergocalciferol (VITAMIN D2) 1.25 MG (50000 UT) capsule, Take 1 capsule by mouth once a week., Disp: , Rfl:  .  fluticasone (FLONASE) 50 MCG/ACT nasal spray, Place 2 sprays into both nostrils daily., Disp: 16 g, Rfl: 5 .  Fluticasone-Umeclidin-Vilant (TRELEGY ELLIPTA) 100-62.5-25 MCG/INH AEPB, Inhale 1 puff into the lungs daily., Disp: 60 each, Rfl: 2 .  ipratropium-albuterol (DUONEB) 0.5-2.5 (3) MG/3ML SOLN, Take 3 mLs by nebulization every 4 (four) hours as needed., Disp: 120 mL, Rfl: 0 .  meloxicam (MOBIC) 15 MG tablet, Take 1 tablet (15 mg total) by mouth daily., Disp: 90 tablet, Rfl: 0 .  montelukast  (SINGULAIR) 10 MG tablet, Take 1 tablet (10 mg total) by mouth at bedtime., Disp: 90 tablet, Rfl: 1 .  olmesartan-hydrochlorothiazide (BENICAR HCT) 20-12.5 MG tablet, Take 1 tablet by mouth daily., Disp: 90 tablet, Rfl: 1 .  omeprazole (PRILOSEC) 20 MG capsule, Take 1 capsule (20 mg total) by mouth daily., Disp: 90 capsule, Rfl: 1 .  potassium chloride SA (KLOR-CON) 20 MEQ tablet, Take 20 mEq by mouth 2 (two) times daily., Disp: , Rfl:  .  Rosuvastatin Calcium 40 MG CPSP, Take 1 tablet by mouth daily., Disp: 90 capsule, Rfl: 1 .  Semaglutide (RYBELSUS) 7 MG TABS, Take 1 tablet by mouth daily., Disp: 30 tablet, Rfl: 2 .  gabapentin (NEURONTIN) 300 MG capsule, Take 1 capsule (300 mg total) by mouth 3 (three) times daily. (Patient taking differently: Take 300 mg by mouth at bedtime. ), Disp: 90 capsule, Rfl: 2  Allergies  Allergen  Reactions  . Fish Allergy Shortness Of Breath  . Penicillins Anaphylaxis and Rash    Childhood allergy  . Levofloxacin Other (See Comments)    Cramping  . Adhesive [Tape] Rash    Paper tape ok to use.    I personally reviewed active problem list, medication list, allergies, family history, social history, health maintenance with the patient/caregiver today.   ROS  Constitutional: Negative for fever , positive  weight change.  Respiratory:positive for cough and shortness of breath.   Cardiovascular: Negative for chest pain , positive for intermittent  palpitations.  Gastrointestinal: Negative for abdominal pain, no bowel changes.  Musculoskeletal: Negative for gait problem or joint swelling.  Skin: Negative for rash.  Neurological: Negative for dizziness or headache.  No other specific complaints in a complete review of systems (except as listed in HPI above).  Objective  Vitals:   05/04/20 1409  BP: 132/76  Pulse: 64  Resp: 16  Temp: 98.4 F (36.9 C)  TempSrc: Oral  SpO2: 96%  Weight: 272 lb 6.4 oz (123.6 kg)  Height: 5\' 2"  (1.575 m)    Body  mass index is 49.82 kg/m.  Physical Exam  Constitutional: Patient appears well-developed and well-nourished. Obese  No distress.  HEENT: head atraumatic, normocephalic, pupils equal and reactive to light, ears normal TM bilatearlly,  neck supple, throat within normal limits Cardiovascular: Normal rate, regular rhythm and normal heart sounds.  No murmur heard. No BLE edema. Pulmonary/Chest: Effort normal and breath sounds normal. No respiratory distress. Abdominal: Soft.  There is no tenderness. Psychiatric: Patient has a normal mood and affect. behavior is normal. Judgment and thought content normal.  PHQ2/9: Depression screen [facility] 2/9 05/04/2020 02/03/2020 01/22/2020 05/12/2019 04/10/2019  Decreased Interest 0 0 0 0 0  Down, Depressed, Hopeless 0 0 0 0 0  PHQ - 2 Score 0 0 0 0 0  Altered sleeping - 2 2 0 0  Tired, decreased energy - 1 1 0 0  Change in appetite - 0 0 0 0  Feeling bad or failure about yourself  - 0 0 0 0  Trouble concentrating - 0 0 0 0  Moving slowly or fidgety/restless - 0 0 0 0  Suicidal thoughts - 0 0 0 0  PHQ-9 Score - 3 3 0 0  Difficult doing work/chores - - Not difficult at all Not difficult at all Not difficult at all  Some recent data might be hidden    phq 9 is negative   Fall Risk: Fall Risk  05/04/2020 02/03/2020 01/22/2020 05/12/2019 04/10/2019  Falls in the past year? 0 0 0 0 0  Number falls in past yr: 0 0 0 0 0  Injury with Fall? 0 0 0 0 0  Follow up - - - - -     Functional Status Survey: Is the patient deaf or have difficulty hearing?: No Does the patient have difficulty seeing, even when wearing glasses/contacts?: No Does the patient have difficulty concentrating, remembering, or making decisions?: No Does the patient have difficulty walking or climbing stairs?: Yes (Asthma) Does the patient have difficulty dressing or bathing?: No Does the patient have difficulty doing errands alone such as visiting a doctor's office or shopping?:  No    Assessment & Plan  1. Type 2 diabetes, controlled, with peripheral neuropathy (HCC)  - Dapagliflozin-metFORMIN HCl ER (XIGDUO XR) 10-500 MG TB24; Take 1 tablet by mouth daily.  Dispense: 90 tablet; Refill: 0 - POCT HgB A1C - Semaglutide (RYBELSUS) 7 MG  TABS; Take 1 tablet by mouth daily.  Dispense: 30 tablet; Refill: 2  2. Abnormal EKG  - Ambulatory referral to Cardiology  3. Asthma, moderate persistent, poorly-controlled  - Fluticasone-Umeclidin-Vilant (TRELEGY ELLIPTA) 100-62.5-25 MCG/INH AEPB; Inhale 1 puff into the lungs daily.  Dispense: 60 each; Refill: 2  4. Colon cancer screening  - Ambulatory referral to Gastroenterology  5. Morbid obesity ([place])  Discussed with the patient the risk posed by an increased BMI. Discussed importance of portion control, calorie counting and at least 150 minutes of physical activity weekly. Avoid sweet beverages and drink more water. Eat at least 6 servings of fruit and vegetables daily   6. Dyslipidemia associated with type 2 diabetes mellitus (HCC)  - Rosuvastatin Calcium 40 MG CPSP; Take 1 tablet by mouth daily.  Dispense: 90 capsule; Refill: 1

## 2020-05-02 NOTE — Telephone Encounter (Signed)
appt scheduled for this Wednesday with Dr Ancil Boozer

## 2020-05-04 ENCOUNTER — Ambulatory Visit: Payer: 59 | Admitting: Family Medicine

## 2020-05-04 ENCOUNTER — Other Ambulatory Visit: Payer: Self-pay | Admitting: Family Medicine

## 2020-05-04 ENCOUNTER — Other Ambulatory Visit: Payer: Self-pay

## 2020-05-04 ENCOUNTER — Encounter: Payer: Self-pay | Admitting: Family Medicine

## 2020-05-04 VITALS — BP 132/76 | HR 64 | Temp 98.4°F | Resp 16 | Ht 62.0 in | Wt 272.4 lb

## 2020-05-04 DIAGNOSIS — E1142 Type 2 diabetes mellitus with diabetic polyneuropathy: Secondary | ICD-10-CM | POA: Diagnosis not present

## 2020-05-04 DIAGNOSIS — B373 Candidiasis of vulva and vagina: Secondary | ICD-10-CM

## 2020-05-04 DIAGNOSIS — R9431 Abnormal electrocardiogram [ECG] [EKG]: Secondary | ICD-10-CM | POA: Diagnosis not present

## 2020-05-04 DIAGNOSIS — B3731 Acute candidiasis of vulva and vagina: Secondary | ICD-10-CM

## 2020-05-04 DIAGNOSIS — Z1211 Encounter for screening for malignant neoplasm of colon: Secondary | ICD-10-CM

## 2020-05-04 DIAGNOSIS — E785 Hyperlipidemia, unspecified: Secondary | ICD-10-CM

## 2020-05-04 DIAGNOSIS — E1169 Type 2 diabetes mellitus with other specified complication: Secondary | ICD-10-CM

## 2020-05-04 DIAGNOSIS — J4540 Moderate persistent asthma, uncomplicated: Secondary | ICD-10-CM

## 2020-05-04 LAB — POCT GLYCOSYLATED HEMOGLOBIN (HGB A1C): Hemoglobin A1C: 8.6 % — AB (ref 4.0–5.6)

## 2020-05-04 MED ORDER — TRELEGY ELLIPTA 100-62.5-25 MCG/INH IN AEPB: 1.0000 | INHALATION_SPRAY | Freq: Every day | RESPIRATORY_TRACT | 2 refills | Status: DC

## 2020-05-04 MED ORDER — RYBELSUS 7 MG PO TABS: 1.0000 | ORAL_TABLET | Freq: Every day | ORAL | 2 refills | Status: DC

## 2020-05-04 MED ORDER — ROSUVASTATIN CALCIUM 40 MG PO CPSP: 1.0000 | ORAL_CAPSULE | Freq: Every day | ORAL | 1 refills | Status: DC

## 2020-05-04 MED ORDER — XIGDUO XR 10-500 MG PO TB24: 1.0000 | ORAL_TABLET | Freq: Every day | ORAL | 0 refills | Status: DC

## 2020-05-04 MED ORDER — FLUCONAZOLE 150 MG PO TABS: 150.0000 mg | ORAL_TABLET | ORAL | 0 refills | Status: DC

## 2020-05-04 NOTE — Telephone Encounter (Signed)
Notes to clinic:  Alternative Requested:WRITTEN FOR CAPSULES AND THAT IS NOT COVERED. COULD YOU SWITCH TO TABLETS   Requested Prescriptions  Pending Prescriptions Disp Refills   EZALLOR SPRINKLE 40 MG CPSP [Pharmacy Med Name: EZALLOR SPRINKLE 40 MG CAPSULE] 90 capsule 1    Sig: TAKE 1 CAPSULE BY MOUTH EVERY DAY      Off-Protocol Failed - 05/04/2020  3:27 PM      Failed - Medication not assigned to a protocol, review manually.      Passed - Valid encounter within last 12 months    Recent Outpatient Visits           Today Type 2 diabetes, controlled, with peripheral neuropathy [facility])   [facility], Drue Stager, MD   3 months ago Type 2 diabetes, controlled, with peripheral neuropathy [facility])   [facility] Steele Sizer, MD   3 months ago Benign essential HTN   [facility] Steele Sizer, MD   11 months ago Acute foot pain, right   [facility] Lebron Conners D, MD   1 year ago Type 2 diabetes, controlled, with peripheral neuropathy [facility])   [facility] Steele Sizer, MD       Future Appointments             In 1 month Ancil Boozer, Drue Stager, MD [place] 7 MG TABS [Pharmacy Med Name: RYBELSUS 7 MG TABLET] 30 tablet 2    Sig: TAKE 1 TABLET BY MOUTH EVERY DAY      Off-Protocol Failed - 05/04/2020  3:27 PM      Failed - Medication not assigned to a protocol, review manually.      Passed - Valid encounter within last 12 months    Recent Outpatient Visits           Today Type 2 diabetes, controlled, with peripheral neuropathy [facility])   [facility] [place], Drue Stager, MD   3 months ago Type 2 diabetes, controlled, with peripheral neuropathy [facility])   [facility] Steele Sizer, MD   3 months ago Benign essential HTN   [facility] Steele Sizer, MD   11 months ago  Acute foot pain, right   [facility] Lebron Conners D, MD   1 year ago Type 2 diabetes, controlled, with peripheral neuropathy [facility])   [facility] Steele Sizer, MD       Future Appointments             In 1 month Sowles, Drue Stager, MD [facility] [place] XR 10-500 MG TB24 [Pharmacy Med Name: XIGDUO XR 10 MG-500 MG TABLET] 90 tablet 0    Sig: TAKE 1 TABLET BY [place]      Endocrinology:  Diabetes - Biguanide + SGLT2 Inhibitor Combos Failed - 05/04/2020  3:27 PM      Failed - LDL in normal range and within 360 days    LDL Cholesterol (Calc)  Date Value Ref Range Status  02/03/2020 117 (H) mg/dL (calc) Final    Comment:    Reference range: <100 . Desirable range <100 mg/dL for primary prevention;   <70 mg/dL for patients with CHD or diabetic patients  with > or = 2 CHD risk factors. Marland Kitchen  LDL-C is now calculated using the Martin-Hopkins  calculation, which is a validated novel method providing  better accuracy than the Friedewald equation in the  estimation of LDL-C.  Cresenciano Genre et al. Annamaria Helling. 3353;317(40): 2061-2068  (http://education.[employer].com/faq/FAQ164)           Failed - HBA1C is between 0 and 7.9 and within 180 days    Hemoglobin A1C  Date Value Ref Range Status  05/04/2020 8.6 (A) 4.0 - 5.6 % Final   HbA1c, POC (controlled diabetic range)  Date Value Ref Range Status  04/25/2018 6.6 0.0 - 7.0 % Final          Passed - Cr in normal range and within 360 days    Creat  Date Value Ref Range Status  02/03/2020 0.67 0.50 - 1.10 mg/dL Final   Creatinine, Urine  Date Value Ref Range Status  02/03/2020 119 20 - 275 mg/dL Final          Passed - eGFR in normal range and within 360 days    GFR, Est African American  Date Value Ref Range Status  02/03/2020 121 > OR = 60 mL/min/1.54m Final   GFR, Est Non African American  Date Value Ref Range Status   02/03/2020 105 > OR = 60 mL/min/1.722mFinal          Passed - Valid encounter within last 6 months    Recent Outpatient Visits           [facility], [facility]   3 months [facility], [facility], [facility]  [facility]   3 months ago Benign essential HTN   [facility]   11 months ago Acute foot pain, right   [facility]   1 year ago Type 2 diabetes, controlled, with peripheral neuropathy ([facility]  [facility]       Future Appointments             In 1 month SoAncil BoozerKrDrue StagerMD [facility]

## 2020-05-05 ENCOUNTER — Ambulatory Visit: Payer: Self-pay | Admitting: *Deleted

## 2020-05-05 ENCOUNTER — Emergency Department
Admission: EM | Admit: 2020-05-05 | Discharge: 2020-05-05 | Disposition: A | Discharge status: Home / Self Care | Payer: 59 | Attending: Emergency Medicine | Admitting: Emergency Medicine

## 2020-05-05 ENCOUNTER — Encounter: Payer: Self-pay | Admitting: Family Medicine

## 2020-05-05 ENCOUNTER — Other Ambulatory Visit: Payer: Self-pay

## 2020-05-05 DIAGNOSIS — I10 Essential (primary) hypertension: Secondary | ICD-10-CM | POA: Diagnosis not present

## 2020-05-05 DIAGNOSIS — E1140 Type 2 diabetes mellitus with diabetic neuropathy, unspecified: Secondary | ICD-10-CM | POA: Insufficient documentation

## 2020-05-05 DIAGNOSIS — J45909 Unspecified asthma, uncomplicated: Secondary | ICD-10-CM | POA: Diagnosis not present

## 2020-05-05 DIAGNOSIS — Z79899 Other long term (current) drug therapy: Secondary | ICD-10-CM | POA: Diagnosis not present

## 2020-05-05 DIAGNOSIS — Z7984 Long term (current) use of oral hypoglycemic drugs: Secondary | ICD-10-CM | POA: Insufficient documentation

## 2020-05-05 DIAGNOSIS — Z7982 Long term (current) use of aspirin: Secondary | ICD-10-CM | POA: Insufficient documentation

## 2020-05-05 DIAGNOSIS — E1129 Type 2 diabetes mellitus with other diabetic kidney complication: Secondary | ICD-10-CM | POA: Diagnosis not present

## 2020-05-05 DIAGNOSIS — Z7951 Long term (current) use of inhaled steroids: Secondary | ICD-10-CM | POA: Diagnosis not present

## 2020-05-05 DIAGNOSIS — T7840XA Allergy, unspecified, initial encounter: Secondary | ICD-10-CM | POA: Insufficient documentation

## 2020-05-05 DIAGNOSIS — Z87891 Personal history of nicotine dependence: Secondary | ICD-10-CM | POA: Insufficient documentation

## 2020-05-05 DIAGNOSIS — K047 Periapical abscess without sinus: Secondary | ICD-10-CM | POA: Insufficient documentation

## 2020-05-05 MED ORDER — FAMOTIDINE 20 MG PO TABS
20.0000 mg | ORAL_TABLET | Freq: Once | ORAL | Status: AC
  Administered 2020-05-05: 20 mg via ORAL
  Filled 2020-05-05: qty 1

## 2020-05-05 MED ORDER — CLINDAMYCIN HCL 150 MG PO CAPS: 300.0000 mg | ORAL_CAPSULE | Freq: Three times a day (TID) | ORAL | 0 refills | Status: DC

## 2020-05-05 MED ORDER — METHYLPREDNISOLONE 4 MG PO TBPK: ORAL_TABLET | ORAL | 0 refills | Status: DC

## 2020-05-05 MED ORDER — DEXAMETHASONE SODIUM PHOSPHATE 10 MG/ML IJ SOLN
10.0000 mg | Freq: Once | INTRAMUSCULAR | Status: AC
  Administered 2020-05-05: 10 mg via INTRAMUSCULAR
  Filled 2020-05-05: qty 1

## 2020-05-05 NOTE — Discharge Instructions (Signed)
Follow-up with your regular doctor if not improving to 3 days.  Return emergency department worsening.  Use medications as prescribed. Buy over-the-counter align to help with yeast Buy over-the-counter Monistat to help with these

## 2020-05-05 NOTE — ED Provider Notes (Signed)
[facility] Emergency Department Provider Note  ____________________________________________   Event Date/Time   First MD Initiated Contact with Patient 05/05/20 [phone]     (approximate)  I have reviewed the triage vital signs and the nursing notes.   HISTORY  Chief Complaint Allergic Reaction    HPI Kelsey Alvarez is a 50 y.o. female presents emergency department with concerns of allergic reaction to Diflucan.  Patient states that she has diabetes and has problems with yeast.  Saw her regular doctor yesterday who upped her diabetes medicine and gave her Diflucan for the yeast.  States her face began to swell 2 hours after taking the medication.  No difficulty breathing.  No tongue swelling.  Patient is also concerned if it could be a tooth infection versus allergic reaction.  Patient has been on and off of prednisone frequently due to Covid infection in January.    Past Medical History:  Diagnosis Date  . Abnormal CBC   . Allergy   . Arthritis    left knee  . Asthma   . Cervical polyp   . Chronic foot pain   . Complication of anesthesia    takes longer to wake up.  . Contact dermatitis   . Diabetes mellitus without complication ([place])   . Dysmenorrhea   . Edema   . 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 Fibroid uterus    left side, managed by Dr. Glennon Mac @ [facility]  . GERD (gastroesophageal reflux disease)   . Headache    history of migraines  . Hyperlipidemia   . Hypertension   . Lumbago   . Neuropathy   . Obesity   . Occasional tremors     Patient Active Problem List   Diagnosis Date Noted  . Type 2 diabetes, controlled, with peripheral neuropathy ([place]) 01/20/2018  . Endometriosis 11/06/2017  . Menorrhagia with irregular cycle 10/28/2017  . Fibroid uterus 10/28/2017  . Carpal tunnel syndrome on both sides 10/17/2017  . B12 deficiency 04/27/2016  . History of endometriosis 11/17/2015  . Oligomenorrhea 10/12/2015  . Benign essential  HTN 12/28/2014  . Cervical polyp 12/28/2014  . Controlled type 2 diabetes mellitus with microalbuminuria ([place]) 12/28/2014  . Dyslipidemia 12/28/2014  . Dysmenorrhea 12/28/2014  . Edema 12/28/2014  . Female infertility 12/28/2014  . Gastro-esophageal reflux disease without esophagitis 12/28/2014  . Morbid obesity ([place]) 12/28/2014  . Asthma, moderate persistent, poorly-controlled 12/28/2014  . Perennial allergic rhinitis 12/28/2014  . Intermittent tremor 12/28/2014  . Vitamin D deficiency 12/28/2014  . Intermittent low back pain 12/28/2014  . Left sciatic nerve pain 12/28/2014    Past Surgical History:  Procedure Laterality Date  . APPENDECTOMY    . cervical polyp removal    . DILATION AND CURETTAGE OF UTERUS    . HYSTEROSCOPY    . HYSTEROSCOPY WITH D & C N/A 10/12/2015   Procedure: DILATATION AND CURETTAGE /HYSTEROSCOPY;  Surgeon: Will Bonnet, MD;  Location: [facility] ORS;  Service: Gynecology;  Laterality: N/A;  . LAPAROSCOPIC BILATERAL SALPINGO OOPHERECTOMY N/A 10/12/2015   Procedure: LAPAROSCOPIC RIGHT SALPINGECTOMY, LEFT OOPHORECTOMY;  Surgeon: Will Bonnet, MD;  Location: [facility] ORS;  Service: Gynecology;  Laterality: N/A;  . LAPAROSCOPIC OVARIAN CYSTECTOMY Bilateral 10/12/2015   Procedure: LAPAROSCOPIC OVARIAN CYSTECTOMY;  Surgeon: Will Bonnet, MD;  Location: [facility] ORS;  Service: Gynecology;  Laterality: Bilateral;  . OOPHORECTOMY    . OVARIAN CYST REMOVAL      Prior to Admission medications   Medication Sig Start Date  End Date Taking? Authorizing Provider  clindamycin (CLEOCIN) 150 MG capsule Take 2 capsules (300 mg total) by mouth 3 (three) times daily. 05/05/20  Yes Fisher, Linden Dolin, PA-C  methylPREDNISolone (MEDROL DOSEPAK) 4 MG TBPK tablet Take 6 pills on day one then decrease by 1 pill each day 05/05/20  Yes Fisher, Linden Dolin, PA-C  albuterol (VENTOLIN HFA) 108 (90 Base) MCG/ACT inhaler Inhale 2 puffs into the lungs every 6 (six) hours as needed for wheezing or shortness  of breath. 04/10/19   Steele Sizer, MD  Alpha Lipoic Acid 200 MG CAPS Take by mouth.    [provider]  aspirin EC 81 MG tablet Take 1 tablet (81 mg total) by mouth daily. 10/09/16   Steele Sizer, MD  Cetirizine HCl (ZYRTEC PO) Take 10 mg by mouth daily.    [provider]  Cholecalciferol (VITAMIN D) 2000 UNITS tablet Take 1 tablet by mouth daily. 08/23/10   [provider]  Dapagliflozin-metFORMIN HCl ER (XIGDUO XR) 10-500 MG TB24 Take 1 tablet by mouth daily. 05/04/20   Steele Sizer, MD  ergocalciferol (VITAMIN D2) 1.25 MG (50000 UT) capsule Take 1 capsule by mouth once a week. 09/13/17   [provider]  fluconazole (DIFLUCAN) 150 MG tablet Take 1 tablet (150 mg total) by mouth every other day. Prn 05/04/20   Steele Sizer, MD  fluticasone (FLONASE) 50 MCG/ACT nasal spray Place 2 sprays into both nostrils daily. 10/17/17   Steele Sizer, MD  Fluticasone-Umeclidin-Vilant (TRELEGY ELLIPTA) 100-62.5-25 MCG/INH AEPB Inhale 1 puff into the lungs daily. 05/04/20   Steele Sizer, MD  gabapentin (NEURONTIN) 300 MG capsule Take 1 capsule (300 mg total) by mouth 3 (three) times daily. Patient taking differently: Take 300 mg by mouth at bedtime.  06/24/17 05/12/19  Cuthriell, Charline Bills, PA-C  ipratropium-albuterol (DUONEB) 0.5-2.5 (3) MG/3ML SOLN Take 3 mLs by nebulization every 4 (four) hours as needed. 01/20/18   Steele Sizer, MD  meloxicam (MOBIC) 15 MG tablet Take 1 tablet (15 mg total) by mouth daily. 02/03/20   Steele Sizer, MD  montelukast (SINGULAIR) 10 MG tablet Take 1 tablet (10 mg total) by mouth at bedtime. 02/03/20   Steele Sizer, MD  olmesartan-hydrochlorothiazide (BENICAR HCT) 20-12.5 MG tablet Take 1 tablet by mouth daily. 02/03/20   Steele Sizer, MD  omeprazole (PRILOSEC) 20 MG capsule Take 1 capsule (20 mg total) by mouth daily. 02/03/20   Steele Sizer, MD  potassium chloride SA (KLOR-CON) 20 MEQ tablet Take 20 mEq by mouth 2 (two) times  daily. 04/01/20   [provider]  Rosuvastatin Calcium 40 MG CPSP Take 1 tablet by mouth daily. 05/04/20   Steele Sizer, MD  Semaglutide (RYBELSUS) 7 MG TABS Take 1 tablet by mouth daily. 05/04/20   Steele Sizer, MD    Allergies Fish allergy, Penicillins, Levofloxacin, and Adhesive [tape]  Family History  Problem Relation Age of Onset  . Hypertension Mother   . Diabetes Mother   . CAD Mother   . Cancer Mother        cervical  . Heart attack Mother 70       Triple Bypass  . CAD Father   . Hypertension Father   . Arthritis Father   . Thyroid disease Sister   . Alzheimer's disease Maternal Grandmother   . Alzheimer's disease Maternal Grandfather   . Breast cancer Paternal Grandmother     Social History Social History   Tobacco Use  . Smoking status: Former Smoker    Years:  1.00    Types: Cigarettes    Start date: 12/03/2001    Quit date: 12/28/2002    Years since quitting: 17.3  . Smokeless tobacco: Never Used  . Tobacco comment: smoked 3 to 4 cigarrettes a day for a year  Vaping Use  . Vaping Use: Never used  Substance Use Topics  . Alcohol use: Yes    Alcohol/week: 0.0 standard drinks    Comment: occasionally drinks margarita, once or twice a year  . Drug use: No    Review of Systems  Constitutional: No fever/chills Eyes: No visual changes. ENT: No sore throat. Respiratory: Denies cough Cardiovascular: Denies chest pain Gastrointestinal: Denies abdominal pain Genitourinary: Negative for dysuria. Musculoskeletal: Negative for back pain. Skin: Negative for rash. Psychiatric: no mood changes,     ____________________________________________   PHYSICAL EXAM:  VITAL SIGNS: ED Triage Vitals  Enc Vitals Group     BP 05/05/20 0806 (!) 147/63     Pulse Rate 05/05/20 0806 83     Resp 05/05/20 0806 14     Temp --      Temp src --      SpO2 05/05/20 0806 97 %     Weight 05/05/20 0815 271 lb 2.7 oz (123 kg)     Height 05/05/20 0815 5\' 2"  (1.575  m)     Head Circumference --      Peak Flow --      Pain Score 05/05/20 0801 0     Pain Loc --      Pain Edu? --      Excl. in [place]? --     Constitutional: Alert and oriented. Well appearing and in no acute distress. Eyes: Conjunctivae are normal.  Head: Atraumatic.  Positive left-sided facial swelling with mild swelling on the right Nose: No congestion/rhinnorhea. Mouth/Throat: Mucous membranes are moist.  Airway is patent Neck:  supple no lymphadenopathy noted Cardiovascular: Normal rate, regular rhythm. Heart sounds are normal Respiratory: Normal respiratory effort.  No retractions, lungs c t a, no wheezing GU: deferred Musculoskeletal: FROM all extremities, warm and well perfused Neurologic:  Normal speech and language.  Skin:  Skin is warm, dry and intact. No rash noted. Psychiatric: Mood and affect are normal. Speech and behavior are normal.  ____________________________________________   LABS (all labs ordered are listed, but only abnormal results are displayed)  Labs Reviewed - No data to display ____________________________________________   ____________________________________________  RADIOLOGY    ____________________________________________   PROCEDURES  Procedure(s) performed: No  Procedures    ____________________________________________   INITIAL IMPRESSION / ASSESSMENT AND PLAN / ED COURSE  Pertinent labs & imaging results that were available during my care of the patient were reviewed by me and considered in my medical decision making (see chart for details).   Patient is a 50 year old female presents with allergic reaction to Diflucan.  See HPI.  Physical exam shows patient to appear stable at this time.  DDx: Allergic reaction to fluconazole, dental abscess, allergic reaction to cross-contamination of food with strep  Patient was given Decadron 10 mg IM and Pepcid 20 mg p.o.  She will be given a prescription for Medrol Dosepak and  clindamycin.  There is a hardened area on the left side of the face which could be a dental abscess.  Told patient to wait 24 hours to see if the swelling has decreased with the Decadron and methylprednisone.  If not start antibiotic.  Also encouraged her to start taking align to help with yeast.  She should take this daily.  Try over-the-counter Monistat for yeast.     Kelsey Alvarez was evaluated in Emergency Department on 05/05/2020 for the symptoms described in the history of present illness. She was evaluated in the context of the global COVID-19 pandemic, which necessitated consideration that the patient might be at risk for infection with the SARS-CoV-2 virus that causes COVID-19. Institutional protocols and algorithms that pertain to the evaluation of patients at risk for COVID-19 are in a state of rapid change based on information released by regulatory bodies including the CDC and federal and state organizations. These policies and algorithms were followed during the patient's care in the ED.    As part of my medical decision making, I reviewed the following data within the [facility] History obtained from family, Nursing notes reviewed and incorporated, Old chart reviewed, Notes from prior ED visits and [place] Controlled Substance Database  ____________________________________________   FINAL CLINICAL IMPRESSION(S) / ED DIAGNOSES  Final diagnoses:  Allergic reaction, initial encounter  Dental abscess      NEW MEDICATIONS STARTED DURING THIS VISIT:  New Prescriptions   CLINDAMYCIN (CLEOCIN) 150 MG CAPSULE    Take 2 capsules (300 mg total) by mouth 3 (three) times daily.   METHYLPREDNISOLONE (MEDROL DOSEPAK) 4 MG TBPK TABLET    Take 6 pills on day one then decrease by 1 pill each day     Note:  This document was prepared using Dragon voice recognition software and may include unintentional dictation errors.    Versie Starks, PA-C 05/05/20 3361    Nance Pear, MD 05/05/20 1125

## 2020-05-05 NOTE — ED Notes (Signed)
See triage note  Presents with some facial swelling  States she noticed sx's after taking fluconazole   No resp issues noted at present

## 2020-05-05 NOTE — Telephone Encounter (Signed)
Pt called in c/o left facial swelling around left eye, nose and lips.  "A lot of swelling under my left eye and my lips:\". She took a dose of Fluconazole last night for a yeast infection that was prescribed by Dr. Ancil Boozer.  Shortly after that she began to have swelling.   This morning it's worse.  Denies shortness of breath, chest pain, difficulty swallowing or feeling like her throat is swelling closed.  She has taken this medication before without a problem.  I let her know you can still react to substances after being exposed to them.  I have referred her to the ED per the protocol.  Her husband is going to take her to [facility] now.  I sent my notes to [facility] for Dr. Ancil Boozer' information when the office opens at 8:00.   Reason for Disposition . Taking an ACE Inhibitor medication  (e.g., benazepril/LOTENSIN, captopril/CAPOTEN, enalapril/VASOTEC, lisinopril/ZESTRIL)    Took Fluconazole last night and swelling started shortly after that.  Answer Assessment - Initial Assessment Questions 1. ONSET: "When did the swelling start?" (e.g., minutes, hours, days)     Has facial swelling around eye, lip and nose.   Last night started.   Left side. 2. LOCATION: "What part of the face is swollen?"     Left side I took a pill for yeast infection last night.   Flucanazole 3. SEVERITY: "How swollen is it?"     It's pretty swollen around lips and under left eye.   4. ITCHING: "Is there any itching?" If Yes, ask: "How much?"   (Scale 1-10; mild, moderate or severe)     Tender and hot to touch 5. PAIN: "Is the swelling painful to touch?" If Yes, ask: "How painful is it?"   (Scale 1-10; mild, moderate or severe)     No pain.    6. FEVER: "Do you have a fever?" If Yes, ask: "What is it, how was it measured, and when did it start?"      No 7. CAUSE: "What do you think is causing the face swelling?"     I thought it was a sinus problem or I also have bad  teeth I've taken this medication before and I haven't had a problem with it. 8. RECURRENT SYMPTOM: "Have you had face swelling before?" If Yes, ask: "When was the last time?" "What happened that time?"     No 9. OTHER SYMPTOMS: "Do you have any other symptoms?" (e.g., toothache, leg swelling)     Denies shortness of breath or chest pain.   No trouble with swallow or breathing. 10. PREGNANCY: "Is there any chance you are pregnant?" "When was your last menstrual period?"       Not asked  Protocols used: [facility]

## 2020-05-05 NOTE — ED Triage Notes (Signed)
Pt started taking fluconazole for a yeast infection yesterday and started noticing swelling of her face, states it was worse this morning, denies any rash , throat or tongue swelling.

## 2020-05-05 NOTE — Telephone Encounter (Signed)
Ok will try PA's

## 2020-05-06 ENCOUNTER — Other Ambulatory Visit: Payer: Self-pay | Admitting: Family Medicine

## 2020-05-06 MED ORDER — SYNJARDY XR 25-1000 MG PO TB24: 1.0000 | ORAL_TABLET | Freq: Every day | ORAL | 2 refills | Status: DC

## 2020-05-06 NOTE — Telephone Encounter (Signed)
In Brief: Kelsey Alvarez - A New Formulation of Rosuvastatin  [employer] The lipid-lowering drug rosuvastatin is now available in a Alvarez capsule formulation

## 2020-05-06 NOTE — Telephone Encounter (Signed)
FYI if ins does not pay for Xigduo try syndjardy and if ins. Does not cover rybelsus try gluccophage

## 2020-05-19 ENCOUNTER — Encounter: Payer: Self-pay | Admitting: Cardiology

## 2020-05-19 ENCOUNTER — Ambulatory Visit (INDEPENDENT_AMBULATORY_CARE_PROVIDER_SITE_OTHER): Payer: 59

## 2020-05-19 ENCOUNTER — Ambulatory Visit (INDEPENDENT_AMBULATORY_CARE_PROVIDER_SITE_OTHER): Payer: 59 | Admitting: Cardiology

## 2020-05-19 ENCOUNTER — Other Ambulatory Visit: Payer: Self-pay

## 2020-05-19 VITALS — BP 136/80 | HR 89 | Ht 62.0 in | Wt 272.0 lb

## 2020-05-19 DIAGNOSIS — R002 Palpitations: Secondary | ICD-10-CM | POA: Diagnosis not present

## 2020-05-19 DIAGNOSIS — I10 Essential (primary) hypertension: Secondary | ICD-10-CM

## 2020-05-19 DIAGNOSIS — E7800 Pure hypercholesterolemia, unspecified: Secondary | ICD-10-CM | POA: Diagnosis not present

## 2020-05-19 NOTE — Patient Instructions (Signed)
Medication Instructions:  Your physician recommends that you continue on your current medications as directed. Please refer to the Current Medication list given to you today.  *If you need a refill on your cardiac medications before your next appointment, please call your pharmacy*   Lab Work: None ordered If you have labs (blood work) drawn today and your tests are completely normal, you will receive your results only by: . MyChart Message (if you have MyChart) OR . A paper copy in the mail If you have any lab test that is abnormal or we need to change your treatment, we will call you to review the results.   Testing/Procedures:  Your physician has recommended that you wear a Zio monitor for 2 weeks.   This monitor is a medical device that records the heart's electrical activity. Doctors most often use these monitors to diagnose arrhythmias. Arrhythmias are problems with the speed or rhythm of the heartbeat. The monitor is a small device applied to your chest. You can wear one while you do your normal daily activities. While wearing this monitor if you have any symptoms to push the button and record what you felt. Once you have worn this monitor for the period of time provider prescribed (Usually 14 days), you will return the monitor device in the postage paid box. Once it is returned they will download the data collected and provide us with a report which the provider will then review and we will call you with those results. Important tips:  1. Avoid showering during the first 24 hours of wearing the monitor. 2. Avoid excessive sweating to help maximize wear time. 3. Do not submerge the device, no hot tubs, and no swimming pools. 4. Keep any lotions or oils away from the patch. 5. After 24 hours you may shower with the patch on. Take brief showers with your back facing the shower head.  6. Do not remove patch once it has been placed because that will interrupt data and decrease adhesive  wear time. 7. Push the button when you have any symptoms and write down what you were feeling. 8. Once you have completed wearing your monitor, remove and place into box which has postage paid and place in your outgoing mailbox.  9. If for some reason you have misplaced your box then call our office and we can provide another box and/or mail it off for you.      Follow-Up: At [facility], you and your health needs are our priority.  As part of our continuing mission to provide you with exceptional heart care, we have created designated Provider Care Teams.  These Care Teams include your primary Cardiologist (physician) and Advanced Practice Providers (APPs -  Physician Assistants and Nurse Practitioners) who all work together to provide you with the care you need, when you need it.  We recommend signing up for the patient portal called "MyChart".  Sign up information is provided on this After Visit Summary.  MyChart is used to connect with patients for Virtual Visits (Telemedicine).  Patients are able to view lab/test results, encounter notes, upcoming appointments, etc.  Non-urgent messages can be sent to your provider as well.   To learn more about what you can do with MyChart, go to [email or web address].    Your next appointment:   6 week(s)  The format for your next appointment:   In Person  Provider:   Brian Agbor-Etang, MD   Other Instructions   

## 2020-05-19 NOTE — Progress Notes (Signed)
Cardiology Office Note:    Date:  05/19/2020   ID:  [name], DOB Jul 26, 1970, MRN [id number]  PCP:  Steele Sizer, [place]  Cardiologist:  Kate Sable, MD  Advanced Practice Provider:  No care team member to display Electrophysiologist:  None       Referring MD: Steele Sizer, MD   Chief Complaint  Patient presents with  . New Patient (Initial Visit)    Referred by PCP for abnormal EKG   Kelsey Alvarez is a 50 y.o. female who is being seen today for the evaluation of abnormal EKG at the request of Steele Sizer, MD.   History of Present Illness:    Kelsey Alvarez is a 50 y.o. female with a hx of hypertension, hyperlipidemia, obesity who presents due to abnormal ECG.  Patient was seen at [facility] urgent care end of January 2022 after developing COVID-19 symptoms.  She was told her EKG was abnormal.  EKG obtained at the time 04/01/2020 showed sinus rhythm with PAC.  Patient was concerned with abnormal EKG due to history of coronary artery bypass in her mother around age 59.  She denies chest pain, but states having occasional palpitations roughly 1-2 times biweekly.  Symptoms are not associated with dizziness although she states having history of vertigo.  Past Medical History:  Diagnosis Date  . Abnormal CBC   . Allergy   . Arthritis    left knee  . Asthma   . Cervical polyp   . Chronic foot pain   . Complication of anesthesia    takes longer to wake up.  . Contact dermatitis   . Diabetes mellitus without complication ([place])   . Dysmenorrhea   . Edema   . 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 Fibroid uterus    left side, managed by Dr. Glennon Mac @ [facility]  . GERD (gastroesophageal reflux disease)   . Headache    history of migraines  . Hyperlipidemia   . Hypertension   . Lumbago   . Neuropathy   . Obesity   . Occasional tremors     Past Surgical History:  Procedure Laterality Date  . APPENDECTOMY    . cervical  polyp removal    . DILATION AND CURETTAGE OF UTERUS    . HYSTEROSCOPY    . HYSTEROSCOPY WITH D & C N/A 10/12/2015   Procedure: DILATATION AND CURETTAGE /HYSTEROSCOPY;  Surgeon: Will Bonnet, MD;  Location: [facility] ORS;  Service: Gynecology;  Laterality: N/A;  . LAPAROSCOPIC BILATERAL SALPINGO OOPHERECTOMY N/A 10/12/2015   Procedure: LAPAROSCOPIC RIGHT SALPINGECTOMY, LEFT OOPHORECTOMY;  Surgeon: Will Bonnet, MD;  Location: [facility] ORS;  Service: Gynecology;  Laterality: N/A;  . LAPAROSCOPIC OVARIAN CYSTECTOMY Bilateral 10/12/2015   Procedure: LAPAROSCOPIC OVARIAN CYSTECTOMY;  Surgeon: Will Bonnet, MD;  Location: [facility] ORS;  Service: Gynecology;  Laterality: Bilateral;  . OOPHORECTOMY    . OVARIAN CYST REMOVAL      Current Medications: Current Meds  Medication Sig  . albuterol (VENTOLIN HFA) 108 (90 Base) MCG/ACT inhaler Inhale 2 puffs into the lungs every 6 (six) hours as needed for wheezing or shortness of breath.  Ilean Skill Lipoic Acid 200 MG CAPS Take by mouth.  Marland Kitchen aspirin EC 81 MG tablet Take 1 tablet (81 mg total) by mouth daily.  . Cetirizine HCl (ZYRTEC PO) Take 10 mg by mouth daily.  . Cholecalciferol (VITAMIN D) 2000 UNITS tablet Take 1 tablet by mouth daily.  . ergocalciferol (VITAMIN D2)  1.25 MG (50000 UT) capsule Take 1 capsule by mouth once a week.  . fluconazole (DIFLUCAN) 150 MG tablet Take 1 tablet (150 mg total) by mouth every other day. Prn  . fluticasone (FLONASE) 50 MCG/ACT nasal spray Place 2 sprays into both nostrils daily.  . Fluticasone-Umeclidin-Vilant (TRELEGY ELLIPTA) 100-62.5-25 MCG/INH AEPB Inhale 1 puff into the lungs daily.  Marland Kitchen gabapentin (NEURONTIN) 300 MG capsule Take 300 mg by mouth at bedtime.  Marland Kitchen ipratropium-albuterol (DUONEB) 0.5-2.5 (3) MG/3ML SOLN Take 3 mLs by nebulization every 4 (four) hours as needed.  . meloxicam (MOBIC) 15 MG tablet Take 1 tablet (15 mg total) by mouth daily.  . montelukast (SINGULAIR) 10 MG tablet Take 1 tablet (10 mg total) by  mouth at bedtime.  Marland Kitchen olmesartan-hydrochlorothiazide (BENICAR HCT) 20-12.5 MG tablet Take 1 tablet by mouth daily.  Marland Kitchen omeprazole (PRILOSEC) 20 MG capsule Take 1 capsule (20 mg total) by mouth daily.  . potassium chloride SA (KLOR-CON) 20 MEQ tablet Take 20 mEq by mouth 2 (two) times daily.  . Semaglutide (RYBELSUS) 7 MG TABS Take 1 tablet by mouth daily.     Allergies:   Fish allergy, Penicillins, Levofloxacin, and Adhesive [tape]   Social History   Socioeconomic History  . Marital status: Married    Spouse name: Kasandra Knudsen  . Number of children: 0  . Years of education: Not on file  . Highest education level: Some college, no degree  Occupational History  . Occupation: Museum/gallery curator  Tobacco Use  . Smoking status: Former Smoker    Years: 1.00    Types: Cigarettes    Start date: 12/03/2001    Quit date: 12/28/2002    Years since quitting: 17.4  . Smokeless tobacco: Never Used  . Tobacco comment: smoked 3 to 4 cigarrettes a day for a year  Vaping Use  . Vaping Use: Never used  Substance and Sexual Activity  . Alcohol use: Yes    Alcohol/week: 0.0 standard drinks    Comment: occasionally drinks margarita, once or twice a year  . Drug use: No  . Sexual activity: Yes    Partners: Male    Comment: Partial Hysterectomy  Other Topics Concern  . Not on file  Social History Narrative  . Not on file   Social Determinants of Health   Financial Resource Strain: Not on file  Food Insecurity: Not on file  Transportation Needs: Not on file  Physical Activity: Not on file  Stress: Not on file  Social Connections: Not on file     Family History: The patient's family history includes Alzheimer's disease in her maternal grandfather and maternal grandmother; Arthritis in her father; Breast cancer in her paternal grandmother; CAD in her father and mother; Cancer in her mother; Diabetes in her mother; Heart attack (age of onset: 41) in her mother; Hypertension in her father and mother; Thyroid  disease in her sister.  ROS:   Please see the history of present illness.     All other systems reviewed and are negative.  EKGs/Labs/Other Studies Reviewed:    The following studies were reviewed today:   EKG:  EKG is  ordered today.  The ekg ordered today demonstrates sinus rhythm, nonspecific T wave changes  Recent Labs: 02/03/2020: ALT 33; BUN 5; Creat 0.67; Hemoglobin 14.3; Platelets 272; Potassium 3.7; Sodium 139; TSH 1.12  Recent Lipid Panel    Component Value Date/Time   CHOL 182 02/03/2020 1138   TRIG 159 (H) 02/03/2020 1138   HDL 38 (  L) 02/03/2020 1138   CHOLHDL 4.8 02/03/2020 1138   VLDL 23 10/09/2016 1200   LDLCALC 117 (H) 02/03/2020 1138     Risk Assessment/Calculations:      Physical Exam:    VS:  BP 136/80   Pulse 89   Ht 5\' 2"  (1.575 m)   Wt 272 lb (123.4 kg)   LMP 04/21/2019   BMI 49.75 kg/m     Wt Readings from Last 3 Encounters:  05/19/20 272 lb (123.4 kg)  05/05/20 271 lb 2.7 oz (123 kg)  05/04/20 272 lb 6.4 oz (123.6 kg)     GEN:  Well nourished, well developed in no acute distress HEENT: Normal NECK: No JVD; No carotid bruits LYMPHATICS: No lymphadenopathy CARDIAC: RRR, no murmurs, rubs, gallops RESPIRATORY: Clear anteriorly, diminished in bases ABDOMEN: Soft, non-tender, distended MUSCULOSKELETAL:  No edema; No deformity  SKIN: Warm and dry NEUROLOGIC:  Alert and oriented x 3 PSYCHIATRIC:  Normal affect   ASSESSMENT:    1. Palpitations   2. Primary hypertension   3. Pure hypercholesterolemia   4. Morbid obesity ([place])    PLAN:    In order of problems listed above:  1. Palpitations, placed 2-week cardiac monitor to evaluate any significant arrhythmias.  EKG reviewed by myself from 04/01/2020 was unremarkable, showing sinus rhythm with PACs.  Patient made aware and reassured. 2. Hypertension, BP controlled, continue current BP meds 3. Hyperlipidemia, continue intensity statin 4. Patient is morbidly obese, weight loss,  low-calorie diet advised.  Over 5 minutes spent counseling patient.  Follow-up after cardiac monitor.     Medication Adjustments/Labs and Tests Ordered: Current medicines are reviewed at length with the patient today.  Concerns regarding medicines are outlined above.  Orders Placed This Encounter  Procedures  . LONG TERM MONITOR (3-14 DAYS)  . EKG 12-Lead   No orders of the defined types were placed in this encounter.   Patient Instructions  Medication Instructions:  Your physician recommends that you continue on your current medications as directed. Please refer to the Current Medication list given to you today.  *If you need a refill on your cardiac medications before your next appointment, please call your pharmacy*   Lab Work: None ordered If you have labs (blood work) drawn today and your tests are completely normal, you will receive your results only by: Marland Kitchen MyChart Message (if you have MyChart) OR . A paper copy in the mail If you have any lab test that is abnormal or we need to change your treatment, we will call you to review the results.   Testing/Procedures:  Your physician has recommended that you wear a Zio monitor for 2 weeks. This monitor is a medical device that records the heart's electrical activity. Doctors most often use these monitors to diagnose arrhythmias. Arrhythmias are problems with the speed or rhythm of the heartbeat. The monitor is a small device applied to your chest. You can wear one while you do your normal daily activities. While wearing this monitor if you have any symptoms to push the button and record what you felt. Once you have worn this monitor for the period of time provider prescribed (Usually 14 days), you will return the monitor device in the postage paid box. Once it is returned they will download the data collected and provide [place] with a report which the provider will then review and we will call you with those results. Important  tips:  1. Avoid showering during the first 24 hours of wearing  the monitor. 2. Avoid excessive sweating to help maximize wear time. 3. Do not submerge the device, no hot tubs, and no swimming pools. 4. Keep any lotions or oils away from the patch. 5. After 24 hours you may shower with the patch on. Take brief showers with your back facing the shower head.  6. Do not remove patch once it has been placed because that will interrupt data and decrease adhesive wear time. 7. Push the button when you have any symptoms and write down what you were feeling. 8. Once you have completed wearing your monitor, remove and place into box which has postage paid and place in your outgoing mailbox.  9. If for some reason you have misplaced your box then call our office and we can provide another box and/or mail it off for you.         Follow-Up: At [facility], you and your health needs are our priority.  As part of our continuing mission to provide you with exceptional heart care, we have created designated Provider Care Teams.  These Care Teams include your primary Cardiologist (physician) and Advanced Practice Providers (APPs -  Physician Assistants and Nurse Practitioners) who all work together to provide you with the care you need, when you need it.  We recommend signing up for the patient portal called "MyChart".  Sign up information is provided on this After Visit Summary.  MyChart is used to connect with patients for Virtual Visits (Telemedicine).  Patients are able to view lab/test results, encounter notes, upcoming appointments, etc.  Non-urgent messages can be sent to your provider as well.   To learn more about what you can do with MyChart, go to [email or web address].    Your next appointment:   6 week(s)  The format for your next appointment:   In Person  Provider:   Kate Sable, MD   Other Instructions      Signed, Kate Sable, MD  05/19/2020 12:23 PM    [place]

## 2020-06-02 NOTE — Progress Notes (Signed)
Name: Kelsey Alvarez   MRN: [id number]    DOB: 12/14/1970   Date:06/03/2020       Progress Note  Subjective  Chief Complaint  Chief Complaint  Patient presents with  . Follow-up    HPI  Patient presents for annual CPE.  Diet: drinking more water, avoiding carbs lately since A1C was so high Exercise: she has been trying to be more active.   [place] Office Visit from 05/04/2020 in [facility]  AUDIT-C Score 0     Depression: Phq 9 is  negative Depression screen [facility] 2/9 06/03/2020 05/04/2020 02/03/2020 01/22/2020 05/12/2019  Decreased Interest 0 0 0 0 0  Down, Depressed, Hopeless 0 0 0 0 0  PHQ - 2 Score 0 0 0 0 0  Altered sleeping - - 2 2 0  Tired, decreased energy - - 1 1 0  Change in appetite - - 0 0 0  Feeling bad or failure about yourself  - - 0 0 0  Trouble concentrating - - 0 0 0  Moving slowly or fidgety/restless - - 0 0 0  Suicidal thoughts - - 0 0 0  PHQ-9 Score - - 3 3 0  Difficult doing work/chores - - - Not difficult at all Not difficult at all  Some recent data might be hidden   Hypertension: BP Readings from Last 3 Encounters:  06/03/20 132/88  05/19/20 136/80  05/05/20 (!) 147/63   Obesity: Wt Readings from Last 3 Encounters:  06/03/20 269 lb (122 kg)  05/19/20 272 lb (123.4 kg)  05/05/20 271 lb 2.7 oz (123 kg)   BMI Readings from Last 3 Encounters:  06/03/20 49.20 kg/m  05/19/20 49.75 kg/m  05/05/20 49.60 kg/m     Vaccines:   Pneumonia:  educated and discussed with patient. Flu:  [facility]   Hep C Screening: 2019 STD testing and prevention (HIV/chl/gon/syphilis): not interested  Intimate partner violence: negative screen  Sexual History : mild discomfort with intercourse, one sexual partner, no vaginal discharge Menstrual History/LMP/Abnormal Bleeding: personal history of fibroids, endometriosis and infertility. Cycles are irregular , oligomenorrhea under the care of Dr. Glennon Mac  Incontinence Symptoms:   Breast  cancer:  - Last Mammogram: last one was 2021  - BRCA gene screening: mother had ovarian cancer, they did not check her for genetic test. Paternal side grandmother had breast cancer and uncle colon cancer  - she is not interested in getting tested  Osteoporosis: Discussed high calcium and vitamin D supplementation, weight bearing exercises  Cervical cancer screening: she will get it done by Dr. Glennon Mac   Skin cancer: Discussed monitoring for atypical lesions  Colorectal cancer: referral has been placed she will call when ready to schedule it  Lung cancer:   Low Dose CT Chest recommended if Age 48-80 years, 43 pack-year currently smoking OR have quit w/in 15years. Patient does not qualify.   ECG: seen by cardiologist 2022   Advanced Care Planning: A voluntary discussion about advance care planning including the explanation and discussion of advance directives.  Discussed health care proxy and Living will, and the patient was able to identify a health care proxy as husband     Lipids: Lab Results  Component Value Date   CHOL 182 02/03/2020   CHOL 163 01/06/2019   CHOL 160 12/24/2017   Lab Results  Component Value Date   HDL 38 (L) 02/03/2020   HDL 39 (L) 01/06/2019   HDL 39 (L) 12/24/2017   Lab  Results  Component Value Date   LDLCALC 117 (H) 02/03/2020   LDLCALC 99 01/06/2019   LDLCALC 93 12/24/2017   Lab Results  Component Value Date   TRIG 159 (H) 02/03/2020   TRIG 158 (H) 01/06/2019   TRIG 181 (H) 12/24/2017   Lab Results  Component Value Date   CHOLHDL 4.8 02/03/2020   CHOLHDL 4.2 01/06/2019   CHOLHDL 4.1 12/24/2017   No results found for: LDLDIRECT  Glucose: Glucose  Date Value Ref Range Status  12/01/2013 100 (H) 65 - 99 mg/dL Final  09/01/2013 142 (H) 65 - 99 mg/dL Final  11/20/2012 135 (H) 65 - 99 mg/dL Final   Glucose, Bld  Date Value Ref Range Status  02/03/2020 157 (H) 65 - 99 mg/dL Final    Comment:    .            Fasting reference  interval . For someone without known diabetes, a glucose value >125 mg/dL indicates that they may have diabetes and this should be confirmed with a follow-up test. .   01/06/2019 131 (H) 65 - 99 mg/dL Final    Comment:    .            Fasting reference interval . For someone without known diabetes, a glucose value >125 mg/dL indicates that they may have diabetes and this should be confirmed with a follow-up test. .   12/24/2017 111 (H) 65 - 99 mg/dL Final    Comment:    .            Fasting reference interval . For someone without known diabetes, a glucose value between 100 and 125 mg/dL is consistent with prediabetes and should be confirmed with a follow-up test. .    Glucose-Capillary  Date Value Ref Range Status  08/18/2017 217 (H) 65 - 99 mg/dL Final  10/12/2015 105 (H) 65 - 99 mg/dL Final    Patient Active Problem List   Diagnosis Date Noted  . Type 2 diabetes, controlled, with peripheral neuropathy ([place]) 01/20/2018  . Endometriosis 11/06/2017  . Menorrhagia with irregular cycle 10/28/2017  . Fibroid uterus 10/28/2017  . Carpal tunnel syndrome on both sides 10/17/2017  . B12 deficiency 04/27/2016  . History of endometriosis 11/17/2015  . Oligomenorrhea 10/12/2015  . Benign essential HTN 12/28/2014  . Cervical polyp 12/28/2014  . Controlled type 2 diabetes mellitus with microalbuminuria ([place]) 12/28/2014  . Dyslipidemia 12/28/2014  . Dysmenorrhea 12/28/2014  . Edema 12/28/2014  . Female infertility 12/28/2014  . Gastro-esophageal reflux disease without esophagitis 12/28/2014  . Morbid obesity ([place]) 12/28/2014  . Asthma, moderate persistent, poorly-controlled 12/28/2014  . Perennial allergic rhinitis 12/28/2014  . Intermittent tremor 12/28/2014  . Vitamin D deficiency 12/28/2014  . Intermittent low back pain 12/28/2014  . Left sciatic nerve pain 12/28/2014    Past Surgical History:  Procedure Laterality Date  . APPENDECTOMY    . cervical polyp  removal    . DILATION AND CURETTAGE OF UTERUS    . HYSTEROSCOPY    . HYSTEROSCOPY WITH D & C N/A 10/12/2015   Procedure: DILATATION AND CURETTAGE /HYSTEROSCOPY;  Surgeon: Will Bonnet, MD;  Location: [facility] ORS;  Service: Gynecology;  Laterality: N/A;  . LAPAROSCOPIC BILATERAL SALPINGO OOPHERECTOMY N/A 10/12/2015   Procedure: LAPAROSCOPIC RIGHT SALPINGECTOMY, LEFT OOPHORECTOMY;  Surgeon: Will Bonnet, MD;  Location: [facility] ORS;  Service: Gynecology;  Laterality: N/A;  . LAPAROSCOPIC OVARIAN CYSTECTOMY Bilateral 10/12/2015   Procedure: LAPAROSCOPIC OVARIAN CYSTECTOMY;  Surgeon: Estill Bamberg  Glennon Mac, MD;  Location: [facility] ORS;  Service: Gynecology;  Laterality: Bilateral;  . OOPHORECTOMY    . OVARIAN CYST REMOVAL      Family History  Problem Relation Age of Onset  . Hypertension Mother   . Diabetes Mother   . CAD Mother   . Cancer Mother        cervical  . Heart attack Mother 73       Triple Bypass  . CAD Father   . Hypertension Father   . Arthritis Father   . Thyroid disease Sister   . Alzheimer's disease Maternal Grandmother   . Alzheimer's disease Maternal Grandfather   . Breast cancer Paternal Grandmother     Social History   Socioeconomic History  . Marital status: Married    Spouse name: Kasandra Knudsen  . Number of children: 0  . Years of education: Not on file  . Highest education level: Some college, no degree  Occupational History  . Occupation: Museum/gallery curator  Tobacco Use  . Smoking status: Former Smoker    Years: 1.00    Types: Cigarettes    Start date: 12/03/2001    Quit date: 12/28/2002    Years since quitting: 17.4  . Smokeless tobacco: Never Used  . Tobacco comment: smoked 3 to 4 cigarrettes a day for a year  Vaping Use  . Vaping Use: Never used  Substance and Sexual Activity  . Alcohol use: Yes    Alcohol/week: 0.0 standard drinks    Comment: occasionally drinks margarita, once or twice a year  . Drug use: No  . Sexual activity: Yes    Partners: Male    Comment:  Partial Hysterectomy  Other Topics Concern  . Not on file  Social History Narrative  . Not on file   Social Determinants of Health   Financial Resource Strain: Not on file  Food Insecurity: Not on file  Transportation Needs: Not on file  Physical Activity: Not on file  Stress: Not on file  Social Connections: Not on file  Intimate Partner Violence: Not on file     Current Outpatient Medications:  .  albuterol (VENTOLIN HFA) 108 (90 Base) MCG/ACT inhaler, Inhale 2 puffs into the lungs every 6 (six) hours as needed for wheezing or shortness of breath., Disp: 18 g, Rfl: 0 .  Alpha Lipoic Acid 200 MG CAPS, Take by mouth., Disp: , Rfl:  .  aspirin EC 81 MG tablet, Take 1 tablet (81 mg total) by mouth daily., Disp: 30 tablet, Rfl: 0 .  Cetirizine HCl (ZYRTEC PO), Take 10 mg by mouth daily., Disp: , Rfl:  .  Cholecalciferol (VITAMIN D) 2000 UNITS tablet, Take 1 tablet by mouth daily., Disp: , Rfl:  .  fluconazole (DIFLUCAN) 150 MG tablet, Take 1 tablet (150 mg total) by mouth every other day. Prn, Disp: 12 tablet, Rfl: 0 .  fluticasone (FLONASE) 50 MCG/ACT nasal spray, Place 2 sprays into both nostrils daily., Disp: 16 g, Rfl: 5 .  Fluticasone-Umeclidin-Vilant (TRELEGY ELLIPTA) 100-62.5-25 MCG/INH AEPB, Inhale 1 puff into the lungs daily., Disp: 60 each, Rfl: 2 .  gabapentin (NEURONTIN) 300 MG capsule, Take 300 mg by mouth at bedtime., Disp: , Rfl:  .  ipratropium-albuterol (DUONEB) 0.5-2.5 (3) MG/3ML SOLN, Take 3 mLs by nebulization every 4 (four) hours as needed., Disp: 120 mL, Rfl: 0 .  meloxicam (MOBIC) 15 MG tablet, Take 1 tablet (15 mg total) by mouth daily., Disp: 90 tablet, Rfl: 0 .  montelukast (SINGULAIR) 10 MG tablet,  Take 1 tablet (10 mg total) by mouth at bedtime., Disp: 90 tablet, Rfl: 1 .  olmesartan-hydrochlorothiazide (BENICAR HCT) 20-12.5 MG tablet, Take 1 tablet by mouth daily., Disp: 90 tablet, Rfl: 1 .  omeprazole (PRILOSEC) 20 MG capsule, Take 1 capsule (20 mg total) by  mouth daily., Disp: 90 capsule, Rfl: 1 .  Semaglutide (RYBELSUS) 7 MG TABS, Take 1 tablet by mouth daily., Disp: 30 tablet, Rfl: 2 .  Empagliflozin-metFORMIN HCl ER (SYNJARDY XR) 25-1000 MG TB24, Take 1 tablet by mouth daily. (Patient not taking: No sig reported), Disp: 30 tablet, Rfl: 2 .  ergocalciferol (VITAMIN D2) 1.25 MG (50000 UT) capsule, Take 1 capsule by mouth once a week. (Patient not taking: Reported on 06/03/2020), Disp: , Rfl:  .  potassium chloride SA (KLOR-CON) 20 MEQ tablet, Take 20 mEq by mouth 2 (two) times daily. (Patient not taking: Reported on 06/03/2020), Disp: , Rfl:  .  Rosuvastatin Calcium 40 MG CPSP, Take 1 tablet by mouth daily. (Patient not taking: No sig reported), Disp: 90 capsule, Rfl: 1  Allergies  Allergen Reactions  . Fish Allergy Shortness Of Breath  . Penicillins Anaphylaxis and Rash    Childhood allergy  . Levofloxacin Other (See Comments)    Cramping  . Adhesive [Tape] Rash    Paper tape ok to use.     ROS  Constitutional: Negative for fever or weight change.  Respiratory: Negative for cough and shortness of breath.   Cardiovascular: Negative for chest pain or palpitations.  Gastrointestinal: Negative for abdominal pain, no bowel changes.  Musculoskeletal: Negative for gait problem or joint swelling.  Skin: Negative for rash.  Neurological: Negative for dizziness or headache.  No other specific complaints in a complete review of systems (except as listed in HPI above).  Objective  Vitals:   06/03/20 1033  BP: 132/88  Pulse: 88  Resp: 16  Temp: 98.4 F (36.9 C)  TempSrc: Oral  SpO2: 96%  Weight: 269 lb (122 kg)  Height: 5' 2"  (1.575 m)    Body mass index is 49.2 kg/m.  Physical Exam  Constitutional: Patient appears well-developed and well-nourished. No distress.  HENT: Head: Normocephalic and atraumatic. Ears: B TMs ok, no erythema or effusion; Nose: Not done  Mouth/Throat: not done  Eyes: Conjunctivae and EOM are normal. Pupils are  equal, round, and reactive to light. No scleral icterus.  Neck: Normal range of motion. Neck supple. No JVD present. No thyromegaly present.  Cardiovascular: Normal rate, regular rhythm and normal heart sounds.  No murmur heard. Trace  BLE edema. Pulmonary/Chest: Effort normal and breath sounds normal. No respiratory distress. Abdominal: Soft. Bowel sounds are normal, no distension. There is no tenderness. no masses Breast: no lumps or masses, no nipple discharge or rashes FEMALE GENITALIA:  Not done, goes to gyn  RECTAL: not done  Musculoskeletal: Normal range of motion, no joint effusions. No gross deformities Neurological: he is alert and oriented to person, place, and time. No cranial nerve deficit. Coordination, balance, strength, speech and gait are normal.  Skin: Skin is warm and dry. No rash noted. No erythema.  Psychiatric: Patient has a normal mood and affect. behavior is normal. Judgment and thought content normal.  Recent Results (from the past 2160 hour(s))  POCT HgB A1C     Status: Abnormal   Collection Time: 05/04/20  2:35 PM  Result Value Ref Range   Hemoglobin A1C 8.6 (A) 4.0 - 5.6 %   HbA1c POC (<> result, manual entry)  HbA1c, POC (prediabetic range)     HbA1c, POC (controlled diabetic range)     Fall Risk: Fall Risk  06/03/2020 05/04/2020 02/03/2020 01/22/2020 05/12/2019  Falls in the past year? 0 0 0 0 0  Number falls in past yr: 0 0 0 0 0  Injury with Fall? 0 0 0 0 0  Follow up - - - - -     Functional Status Survey: Is the patient deaf or have difficulty hearing?: No Does the patient have difficulty seeing, even when wearing glasses/contacts?: No Does the patient have difficulty concentrating, remembering, or making decisions?: No Does the patient have difficulty walking or climbing stairs?: Yes Does the patient have difficulty dressing or bathing?: No Does the patient have difficulty doing errands alone such as visiting a doctor's office or shopping?:  No   Assessment & Plan  1. Well adult exam  Reminded her of yearly eye exam, and also dental checks  2. Colon cancer screening  Referral was already placed, she is thinking about it, explained can wait until age 83   3. Need for Tdap vaccination  tdap   4. Encounter for screening mammogram for malignant neoplasm of breast  - MM 3D SCREEN BREAST BILATERAL; Future  5. Type 2 diabetes, controlled, with peripheral neuropathy (HCC)  - dapagliflozin propanediol (FARXIGA) 10 MG TABS tablet; Take 1 tablet (10 mg total) by mouth daily before breakfast.  Dispense: 90 tablet; Refill: 1 Cannot tolerate metformin  -USPSTF grade A and B recommendations reviewed with patient; age-appropriate recommendations, preventive care, screening tests, etc discussed and encouraged; healthy living encouraged; see AVS for patient education given to patient -Discussed importance of 150 minutes of physical activity weekly, eat two servings of fish weekly, eat one serving of tree nuts ( cashews, pistachios, pecans, almonds.Marland Kitchen) every other day, eat 6 servings of fruit/vegetables daily and drink plenty of water and avoid sweet beverages.

## 2020-06-03 ENCOUNTER — Ambulatory Visit (INDEPENDENT_AMBULATORY_CARE_PROVIDER_SITE_OTHER): Payer: 59 | Admitting: Family Medicine

## 2020-06-03 ENCOUNTER — Other Ambulatory Visit: Payer: Self-pay

## 2020-06-03 ENCOUNTER — Encounter: Payer: Self-pay | Admitting: Family Medicine

## 2020-06-03 VITALS — BP 132/88 | HR 88 | Temp 98.4°F | Resp 16 | Ht 62.0 in | Wt 269.0 lb

## 2020-06-03 DIAGNOSIS — Z1231 Encounter for screening mammogram for malignant neoplasm of breast: Secondary | ICD-10-CM

## 2020-06-03 DIAGNOSIS — Z1211 Encounter for screening for malignant neoplasm of colon: Secondary | ICD-10-CM | POA: Diagnosis not present

## 2020-06-03 DIAGNOSIS — E1142 Type 2 diabetes mellitus with diabetic polyneuropathy: Secondary | ICD-10-CM

## 2020-06-03 DIAGNOSIS — Z23 Encounter for immunization: Secondary | ICD-10-CM | POA: Diagnosis not present

## 2020-06-03 DIAGNOSIS — Z0000 Encounter for general adult medical examination without abnormal findings: Secondary | ICD-10-CM

## 2020-06-03 MED ORDER — DAPAGLIFLOZIN PROPANEDIOL 10 MG PO TABS: 10.0000 mg | ORAL_TABLET | Freq: Every day | ORAL | 1 refills | Status: DC

## 2020-06-03 NOTE — Patient Instructions (Signed)
Preventive Care 84-48 Years Old, Female Preventive care refers to lifestyle choices and visits with your health care provider that can promote health and wellness. This includes:  A yearly physical exam. This is also called an annual wellness visit.  Regular dental and eye exams.  Immunizations.  Screening for certain conditions.  Healthy lifestyle choices, such as: ? Eating a healthy diet. ? Getting regular exercise. ? Not using drugs or products that contain nicotine and tobacco. ? Limiting alcohol use. What can I expect for my preventive care visit? Physical exam Your health care provider will check your:  Height and weight. These may be used to calculate your BMI (body mass index). BMI is a measurement that tells if you are at a healthy weight.  Heart rate and blood pressure.  Body temperature.  Skin for abnormal spots. Counseling Your health care provider may ask you questions about your:  Past medical problems.  Family's medical history.  Alcohol, tobacco, and drug use.  Emotional well-being.  Home life and relationship well-being.  Sexual activity.  Diet, exercise, and sleep habits.  Work and work Statistician.  Access to firearms.  Method of birth control.  Menstrual cycle.  Pregnancy history. What immunizations do I need? Vaccines are usually given at various ages, according to a schedule. Your health care provider will recommend vaccines for you based on your age, medical history, and lifestyle or other factors, such as travel or where you work.   What tests do I need? Blood tests  Lipid and cholesterol levels. These may be checked every 5 years, or more often if you are over 3 years old.  Hepatitis C test.  Hepatitis B test. Screening  Lung cancer screening. You may have this screening every year starting at age 73 if you have a 30-pack-year history of smoking and currently smoke or have quit within the past 15 years.  Colorectal cancer  screening. ? All adults should have this screening starting at age 52 and continuing until age 17. ? Your health care provider may recommend screening at age 49 if you are at increased risk. ? You will have tests every 1-10 years, depending on your results and the type of screening test.  Diabetes screening. ? This is done by checking your blood sugar (glucose) after you have not eaten for a while (fasting). ? You may have this done every 1-3 years.  Mammogram. ? This may be done every 1-2 years. ? Talk with your health care provider about when you should start having regular mammograms. This may depend on whether you have a family history of breast cancer.  BRCA-related cancer screening. This may be done if you have a family history of breast, ovarian, tubal, or peritoneal cancers.  Pelvic exam and Pap test. ? This may be done every 3 years starting at age 10. ? Starting at age 11, this may be done every 5 years if you have a Pap test in combination with an HPV test. Other tests  STD (sexually transmitted disease) testing, if you are at risk.  Bone density scan. This is done to screen for osteoporosis. You may have this scan if you are at high risk for osteoporosis. Talk with your health care provider about your test results, treatment options, and if necessary, the need for more tests. Follow these instructions at home: Eating and drinking  Eat a diet that includes fresh fruits and vegetables, whole grains, lean protein, and low-fat dairy products.  Take vitamin and mineral supplements  as recommended by your health care provider.  Do not drink alcohol if: ? Your health care provider tells you not to drink. ? You are pregnant, may be pregnant, or are planning to become pregnant.  If you drink alcohol: ? Limit how much you have to 0-1 drink a day. ? Be aware of how much alcohol is in your drink. In the [place] one drink equals one 12 oz bottle of beer (355 mL), one 5 oz glass of  wine (148 mL), or one 1 oz glass of hard liquor (44 mL).   Lifestyle  Take daily care of your teeth and gums. Brush your teeth every morning and night with fluoride toothpaste. Floss one time each day.  Stay active. Exercise for at least 30 minutes 5 or more days each week.  Do not use any products that contain nicotine or tobacco, such as cigarettes, e-cigarettes, and chewing tobacco. If you need help quitting, ask your health care provider.  Do not use drugs.  If you are sexually active, practice safe sex. Use a condom or other form of protection to prevent STIs (sexually transmitted infections).  If you do not wish to become pregnant, use a form of birth control. If you plan to become pregnant, see your health care provider for a prepregnancy visit.  If told by your health care provider, take low-dose aspirin daily starting at age 50.  Find healthy ways to cope with stress, such as: ? Meditation, yoga, or listening to music. ? Journaling. ? Talking to a trusted person. ? Spending time with friends and family. Safety  Always wear your seat belt while driving or riding in a vehicle.  Do not drive: ? If you have been drinking alcohol. Do not ride with someone who has been drinking. ? When you are tired or distracted. ? While texting.  Wear a helmet and other protective equipment during sports activities.  If you have firearms in your house, make sure you follow all gun safety procedures. What's next?  Visit your health care provider once a year for an annual wellness visit.  Ask your health care provider how often you should have your eyes and teeth checked.  Stay up to date on all vaccines. This information is not intended to replace advice given to you by your health care provider. Make sure you discuss any questions you have with your health care provider. Document Revised: 11/24/2019 Document Reviewed: 10/31/2017 Elsevier Patient Education  2021 Elsevier Inc.  

## 2020-06-09 NOTE — Telephone Encounter (Signed)
Opened in error

## 2020-06-09 NOTE — Telephone Encounter (Deleted)
Received email from Bing Quarry from [place] with the following information:  We have received this monitor back in the office and it is missing the registration.  Please place an order for this monitor so we can process the report.  Serial number : [id number] Patient initials: P.R. Application date : 10/04/6551 Account name : [place] Date received : 06/07/2020 Additional notes : leads on for 13 day(s) 23 hour(s) starting at 05/19/2020   I entered the order

## 2020-06-30 ENCOUNTER — Encounter: Payer: Self-pay | Admitting: Cardiology

## 2020-06-30 ENCOUNTER — Ambulatory Visit (INDEPENDENT_AMBULATORY_CARE_PROVIDER_SITE_OTHER): Payer: 59 | Admitting: Cardiology

## 2020-06-30 ENCOUNTER — Other Ambulatory Visit: Payer: Self-pay

## 2020-06-30 VITALS — BP 130/74 | HR 76 | Ht 62.0 in | Wt 268.0 lb

## 2020-06-30 DIAGNOSIS — R002 Palpitations: Secondary | ICD-10-CM | POA: Diagnosis not present

## 2020-06-30 DIAGNOSIS — E7800 Pure hypercholesterolemia, unspecified: Secondary | ICD-10-CM | POA: Diagnosis not present

## 2020-06-30 DIAGNOSIS — I10 Essential (primary) hypertension: Secondary | ICD-10-CM

## 2020-06-30 NOTE — Progress Notes (Signed)
Cardiology Office Note:    Date:  06/30/2020   ID:  [name], DOB 16-Apr-1970, MRN [id number]  PCP:  Steele Sizer, [place]  Cardiologist:  Kate Sable, MD  Advanced Practice Provider:  No care team member to display Electrophysiologist:  None       Referring MD: Steele Sizer, MD   Chief Complaint  Patient presents with  . Other    6 week follow up. Meds reviewed verbally with patient.      History of Present Illness:    Kelsey Alvarez is a 50 y.o. female with a hx of hypertension, hyperlipidemia, obesity who presents for follow-up.  She was last seen due to abnormal ECG and palpitations.  Previous EKG was normal, cardiac monitor was placed to evaluate any significant arrhythmias.  She now presents for results.  Still has occasional palpitations.  Denies dizziness, presyncope or syncope.  Past Medical History:  Diagnosis Date  . Abnormal CBC   . Allergy   . Arthritis    left knee  . Asthma   . Cervical polyp   . Chronic foot pain   . Complication of anesthesia    takes longer to wake up.  . Contact dermatitis   . Diabetes mellitus without complication ([place])   . Dysmenorrhea   . Edema   . 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 Fibroid uterus    left side, managed by Dr. Glennon Mac @ [facility]  . GERD (gastroesophageal reflux disease)   . Headache    history of migraines  . Hyperlipidemia   . Hypertension   . Lumbago   . Neuropathy   . Obesity   . Occasional tremors     Past Surgical History:  Procedure Laterality Date  . APPENDECTOMY    . cervical polyp removal    . DILATION AND CURETTAGE OF UTERUS    . HYSTEROSCOPY    . HYSTEROSCOPY WITH D & C N/A 10/12/2015   Procedure: DILATATION AND CURETTAGE /HYSTEROSCOPY;  Surgeon: Will Bonnet, MD;  Location: [facility] ORS;  Service: Gynecology;  Laterality: N/A;  . LAPAROSCOPIC BILATERAL SALPINGO OOPHERECTOMY N/A 10/12/2015   Procedure: LAPAROSCOPIC RIGHT SALPINGECTOMY,  LEFT OOPHORECTOMY;  Surgeon: Will Bonnet, MD;  Location: [facility] OR[facility];  Service: Gynecology;  Laterality: N/A;  . LAPAROSCOPIC OVARIAN CYSTECTOMY Bilateral 10/12/2015   Procedure: LAPAROSCOPIC OVARIAN CYSTECTOMY;  Surgeon: Will Bonnet, MD;  Location: [facility] OR[facility];  Service: Gynecology;  Laterality: Bilateral;  . OOPHORECTOMY    . OVARIAN CYST REMOVAL      Current Medications: Current Meds  Medication Sig  . albuterol (VENTOLIN HFA) 108 (90 Base) MCG/ACT inhaler Inhale 2 puffs into the lungs every 6 (six) hours as needed for wheezing or shortness of breath.  Ilean Skill Lipoic Acid 200 MG CAPS Take by mouth.  Marland Kitchen aspirin EC 81 MG tablet Take 1 tablet (81 mg total) by mouth daily.  . Cetirizine HCl (ZYRTEC PO) Take 10 mg by mouth daily.  . Cholecalciferol (VITAMIN D) 2000 UNITS tablet Take 1 tablet by mouth daily.  . dapagliflozin propanediol (FARXIGA) 10 MG TABS tablet Take 1 tablet (10 mg total) by mouth daily before breakfast.  . fluconazole (DIFLUCAN) 150 MG tablet Take 1 tablet (150 mg total) by mouth every other day. Prn  . fluticasone (FLONASE) 50 MCG/ACT nasal spray Place 2 sprays into both nostrils daily.  . Fluticasone-Umeclidin-Vilant (TRELEGY ELLIPTA) 100-62.5-25 MCG/INH AEPB Inhale 1 puff into the lungs daily.  Marland Kitchen gabapentin (NEURONTIN)  300 MG capsule Take 300 mg by mouth at bedtime.  Marland Kitchen ipratropium-albuterol (DUONEB) 0.5-2.5 (3) MG/3ML SOLN Take 3 mLs by nebulization every 4 (four) hours as needed.  . meloxicam (MOBIC) 15 MG tablet Take 1 tablet (15 mg total) by mouth daily.  . montelukast (SINGULAIR) 10 MG tablet Take 1 tablet (10 mg total) by mouth at bedtime.  Marland Kitchen olmesartan-hydrochlorothiazide (BENICAR HCT) 20-12.5 MG tablet Take 1 tablet by mouth daily.  Marland Kitchen omeprazole (PRILOSEC) 20 MG capsule Take 1 capsule (20 mg total) by mouth daily.  . Rosuvastatin Calcium 40 MG CPSP Take 1 tablet by mouth daily.  . Semaglutide (RYBELSUS) 7 MG TABS Take 1 tablet by mouth daily.     Allergies:    Fish allergy, Penicillins, Levofloxacin, and Adhesive [tape]   Social History   Socioeconomic History  . Marital status: Married    Spouse name: Kasandra Knudsen  . Number of children: 0  . Years of education: Not on file  . Highest education level: Some college, no degree  Occupational History  . Occupation: Museum/gallery curator  Tobacco Use  . Smoking status: Former Smoker    Years: 1.00    Types: Cigarettes    Start date: 12/03/2001    Quit date: 12/28/2002    Years since quitting: 17.5  . Smokeless tobacco: Never Used  . Tobacco comment: smoked 3 to 4 cigarrettes a day for a year  Vaping Use  . Vaping Use: Never used  Substance and Sexual Activity  . Alcohol use: Yes    Alcohol/week: 0.0 standard drinks    Comment: occasionally drinks margarita, once or twice a year  . Drug use: No  . Sexual activity: Yes    Partners: Male    Comment: Partial Hysterectomy  Other Topics Concern  . Not on file  Social History Narrative  . Not on file   Social Determinants of Health   Financial Resource Strain: Low Risk   . Difficulty of Paying Living Expenses: Not hard at all  Food Insecurity: No Food Insecurity  . Worried About Charity fundraiser in the Last Year: Never true  . Ran Out of Food in the Last Year: Never true  Transportation Needs: No Transportation Needs  . Lack of Transportation (Medical): No  . Lack of Transportation (Non-Medical): No  Physical Activity: Insufficiently Active  . Days of Exercise per Week: 2 days  . Minutes of Exercise per Session: 20 min  Stress: No Stress Concern Present  . Feeling of Stress : Not at all  Social Connections: Moderately Integrated  . Frequency of Communication with Friends and Family: More than three times a week  . Frequency of Social Gatherings with Friends and Family: Once a week  . Attends Religious Services: More than 4 times per year  . Active Member of Clubs or Organizations: No  . Attends Archivist Meetings: Never  .  Marital Status: Married     Family History: The patient's family history includes Alzheimer's disease in her maternal grandfather and maternal grandmother; Arthritis in her father; Breast cancer in her paternal grandmother; CAD in her father and mother; Cancer in her mother; Diabetes in her mother; Heart attack (age of onset: 91) in her mother; Hypertension in her father and mother; Thyroid disease in her sister.  ROS:   Please see the history of present illness.     All other systems reviewed and are negative.  EKGs/Labs/Other Studies Reviewed:    The following studies were reviewed today:  EKG:  EKG not ordered today.   Recent Labs: 02/03/2020: ALT 33; BUN 5; Creat 0.67; Hemoglobin 14.3; Platelets 272; Potassium 3.7; Sodium 139; TSH 1.12  Recent Lipid Panel    Component Value Date/Time   CHOL 182 02/03/2020 1138   TRIG 159 (H) 02/03/2020 1138   HDL 38 (L) 02/03/2020 1138   CHOLHDL 4.8 02/03/2020 1138   VLDL 23 10/09/2016 1200   LDLCALC 117 (H) 02/03/2020 1138     Risk Assessment/Calculations:      Physical Exam:    VS:  BP 130/74 (BP Location: Left Arm, Patient Position: Sitting, Cuff Size: Large)   Pulse 76   Ht 5\' 2"  (1.575 m)   Wt 268 lb (121.6 kg)   LMP 04/21/2019   SpO2 97%   BMI 49.02 kg/m     Wt Readings from Last 3 Encounters:  06/30/20 268 lb (121.6 kg)  06/03/20 269 lb (122 kg)  05/19/20 272 lb (123.4 kg)     GEN:  Well nourished, well developed in no acute distress HEENT: Normal NECK: No JVD; No carotid bruits LYMPHATICS: No lymphadenopathy CARDIAC: RRR, no murmurs, rubs, gallops RESPIRATORY: Clear anteriorly, diminished in bases ABDOMEN: Soft, non-tender, distended MUSCULOSKELETAL:  No edema; No deformity  SKIN: Warm and dry NEUROLOGIC:  Alert and oriented x 3 PSYCHIATRIC:  Normal affect   ASSESSMENT:    1. Palpitations   2. Primary hypertension   3. Pure hypercholesterolemia   4. Morbid obesity ([place])    PLAN:    In order of  problems listed above:  1. Palpitations, 2-week cardiac monitor with no significant arrhythmias, patient triggered events associated with sinus tachycardia.  Overall benign cardiac monitor.  Patient made aware of results and reassured. 2. Hypertension, BP controlled, continue olmesartan, HCTZ 3. Hyperlipidemia, continue intensity statin 4. Patient is morbidly obese, weight loss again advised..  Follow-up as needed    Medication Adjustments/Labs and Tests Ordered: Current medicines are reviewed at length with the patient today.  Concerns regarding medicines are outlined above.  No orders of the defined types were placed in this encounter.  No orders of the defined types were placed in this encounter.   Patient Instructions  Medication Instructions:  Your physician recommends that you continue on your current medications as directed. Please refer to the Current Medication list given to you today.  *If you need a refill on your cardiac medications before your next appointment, please call your pharmacy*   Lab Work: None ordered If you have labs (blood work) drawn today and your tests are completely normal, you will receive your results only by: Marland Kitchen MyChart Message (if you have MyChart) OR . A paper copy in the mail If you have any lab test that is abnormal or we need to change your treatment, we will call you to review the results.   Testing/Procedures: None ordered   Follow-Up: At [facility], you and your health needs are our priority.  As part of our continuing mission to provide you with exceptional heart care, we have created designated Provider Care Teams.  These Care Teams include your primary Cardiologist (physician) and Advanced Practice Providers (APPs -  Physician Assistants and Nurse Practitioners) who all work together to provide you with the care you need, when you need it.  We recommend signing up for the patient portal called "MyChart".  Sign up information is  provided on this After Visit Summary.  MyChart is used to connect with patients for Virtual Visits (Telemedicine).  Patients are  able to view lab/test results, encounter notes, upcoming appointments, etc.  Non-urgent messages can be sent to your provider as well.   To learn more about what you can do with MyChart, go to [email or web address].    Your next appointment:   Follow up as needed   The format for your next appointment:   In Person  Provider:   Kate Sable, MD   Other Instructions     Signed, Kate Sable, MD  06/30/2020 1:02 PM    [place]

## 2020-06-30 NOTE — Patient Instructions (Signed)

## 2020-07-20 ENCOUNTER — Emergency Department
Admission: EM | Admit: 2020-07-20 | Discharge: 2020-07-20 | Disposition: A | Discharge status: Home / Self Care | Payer: 59 | Attending: Emergency Medicine | Admitting: Emergency Medicine

## 2020-07-20 ENCOUNTER — Other Ambulatory Visit: Payer: Self-pay

## 2020-07-20 ENCOUNTER — Encounter: Payer: Self-pay | Admitting: Emergency Medicine

## 2020-07-20 DIAGNOSIS — Z5321 Procedure and treatment not carried out due to patient leaving prior to being seen by health care provider: Secondary | ICD-10-CM | POA: Diagnosis not present

## 2020-07-20 DIAGNOSIS — G43909 Migraine, unspecified, not intractable, without status migrainosus: Secondary | ICD-10-CM | POA: Insufficient documentation

## 2020-07-20 DIAGNOSIS — R519 Headache, unspecified: Secondary | ICD-10-CM | POA: Diagnosis present

## 2020-07-20 NOTE — ED Triage Notes (Signed)
Pt to triage via w/c with no distress noted; reports rt sided HA that began hr PTA accomp by nausea and light sensitivity; st hx migraines

## 2020-08-05 ENCOUNTER — Other Ambulatory Visit: Payer: Self-pay | Admitting: Family Medicine

## 2020-08-05 DIAGNOSIS — I10 Essential (primary) hypertension: Secondary | ICD-10-CM

## 2020-08-16 ENCOUNTER — Other Ambulatory Visit: Payer: Self-pay | Admitting: Family Medicine

## 2020-08-16 DIAGNOSIS — K219 Gastro-esophageal reflux disease without esophagitis: Secondary | ICD-10-CM

## 2020-09-04 IMAGING — CR DG FOOT COMPLETE 3+V*R*
1 series · 3 of 3 positions shown · non-contrast
Comparison: None.

CLINICAL DATA: Pain at lateral fifth metatarsal after stepping on a
root 4 weeks ago. Intermittent swelling.

EXAM:
RIGHT FOOT COMPLETE - 3+ VIEW

[Series 1: dg foot complete right · 0.14mm/px · 3 of 3 slices shown]
[im 1/3]
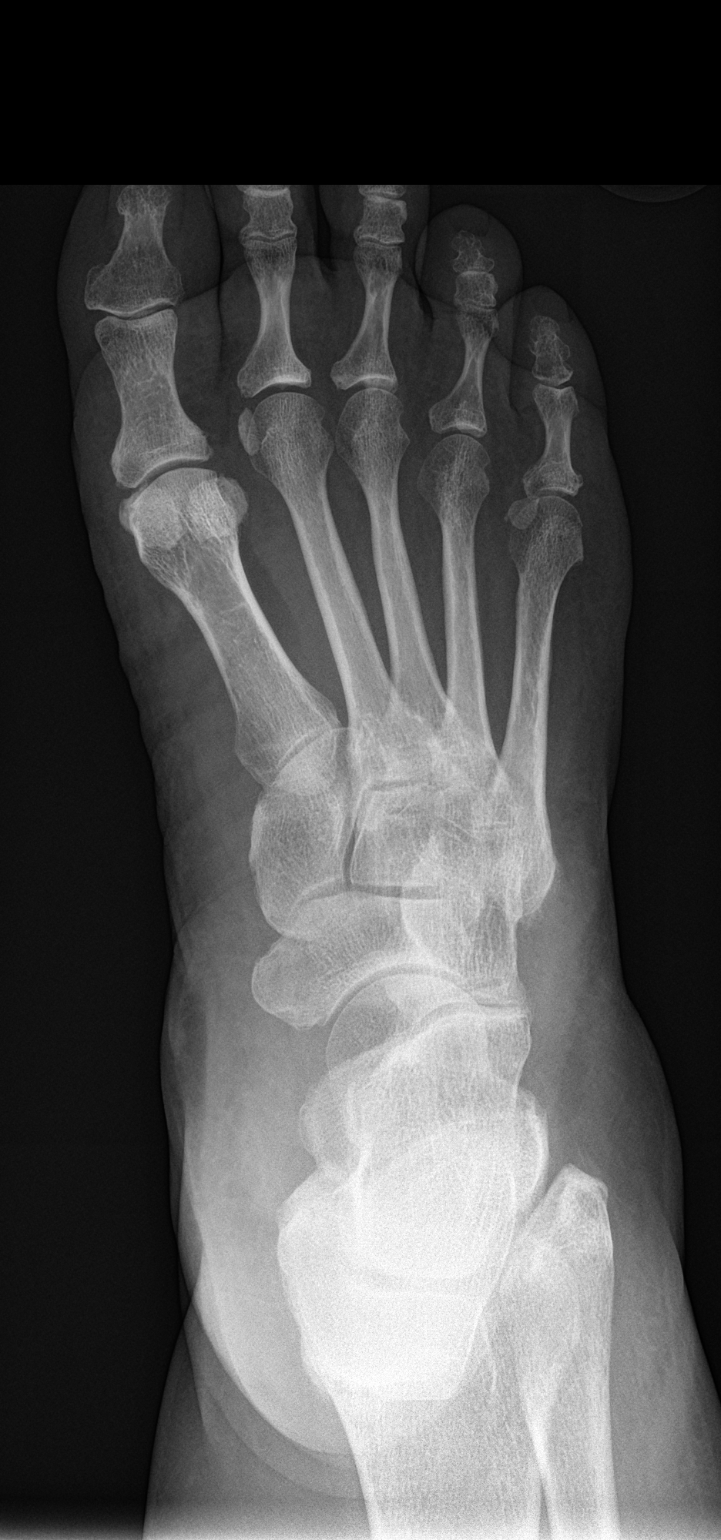
[im 2/3]
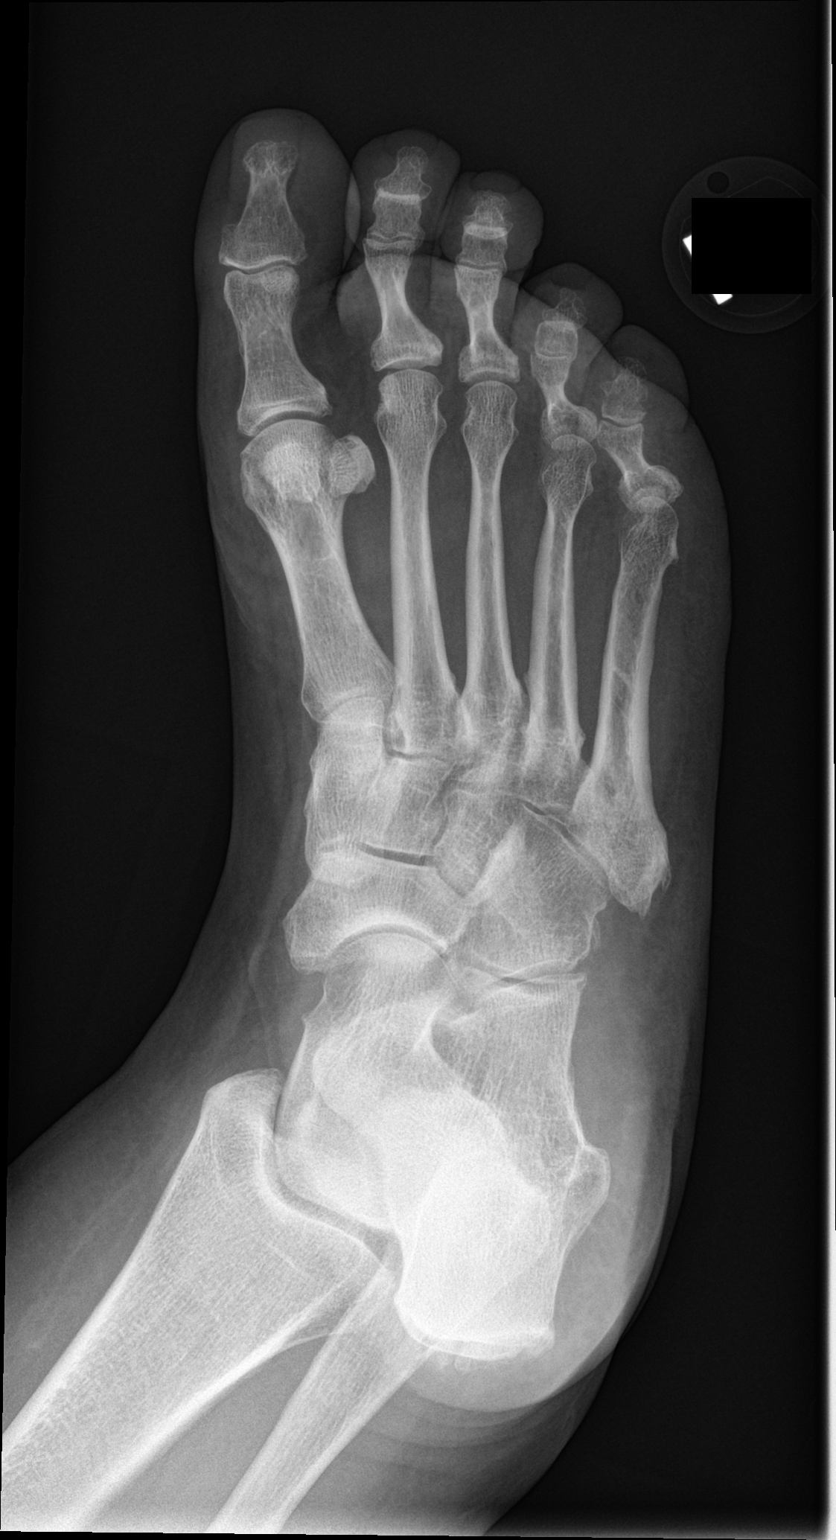
[im 3/3]
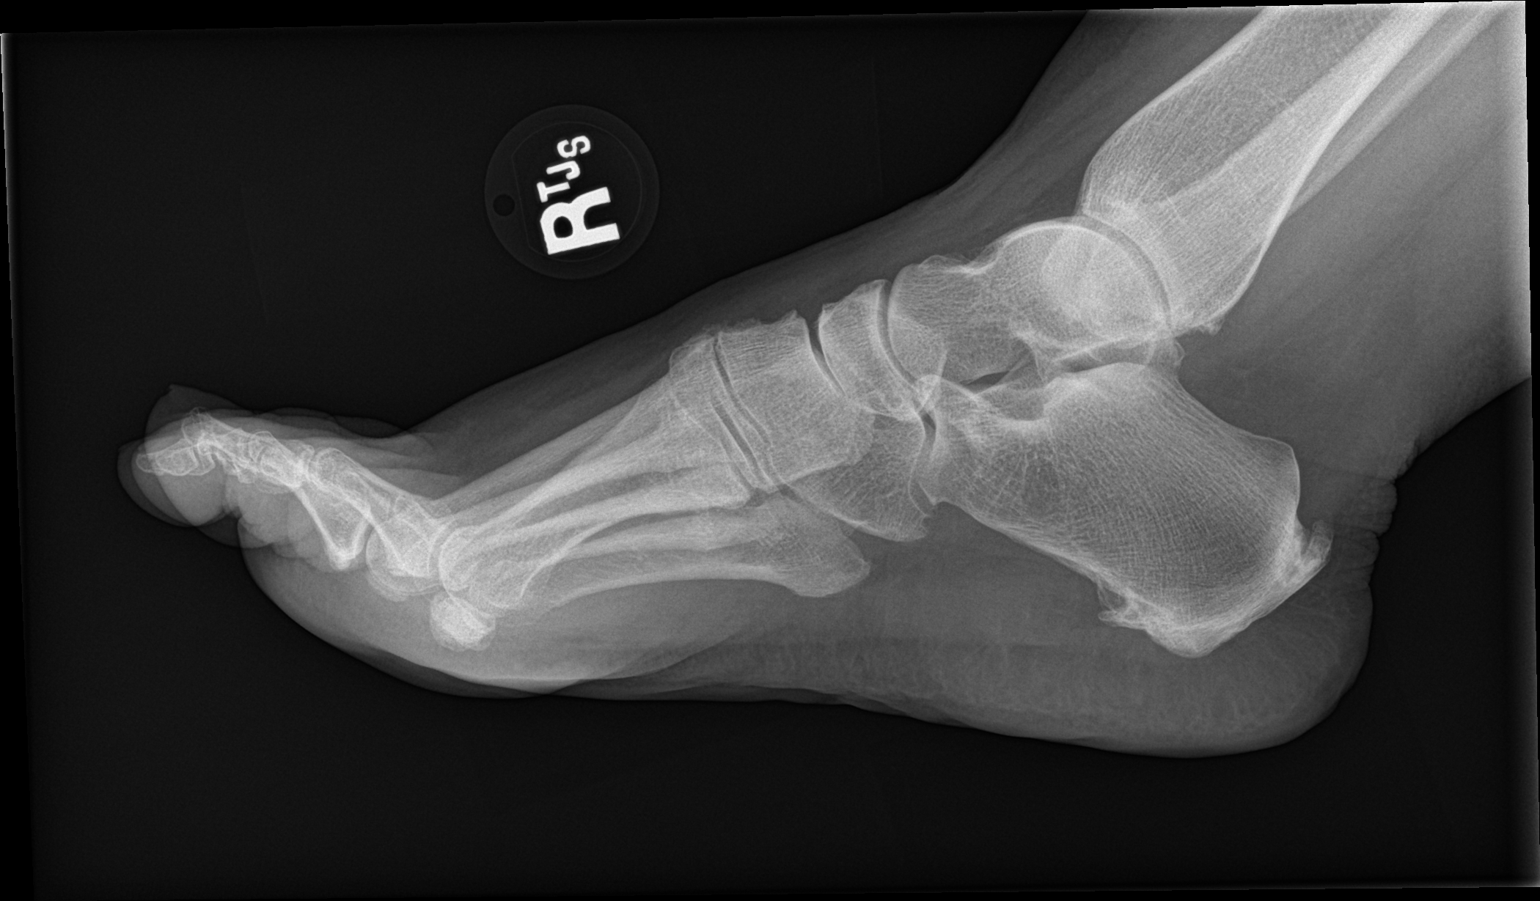

[3 of 3 positions shown; findings below may reference images not displayed]

FINDINGS: Enthesopathic changes at the base of the fifth metatarsal. No
fractures or dislocations. No other acute abnormalities.
Degenerative changes in the midfoot. Plantar spur off the calcaneus.
Enthesopathic changes on the posterior calcaneus at the Achilles
insertion site.
IMPRESSION: 1. Enthesopathic changes at the base of the fifth metatarsal. No
other cause for pain in this region identified. Degenerative changes
and enthesopathic changes as above.

## 2020-09-09 ENCOUNTER — Other Ambulatory Visit: Payer: Self-pay | Admitting: Family Medicine

## 2020-09-09 DIAGNOSIS — J4540 Moderate persistent asthma, uncomplicated: Secondary | ICD-10-CM

## 2020-09-30 NOTE — Progress Notes (Signed)
Name: Kelsey Alvarez   MRN: [id number]    DOB: Nov 30, 1970   Date:10/03/2020       Progress Note  Subjective  Chief Complaint  Follow Up  HPI  Abnormal EKG: found during COVID-19 visit to Urgent Care, she also had low potassium. She is concerned because mother had heart disease and triple bypass surgery at age 48. She saw Cardiologist and was given reassurance after hold monitor was placed.   DM:  hgbA1C was 7.9%, it went up to 8.6 % , she was unable to tolerate Metformin or GLP-1 agonist , she is on Farxiga and we will add Actos today .She still has neuropathy symptoms ( hands and feet) but doing better on gabapentin. She denies polyphagia, but has noticed polydipsia and polyuria. Explained importance of changing her diet . She has not been taking Crestor ,discussed Good Rx    Morbid Obesity:off Ozempic, she stopped because she could not eat anything, she lost 12 lbs, we tried switching to Rybelsus but only able to tolerate 3 mg dose, her weight is down 8 lbs, and she states having difficulty eating duet o post-prandial peri-umbilical pain. She is willing to go see GI   Periumbilical pain: going on for the past month, pain is described as aching or cramping like and starts with first bite of food and takes 30-60 minutes after a meal to fell better. She states has loose bowel movements but that is normal to her. No blood in stools.    Asthma: she has moderate asthma, she is currently using Treleby and is doing well, has some SOB with strenous activity, but no wheezing. She has occasional cough, she states triggered by strong scent and also when working outside in the heat   Hyperlipidemia: she was on Atorvastatin but LDL was not at goal, we switched to Rosuvastatin but she states not covered by insurance. We will try PA or advised Good Rx   HTN:she denies  side effects of medication. Compliant with medication. She continues to have episodes of palpitations intermittently.   Vertigo: going on  for months, it happens when she rolls in her bed, started a couple of months ago. Sometimes has mild nausea, but no vomiting. She has intermittent tinnutis but not sure what side Discussed ENT referral but she wants to try the [facility] at home .     Patient Active Problem List   Diagnosis Date Noted   Type 2 diabetes, controlled, with peripheral neuropathy ([place]) 01/20/2018   Endometriosis 11/06/2017   Menorrhagia with irregular cycle 10/28/2017   Fibroid uterus 10/28/2017   Carpal tunnel syndrome on both sides 10/17/2017   B12 deficiency 04/27/2016   History of endometriosis 11/17/2015   Oligomenorrhea 10/12/2015   Benign essential HTN 12/28/2014   Cervical polyp 12/28/2014   Controlled type 2 diabetes mellitus with microalbuminuria ([place]) 12/28/2014   Dyslipidemia 12/28/2014   Dysmenorrhea 12/28/2014   Edema 12/28/2014   Female infertility 12/28/2014   Gastro-esophageal reflux disease without esophagitis 12/28/2014   Morbid obesity ([place]) 12/28/2014   Asthma, moderate persistent, poorly-controlled 12/28/2014   Perennial allergic rhinitis 12/28/2014   Intermittent tremor 12/28/2014   Vitamin D deficiency 12/28/2014   Intermittent low back pain 12/28/2014   Left sciatic nerve pain 12/28/2014    Past Surgical History:  Procedure Laterality Date   APPENDECTOMY     cervical polyp removal     DILATION AND CURETTAGE OF UTERUS     HYSTEROSCOPY     HYSTEROSCOPY WITH D & C N/A  10/12/2015   Procedure: DILATATION AND CURETTAGE /HYSTEROSCOPY;  Surgeon: Will Bonnet, MD;  Location: [facility] ORS;  Service: Gynecology;  Laterality: N/A;   LAPAROSCOPIC BILATERAL SALPINGO OOPHERECTOMY N/A 10/12/2015   Procedure: LAPAROSCOPIC RIGHT SALPINGECTOMY, LEFT OOPHORECTOMY;  Surgeon: Will Bonnet, MD;  Location: [facility] OR[facility];  Service: Gynecology;  Laterality: N/A;   LAPAROSCOPIC OVARIAN CYSTECTOMY Bilateral 10/12/2015   Procedure: LAPAROSCOPIC OVARIAN CYSTECTOMY;  Surgeon: Will Bonnet, MD;   Location: [facility] OR[facility];  Service: Gynecology;  Laterality: Bilateral;   OOPHORECTOMY     OVARIAN CYST REMOVAL      Family History  Problem Relation Age of Onset   Hypertension Mother    Diabetes Mother    CAD Mother    Cancer Mother        cervical   Heart attack Mother 47       Triple Bypass   CAD Father    Hypertension Father    Arthritis Father    Thyroid disease Sister    Alzheimer's disease Maternal Grandmother    Alzheimer's disease Maternal Grandfather    Breast cancer Paternal Grandmother     Social History   Tobacco Use   Smoking status: Former    Years: 1.00    Types: Cigarettes    Start date: 12/03/2001    Quit date: 12/28/2002    Years since quitting: 17.7   Smokeless tobacco: Never   Tobacco comments:    smoked 3 to 4 cigarrettes a day for a year  Substance Use Topics   Alcohol use: Yes    Alcohol/week: 0.0 standard drinks    Comment: occasionally drinks margarita, once or twice a year     Current Outpatient Medications:    albuterol (VENTOLIN HFA) 108 (90 Base) MCG/ACT inhaler, Inhale 2 puffs into the lungs every 6 (six) hours as needed for wheezing or shortness of breath., Disp: 18 g, Rfl: 0   Alpha Lipoic Acid 200 MG CAPS, Take by mouth., Disp: , Rfl:    aspirin EC 81 MG tablet, Take 1 tablet (81 mg total) by mouth daily., Disp: 30 tablet, Rfl: 0   Cetirizine HCl (ZYRTEC PO), Take 10 mg by mouth daily., Disp: , Rfl:    Cholecalciferol (VITAMIN D) 2000 UNITS tablet, Take 1 tablet by mouth daily., Disp: , Rfl:    dapagliflozin propanediol (FARXIGA) 10 MG TABS tablet, Take 1 tablet (10 mg total) by mouth daily before breakfast., Disp: 90 tablet, Rfl: 1   fluticasone (FLONASE) 50 MCG/ACT nasal spray, Place 2 sprays into both nostrils daily., Disp: 16 g, Rfl: 5   Fluticasone-Umeclidin-Vilant (TRELEGY ELLIPTA) 100-62.5-25 MCG/INH AEPB, Inhale 1 puff into the lungs daily., Disp: 60 each, Rfl: 2   gabapentin (NEURONTIN) 300 MG capsule, Take 300 mg by mouth at  bedtime., Disp: , Rfl:    ipratropium-albuterol (DUONEB) 0.5-2.5 (3) MG/3ML SOLN, Take 3 mLs by nebulization every 4 (four) hours as needed., Disp: 120 mL, Rfl: 0   meloxicam (MOBIC) 15 MG tablet, Take 1 tablet (15 mg total) by mouth daily., Disp: 90 tablet, Rfl: 0   montelukast (SINGULAIR) 10 MG tablet, TAKE 1 TABLET BY MOUTH AT BEDTIME, Disp: 90 tablet, Rfl: 1   olmesartan-hydrochlorothiazide (BENICAR HCT) 20-12.5 MG tablet, TAKE 1 TABLET BY MOUTH ONCE DAILY, Disp: 90 tablet, Rfl: 1   omeprazole (PRILOSEC) 20 MG capsule, TAKE 1 CAPSULE BY MOUTH ONCE DAILY, Disp: 90 capsule, Rfl: 1   Rosuvastatin Calcium 40 MG CPSP, Take 1 tablet by mouth daily., Disp: 90 capsule, Rfl:  1   Semaglutide (RYBELSUS) 7 MG TABS, Take 1 tablet by mouth daily., Disp: 30 tablet, Rfl: 2   fluconazole (DIFLUCAN) 150 MG tablet, Take 1 tablet (150 mg total) by mouth every other day. Prn (Patient not taking: Reported on 10/03/2020), Disp: 12 tablet, Rfl: 0  Allergies  Allergen Reactions   Fish Allergy Shortness Of Breath   Penicillins Anaphylaxis and Rash    Childhood allergy   Levofloxacin Other (See Comments)    Cramping   Adhesive [Tape] Rash    Paper tape ok to use.    I personally reviewed active problem list, medication list, allergies, family history, social history, health maintenance with the patient/caregiver today.   ROS  Constitutional: Negative for fever or weight change.  Respiratory: Negative for cough and shortness of breath.   Cardiovascular: Negative for chest pain or palpitations.  Gastrointestinal: Negative for abdominal pain, no bowel changes.  Musculoskeletal: Negative for gait problem or joint swelling.  Skin: Negative for rash.  Neurological: Negative for dizziness or headache.  No other specific complaints in a complete review of systems (except as listed in HPI above).   Objective  Vitals:   10/03/20 0933  BP: 132/72  Pulse: 81  Resp: 16  Temp: 98.2 F (36.8 C)  SpO2: 97%   Weight: 260 lb (117.9 kg)  Height: '5\' 2"'$  (1.575 m)    Body mass index is 47.55 kg/m.  Physical Exam  Constitutional: Patient appears well-developed and well-nourished. Obese  No distress.  HEENT: head atraumatic, normocephalic, pupils equal and reactive to light, neck supple Cardiovascular: Normal rate, regular rhythm and normal heart sounds.  No murmur heard. No BLE edema. Pulmonary/Chest: Effort normal and breath sounds normal. No respiratory distress. Abdominal: Soft.  There is epigastric and periumbilical tenderness. No hernias, no masses, no guarding or rebound tenderness  Psychiatric: Patient has a normal mood and affect. behavior is normal. Judgment and thought content normal.   Recent Results (from the past 2160 hour(s))  POCT HgB A1C     Status: Abnormal   Collection Time: 10/03/20  9:45 AM  Result Value Ref Range   Hemoglobin A1C 9.0 (A) 4.0 - 5.6 %   HbA1c POC (<> result, manual entry)     HbA1c, POC (prediabetic range)     HbA1c, POC (controlled diabetic range)       PHQ2/9: Depression screen [facility] 2/9 10/03/2020 06/03/2020 05/04/2020 02/03/2020 01/22/2020  Decreased Interest 0 0 0 0 0  Down, Depressed, Hopeless 0 0 0 0 0  PHQ - 2 Score 0 0 0 0 0  Altered sleeping - - - 2 2  Tired, decreased energy - - - 1 1  Change in appetite - - - 0 0  Feeling bad or failure about yourself  - - - 0 0  Trouble concentrating - - - 0 0  Moving slowly or fidgety/restless - - - 0 0  Suicidal thoughts - - - 0 0  PHQ-9 Score - - - 3 3  Difficult doing work/chores - - - - Not difficult at all  Some recent data might be hidden    phq 9 is negative   Fall Risk: Fall Risk  10/03/2020 06/03/2020 05/04/2020 02/03/2020 01/22/2020  Falls in the past year? 0 0 0 0 0  Number falls in past yr: 0 0 0 0 0  Injury with Fall? 0 0 0 0 0  Follow up - - - - -      Functional Status Survey: Is the  patient deaf or have difficulty hearing?: No Does the patient have difficulty seeing, even when wearing  glasses/contacts?: No Does the patient have difficulty concentrating, remembering, or making decisions?: No Does the patient have difficulty walking or climbing stairs?: Yes Does the patient have difficulty dressing or bathing?: No Does the patient have difficulty doing errands alone such as visiting a doctor's office or shopping?: No    Assessment & Plan  1. Type 2 diabetes, controlled, with peripheral neuropathy (HCC)  - POCT HgB A1C  2. Benign essential HTN   3. Morbid obesity ([place])  Discussed with the patient the risk posed by an increased BMI. Discussed importance of portion control, calorie counting and at least 150 minutes of physical activity weekly. Avoid sweet beverages and drink more water. Eat at least 6 servings of fruit and vegetables daily    4. Asthma, moderate persistent, poorly-controlled  - Fluticasone-Umeclidin-Vilant (TRELEGY ELLIPTA) 100-62.5-25 MCG/INH AEPB; Inhale 1 puff into the lungs daily.  Dispense: 60 each; Refill: 2 - albuterol (VENTOLIN HFA) 108 (90 Base) MCG/ACT inhaler; Inhale 2 puffs into the lungs every 6 (six) hours as needed for wheezing or shortness of breath.  Dispense: 18 g; Refill: 0  5. Vitamin D deficiency   6. B12 deficiency   7. Gastro-esophageal reflux disease without esophagitis  She is on PPI  8. Dyslipidemia associated with type 2 diabetes mellitus (HCC)  - pioglitazone (ACTOS) 15 MG tablet; Take 1 tablet (15 mg total) by mouth daily.  Dispense: 90 tablet; Refill: 1 - rosuvastatin (CRESTOR) 40 MG tablet; Take 1 tablet (40 mg total) by mouth daily.  Dispense: 90 tablet; Refill: 1  9. Colon cancer screening  - Ambulatory referral to Gastroenterology  10. Periumbilical pain  - Ambulatory referral to Gastroenterology  11. Benign paroxysmal vertigo, unspecified laterality

## 2020-10-03 ENCOUNTER — Ambulatory Visit: Payer: 59 | Admitting: Family Medicine

## 2020-10-03 ENCOUNTER — Encounter: Payer: Self-pay | Admitting: Family Medicine

## 2020-10-03 ENCOUNTER — Other Ambulatory Visit: Payer: Self-pay

## 2020-10-03 VITALS — BP 132/72 | HR 81 | Temp 98.2°F | Resp 16 | Ht 62.0 in | Wt 260.0 lb

## 2020-10-03 DIAGNOSIS — Z1211 Encounter for screening for malignant neoplasm of colon: Secondary | ICD-10-CM

## 2020-10-03 DIAGNOSIS — H8110 Benign paroxysmal vertigo, unspecified ear: Secondary | ICD-10-CM

## 2020-10-03 DIAGNOSIS — E538 Deficiency of other specified B group vitamins: Secondary | ICD-10-CM

## 2020-10-03 DIAGNOSIS — E559 Vitamin D deficiency, unspecified: Secondary | ICD-10-CM

## 2020-10-03 DIAGNOSIS — J4540 Moderate persistent asthma, uncomplicated: Secondary | ICD-10-CM | POA: Diagnosis not present

## 2020-10-03 DIAGNOSIS — E785 Hyperlipidemia, unspecified: Secondary | ICD-10-CM

## 2020-10-03 DIAGNOSIS — E1169 Type 2 diabetes mellitus with other specified complication: Secondary | ICD-10-CM

## 2020-10-03 DIAGNOSIS — I10 Essential (primary) hypertension: Secondary | ICD-10-CM | POA: Diagnosis not present

## 2020-10-03 DIAGNOSIS — R1033 Periumbilical pain: Secondary | ICD-10-CM

## 2020-10-03 DIAGNOSIS — K219 Gastro-esophageal reflux disease without esophagitis: Secondary | ICD-10-CM

## 2020-10-03 DIAGNOSIS — E1142 Type 2 diabetes mellitus with diabetic polyneuropathy: Secondary | ICD-10-CM | POA: Diagnosis not present

## 2020-10-03 LAB — POCT GLYCOSYLATED HEMOGLOBIN (HGB A1C): Hemoglobin A1C: 9 % — AB (ref 4.0–5.6)

## 2020-10-03 MED ORDER — PIOGLITAZONE HCL 15 MG PO TABS: 15.0000 mg | ORAL_TABLET | Freq: Every day | ORAL | 1 refills | Status: DC

## 2020-10-03 MED ORDER — ALBUTEROL SULFATE HFA 108 (90 BASE) MCG/ACT IN AERS: 2.0000 | INHALATION_SPRAY | Freq: Four times a day (QID) | RESPIRATORY_TRACT | 0 refills | Status: DC | PRN

## 2020-10-03 MED ORDER — TRELEGY ELLIPTA 100-62.5-25 MCG/INH IN AEPB: 1.0000 | INHALATION_SPRAY | Freq: Every day | RESPIRATORY_TRACT | 2 refills | Status: DC

## 2020-10-03 MED ORDER — ROSUVASTATIN CALCIUM 40 MG PO TABS: 40.0000 mg | ORAL_TABLET | Freq: Every day | ORAL | 1 refills | Status: DC

## 2020-10-03 NOTE — Patient Instructions (Signed)
How to Perform the Epley Maneuver The Epley maneuver is an exercise that relieves symptoms of vertigo. Vertigo is the feeling that you or your surroundings are moving when they are not. When you feel vertigo, you may feel like the room is spinning and may have trouble walking. The Epley maneuver is used for a type of vertigo caused by a calcium deposit in a part of the inner ear. The maneuver involves changing headpositions to help the deposit move out of the area. You can do this maneuver at home whenever you have symptoms of vertigo. You canrepeat it in 24 hours if your vertigo has not gone away. Even though the Epley maneuver may relieve your vertigo for a few weeks, it is possible that your symptoms will return. This maneuver relieves vertigo, but itdoes not relieve dizziness. What are the risks? If it is done correctly, the Epley maneuver is considered safe. Sometimes it can lead to dizziness or nausea that goes away after a short time. If you develop other symptoms--such as changes in vision, weakness, or numbness--stopdoing the maneuver and call your health care provider. Supplies needed: A bed or table. A pillow. How to do the Epley maneuver     Sit on the edge of a bed or table with your back straight and your legs extended or hanging over the edge of the bed or table. Turn your head halfway toward the affected ear or side as told by your health care provider. Lie backward quickly with your head turned until you are lying flat on your back. Your head should dangle (head-hanging position). You may want to position a pillow under your shoulders. Hold this position for at least 30 seconds. If you feel dizzy or have symptoms of vertigo, continue to hold the position until the symptoms stop. Turn your head to the opposite direction until your unaffected ear is facing down. Your head should continue to dangle. Hold this position for at least 30 seconds. If you feel dizzy or have symptoms of  vertigo, continue to hold the position until the symptoms stop. Turn your whole body to the same side as your head so that you are positioned on your side. Your head will now be nearly facedown and no longer needs to dangle. Hold for at least 30 seconds. If you feel dizzy or have symptoms of vertigo, continue to hold the position until the symptoms stop. Sit back up. You can repeat the maneuver in 24 hours if your vertigo does not go away. Follow these instructions at home: For 24 hours after doing the Epley maneuver: Keep your head in an upright position. When lying down to sleep or rest, keep your head raised (elevated) with two or more pillows. Avoid excessive neck movements. Activity Do not drive or use machinery if you feel dizzy. After doing the Epley maneuver, return to your normal activities as told by your health care provider. Ask your health care provider what activities are safe for you. General instructions Drink enough fluid to keep your urine pale yellow. Do not drink alcohol. Take over-the-counter and prescription medicines only as told by your health care provider. Keep all follow-up visits. This is important. Preventing vertigo symptoms Ask your health care provider if there is anything you should do at home to prevent vertigo. He or she may recommend that you: Keep your head elevated with two or more pillows while you sleep. Do not sleep on the side of your affected ear. Get up slowly from bed.   Avoid sudden movements during the day. Avoid extreme head positions or movement, such as looking up or bending over. Contact a health care provider if: Your vertigo gets worse. You have other symptoms, including: Nausea. Vomiting. Headache. Get help right away if you: Have vision changes. Have a headache or neck pain that is severe or getting worse. Cannot stop vomiting. Have new numbness or weakness in any part of your body. These symptoms may represent a serious problem  that is an emergency. Do not wait to see if the symptoms will go away. Get medical help right away. Call your local emergency services (911 in the [place] Do not drive yourself to the hospital. Summary Vertigo is the feeling that you or your surroundings are moving when they are not. The Epley maneuver is an exercise that relieves symptoms of vertigo. If the Epley maneuver is done correctly, it is considered safe. This information is not intended to replace advice given to you by your health care provider. Make sure you discuss any questions you have with your healthcare provider. Document Revised: 01/20/2020 Document Reviewed: 01/20/2020 Elsevier Patient Education  2022 [employer] Inc[employer]  

## 2020-10-04 ENCOUNTER — Encounter: Payer: Self-pay | Admitting: *Deleted

## 2020-11-03 ENCOUNTER — Encounter: Payer: Self-pay | Admitting: Family Medicine

## 2020-11-04 ENCOUNTER — Other Ambulatory Visit: Payer: Self-pay | Admitting: Family Medicine

## 2020-11-26 ENCOUNTER — Other Ambulatory Visit: Payer: Self-pay | Admitting: Family Medicine

## 2020-11-26 DIAGNOSIS — E1142 Type 2 diabetes mellitus with diabetic polyneuropathy: Secondary | ICD-10-CM

## 2020-12-01 ENCOUNTER — Other Ambulatory Visit: Payer: Self-pay | Admitting: Family Medicine

## 2020-12-01 DIAGNOSIS — Z1231 Encounter for screening mammogram for malignant neoplasm of breast: Secondary | ICD-10-CM

## 2020-12-08 ENCOUNTER — Other Ambulatory Visit: Payer: Self-pay

## 2020-12-08 ENCOUNTER — Other Ambulatory Visit (HOSPITAL_COMMUNITY)
Admission: RE | Admit: 2020-12-08 | Discharge: 2020-12-08 | Disposition: A | Discharge status: Home / Self Care | Payer: 59 | Source: Ambulatory Visit | Attending: Obstetrics and Gynecology | Admitting: Obstetrics and Gynecology

## 2020-12-08 ENCOUNTER — Ambulatory Visit (INDEPENDENT_AMBULATORY_CARE_PROVIDER_SITE_OTHER): Payer: 59 | Admitting: Obstetrics and Gynecology

## 2020-12-08 ENCOUNTER — Encounter: Payer: Self-pay | Admitting: Obstetrics and Gynecology

## 2020-12-08 VITALS — BP 128/88 | Ht 62.0 in | Wt 258.0 lb

## 2020-12-08 DIAGNOSIS — Z124 Encounter for screening for malignant neoplasm of cervix: Secondary | ICD-10-CM | POA: Diagnosis not present

## 2020-12-08 DIAGNOSIS — Z1331 Encounter for screening for depression: Secondary | ICD-10-CM | POA: Diagnosis not present

## 2020-12-08 DIAGNOSIS — N841 Polyp of cervix uteri: Secondary | ICD-10-CM

## 2020-12-08 DIAGNOSIS — Z1339 Encounter for screening examination for other mental health and behavioral disorders: Secondary | ICD-10-CM

## 2020-12-08 DIAGNOSIS — Z01419 Encounter for gynecological examination (general) (routine) without abnormal findings: Secondary | ICD-10-CM | POA: Diagnosis not present

## 2020-12-08 DIAGNOSIS — Z8742 Personal history of other diseases of the female genital tract: Secondary | ICD-10-CM

## 2020-12-08 NOTE — Progress Notes (Signed)
Gynecology Annual Exam  PCP: Steele Sizer, MD  Chief Complaint:  Chief Complaint  Patient presents with   Annual Exam    History of Present Illness:  Ms. Kelsey Alvarez is a 50 y.o. G0P0000 who LMP was Patient's last menstrual period was 04/20/2020., presents today for her annual examination.  Her menses are scant.   She does not have vasomotor sx. .  She is sexually active. She does have some mild vaginal dryness. She notes mild pain  Last Pap: 04/2016  Results were: no abnormalities /neg HPV DNA negative Hx of STDs: none  Last mammogram: 04/2019  Results were: normal--routine follow-up in 12 months There is no FH of breast cancer. There is no FH of ovarian cancer. The patient does do self-breast exams.  Colonoscopy: has not had DEXA: has not been screened for osteoporosis  Tobacco use: The patient denies current or previous tobacco use. Alcohol use: social drinker Exercise: tries to walk some  The patient wears seatbelts: yes.     Past Medical History:  Diagnosis Date   Abnormal CBC    Allergy    Arthritis    left knee   Asthma    Cervical polyp    Chronic foot pain    Complication of anesthesia    takes longer to wake up.   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 Glennon Mac @ [facility]   GERD (gastroesophageal reflux disease)    Headache    history of migraines   Hyperlipidemia    Hypertension    Lumbago    Neuropathy    Obesity    Occasional tremors     Past Surgical History:  Procedure Laterality Date   APPENDECTOMY     cervical polyp removal     DILATION AND CURETTAGE OF UTERUS     HYSTEROSCOPY     HYSTEROSCOPY WITH D & C N/A 10/12/2015   Procedure: DILATATION AND CURETTAGE /HYSTEROSCOPY;  Surgeon: Will Bonnet, MD;  Location: [facility];  Service: Gynecology;  Laterality: N/A;   LAPAROSCOPIC BILATERAL SALPINGO  OOPHERECTOMY N/A 10/12/2015   Procedure: LAPAROSCOPIC RIGHT SALPINGECTOMY, LEFT OOPHORECTOMY;  Surgeon: Will Bonnet, MD;  Location: [facility];  Service: Gynecology;  Laterality: N/A;   LAPAROSCOPIC OVARIAN CYSTECTOMY Bilateral 10/12/2015   Procedure: LAPAROSCOPIC OVARIAN CYSTECTOMY;  Surgeon: Will Bonnet, MD;  Location: [facility];  Service: Gynecology;  Laterality: Bilateral;   OOPHORECTOMY     OVARIAN CYST REMOVAL      Prior to Admission medications   Medication Sig Start Date End Date Taking? Authorizing Provider  albuterol (VENTOLIN HFA) 108 (90 Base) MCG/ACT inhaler Inhale 2 puffs into the lungs every 6 (six) hours as needed for wheezing or shortness of breath. 10/03/20   Steele Sizer, MD  Alpha Lipoic Acid 200 MG CAPS Take by mouth.    [provider]  aspirin EC 81 MG tablet Take 1 tablet (81 mg total) by mouth daily. 10/09/16   Steele Sizer, MD  Cetirizine HCl (ZYRTEC PO) Take 10 mg by mouth daily.    [provider]  Cholecalciferol (VITAMIN D) 2000 UNITS tablet Take 1 tablet by mouth daily. 08/23/10   [provider]  FARXIGA 10 MG TABS tablet TAKE 1 TABLET BY MOUTH ONCE DAILY BEFORE BREAKFAST 11/26/20   Steele Sizer, MD  fluticasone (FLONASE) 50  MCG/ACT nasal spray Place 2 sprays into both nostrils daily. 10/17/17   Steele Sizer, MD  Fluticasone-Umeclidin-Vilant (TRELEGY ELLIPTA) 100-62.5-25 MCG/INH AEPB Inhale 1 puff into the lungs daily. 10/03/20   Steele Sizer, MD  gabapentin (NEURONTIN) 300 MG capsule Take 300 mg by mouth at bedtime.    [provider]  ipratropium-albuterol (DUONEB) 0.5-2.5 (3) MG/3ML SOLN Take 3 mLs by nebulization every 4 (four) hours as needed. 01/20/18   Steele Sizer, MD  meloxicam (MOBIC) 15 MG tablet Take 1 tablet (15 mg total) by mouth daily. 02/03/20   Steele Sizer, MD  montelukast (SINGULAIR) 10 MG tablet TAKE 1 TABLET BY MOUTH AT BEDTIME 09/09/20   Steele Sizer, MD  olmesartan-hydrochlorothiazide  (BENICAR HCT) 20-12.5 MG tablet TAKE 1 TABLET BY MOUTH ONCE DAILY 08/05/20   Ancil Boozer, Drue Stager, MD  omeprazole (PRILOSEC) 20 MG capsule TAKE 1 CAPSULE BY MOUTH ONCE DAILY 08/16/20   Steele Sizer, MD  pioglitazone (ACTOS) 15 MG tablet Take 1 tablet (15 mg total) by mouth daily. 10/03/20   Steele Sizer, MD  rosuvastatin (CRESTOR) 40 MG tablet Take 1 tablet (40 mg total) by mouth daily. 10/03/20   Steele Sizer, MD    Allergies  Allergen Reactions   Fish Allergy Shortness Of Breath   Penicillins Anaphylaxis and Rash    Childhood allergy   Levofloxacin Other (See Comments)    Cramping   Adhesive [Tape] Rash    Paper tape ok to use.     Obstetric History: G0P0000  Family History  Problem Relation Age of Onset   Hypertension Mother    Diabetes Mother    CAD Mother    Cancer Mother        cervical   Heart attack Mother 63       Triple Bypass   CAD Father    Hypertension Father    Arthritis Father    Thyroid disease Sister    Alzheimer's disease Maternal Grandmother    Alzheimer's disease Maternal Grandfather    Breast cancer Paternal Grandmother     Social History   Socioeconomic History   Marital status: Married    Spouse name: Danny   Number of children: 0   Years of education: Not on file   Highest education level: Some college, no degree  Occupational History   Occupation: Museum/gallery curator  Tobacco Use   Smoking status: Former    Years: 1.00    Types: Cigarettes    Start date: 12/03/2001    Quit date: 12/28/2002    Years since quitting: 17.9   Smokeless tobacco: Never   Tobacco comments:    smoked 3 to 4 cigarrettes a day for a year  Vaping Use   Vaping Use: Never used  Substance and Sexual Activity   Alcohol use: Yes    Alcohol/week: 0.0 standard drinks    Comment: occasionally drinks margarita, once or twice a year   Drug use: No   Sexual activity: Yes    Partners: Male    Comment: Partial Hysterectomy  Other Topics Concern   Not on file  Social History  Narrative   Not on file   Social Determinants of Health   Financial Resource Strain: Low Risk    Difficulty of Paying Living Expenses: Not hard at all  Food Insecurity: No Food Insecurity   Worried About Charity fundraiser in the Last Year: Never true   [place] in the Last Year: Never true  Transportation Needs: No Transportation Needs  Lack of Transportation (Medical): No   Lack of Transportation (Non-Medical): No  Physical Activity: Insufficiently Active   Days of Exercise per Week: 2 days   Minutes of Exercise per Session: 20 min  Stress: No Stress Concern Present   Feeling of Stress : Not at all  Social Connections: Moderately Integrated   Frequency of Communication with Friends and Family: More than three times a week   Frequency of Social Gatherings with Friends and Family: Once a week   Attends Religious Services: More than 4 times per year   Active Member of [employer] or Organizations: No   Attends Archivist Meetings: Never   Marital Status: Married  Human resources officer Violence: Not At Risk   Fear of Current or Ex-Partner: No   Emotionally Abused: No   Physically Abused: No   Sexually Abused: No    Review of Systems  Constitutional: Negative.   HENT: Negative.    Eyes: Negative.   Respiratory: Negative.    Cardiovascular: Negative.   Gastrointestinal: Negative.   Genitourinary: Negative.   Musculoskeletal: Negative.   Skin: Negative.   Neurological: Negative.   Psychiatric/Behavioral: Negative.      Physical Exam BP 128/88   Ht 5\' 2"  (1.575 m)   Wt 258 lb (117 kg)   LMP 04/21/2019   BMI 47.19 kg/m   Physical Exam Constitutional:      General: She is not in acute distress.    Appearance: Normal appearance. She is well-developed.  Genitourinary:     Vulva and bladder normal.     Genitourinary Comments: Exam limited by body habitus     Right Labia: No rash, tenderness, lesions, skin changes or Bartholin's cyst.    Left Labia: No  tenderness, lesions, skin changes, Bartholin's cyst or rash.    No inguinal adenopathy present in the right or left side.    Pelvic Tanner Score: 5/5.    No vaginal discharge, erythema, tenderness or bleeding.      Right Adnexa: not tender, not full and no mass present.    Left Adnexa: not tender, not full and no mass present.    Cervical polyp (small polypoid lesion removed with ring forceps) present.     No cervical motion tenderness, discharge or lesion.        Uterus is not enlarged or tender.     No uterine mass detected.    Pelvic exam was performed with patient in the lithotomy position.  Breasts:    Right: No inverted nipple, mass, nipple discharge, skin change or tenderness.     Left: No inverted nipple, mass, nipple discharge, skin change or tenderness.  HENT:     Head: Normocephalic and atraumatic.  Eyes:     General: No scleral icterus.    Conjunctiva/sclera: Conjunctivae normal.  Neck:     Thyroid: No thyromegaly.  Cardiovascular:     Rate and Rhythm: Normal rate and regular rhythm.     Heart sounds: No murmur heard.   No friction rub. No gallop.  Pulmonary:     Effort: Pulmonary effort is normal. No respiratory distress.     Breath sounds: Normal breath sounds. No wheezing or rales.  Abdominal:     General: Bowel sounds are normal. There is no distension.     Palpations: Abdomen is soft. There is no mass.     Tenderness: There is no abdominal tenderness. There is no guarding or rebound.     Hernia: There is no hernia in the left  inguinal area or right inguinal area.  Musculoskeletal:        General: No swelling or tenderness. Normal range of motion.     Cervical back: Normal range of motion and neck supple.  Lymphadenopathy:     Cervical: No cervical adenopathy.     Lower Body: No right inguinal adenopathy. No left inguinal adenopathy.  Neurological:     General: No focal deficit present.     Mental Status: She is alert and oriented to person, place, and  time.     Cranial Nerves: No cranial nerve deficit.  Skin:    General: Skin is warm and dry.     Findings: No erythema or rash.  Psychiatric:        Mood and Affect: Mood normal.        Behavior: Behavior normal.        Judgment: Judgment normal.    Procedure: Polypectomy Discussed need to remove the polyp as it could grow and cause significant bleeding.  Cervix visualized and polyp noted.  Ring forcep applied to cervix and with twisting motion removed polyp intact.  Hemostasis obtained with silver nitrate. Patient tolerated the procedure well.   Female chaperone present for pelvic and breast  portions of the physical exam  Results: AUDIT Questionnaire (screen for alcoholism): 1 PHQ-9: 3  Assessment: 50 y.o. G0P0000 female here for routine gynecologic examination.  Plan: Problem List Items Addressed This Visit       Genitourinary   Cervical polyp   Relevant Orders   Surgical pathology   Other Visit Diagnoses     Women's annual routine gynecological examination    -  Primary   Relevant Orders   Cytology - PAP   Screening for depression       Screening for alcoholism       Pap smear for cervical cancer screening       Relevant Orders   Cytology - PAP   History of ovarian cyst       Relevant Orders   US PELVIC COMPLETE WITH TRANSVAGINAL       Screening: -- Blood pressure screen managed by PCP -- Colonoscopy - due - managed by PCP -- Mammogram - due - already scheduled at [facility] on end of October -- Weight screening: obese: discussed management options, including lifestyle, dietary, and exercise. -- Depression screening negative (PHQ-9) -- Nutrition: normal -- cholesterol screening: per PCP -- osteoporosis screening: not due -- tobacco screening: not using -- alcohol screening: AUDIT questionnaire indicates low-risk usage. -- family history of breast cancer screening: done. not at high risk. -- no evidence of domestic violence or intimate partner  violence. -- STD screening: gonorrhea/chlamydia NAAT not collected per patient request. -- pap smear collected per ASCCP guidelines  Cervical polyp: removed and sent to pathology.  History of ovarian cyst and symptoms of concern: will obtain a pelvic ultrasound.  Prentice Docker, MD 12/08/2020 1:51 PM

## 2020-12-09 ENCOUNTER — Encounter: Payer: Self-pay | Admitting: Obstetrics and Gynecology

## 2020-12-12 LAB — CYTOLOGY - PAP
Comment: NEGATIVE
Diagnosis: NEGATIVE
High risk HPV: NEGATIVE

## 2020-12-12 LAB — SURGICAL PATHOLOGY

## 2020-12-16 ENCOUNTER — Ambulatory Visit: Payer: 59

## 2020-12-20 ENCOUNTER — Other Ambulatory Visit: Payer: Self-pay | Admitting: Student

## 2020-12-20 ENCOUNTER — Other Ambulatory Visit (HOSPITAL_COMMUNITY): Payer: Self-pay | Admitting: Student

## 2020-12-20 DIAGNOSIS — R1011 Right upper quadrant pain: Secondary | ICD-10-CM

## 2020-12-22 ENCOUNTER — Other Ambulatory Visit: Payer: Self-pay

## 2020-12-22 ENCOUNTER — Ambulatory Visit
Admission: RE | Admit: 2020-12-22 | Discharge: 2020-12-22 | Disposition: A | Discharge status: Home / Self Care | Payer: 59 | Source: Ambulatory Visit | Attending: Student | Admitting: Student

## 2020-12-22 ENCOUNTER — Ambulatory Visit
Admission: RE | Admit: 2020-12-22 | Discharge: 2020-12-22 | Disposition: A | Discharge status: Home / Self Care | Payer: 59 | Source: Ambulatory Visit | Attending: Obstetrics and Gynecology | Admitting: Obstetrics and Gynecology

## 2020-12-22 DIAGNOSIS — R1011 Right upper quadrant pain: Secondary | ICD-10-CM

## 2020-12-22 DIAGNOSIS — Z8742 Personal history of other diseases of the female genital tract: Secondary | ICD-10-CM | POA: Insufficient documentation

## 2020-12-26 ENCOUNTER — Ambulatory Visit
Admission: RE | Admit: 2020-12-26 | Discharge: 2020-12-26 | Disposition: A | Discharge status: Home / Self Care | Payer: 59 | Source: Ambulatory Visit | Attending: Family Medicine | Admitting: Family Medicine

## 2020-12-26 ENCOUNTER — Other Ambulatory Visit: Payer: Self-pay

## 2020-12-26 DIAGNOSIS — Z1231 Encounter for screening mammogram for malignant neoplasm of breast: Secondary | ICD-10-CM | POA: Diagnosis present

## 2020-12-28 ENCOUNTER — Ambulatory Visit: Payer: 59 | Admitting: Obstetrics and Gynecology

## 2021-01-03 ENCOUNTER — Ambulatory Visit: Payer: 59 | Admitting: Family Medicine

## 2021-01-13 ENCOUNTER — Other Ambulatory Visit: Payer: Self-pay

## 2021-01-13 ENCOUNTER — Ambulatory Visit (INDEPENDENT_AMBULATORY_CARE_PROVIDER_SITE_OTHER): Payer: 59

## 2021-01-13 DIAGNOSIS — Z23 Encounter for immunization: Secondary | ICD-10-CM | POA: Diagnosis not present

## 2021-01-23 ENCOUNTER — Ambulatory Visit: Payer: 59 | Admitting: Obstetrics and Gynecology

## 2021-02-07 NOTE — Progress Notes (Deleted)
Name: Kelsey Alvarez   MRN: [id number]    DOB: 10-10-1970   Date:02/07/2021       Progress Note  Subjective  Chief Complaint  Follow Up  HPI  Abnormal EKG: found during COVID-19 visit to Urgent Care, she also had low potassium. She is concerned because mother had heart disease and triple bypass surgery at age 48. She saw Cardiologist and was given reassurance after hold monitor was placed.   DM:  hgbA1C was 7.9%, it went up to 8.6 % , she was unable to tolerate Metformin or GLP-1 agonist , she is on Farxiga and we will add Actos today .She still has neuropathy symptoms ( hands and feet) but doing better on gabapentin. She denies polyphagia, but has noticed polydipsia and polyuria. Explained importance of changing her diet . She has not been taking Crestor ,discussed Good Rx    Morbid Obesity:off Ozempic, she stopped because she could not eat anything, she lost 12 lbs, we tried switching to Rybelsus but only able to tolerate 3 mg dose, her weight is down 8 lbs, and she states having difficulty eating duet o post-prandial peri-umbilical pain. She is willing to go see GI   Periumbilical pain: going on for the past month, pain is described as aching or cramping like and starts with first bite of food and takes 30-60 minutes after a meal to fell better. She states has loose bowel movements but that is normal to her. No blood in stools.    Asthma: she has moderate asthma, she is currently using Treleby and is doing well, has some SOB with strenous activity, but no wheezing. She has occasional cough, she states triggered by strong scent and also when working outside in the heat   Hyperlipidemia: she was on Atorvastatin but LDL was not at goal, we switched to Rosuvastatin but she states not covered by insurance. We will try PA or advised Good Rx   HTN:she denies  side effects of medication. Compliant with medication. She continues to have episodes of palpitations intermittently.   Vertigo: going on  for months, it happens when she rolls in her bed, started a couple of months ago. Sometimes has mild nausea, but no vomiting. She has intermittent tinnutis but not sure what side Discussed ENT referral but she wants to try the [facility] at home .   Patient Active Problem List   Diagnosis Date Noted   Type 2 diabetes, controlled, with peripheral neuropathy ([place]) 01/20/2018   Endometriosis 11/06/2017   Menorrhagia with irregular cycle 10/28/2017   Fibroid uterus 10/28/2017   Carpal tunnel syndrome on both sides 10/17/2017   B12 deficiency 04/27/2016   History of endometriosis 11/17/2015   Oligomenorrhea 10/12/2015   Benign essential HTN 12/28/2014   Cervical polyp 12/28/2014   Controlled type 2 diabetes mellitus with microalbuminuria ([place]) 12/28/2014   Dyslipidemia 12/28/2014   Dysmenorrhea 12/28/2014   Edema 12/28/2014   Female infertility 12/28/2014   Gastro-esophageal reflux disease without esophagitis 12/28/2014   Morbid obesity ([place]) 12/28/2014   Asthma, moderate persistent, poorly-controlled 12/28/2014   Perennial allergic rhinitis 12/28/2014   Intermittent tremor 12/28/2014   Vitamin D deficiency 12/28/2014   Intermittent low back pain 12/28/2014   Left sciatic nerve pain 12/28/2014    Past Surgical History:  Procedure Laterality Date   APPENDECTOMY     cervical polyp removal     DILATION AND CURETTAGE OF UTERUS     HYSTEROSCOPY     HYSTEROSCOPY WITH D & C N/A 10/12/2015  Procedure: DILATATION AND CURETTAGE /HYSTEROSCOPY;  Surgeon: Will Bonnet, MD;  Location: [facility] ORS;  Service: Gynecology;  Laterality: N/A;   LAPAROSCOPIC BILATERAL SALPINGO OOPHERECTOMY N/A 10/12/2015   Procedure: LAPAROSCOPIC RIGHT SALPINGECTOMY, LEFT OOPHORECTOMY;  Surgeon: Will Bonnet, MD;  Location: [facility] ORS;  Service: Gynecology;  Laterality: N/A;   LAPAROSCOPIC OVARIAN CYSTECTOMY Bilateral 10/12/2015   Procedure: LAPAROSCOPIC OVARIAN CYSTECTOMY;  Surgeon: Will Bonnet, MD;  Location:  [facility] ORS;  Service: Gynecology;  Laterality: Bilateral;   OOPHORECTOMY     OVARIAN CYST REMOVAL      Family History  Problem Relation Age of Onset   Hypertension Mother    Diabetes Mother    CAD Mother    Cancer Mother        cervical   Heart attack Mother 87       Triple Bypass   CAD Father    Hypertension Father    Arthritis Father    Thyroid disease Sister    Alzheimer's disease Maternal Grandmother    Alzheimer's disease Maternal Grandfather    Breast cancer Paternal Grandmother     Social History   Tobacco Use   Smoking status: Former    Years: 1.00    Types: Cigarettes    Start date: 12/03/2001    Quit date: 12/28/2002    Years since quitting: 18.1   Smokeless tobacco: Never   Tobacco comments:    smoked 3 to 4 cigarrettes a day for a year  Substance Use Topics   Alcohol use: Yes    Alcohol/week: 0.0 standard drinks    Comment: occasionally drinks margarita, once or twice a year     Current Outpatient Medications:    albuterol (VENTOLIN HFA) 108 (90 Base) MCG/ACT inhaler, Inhale 2 puffs into the lungs every 6 (six) hours as needed for wheezing or shortness of breath., Disp: 18 g, Rfl: 0   Alpha Lipoic Acid 200 MG CAPS, Take by mouth., Disp: , Rfl:    aspirin EC 81 MG tablet, Take 1 tablet (81 mg total) by mouth daily., Disp: 30 tablet, Rfl: 0   Cetirizine HCl (ZYRTEC PO), Take 10 mg by mouth daily., Disp: , Rfl:    Cholecalciferol (VITAMIN D) 2000 UNITS tablet, Take 1 tablet by mouth daily., Disp: , Rfl:    FARXIGA 10 MG TABS tablet, TAKE 1 TABLET BY MOUTH ONCE DAILY BEFORE BREAKFAST, Disp: 90 tablet, Rfl: 0   fluticasone (FLONASE) 50 MCG/ACT nasal spray, Place 2 sprays into both nostrils daily., Disp: 16 g, Rfl: 5   Fluticasone-Umeclidin-Vilant (TRELEGY ELLIPTA) 100-62.5-25 MCG/INH AEPB, Inhale 1 puff into the lungs daily., Disp: 60 each, Rfl: 2   gabapentin (NEURONTIN) 300 MG capsule, Take 300 mg by mouth at bedtime., Disp: , Rfl:    ipratropium-albuterol  (DUONEB) 0.5-2.5 (3) MG/3ML SOLN, Take 3 mLs by nebulization every 4 (four) hours as needed., Disp: 120 mL, Rfl: 0   meloxicam (MOBIC) 15 MG tablet, Take 1 tablet (15 mg total) by mouth daily., Disp: 90 tablet, Rfl: 0   montelukast (SINGULAIR) 10 MG tablet, TAKE 1 TABLET BY MOUTH AT BEDTIME, Disp: 90 tablet, Rfl: 1   olmesartan-hydrochlorothiazide (BENICAR HCT) 20-12.5 MG tablet, TAKE 1 TABLET BY MOUTH ONCE DAILY, Disp: 90 tablet, Rfl: 1   omeprazole (PRILOSEC) 20 MG capsule, TAKE 1 CAPSULE BY MOUTH ONCE DAILY, Disp: 90 capsule, Rfl: 1   pioglitazone (ACTOS) 15 MG tablet, Take 1 tablet (15 mg total) by mouth daily., Disp: 90 tablet, Rfl: 1   rosuvastatin (  CRESTOR) 40 MG tablet, Take 1 tablet (40 mg total) by mouth daily., Disp: 90 tablet, Rfl: 1  Allergies  Allergen Reactions   Fish Allergy Shortness Of Breath   Penicillins Anaphylaxis and Rash    Childhood allergy   Levofloxacin Other (See Comments)    Cramping   Adhesive [Tape] Rash    Paper tape ok to use.    I personally reviewed active problem list, medication list, allergies, family history, social history, health maintenance with the patient/caregiver today.   ROS  ***  Objective  There were no vitals filed for this visit.  There is no height or weight on file to calculate BMI.  Physical Exam ***  Recent Results (from the past 2160 hour(s))  Surgical pathology     Status: None   Collection Time: 12/08/20  1:50 PM  Result Value Ref Range   SURGICAL PATHOLOGY      SURGICAL PATHOLOGY CASE: [id number] PATIENT: [facility] Report     Clinical History: cervical polyp (cm)   FINAL MICROSCOPIC DIAGNOSIS:  A. CERVIX, POLYPECTOMY: -  Benign endocervical polyp -  No malignancy identified   GROSS DESCRIPTION:  Received in formalin is a 1 x 0.6 x 0.3 cm portion of soft tan-red tissue.  The specimen is sectioned and entirely submitted in 1 cassette. [facility] 12/09/2020)   Final Diagnosis  performed by Thressa Sheller, MD.   Electronically signed 12/12/2020 Technical and / or Professional components performed at [facility]. [facility], [place] [address] [place], [place] [address].  Immunohistochemistry Technical component (if applicable) was performed at [facility] [address], [place], [place], [place] [address].   IMMUNOHISTOCHEMISTRY DISCLAIMER (if applicable): Some of these immunohistochemical stains may have been developed and the performance characteristics determine by [facility]. Some may not have been cleared or approved by the U.S. Food and Drug Administration. The FDA has determined that such clearance or approval is not necessary. This test is used for clinical purposes. It should not be regarded as investigational or for research. This laboratory is certified under the [place] (CLIA-88) as qualified to perform high complexity clinical laboratory testing.  The controls stained appropriately.   Cytology - PAP     Status: None   Collection Time: 12/08/20  1:50 PM  Result Value Ref Range   High risk HPV Negative    Adequacy Satisfactory for evaluation.    Diagnosis      - Negative for intraepithelial lesion or malignancy (NILM)   Comment Normal Reference Range HPV - Negative     Diabetic Foot Exam: Diabetic Foot Exam - Simple   No data filed    ***  PHQ2/9: Depression screen [facility] 2/9 10/03/2020 06/03/2020 05/04/2020 02/03/2020 01/22/2020  Decreased Interest 0 0 0 0 0  Down, Depressed, Hopeless 0 0 0 0 0  PHQ - 2 Score 0 0 0 0 0  Altered sleeping - - - 2 2  Tired, decreased energy - - - 1 1  Change in appetite - - - 0 0  Feeling bad or failure about yourself  - - - 0 0  Trouble concentrating - - - 0 0  Moving slowly or fidgety/restless - - - 0 0  Suicidal thoughts - - - 0 0  PHQ-9 Score - - - 3 3  Difficult doing work/chores - - - - Not difficult at all  Some recent data  might be hidden    phq  9 is {gen pos neg:315643}   Fall Risk: Fall Risk  10/03/2020 06/03/2020 05/04/2020 02/03/2020 01/22/2020  Falls in the past year? 0 0 0 0 0  Number falls in past yr: 0 0 0 0 0  Injury with Fall? 0 0 0 0 0  Follow up - - - - -      Functional Status Survey:      Assessment & Plan  *** There are no diagnoses linked to this encounter.

## 2021-02-08 ENCOUNTER — Ambulatory Visit: Payer: 59 | Admitting: Family Medicine

## 2021-02-16 ENCOUNTER — Other Ambulatory Visit: Payer: Self-pay | Admitting: Family Medicine

## 2021-02-16 DIAGNOSIS — J4540 Moderate persistent asthma, uncomplicated: Secondary | ICD-10-CM

## 2021-02-27 ENCOUNTER — Other Ambulatory Visit: Payer: Self-pay | Admitting: Family Medicine

## 2021-02-27 DIAGNOSIS — E1142 Type 2 diabetes mellitus with diabetic polyneuropathy: Secondary | ICD-10-CM

## 2021-03-02 NOTE — Progress Notes (Deleted)
Name: Kelsey Alvarez   MRN: [id number]    DOB: 1970/05/11   Date:03/02/2021       Progress Note  Subjective  Chief Complaint  Follow Up  HPI  Abnormal EKG: found during COVID-19 visit to Urgent Care, she also had low potassium. She is concerned because mother had heart disease and triple bypass surgery at age 48. She saw Cardiologist and was given reassurance after hold monitor was placed.   DM:  hgbA1C was 7.9%, it went up to 8.6 % , she was unable to tolerate Metformin or GLP-1 agonist , she is on Farxiga and we will add Actos today .She still has neuropathy symptoms ( hands and feet) but doing better on gabapentin. She denies polyphagia, but has noticed polydipsia and polyuria. Explained importance of changing her diet . She has not been taking Crestor ,discussed Good Rx    Morbid Obesity:off Ozempic, she stopped because she could not eat anything, she lost 12 lbs, we tried switching to Rybelsus but only able to tolerate 3 mg dose, her weight is down 8 lbs, and she states having difficulty eating duet o post-prandial peri-umbilical pain. She is willing to go see GI   Periumbilical pain: going on for the past month, pain is described as aching or cramping like and starts with first bite of food and takes 30-60 minutes after a meal to fell better. She states has loose bowel movements but that is normal to her. No blood in stools.    Asthma: she has moderate asthma, she is currently using Treleby and is doing well, has some SOB with strenous activity, but no wheezing. She has occasional cough, she states triggered by strong scent and also when working outside in the heat   Hyperlipidemia: she was on Atorvastatin but LDL was not at goal, we switched to Rosuvastatin but she states not covered by insurance. We will try PA or advised Good Rx   HTN:she denies  side effects of medication. Compliant with medication. She continues to have episodes of palpitations intermittently.   Vertigo: going on  for months, it happens when she rolls in her bed, started a couple of months ago. Sometimes has mild nausea, but no vomiting. She has intermittent tinnutis but not sure what side Discussed ENT referral but she wants to try the [facility] at home .   Patient Active Problem List   Diagnosis Date Noted   Type 2 diabetes, controlled, with peripheral neuropathy ([place]) 01/20/2018   Endometriosis 11/06/2017   Menorrhagia with irregular cycle 10/28/2017   Fibroid uterus 10/28/2017   Carpal tunnel syndrome on both sides 10/17/2017   B12 deficiency 04/27/2016   History of endometriosis 11/17/2015   Oligomenorrhea 10/12/2015   Benign essential HTN 12/28/2014   Cervical polyp 12/28/2014   Controlled type 2 diabetes mellitus with microalbuminuria ([place]) 12/28/2014   Dyslipidemia 12/28/2014   Dysmenorrhea 12/28/2014   Edema 12/28/2014   Female infertility 12/28/2014   Gastro-esophageal reflux disease without esophagitis 12/28/2014   Morbid obesity ([place]) 12/28/2014   Asthma, moderate persistent, poorly-controlled 12/28/2014   Perennial allergic rhinitis 12/28/2014   Intermittent tremor 12/28/2014   Vitamin D deficiency 12/28/2014   Intermittent low back pain 12/28/2014   Left sciatic nerve pain 12/28/2014    Past Surgical History:  Procedure Laterality Date   APPENDECTOMY     cervical polyp removal     DILATION AND CURETTAGE OF UTERUS     HYSTEROSCOPY     HYSTEROSCOPY WITH D & C N/A 10/12/2015  Procedure: DILATATION AND CURETTAGE /HYSTEROSCOPY;  Surgeon: Will Bonnet, MD;  Location: [facility] ORS;  Service: Gynecology;  Laterality: N/A;   LAPAROSCOPIC BILATERAL SALPINGO OOPHERECTOMY N/A 10/12/2015   Procedure: LAPAROSCOPIC RIGHT SALPINGECTOMY, LEFT OOPHORECTOMY;  Surgeon: Will Bonnet, MD;  Location: [facility] ORS;  Service: Gynecology;  Laterality: N/A;   LAPAROSCOPIC OVARIAN CYSTECTOMY Bilateral 10/12/2015   Procedure: LAPAROSCOPIC OVARIAN CYSTECTOMY;  Surgeon: Will Bonnet, MD;  Location:  [facility] ORS;  Service: Gynecology;  Laterality: Bilateral;   OOPHORECTOMY     OVARIAN CYST REMOVAL      Family History  Problem Relation Age of Onset   Hypertension Mother    Diabetes Mother    CAD Mother    Cancer Mother        cervical   Heart attack Mother 29       Triple Bypass   CAD Father    Hypertension Father    Arthritis Father    Thyroid disease Sister    Alzheimer's disease Maternal Grandmother    Alzheimer's disease Maternal Grandfather    Breast cancer Paternal Grandmother     Social History   Tobacco Use   Smoking status: Former    Years: 1.00    Types: Cigarettes    Start date: 12/03/2001    Quit date: 12/28/2002    Years since quitting: 18.1   Smokeless tobacco: Never   Tobacco comments:    smoked 3 to 4 cigarrettes a day for a year  Substance Use Topics   Alcohol use: Yes    Alcohol/week: 0.0 standard drinks    Comment: occasionally drinks margarita, once or twice a year     Current Outpatient Medications:    albuterol (VENTOLIN HFA) 108 (90 Base) MCG/ACT inhaler, Inhale 2 puffs into the lungs every 6 (six) hours as needed for wheezing or shortness of breath., Disp: 18 g, Rfl: 0   Alpha Lipoic Acid 200 MG CAPS, Take by mouth., Disp: , Rfl:    aspirin EC 81 MG tablet, Take 1 tablet (81 mg total) by mouth daily., Disp: 30 tablet, Rfl: 0   Cetirizine HCl (ZYRTEC PO), Take 10 mg by mouth daily., Disp: , Rfl:    Cholecalciferol (VITAMIN D) 2000 UNITS tablet, Take 1 tablet by mouth daily., Disp: , Rfl:    FARXIGA 10 MG TABS tablet, TAKE 1 TABLET BY MOUTH ONCE DAILY BEFORE BREAKFAST, Disp: 90 tablet, Rfl: 0   fluticasone (FLONASE) 50 MCG/ACT nasal spray, Place 2 sprays into both nostrils daily., Disp: 16 g, Rfl: 5   Fluticasone-Umeclidin-Vilant (TRELEGY ELLIPTA) 100-62.5-25 MCG/INH AEPB, Inhale 1 puff into the lungs daily., Disp: 60 each, Rfl: 2   gabapentin (NEURONTIN) 300 MG capsule, Take 300 mg by mouth at bedtime., Disp: , Rfl:    ipratropium-albuterol  (DUONEB) 0.5-2.5 (3) MG/3ML SOLN, Take 3 mLs by nebulization every 4 (four) hours as needed., Disp: 120 mL, Rfl: 0   meloxicam (MOBIC) 15 MG tablet, Take 1 tablet (15 mg total) by mouth daily., Disp: 90 tablet, Rfl: 0   montelukast (SINGULAIR) 10 MG tablet, TAKE 1 TABLET BY MOUTH AT BEDTIME, Disp: 90 tablet, Rfl: 1   olmesartan-hydrochlorothiazide (BENICAR HCT) 20-12.5 MG tablet, TAKE 1 TABLET BY MOUTH ONCE DAILY, Disp: 90 tablet, Rfl: 1   omeprazole (PRILOSEC) 20 MG capsule, TAKE 1 CAPSULE BY MOUTH ONCE DAILY, Disp: 90 capsule, Rfl: 1   pioglitazone (ACTOS) 15 MG tablet, Take 1 tablet (15 mg total) by mouth daily., Disp: 90 tablet, Rfl: 1   rosuvastatin (  CRESTOR) 40 MG tablet, Take 1 tablet (40 mg total) by mouth daily., Disp: 90 tablet, Rfl: 1  Allergies  Allergen Reactions   Fish Allergy Shortness Of Breath   Penicillins Anaphylaxis and Rash    Childhood allergy   Levofloxacin Other (See Comments)    Cramping   Adhesive [Tape] Rash    Paper tape ok to use.    I personally reviewed active problem list, medication list, allergies, family history, social history, health maintenance with the patient/caregiver today.   ROS  ***  Objective  There were no vitals filed for this visit.  There is no height or weight on file to calculate BMI.  Physical Exam ***  Recent Results (from the past 2160 hour(s))  Surgical pathology     Status: None   Collection Time: 12/08/20  1:50 PM  Result Value Ref Range   SURGICAL PATHOLOGY      SURGICAL PATHOLOGY CASE: [id number] PATIENT: [facility] Pathology Report     Clinical History: cervical polyp (cm)   FINAL MICROSCOPIC DIAGNOSIS:  A. CERVIX, POLYPECTOMY: -  Benign endocervical polyp -  No malignancy identified   GROSS DESCRIPTION:  Received in formalin is a 1 x 0.6 x 0.3 cm portion of soft tan-red tissue.  The specimen is sectioned and entirely submitted in 1 cassette. [facility] 12/09/2020)   Final Diagnosis  performed by Thressa Sheller, MD.   Electronically signed 12/12/2020 Technical and / or Professional components performed at [facility]. [facility], [place] [address] [place], [place] [address].  Immunohistochemistry Technical component (if applicable) was performed at [facility]. [address] [place], [place], [place] [address].   IMMUNOHISTOCHEMISTRY DISCLAIMER (if applicable): Some of these immunohistochemical stains may have been developed and the performance characteristics determine by [facility]. Some may not have been cleared or approved by the U.S. Food and Drug Administration. The FDA has determined that such clearance or approval is not necessary. This test is used for clinical purposes. It should not be regarded as investigational or for research. This laboratory is certified under the [place] (CLIA-88) as qualified to perform high complexity clinical laboratory testing.  The controls stained appropriately.   Cytology - PAP     Status: None   Collection Time: 12/08/20  1:50 PM  Result Value Ref Range   High risk HPV Negative    Adequacy Satisfactory for evaluation.    Diagnosis      - Negative for intraepithelial lesion or malignancy (NILM)   Comment Normal Reference Range HPV - Negative     Diabetic Foot Exam: Diabetic Foot Exam - Simple   No data filed    ***  PHQ2/9: Depression screen [facility] 2/9 10/03/2020 06/03/2020 05/04/2020 02/03/2020 01/22/2020  Decreased Interest 0 0 0 0 0  Down, Depressed, Hopeless 0 0 0 0 0  PHQ - 2 Score 0 0 0 0 0  Altered sleeping - - - 2 2  Tired, decreased energy - - - 1 1  Change in appetite - - - 0 0  Feeling bad or failure about yourself  - - - 0 0  Trouble concentrating - - - 0 0  Moving slowly or fidgety/restless - - - 0 0  Suicidal thoughts - - - 0 0  PHQ-9 Score - - - 3 3  Difficult doing work/chores - - - - Not difficult at all  Some recent data  might be hidden    phq  9 is {gen pos neg:315643}   Fall Risk: Fall Risk  10/03/2020 06/03/2020 05/04/2020 02/03/2020 01/22/2020  Falls in the past year? 0 0 0 0 0  Number falls in past yr: 0 0 0 0 0  Injury with Fall? 0 0 0 0 0  Follow up - - - - -      Functional Status Survey:      Assessment & Plan  *** There are no diagnoses linked to this encounter.

## 2021-03-03 ENCOUNTER — Ambulatory Visit: Payer: 59 | Admitting: Family Medicine

## 2021-03-12 ENCOUNTER — Other Ambulatory Visit: Payer: Self-pay | Admitting: Family Medicine

## 2021-03-12 DIAGNOSIS — J4540 Moderate persistent asthma, uncomplicated: Secondary | ICD-10-CM

## 2021-03-12 NOTE — Telephone Encounter (Signed)
Requested Prescriptions  Pending Prescriptions Disp Refills   montelukast (SINGULAIR) 10 MG tablet [Pharmacy Med Name: MONTELUKAST SOD 10 MG TABLET] 90 tablet 1    Sig: TAKE 1 TABLET BY MOUTH EVERYDAY AT BEDTIME     Pulmonology:  Leukotriene Inhibitors Passed - 03/12/2021  8:58 AM      Passed - Valid encounter within last 12 months    Recent Outpatient Visits          5 months ago Type 2 diabetes, controlled, with peripheral neuropathy [facility])   [facility] Steele Sizer, MD   9 months ago Well adult exam   [facility] Steele Sizer, MD   10 months ago Type 2 diabetes, controlled, with peripheral neuropathy [facility])   [facility] Steele Sizer, MD   1 year ago Type 2 diabetes, controlled, with peripheral neuropathy [facility])   [facility] Steele Sizer, MD   1 year ago Benign essential HTN   [facility] Steele Sizer, MD      Future Appointments            In 2 days Steele Sizer, MD [facility]

## 2021-03-13 NOTE — Progress Notes (Signed)
Name: Kelsey Alvarez   MRN: [id number]    DOB: Nov 06, 1970   Date:03/14/2021       Progress Note  Subjective  Chief Complaint  Follow Up  HPI  Abnormal EKG: found during COVID-19 visit to Urgent Care, she also had low potassium. She is concerned because mother had heart disease and triple bypass surgery at age 49. She saw Cardiologist and was given reassurance after hold monitor was placed. Unchanged   DM:  hgbA1C was 7.9%, it went up to 8.6 % ,today is down to 6.3 %.  She was unable to tolerate Metformin or GLP-1 agonist , she is on [place] and started on Actos Fall 2022 .She still has neuropathy symptoms ( hands and feet) but doing better on gabapentin. She denies polyphagia, polydipsia or polyuria . She is also taking Crestor since she came in last time    Morbid Obesity:off Ozempic, she stopped because she could not eat anything, she lost 12 lbs, we tried switching to Rybelsus but only able to tolerate 3 mg dose, her weight went down 8 lbs but also unable to tolerate medication. Discussed increasing physical activity   Periumbilical pain: going on for over 4 months  pain is described as aching or cramping like and starts with first bite of food and takes 30-60 minutes after a meal to fell better. She states has loose bowel movements but that is normal to her. No blood in stools. Discussed importance of going to see GI, she states she will contact them directly    Asthma: she has moderate asthma, she is currently using [place] and is doing well, has some SOB with strenous activity, but no wheezing. She has occasional cough, she states triggered by strong scent and also when working outside in the heat or cold weather    Hyperlipidemia: she was on Atorvastatin but LDL was not at goal, we switched to Rosuvastatin and she has been compliant now and we will recheck labs    HTN:she denies  side effects of medication. Compliant with medication but also expensive, we will try switching to valsartan  hctz and see if cost goes down  Vertigo: going on for months, it happens when she rolls in her bed, started a couple of months ago. Sometimes has mild nausea, but no vomiting. She has intermittent tinnutis but not sure what side Discussed ENT referral but she wants to try the [facility] at home .   Low back pain : used to be intermittent across lower back, but over the past 6 weeks pain has been constant worse when standing still, radiating to left lateral thigh , described as pain and numbness on thigh Denies bowel or bladder incontinence.   Patient Active Problem List   Diagnosis Date Noted   Type 2 diabetes, controlled, with peripheral neuropathy ([place]) 01/20/2018   Endometriosis 11/06/2017   Menorrhagia with irregular cycle 10/28/2017   Fibroid uterus 10/28/2017   Carpal tunnel syndrome on both sides 10/17/2017   B12 deficiency 04/27/2016   History of endometriosis 11/17/2015   Oligomenorrhea 10/12/2015   Benign essential HTN 12/28/2014   Cervical polyp 12/28/2014   Controlled type 2 diabetes mellitus with microalbuminuria ([place]) 12/28/2014   Dyslipidemia 12/28/2014   Dysmenorrhea 12/28/2014   Edema 12/28/2014   Female infertility 12/28/2014   Gastro-esophageal reflux disease without esophagitis 12/28/2014   Morbid obesity ([place]) 12/28/2014   Asthma, moderate persistent, poorly-controlled 12/28/2014   Perennial allergic rhinitis 12/28/2014   Intermittent tremor 12/28/2014   Vitamin D deficiency 12/28/2014  Intermittent low back pain 12/28/2014   Left sciatic nerve pain 12/28/2014    Past Surgical History:  Procedure Laterality Date   APPENDECTOMY     cervical polyp removal     DILATION AND CURETTAGE OF UTERUS     HYSTEROSCOPY     HYSTEROSCOPY WITH D & C N/A 10/12/2015   Procedure: DILATATION AND CURETTAGE /HYSTEROSCOPY;  Surgeon: Will Bonnet, MD;  Location: [facility] ORS;  Service: Gynecology;  Laterality: N/A;   LAPAROSCOPIC BILATERAL SALPINGO OOPHERECTOMY N/A 10/12/2015    Procedure: LAPAROSCOPIC RIGHT SALPINGECTOMY, LEFT OOPHORECTOMY;  Surgeon: Will Bonnet, MD;  Location: [facility] OR[facility];  Service: Gynecology;  Laterality: N/A;   LAPAROSCOPIC OVARIAN CYSTECTOMY Bilateral 10/12/2015   Procedure: LAPAROSCOPIC OVARIAN CYSTECTOMY;  Surgeon: Will Bonnet, MD;  Location: [facility] ORS;  Service: Gynecology;  Laterality: Bilateral;   OOPHORECTOMY     OVARIAN CYST REMOVAL      Family History  Problem Relation Age of Onset   Hypertension Mother    Diabetes Mother    CAD Mother    Cancer Mother        cervical   Heart attack Mother 58       Triple Bypass   CAD Father    Hypertension Father    Arthritis Father    Thyroid disease Sister    Alzheimer's disease Maternal Grandmother    Alzheimer's disease Maternal Grandfather    Breast cancer Paternal Grandmother     Social History   Tobacco Use   Smoking status: Former    Years: 1.00    Types: Cigarettes    Start date: 12/03/2001    Quit date: 12/28/2002    Years since quitting: 18.2   Smokeless tobacco: Never   Tobacco comments:    smoked 3 to 4 cigarrettes a day for a year  Substance Use Topics   Alcohol use: Yes    Alcohol/week: 0.0 standard drinks    Comment: occasionally drinks margarita, once or twice a year     Current Outpatient Medications:    albuterol (VENTOLIN HFA) 108 (90 Base) MCG/ACT inhaler, Inhale 2 puffs into the lungs every 6 (six) hours as needed for wheezing or shortness of breath., Disp: 18 g, Rfl: 0   Alpha Lipoic Acid 200 MG CAPS, Take by mouth., Disp: , Rfl:    aspirin EC 81 MG tablet, Take 1 tablet (81 mg total) by mouth daily., Disp: 30 tablet, Rfl: 0   Cetirizine HCl (ZYRTEC PO), Take 10 mg by mouth daily., Disp: , Rfl:    Cholecalciferol (VITAMIN D) 2000 UNITS tablet, Take 1 tablet by mouth daily., Disp: , Rfl:    FARXIGA 10 MG TABS tablet, TAKE 1 TABLET BY MOUTH ONCE DAILY BEFORE BREAKFAST, Disp: 90 tablet, Rfl: 0   fluticasone (FLONASE) 50 MCG/ACT nasal spray, Place 2  sprays into both nostrils daily., Disp: 16 g, Rfl: 5   gabapentin (NEURONTIN) 300 MG capsule, Take 300 mg by mouth at bedtime., Disp: , Rfl:    ipratropium-albuterol (DUONEB) 0.5-2.5 (3) MG/3ML SOLN, Take 3 mLs by nebulization every 4 (four) hours as needed., Disp: 120 mL, Rfl: 0   meloxicam (MOBIC) 15 MG tablet, Take 1 tablet (15 mg total) by mouth daily., Disp: 90 tablet, Rfl: 0   montelukast (SINGULAIR) 10 MG tablet, TAKE 1 TABLET BY MOUTH EVERYDAY AT BEDTIME, Disp: 90 tablet, Rfl: 1   omeprazole (PRILOSEC) 20 MG capsule, TAKE 1 CAPSULE BY MOUTH ONCE DAILY, Disp: 90 capsule, Rfl: 1   pioglitazone (ACTOS)  15 MG tablet, Take 1 tablet (15 mg total) by mouth daily., Disp: 90 tablet, Rfl: 1   rosuvastatin (CRESTOR) 40 MG tablet, Take 1 tablet (40 mg total) by mouth daily., Disp: 90 tablet, Rfl: 1  Allergies  Allergen Reactions   Fish Allergy Shortness Of Breath   Penicillins Anaphylaxis and Rash    Childhood allergy   Levofloxacin Other (See Comments)    Cramping   Adhesive [Tape] Rash    Paper tape ok to use.    I personally reviewed active problem list, medication list, allergies, family history, social history, health maintenance with the patient/caregiver today.   ROS  Constitutional: Negative for fever , positive for  weight change.  Respiratory: positive for intermittent cough and shortness of breath.   Cardiovascular: Negative for chest pain or palpitations.  Gastrointestinal: Negative for abdominal pain, no bowel changes.  Musculoskeletal: Negative for gait problem or joint swelling.  Skin: Negative for rash.  Neurological: Negative for dizziness or headache.  No other specific complaints in a complete review of systems (except as listed in HPI above).   Objective  Vitals:   03/14/21 1003  BP: 130/86  Pulse: 71  Resp: 16  Temp: 97.7 F (36.5 C)  TempSrc: Oral  SpO2: 98%  Weight: 265 lb 9.6 oz (120.5 kg)  Height: 5\' 2"  (1.575 m)    Body mass index is 48.58  kg/m.  Physical Exam  Constitutional: Patient appears well-developed and well-nourished. Obese  No distress.  HEENT: head atraumatic, normocephalic, pupils equal and reactive to light, ears , neck supple Cardiovascular: Normal rate, regular rhythm and normal heart sounds.  No murmur heard. No BLE edema. Pulmonary/Chest: Effort normal and breath sounds normal. No respiratory distress. Abdominal: Soft.  There is no tenderness. Psychiatric: Patient has a normal mood and affect. behavior is normal. Judgment and thought content normal.   Recent Results (from the past 2160 hour(s))  POCT HgB A1C     Status: Abnormal   Collection Time: 03/14/21 10:08 AM  Result Value Ref Range   Hemoglobin A1C 6.3 (A) 4.0 - 5.6 %   HbA1c POC (<> result, manual entry)     HbA1c, POC (prediabetic range)     HbA1c, POC (controlled diabetic range)      Diabetic Foot Exam: Diabetic Foot Exam - Simple   Simple Foot Form Diabetic Foot exam was performed with the following findings: Yes 03/14/2021 10:47 AM  Visual Inspection No deformities, no ulcerations, no other skin breakdown bilaterally: Yes Sensation Testing See comments: Yes Pulse Check Posterior Tibialis and Dorsalis pulse intact bilaterally: Yes Comments      PHQ2/9: Depression screen [facility] 2/9 03/14/2021 10/03/2020 06/03/2020 05/04/2020 02/03/2020  Decreased Interest 0 0 0 0 0  Down, Depressed, Hopeless 0 0 0 0 0  PHQ - 2 Score 0 0 0 0 0  Altered sleeping 0 - - - 2  Tired, decreased energy 0 - - - 1  Change in appetite 0 - - - 0  Feeling bad or failure about yourself  0 - - - 0  Trouble concentrating 0 - - - 0  Moving slowly or fidgety/restless 0 - - - 0  Suicidal thoughts 0 - - - 0  PHQ-9 Score 0 - - - 3  Difficult doing work/chores - - - - -  Some recent data might be hidden    phq 9 is negative   Fall Risk: Fall Risk  03/14/2021 10/03/2020 06/03/2020 05/04/2020 02/03/2020  Falls in the past year?  0 0 0 0 0  Number falls in past yr: - 0 0 0 0   Injury with Fall? - 0 0 0 0  Follow up Falls prevention discussed - - - -      Functional Status Survey: Is the patient deaf or have difficulty hearing?: No Does the patient have difficulty seeing, even when wearing glasses/contacts?: No Does the patient have difficulty concentrating, remembering, or making decisions?: No Does the patient have difficulty walking or climbing stairs?: Yes Does the patient have difficulty dressing or bathing?: No Does the patient have difficulty doing errands alone such as visiting a doctor's office or shopping?: No    Assessment & Plan  1. Type 2 diabetes, controlled, with peripheral neuropathy (HCC)  - POCT HgB A1C - HM Diabetes Foot Exam - Microalbumin / creatinine urine ratio - dapagliflozin propanediol (FARXIGA) 10 MG TABS tablet; Take 1 tablet (10 mg total) by mouth daily before breakfast.  Dispense: 90 tablet; Refill: 0  2. Controlled type 2 diabetes mellitus with microalbuminuria, without long-term current use of insulin (HCC)  - Microalbumin / creatinine urine ratio  3. B12 deficiency  - Vitamin B12 - CBC with Differential/Platelet  4. Colon cancer screening  She will contact GI, referral already placed  5. Morbid obesity ([place])  Discussed with the patient the risk posed by an increased BMI. Discussed importance of portion control, calorie counting and at least 150 minutes of physical activity weekly. Avoid sweet beverages and drink more water. Eat at least 6 servings of fruit and vegetables daily    6. Gastro-esophageal reflux disease without esophagitis   7. Vitamin D deficiency  - VITAMIN D 25 Hydroxy (Vit-D Deficiency, Fractures)  8. Benign essential HTN  - CBC with Differential/Platelet - Comprehensive metabolic panel - valsartan-hydrochlorothiazide (DIOVAN-HCT) 160-12.5 MG tablet; Take 1 tablet by mouth daily.  Dispense: 90 tablet; Refill: 1  9. Dyslipidemia associated with type 2 diabetes mellitus (HCC)  - Lipid  panel - pioglitazone (ACTOS) 15 MG tablet; Take 1 tablet (15 mg total) by mouth daily.  Dispense: 90 tablet; Refill: 1 - rosuvastatin (CRESTOR) 40 MG tablet; Take 1 tablet (40 mg total) by mouth daily.  Dispense: 90 tablet; Refill: 1  10. Perennial allergic rhinitis   11. Asthma, moderate persistent, well-controlled  - Fluticasone-Umeclidin-Vilant (TRELEGY ELLIPTA) 100-62.5-25 MCG/ACT AEPB; Inhale 1 puff into the lungs daily.  Dispense: 1 each; Refill: 3  12. Lumbar back pain with radiculopathy affecting left lower extremity   Resume Gabapentin, take meloxicam for one week and add baclofen up to BID but suggested at night only for now. Needs to do home exercises since PT too costly

## 2021-03-14 ENCOUNTER — Encounter: Payer: Self-pay | Admitting: Family Medicine

## 2021-03-14 ENCOUNTER — Ambulatory Visit: Payer: 59 | Admitting: Family Medicine

## 2021-03-14 VITALS — BP 130/86 | HR 71 | Temp 97.7°F | Resp 16 | Ht 62.0 in | Wt 265.6 lb

## 2021-03-14 DIAGNOSIS — E1129 Type 2 diabetes mellitus with other diabetic kidney complication: Secondary | ICD-10-CM

## 2021-03-14 DIAGNOSIS — E538 Deficiency of other specified B group vitamins: Secondary | ICD-10-CM

## 2021-03-14 DIAGNOSIS — E1142 Type 2 diabetes mellitus with diabetic polyneuropathy: Secondary | ICD-10-CM | POA: Diagnosis not present

## 2021-03-14 DIAGNOSIS — J3089 Other allergic rhinitis: Secondary | ICD-10-CM

## 2021-03-14 DIAGNOSIS — J4540 Moderate persistent asthma, uncomplicated: Secondary | ICD-10-CM

## 2021-03-14 DIAGNOSIS — E1169 Type 2 diabetes mellitus with other specified complication: Secondary | ICD-10-CM

## 2021-03-14 DIAGNOSIS — E785 Hyperlipidemia, unspecified: Secondary | ICD-10-CM

## 2021-03-14 DIAGNOSIS — R809 Proteinuria, unspecified: Secondary | ICD-10-CM

## 2021-03-14 DIAGNOSIS — M5416 Radiculopathy, lumbar region: Secondary | ICD-10-CM

## 2021-03-14 DIAGNOSIS — K219 Gastro-esophageal reflux disease without esophagitis: Secondary | ICD-10-CM

## 2021-03-14 DIAGNOSIS — Z1211 Encounter for screening for malignant neoplasm of colon: Secondary | ICD-10-CM | POA: Diagnosis not present

## 2021-03-14 DIAGNOSIS — I10 Essential (primary) hypertension: Secondary | ICD-10-CM

## 2021-03-14 DIAGNOSIS — E559 Vitamin D deficiency, unspecified: Secondary | ICD-10-CM

## 2021-03-14 LAB — POCT GLYCOSYLATED HEMOGLOBIN (HGB A1C): Hemoglobin A1C: 6.3 % — AB (ref 4.0–5.6)

## 2021-03-14 MED ORDER — TRELEGY ELLIPTA 100-62.5-25 MCG/ACT IN AEPB: 1.0000 | INHALATION_SPRAY | Freq: Every day | RESPIRATORY_TRACT | 3 refills | Status: DC

## 2021-03-14 MED ORDER — ROSUVASTATIN CALCIUM 40 MG PO TABS: 40.0000 mg | ORAL_TABLET | Freq: Every day | ORAL | 1 refills | Status: DC

## 2021-03-14 MED ORDER — VALSARTAN-HYDROCHLOROTHIAZIDE 160-12.5 MG PO TABS: 1.0000 | ORAL_TABLET | Freq: Every day | ORAL | 1 refills | Status: DC

## 2021-03-14 MED ORDER — BACLOFEN 10 MG PO TABS
10.0000 mg | ORAL_TABLET | Freq: Two times a day (BID) | ORAL | 0 refills | Status: DC
Start: 2021-03-14 — End: 2022-03-09

## 2021-03-14 MED ORDER — PIOGLITAZONE HCL 15 MG PO TABS: 15.0000 mg | ORAL_TABLET | Freq: Every day | ORAL | 1 refills | Status: DC

## 2021-03-14 MED ORDER — DAPAGLIFLOZIN PROPANEDIOL 10 MG PO TABS: 10.0000 mg | ORAL_TABLET | Freq: Every day | ORAL | 0 refills | Status: DC

## 2021-03-15 LAB — CBC WITH DIFFERENTIAL/PLATELET
Basophils Absolute: 0.1 10*3/uL (ref 0.0–0.2)
Basos: 1 %
EOS (ABSOLUTE): 0.2 10*3/uL (ref 0.0–0.4)
Eos: 2 %
Hematocrit: 42.3 % (ref 34.0–46.6)
Hemoglobin: 14.7 g/dL (ref 11.1–15.9)
Immature Grans (Abs): 0 10*3/uL (ref 0.0–0.1)
Immature Granulocytes: 0 %
Lymphocytes Absolute: 2.7 10*3/uL (ref 0.7–3.1)
Lymphs: 35 %
MCH: 27.1 pg (ref 26.6–33.0)
MCHC: 34.8 g/dL (ref 31.5–35.7)
MCV: 78 fL — ABNORMAL LOW (ref 79–97)
Monocytes Absolute: 0.5 10*3/uL (ref 0.1–0.9)
Monocytes: 6 %
Neutrophils Absolute: 4.3 10*3/uL (ref 1.4–7.0)
Neutrophils: 56 %
Platelets: 276 10*3/uL (ref 150–450)
RBC: 5.43 x10E6/uL — ABNORMAL HIGH (ref 3.77–5.28)
RDW: 13 % (ref 11.7–15.4)
WBC: 7.7 10*3/uL (ref 3.4–10.8)

## 2021-03-15 LAB — COMPREHENSIVE METABOLIC PANEL
ALT: 12 IU/L (ref 0–32)
AST: 13 IU/L (ref 0–40)
Albumin/Globulin Ratio: 1.5 (ref 1.2–2.2)
Albumin: 4.3 g/dL (ref 3.8–4.8)
Alkaline Phosphatase: 74 IU/L (ref 44–121)
BUN/Creatinine Ratio: 16 (ref 9–23)
BUN: 12 mg/dL (ref 6–24)
Bilirubin Total: 0.2 mg/dL (ref 0.0–1.2)
CO2: 24 mmol/L (ref 20–29)
Calcium: 9.3 mg/dL (ref 8.7–10.2)
Chloride: 101 mmol/L (ref 96–106)
Creatinine, Ser: 0.74 mg/dL (ref 0.57–1.00)
Globulin, Total: 2.9 g/dL (ref 1.5–4.5)
Glucose: 108 mg/dL — ABNORMAL HIGH (ref 70–99)
Potassium: 4.5 mmol/L (ref 3.5–5.2)
Sodium: 140 mmol/L (ref 134–144)
Total Protein: 7.2 g/dL (ref 6.0–8.5)
eGFR: 100 mL/min/{1.73_m2} (ref >59)

## 2021-03-15 LAB — LIPID PANEL
Chol/HDL Ratio: 5.4 ratio — ABNORMAL HIGH (ref 0.0–4.4)
Cholesterol, Total: 228 mg/dL — ABNORMAL HIGH (ref 100–199)
HDL: 42 mg/dL (ref >39)
LDL Chol Calc (NIH): 139 mg/dL — ABNORMAL HIGH (ref 0–99)
Triglycerides: 258 mg/dL — ABNORMAL HIGH (ref 0–149)
VLDL Cholesterol Cal: 47 mg/dL — ABNORMAL HIGH (ref 5–40)

## 2021-03-15 LAB — VITAMIN D 25 HYDROXY (VIT D DEFICIENCY, FRACTURES): Vit D, 25-Hydroxy: 33.4 ng/mL (ref 30.0–100.0)

## 2021-03-15 LAB — MICROALBUMIN / CREATININE URINE RATIO
Creatinine, Urine: 57.7 mg/dL
Microalb/Creat Ratio: <5 mg/g creat (ref 0–29)
Microalbumin, Urine: <3 ug/mL

## 2021-03-15 LAB — VITAMIN B12: Vitamin B-12: 721 pg/mL (ref 232–1245)

## 2021-03-17 ENCOUNTER — Telehealth: Payer: 59 | Admitting: Physician Assistant

## 2021-03-17 DIAGNOSIS — B9689 Other specified bacterial agents as the cause of diseases classified elsewhere: Secondary | ICD-10-CM | POA: Diagnosis not present

## 2021-03-17 DIAGNOSIS — J019 Acute sinusitis, unspecified: Secondary | ICD-10-CM

## 2021-03-17 MED ORDER — DOXYCYCLINE HYCLATE 100 MG PO TABS: 100.0000 mg | ORAL_TABLET | Freq: Two times a day (BID) | ORAL | 0 refills | Status: DC

## 2021-03-17 MED ORDER — BENZONATATE 100 MG PO CAPS
100.0000 mg | ORAL_CAPSULE | Freq: Three times a day (TID) | ORAL | 0 refills | Status: DC | PRN
Start: 2021-03-17 — End: 2021-06-19

## 2021-03-17 NOTE — Patient Instructions (Signed)
Kelsey Alvarez, thank you for joining Mar Daring, PA-C for today's virtual visit.  While this provider is not your primary care provider (PCP), if your PCP is located in our provider database this encounter information will be shared with them immediately following your visit.  Consent: (Patient) Kelsey Alvarez provided verbal consent for this virtual visit at the beginning of the encounter.  Current Medications:  Current Outpatient Medications:    benzonatate (TESSALON) 100 MG capsule, Take 1 capsule (100 mg total) by mouth 3 (three) times daily as needed., Disp: 30 capsule, Rfl: 0   doxycycline (VIBRA-TABS) 100 MG tablet, Take 1 tablet (100 mg total) by mouth 2 (two) times daily., Disp: 20 tablet, Rfl: 0   albuterol (VENTOLIN HFA) 108 (90 Base) MCG/ACT inhaler, Inhale 2 puffs into the lungs every 6 (six) hours as needed for wheezing or shortness of breath., Disp: 18 g, Rfl: 0   Alpha Lipoic Acid 200 MG CAPS, Take by mouth., Disp: , Rfl:    aspirin EC 81 MG tablet, Take 1 tablet (81 mg total) by mouth daily., Disp: 30 tablet, Rfl: 0   baclofen (LIORESAL) 10 MG tablet, Take 1 tablet (10 mg total) by mouth 2 (two) times daily., Disp: 60 each, Rfl: 0   Cetirizine HCl (ZYRTEC PO), Take 10 mg by mouth daily., Disp: , Rfl:    Cholecalciferol (VITAMIN D) 2000 UNITS tablet, Take 1 tablet by mouth daily., Disp: , Rfl:    dapagliflozin propanediol (FARXIGA) 10 MG TABS tablet, Take 1 tablet (10 mg total) by mouth daily before breakfast., Disp: 90 tablet, Rfl: 0   fluticasone (FLONASE) 50 MCG/ACT nasal spray, Place 2 sprays into both nostrils daily., Disp: 16 g, Rfl: 5   Fluticasone-Umeclidin-Vilant (TRELEGY ELLIPTA) 100-62.5-25 MCG/ACT AEPB, Inhale 1 puff into the lungs daily., Disp: 1 each, Rfl: 3   gabapentin (NEURONTIN) 300 MG capsule, Take 300 mg by mouth at bedtime., Disp: , Rfl:    ipratropium-albuterol (DUONEB) 0.5-2.5 (3) MG/3ML SOLN, Take 3 mLs by nebulization every 4 (four) hours as  needed., Disp: 120 mL, Rfl: 0   meloxicam (MOBIC) 15 MG tablet, Take 1 tablet (15 mg total) by mouth daily., Disp: 90 tablet, Rfl: 0   montelukast (SINGULAIR) 10 MG tablet, TAKE 1 TABLET BY MOUTH EVERYDAY AT BEDTIME, Disp: 90 tablet, Rfl: 1   omeprazole (PRILOSEC) 20 MG capsule, TAKE 1 CAPSULE BY MOUTH ONCE DAILY, Disp: 90 capsule, Rfl: 1   pioglitazone (ACTOS) 15 MG tablet, Take 1 tablet (15 mg total) by mouth daily., Disp: 90 tablet, Rfl: 1   rosuvastatin (CRESTOR) 40 MG tablet, Take 1 tablet (40 mg total) by mouth daily., Disp: 90 tablet, Rfl: 1   valsartan-hydrochlorothiazide (DIOVAN-HCT) 160-12.5 MG tablet, Take 1 tablet by mouth daily., Disp: 90 tablet, Rfl: 1   Medications ordered in this encounter:  Meds ordered this encounter  Medications   doxycycline (VIBRA-TABS) 100 MG tablet    Sig: Take 1 tablet (100 mg total) by mouth 2 (two) times daily.    Dispense:  20 tablet    Refill:  0    Order Specific Question:   Supervising Provider    Answer:   MILLER, BRIAN [[id number]]   benzonatate (TESSALON) 100 MG capsule    Sig: Take 1 capsule (100 mg total) by mouth 3 (three) times daily as needed.    Dispense:  30 capsule    Refill:  0    Order Specific Question:   Supervising Provider    Answer:  [employer], [place]     *If you need refills on other medications prior to your next appointment, please contact your pharmacy*  Follow-Up: Call back or seek an in-person evaluation if the symptoms worsen or if the condition fails to improve as anticipated.  Other Instructions Sinusitis, Adult Sinusitis is inflammation of your sinuses. Sinuses are hollow spaces in the bones around your face. Your sinuses are located: Around your eyes. In the middle of your forehead. Behind your nose. In your cheekbones. Mucus normally drains out of your sinuses. When your nasal tissues become inflamed or swollen, mucus can become trapped or blocked. This allows bacteria, viruses, and fungi to grow, which  leads to infection. Most infections of the sinuses are caused by a virus. Sinusitis can develop quickly. It can last for up to 4 weeks (acute) or for more than 12 weeks (chronic). Sinusitis often develops after a cold. What are the causes? This condition is caused by anything that creates swelling in the sinuses or stops mucus from draining. This includes: Allergies. Asthma. Infection from bacteria or viruses. Deformities or blockages in your nose or sinuses. Abnormal growths in the nose (nasal polyps). Pollutants, such as chemicals or irritants in the air. Infection from fungi (rare). What increases the risk? You are more likely to develop this condition if you: Have a weak body defense system (immune system). Do a lot of swimming or diving. Overuse nasal sprays. Smoke. What are the signs or symptoms? The main symptoms of this condition are pain and a feeling of pressure around the affected sinuses. Other symptoms include: Stuffy nose or congestion. Thick drainage from your nose. Swelling and warmth over the affected sinuses. Headache. Upper toothache. A cough that may get worse at night. Extra mucus that collects in the throat or the back of the nose (postnasal drip). Decreased sense of smell and taste. Fatigue. A fever. Sore throat. Bad breath. How is this diagnosed? This condition is diagnosed based on: Your symptoms. Your medical history. A physical exam. Tests to find out if your condition is acute or chronic. This may include: Checking your nose for nasal polyps. Viewing your sinuses using a device that has a light (endoscope). Testing for allergies or bacteria. Imaging tests, such as an MRI or CT scan. In rare cases, a bone biopsy may be done to rule out more serious types of fungal sinus disease. How is this treated? Treatment for sinusitis depends on the cause and whether your condition is chronic or acute. If caused by a virus, your symptoms should go away on  their own within 10 days. You may be given medicines to relieve symptoms. They include: Medicines that shrink swollen nasal passages (topical intranasal decongestants). Medicines that treat allergies (antihistamines). A spray that eases inflammation of the nostrils (topical intranasal corticosteroids). Rinses that help get rid of thick mucus in your nose (nasal saline washes). If caused by bacteria, your health care provider may recommend waiting to see if your symptoms improve. Most bacterial infections will get better without antibiotic medicine. You may be given antibiotics if you have: A severe infection. A weak immune system. If caused by narrow nasal passages or nasal polyps, you may need to have surgery. Follow these instructions at home: Medicines Take, use, or apply over-the-counter and prescription medicines only as told by your health care provider. These may include nasal sprays. If you were prescribed an antibiotic medicine, take it as told by your health care provider. Do not stop taking the antibiotic even  if you start to feel better. Hydrate and humidify  Drink enough fluid to keep your urine pale yellow. Staying hydrated will help to thin your mucus. Use a cool mist humidifier to keep the humidity level in your home above 50%. Inhale steam for 10-15 minutes, 3-4 times a day, or as told by your health care provider. You can do this in the bathroom while a hot shower is running. Limit your exposure to cool or dry air. Rest Rest as much as possible. Sleep with your head raised (elevated). Make sure you get enough sleep each night. General instructions  Apply a warm, moist washcloth to your face 3-4 times a day or as told by your health care provider. This will help with discomfort. Wash your hands often with soap and water to reduce your exposure to germs. If soap and water are not available, use hand sanitizer. Do not smoke. Avoid being around people who are smoking  (secondhand smoke). Keep all follow-up visits as told by your health care provider. This is important. Contact a health care provider if: You have a fever. Your symptoms get worse. Your symptoms do not improve within 10 days. Get help right away if: You have a severe headache. You have persistent vomiting. You have severe pain or swelling around your face or eyes. You have vision problems. You develop confusion. Your neck is stiff. You have trouble breathing. Summary Sinusitis is soreness and inflammation of your sinuses. Sinuses are hollow spaces in the bones around your face. This condition is caused by nasal tissues that become inflamed or swollen. The swelling traps or blocks the flow of mucus. This allows bacteria, viruses, and fungi to grow, which leads to infection. If you were prescribed an antibiotic medicine, take it as told by your health care provider. Do not stop taking the antibiotic even if you start to feel better. Keep all follow-up visits as told by your health care provider. This is important. This information is not intended to replace advice given to you by your health care provider. Make sure you discuss any questions you have with your health care provider. Document Revised: 07/22/2017 Document Reviewed: 07/22/2017 Elsevier Patient Education  2022 [employer].    If you have been instructed to have an in-person evaluation today at a local Urgent Care facility, please use the link below. It will take you to a list of all of our available [facility], including address, phone number and hours of operation. Please do not delay care.  [facility]  If you or a family member do not have a primary care provider, use the link below to schedule a visit and establish care. When you choose a [facility] primary care physician or advanced practice provider, you gain a long-term partner in health. Find a Primary Care Provider  Learn more about [facility]'s in-office and virtual care options: [facility]

## 2021-03-17 NOTE — Progress Notes (Signed)
Virtual Visit Consent   [name], you are scheduled for a virtual visit with a [facility] provider today.     Just as with appointments in the office, your consent must be obtained to participate.  Your consent will be active for this visit and any virtual visit you may have with one of our providers in the next 365 days.     If you have a MyChart account, a copy of this consent can be sent to you electronically.  All virtual visits are billed to your insurance company just like a traditional visit in the office.    As this is a virtual visit, video technology does not allow for your provider to perform a traditional examination.  This may limit your provider's ability to fully assess your condition.  If your provider identifies any concerns that need to be evaluated in person or the need to arrange testing (such as labs, EKG, etc.), we will make arrangements to do so.     Although advances in technology are sophisticated, we cannot ensure that it will always work on either your end or our end.  If the connection with a video visit is poor, the visit may have to be switched to a telephone visit.  With either a video or telephone visit, we are not always able to ensure that we have a secure connection.     I need to obtain your verbal consent now.   Are you willing to proceed with your visit today?    Kelsey Alvarez has provided verbal consent on 03/17/2021 for a virtual visit (video or telephone).   Mar Daring, PA-C   Date: 03/17/2021 5:40 PM   Virtual Visit via Video Note   I, Mar Daring, connected with  Kelsey Alvarez  ([id number], 1970/10/09) on 03/17/21 at  6:00 PM EST by a video-enabled telemedicine application and verified that I am speaking with the correct person using two identifiers.  Location: Patient: Virtual Visit Location Patient: Home Provider: Virtual Visit Location Provider: Home Office   I discussed the limitations of evaluation and management  by telemedicine and the availability of in person appointments. The patient expressed understanding and agreed to proceed.    History of Present Illness: Kelsey Alvarez is a 51 y.o. who identifies as a female who was assigned female at birth, and is being seen today for possible sinus infection.  HPI: Sinusitis This is a new problem. The current episode started in the past 7 days (Started with vertigo). The problem has been gradually worsening since onset. There has been no fever. The pain is moderate. Associated symptoms include chills, congestion, coughing, ear pain (left), headaches and sinus pressure. Pertinent negatives include no shortness of breath or sore throat. Past treatments include acetaminophen and spray decongestants (fluticasone and tylenol and advil). The treatment provided mild relief.   At home covid test is negative  Problems:  Patient Active Problem List   Diagnosis Date Noted   Type 2 diabetes, controlled, with peripheral neuropathy ([place]) 01/20/2018   Endometriosis 11/06/2017   Menorrhagia with irregular cycle 10/28/2017   Fibroid uterus 10/28/2017   Carpal tunnel syndrome on both sides 10/17/2017   B12 deficiency 04/27/2016   History of endometriosis 11/17/2015   Oligomenorrhea 10/12/2015   Benign essential HTN 12/28/2014   Cervical polyp 12/28/2014   Controlled type 2 diabetes mellitus with microalbuminuria ([place]) 12/28/2014   Dyslipidemia 12/28/2014   Dysmenorrhea 12/28/2014   Edema 12/28/2014   Female infertility 12/28/2014  Gastro-esophageal reflux disease without esophagitis 12/28/2014   Morbid obesity ([place]) 12/28/2014   Asthma, moderate persistent, poorly-controlled 12/28/2014   Perennial allergic rhinitis 12/28/2014   Intermittent tremor 12/28/2014   Vitamin D deficiency 12/28/2014   Intermittent low back pain 12/28/2014   Left sciatic nerve pain 12/28/2014    Allergies:  Allergies  Allergen Reactions   Fish Allergy Shortness Of Breath    Penicillins Anaphylaxis and Rash    Childhood allergy   Levofloxacin Other (See Comments)    Cramping   Adhesive [Tape] Rash    Paper tape ok to use.   Medications:  Current Outpatient Medications:    albuterol (VENTOLIN HFA) 108 (90 Base) MCG/ACT inhaler, Inhale 2 puffs into the lungs every 6 (six) hours as needed for wheezing or shortness of breath., Disp: 18 g, Rfl: 0   Alpha Lipoic Acid 200 MG CAPS, Take by mouth., Disp: , Rfl:    aspirin EC 81 MG tablet, Take 1 tablet (81 mg total) by mouth daily., Disp: 30 tablet, Rfl: 0   baclofen (LIORESAL) 10 MG tablet, Take 1 tablet (10 mg total) by mouth 2 (two) times daily., Disp: 60 each, Rfl: 0   Cetirizine HCl (ZYRTEC PO), Take 10 mg by mouth daily., Disp: , Rfl:    Cholecalciferol (VITAMIN D) 2000 UNITS tablet, Take 1 tablet by mouth daily., Disp: , Rfl:    dapagliflozin propanediol (FARXIGA) 10 MG TABS tablet, Take 1 tablet (10 mg total) by mouth daily before breakfast., Disp: 90 tablet, Rfl: 0   fluticasone (FLONASE) 50 MCG/ACT nasal spray, Place 2 sprays into both nostrils daily., Disp: 16 g, Rfl: 5   Fluticasone-Umeclidin-Vilant (TRELEGY ELLIPTA) 100-62.5-25 MCG/ACT AEPB, Inhale 1 puff into the lungs daily., Disp: 1 each, Rfl: 3   gabapentin (NEURONTIN) 300 MG capsule, Take 300 mg by mouth at bedtime., Disp: , Rfl:    ipratropium-albuterol (DUONEB) 0.5-2.5 (3) MG/3ML SOLN, Take 3 mLs by nebulization every 4 (four) hours as needed., Disp: 120 mL, Rfl: 0   meloxicam (MOBIC) 15 MG tablet, Take 1 tablet (15 mg total) by mouth daily., Disp: 90 tablet, Rfl: 0   montelukast (SINGULAIR) 10 MG tablet, TAKE 1 TABLET BY MOUTH EVERYDAY AT BEDTIME, Disp: 90 tablet, Rfl: 1   omeprazole (PRILOSEC) 20 MG capsule, TAKE 1 CAPSULE BY MOUTH ONCE DAILY, Disp: 90 capsule, Rfl: 1   pioglitazone (ACTOS) 15 MG tablet, Take 1 tablet (15 mg total) by mouth daily., Disp: 90 tablet, Rfl: 1   rosuvastatin (CRESTOR) 40 MG tablet, Take 1 tablet (40 mg total) by mouth  daily., Disp: 90 tablet, Rfl: 1   valsartan-hydrochlorothiazide (DIOVAN-HCT) 160-12.5 MG tablet, Take 1 tablet by mouth daily., Disp: 90 tablet, Rfl: 1  Observations/Objective: Patient is well-developed, well-nourished in no acute distress.  Resting comfortably at home.  Head is normocephalic, atraumatic.  No labored breathing.  Speech is clear and coherent with logical content.  Patient is alert and oriented at baseline.    Assessment and Plan: 1. Acute bacterial sinusitis  - Worsening symptoms that have not responded to OTC medications.  - Will give Doxycycline - Tessalon for cough - Discussed prednisone, but will hold off for now since patient is diabetic.  - Advised to give 3-4 days of antibiotic and call back if she feels not improving enough to need steroid - Continue allergy medications.  - Stay well hydrated and get plenty of rest.  - Seek in person evaluation if no symptom improvement or if symptoms worsen.   Follow  Up Instructions: I discussed the assessment and treatment plan with the patient. The patient was provided an opportunity to ask questions and all were answered. The patient agreed with the plan and demonstrated an understanding of the instructions.  A copy of instructions were sent to the patient via MyChart unless otherwise noted below.   The patient was advised to call back or seek an in-person evaluation if the symptoms worsen or if the condition fails to improve as anticipated.  Time:  I spent 12 minutes with the patient via telehealth technology discussing the above problems/concerns.    Mar Daring, PA-C

## 2021-04-12 ENCOUNTER — Other Ambulatory Visit: Payer: Self-pay | Admitting: Family Medicine

## 2021-04-12 DIAGNOSIS — J4540 Moderate persistent asthma, uncomplicated: Secondary | ICD-10-CM

## 2021-04-12 MED ORDER — ALBUTEROL SULFATE HFA 108 (90 BASE) MCG/ACT IN AERS: 2.0000 | INHALATION_SPRAY | Freq: Four times a day (QID) | RESPIRATORY_TRACT | 0 refills | Status: DC | PRN

## 2021-04-25 ENCOUNTER — Telehealth: Payer: 59 | Admitting: Physician Assistant

## 2021-04-25 DIAGNOSIS — R3989 Other symptoms and signs involving the genitourinary system: Secondary | ICD-10-CM

## 2021-04-25 MED ORDER — SULFAMETHOXAZOLE-TRIMETHOPRIM 800-160 MG PO TABS: 1.0000 | ORAL_TABLET | Freq: Two times a day (BID) | ORAL | 0 refills | Status: DC

## 2021-04-25 NOTE — Progress Notes (Signed)
Virtual Visit Consent   [name], you are scheduled for a virtual visit with a [facility] provider today.     Just as with appointments in the office, your consent must be obtained to participate.  Your consent will be active for this visit and any virtual visit you may have with one of our providers in the next 365 days.     If you have a MyChart account, a copy of this consent can be sent to you electronically.  All virtual visits are billed to your insurance company just like a traditional visit in the office.    As this is a virtual visit, video technology does not allow for your provider to perform a traditional examination.  This may limit your provider's ability to fully assess your condition.  If your provider identifies any concerns that need to be evaluated in person or the need to arrange testing (such as labs, EKG, etc.), we will make arrangements to do so.     Although advances in technology are sophisticated, we cannot ensure that it will always work on either your end or our end.  If the connection with a video visit is poor, the visit may have to be switched to a telephone visit.  With either a video or telephone visit, we are not always able to ensure that we have a secure connection.     I need to obtain your verbal consent now.   Are you willing to proceed with your visit today?    Shantee Hayne has provided verbal consent on 04/25/2021 for a virtual visit (video or telephone).   Mar Daring, PA-C   Date: 04/25/2021 2:42 PM   Virtual Visit via Video Note   I, Mar Daring, connected with  [name]  ([id number], 06-19-1970) on 04/25/21 at  3:00 PM EST by a video-enabled telemedicine application and verified that I am speaking with the correct person using two identifiers.  Location: Patient: Virtual Visit Location Patient: Home Provider: Virtual Visit Location Provider: Home Office   I discussed the limitations of evaluation and management  by telemedicine and the availability of in person appointments. The patient expressed understanding and agreed to proceed.    History of Present Illness: Kelsey Alvarez is a 51 y.o. who identifies as a female who was assigned female at birth, and is being seen today for possible UTI.  HPI: Urinary Tract Infection  This is a new problem. The current episode started in the past 7 days (4 days ago). The problem occurs every urination. The problem has been gradually worsening. The quality of the pain is described as aching and burning (suprapubic pain). The pain is mild. There has been no fever. Associated symptoms include chills, frequency, hesitancy and urgency. Pertinent negatives include no discharge, flank pain, hematuria, nausea or vomiting. She has tried increased fluids, NSAIDs and acetaminophen (AZO tablets) for the symptoms. The treatment provided no relief. There is no history of catheterization, kidney stones, recurrent UTIs or a urological procedure.     Problems:  Patient Active Problem List   Diagnosis Date Noted   Type 2 diabetes, controlled, with peripheral neuropathy ([place]) 01/20/2018   Endometriosis 11/06/2017   Menorrhagia with irregular cycle 10/28/2017   Fibroid uterus 10/28/2017   Carpal tunnel syndrome on both sides 10/17/2017   B12 deficiency 04/27/2016   History of endometriosis 11/17/2015   Oligomenorrhea 10/12/2015   Benign essential HTN 12/28/2014   Cervical polyp 12/28/2014   Controlled type 2 diabetes  mellitus with microalbuminuria ([place]) 12/28/2014   Dyslipidemia 12/28/2014   Dysmenorrhea 12/28/2014   Edema 12/28/2014   Female infertility 12/28/2014   Gastro-esophageal reflux disease without esophagitis 12/28/2014   Morbid obesity ([place]) 12/28/2014   Asthma, moderate persistent, poorly-controlled 12/28/2014   Perennial allergic rhinitis 12/28/2014   Intermittent tremor 12/28/2014   Vitamin D deficiency 12/28/2014   Intermittent low back pain 12/28/2014    Left sciatic nerve pain 12/28/2014    Allergies:  Allergies  Allergen Reactions   Fish Allergy Shortness Of Breath   Penicillins Anaphylaxis and Rash    Childhood allergy   Levofloxacin Other (See Comments)    Cramping   Adhesive [Tape] Rash    Paper tape ok to use.   Medications:  Current Outpatient Medications:    sulfamethoxazole-trimethoprim (BACTRIM DS) 800-160 MG tablet, Take 1 tablet by mouth 2 (two) times daily., Disp: 10 tablet, Rfl: 0   albuterol (VENTOLIN HFA) 108 (90 Base) MCG/ACT inhaler, Inhale 2 puffs into the lungs every 6 (six) hours as needed for wheezing or shortness of breath., Disp: 18 g, Rfl: 0   Alpha Lipoic Acid 200 MG CAPS, Take by mouth., Disp: , Rfl:    aspirin EC 81 MG tablet, Take 1 tablet (81 mg total) by mouth daily., Disp: 30 tablet, Rfl: 0   baclofen (LIORESAL) 10 MG tablet, Take 1 tablet (10 mg total) by mouth 2 (two) times daily., Disp: 60 each, Rfl: 0   benzonatate (TESSALON) 100 MG capsule, Take 1 capsule (100 mg total) by mouth 3 (three) times daily as needed., Disp: 30 capsule, Rfl: 0   Cetirizine HCl (ZYRTEC PO), Take 10 mg by mouth daily., Disp: , Rfl:    Cholecalciferol (VITAMIN D) 2000 UNITS tablet, Take 1 tablet by mouth daily., Disp: , Rfl:    dapagliflozin propanediol (FARXIGA) 10 MG TABS tablet, Take 1 tablet (10 mg total) by mouth daily before breakfast., Disp: 90 tablet, Rfl: 0   fluticasone (FLONASE) 50 MCG/ACT nasal spray, Place 2 sprays into both nostrils daily., Disp: 16 g, Rfl: 5   Fluticasone-Umeclidin-Vilant (TRELEGY ELLIPTA) 100-62.5-25 MCG/ACT AEPB, Inhale 1 puff into the lungs daily., Disp: 1 each, Rfl: 3   gabapentin (NEURONTIN) 300 MG capsule, Take 300 mg by mouth at bedtime., Disp: , Rfl:    ipratropium-albuterol (DUONEB) 0.5-2.5 (3) MG/3ML SOLN, Take 3 mLs by nebulization every 4 (four) hours as needed., Disp: 120 mL, Rfl: 0   meloxicam (MOBIC) 15 MG tablet, Take 1 tablet (15 mg total) by mouth daily., Disp: 90 tablet, Rfl:  0   montelukast (SINGULAIR) 10 MG tablet, TAKE 1 TABLET BY MOUTH EVERYDAY AT BEDTIME, Disp: 90 tablet, Rfl: 1   omeprazole (PRILOSEC) 20 MG capsule, TAKE 1 CAPSULE BY MOUTH ONCE DAILY, Disp: 90 capsule, Rfl: 1   pioglitazone (ACTOS) 15 MG tablet, Take 1 tablet (15 mg total) by mouth daily., Disp: 90 tablet, Rfl: 1   rosuvastatin (CRESTOR) 40 MG tablet, Take 1 tablet (40 mg total) by mouth daily., Disp: 90 tablet, Rfl: 1   valsartan-hydrochlorothiazide (DIOVAN-HCT) 160-12.5 MG tablet, Take 1 tablet by mouth daily., Disp: 90 tablet, Rfl: 1  Observations/Objective: Patient is well-developed, well-nourished in no acute distress.  Resting comfortably at home.  Head is normocephalic, atraumatic.  No labored breathing.  Speech is clear and coherent with logical content.  Patient is alert and oriented at baseline.    Assessment and Plan: 1. Suspected UTI - sulfamethoxazole-trimethoprim (BACTRIM DS) 800-160 MG tablet; Take 1 tablet by mouth 2 (two)  times daily.  Dispense: 10 tablet; Refill: 0  - Worsening symptoms.  - Will treat empirically with Bactrim  - Continue to push fluids.  - She is to seek in person evaluation if symptoms do not improve or if they worsen.    Follow Up Instructions: I discussed the assessment and treatment plan with the patient. The patient was provided an opportunity to ask questions and all were answered. The patient agreed with the plan and demonstrated an understanding of the instructions.  A copy of instructions were sent to the patient via MyChart unless otherwise noted below.   The patient was advised to call back or seek an in-person evaluation if the symptoms worsen or if the condition fails to improve as anticipated.  Time:  I spent 10 minutes with the patient via telehealth technology discussing the above problems/concerns.    Mar Daring, PA-C

## 2021-04-25 NOTE — Patient Instructions (Signed)
[name], thank you for joining Mar Daring, PA-C for today's virtual visit.  While this provider is not your primary care provider (PCP), if your PCP is located in our provider database this encounter information will be shared with them immediately following your visit.  Consent: (Patient) Kelsey Alvarez provided verbal consent for this virtual visit at the beginning of the encounter.  Current Medications:  Current Outpatient Medications:    sulfamethoxazole-trimethoprim (BACTRIM DS) 800-160 MG tablet, Take 1 tablet by mouth 2 (two) times daily., Disp: 10 tablet, Rfl: 0   albuterol (VENTOLIN HFA) 108 (90 Base) MCG/ACT inhaler, Inhale 2 puffs into the lungs every 6 (six) hours as needed for wheezing or shortness of breath., Disp: 18 g, Rfl: 0   Alpha Lipoic Acid 200 MG CAPS, Take by mouth., Disp: , Rfl:    aspirin EC 81 MG tablet, Take 1 tablet (81 mg total) by mouth daily., Disp: 30 tablet, Rfl: 0   baclofen (LIORESAL) 10 MG tablet, Take 1 tablet (10 mg total) by mouth 2 (two) times daily., Disp: 60 each, Rfl: 0   benzonatate (TESSALON) 100 MG capsule, Take 1 capsule (100 mg total) by mouth 3 (three) times daily as needed., Disp: 30 capsule, Rfl: 0   Cetirizine HCl (ZYRTEC PO), Take 10 mg by mouth daily., Disp: , Rfl:    Cholecalciferol (VITAMIN D) 2000 UNITS tablet, Take 1 tablet by mouth daily., Disp: , Rfl:    dapagliflozin propanediol (FARXIGA) 10 MG TABS tablet, Take 1 tablet (10 mg total) by mouth daily before breakfast., Disp: 90 tablet, Rfl: 0   fluticasone (FLONASE) 50 MCG/ACT nasal spray, Place 2 sprays into both nostrils daily., Disp: 16 g, Rfl: 5   Fluticasone-Umeclidin-Vilant (TRELEGY ELLIPTA) 100-62.5-25 MCG/ACT AEPB, Inhale 1 puff into the lungs daily., Disp: 1 each, Rfl: 3   gabapentin (NEURONTIN) 300 MG capsule, Take 300 mg by mouth at bedtime., Disp: , Rfl:    ipratropium-albuterol (DUONEB) 0.5-2.5 (3) MG/3ML SOLN, Take 3 mLs by nebulization every 4 (four)  hours as needed., Disp: 120 mL, Rfl: 0   meloxicam (MOBIC) 15 MG tablet, Take 1 tablet (15 mg total) by mouth daily., Disp: 90 tablet, Rfl: 0   montelukast (SINGULAIR) 10 MG tablet, TAKE 1 TABLET BY MOUTH EVERYDAY AT BEDTIME, Disp: 90 tablet, Rfl: 1   omeprazole (PRILOSEC) 20 MG capsule, TAKE 1 CAPSULE BY MOUTH ONCE DAILY, Disp: 90 capsule, Rfl: 1   pioglitazone (ACTOS) 15 MG tablet, Take 1 tablet (15 mg total) by mouth daily., Disp: 90 tablet, Rfl: 1   rosuvastatin (CRESTOR) 40 MG tablet, Take 1 tablet (40 mg total) by mouth daily., Disp: 90 tablet, Rfl: 1   valsartan-hydrochlorothiazide (DIOVAN-HCT) 160-12.5 MG tablet, Take 1 tablet by mouth daily., Disp: 90 tablet, Rfl: 1   Medications ordered in this encounter:  Meds ordered this encounter  Medications   sulfamethoxazole-trimethoprim (BACTRIM DS) 800-160 MG tablet    Sig: Take 1 tablet by mouth 2 (two) times daily.    Dispense:  10 tablet    Refill:  0    Order Specific Question:   Supervising Provider    Answer:   Sabra Heck, BRIAN [[id number]]     *If you need refills on other medications prior to your next appointment, please contact your pharmacy*  Follow-Up: Call back or seek an in-person evaluation if the symptoms worsen or if the condition fails to improve as anticipated.  Other Instructions Urinary Tract Infection, Adult A urinary tract infection (UTI) is an infection of  any part of the urinary tract. The urinary tract includes the kidneys, ureters, bladder, and urethra. These organs make, store, and get rid of urine in the body. An upper UTI affects the ureters and kidneys. A lower UTI affects the bladder and urethra. What are the causes? Most urinary tract infections are caused by bacteria in your genital area around your urethra, where urine leaves your body. These bacteria grow and cause inflammation of your urinary tract. What increases the risk? You are more likely to develop this condition if: You have a urinary catheter that  stays in place. You are not able to control when you urinate or have a bowel movement (incontinence). You are female and you: Use a spermicide or diaphragm for birth control. Have low estrogen levels. Are pregnant. You have certain genes that increase your risk. You are sexually active. You take antibiotic medicines. You have a condition that causes your flow of urine to slow down, such as: An enlarged prostate, if you are female. Blockage in your urethra. A kidney stone. A nerve condition that affects your bladder control (neurogenic bladder). Not getting enough to drink, or not urinating often. You have certain medical conditions, such as: Diabetes. A weak disease-fighting system (immunesystem). Sickle cell disease. Gout. Spinal cord injury. What are the signs or symptoms? Symptoms of this condition include: Needing to urinate right away (urgency). Frequent urination. This may include small amounts of urine each time you urinate. Pain or burning with urination. Blood in the urine. Urine that smells bad or unusual. Trouble urinating. Cloudy urine. Vaginal discharge, if you are female. Pain in the abdomen or the lower back. You may also have: Vomiting or a decreased appetite. Confusion. Irritability or tiredness. A fever or chills. Diarrhea. The first symptom in older adults may be confusion. In some cases, they may not have any symptoms until the infection has worsened. How is this diagnosed? This condition is diagnosed based on your medical history and a physical exam. You may also have other tests, including: Urine tests. Blood tests. Tests for STIs (sexually transmitted infections). If you have had more than one UTI, a cystoscopy or imaging studies may be done to determine the cause of the infections. How is this treated? Treatment for this condition includes: Antibiotic medicine. Over-the-counter medicines to treat discomfort. Drinking enough water to stay  hydrated. If you have frequent infections or have other conditions such as a kidney stone, you may need to see a health care provider who specializes in the urinary tract (urologist). In rare cases, urinary tract infections can cause sepsis. Sepsis is a life-threatening condition that occurs when the body responds to an infection. Sepsis is treated in the hospital with IV antibiotics, fluids, and other medicines. Follow these instructions at home: Medicines Take over-the-counter and prescription medicines only as told by your health care provider. If you were prescribed an antibiotic medicine, take it as told by your health care provider. Do not stop using the antibiotic even if you start to feel better. General instructions Make sure you: Empty your bladder often and completely. Do not hold urine for long periods of time. Empty your bladder after sex. Wipe from front to back after urinating or having a bowel movement if you are female. Use each tissue only one time when you wipe. Drink enough fluid to keep your urine pale yellow. Keep all follow-up visits. This is important. Contact a health care provider if: Your symptoms do not get better after 1-2 days. Your symptoms  go away and then return. Get help right away if: You have severe pain in your back or your lower abdomen. You have a fever or chills. You have nausea or vomiting. Summary A urinary tract infection (UTI) is an infection of any part of the urinary tract, which includes the kidneys, ureters, bladder, and urethra. Most urinary tract infections are caused by bacteria in your genital area. Treatment for this condition often includes antibiotic medicines. If you were prescribed an antibiotic medicine, take it as told by your health care provider. Do not stop using the antibiotic even if you start to feel better. Keep all follow-up visits. This is important. This information is not intended to replace advice given to you by your  health care provider. Make sure you discuss any questions you have with your health care provider. Document Revised: 10/02/2019 Document Reviewed: 10/02/2019 Elsevier Patient Education  2022 [employer].    If you have been instructed to have an in-person evaluation today at a local Urgent Care facility, please use the link below. It will take you to a list of all of our available [facility], including address, phone number and hours of operation. Please do not delay care.  [facility]  If you or a family member do not have a primary care provider, use the link below to schedule a visit and establish care. When you choose a [facility] primary care physician or advanced practice provider, you gain a long-term partner in health. Find a Primary Care Provider  Learn more about [facility]'s in-office and virtual care options: [facility]

## 2021-05-15 ENCOUNTER — Other Ambulatory Visit: Payer: Self-pay | Admitting: Family Medicine

## 2021-05-15 DIAGNOSIS — J4540 Moderate persistent asthma, uncomplicated: Secondary | ICD-10-CM

## 2021-05-26 ENCOUNTER — Telehealth: Payer: 59 | Admitting: Physician Assistant

## 2021-05-26 DIAGNOSIS — B9689 Other specified bacterial agents as the cause of diseases classified elsewhere: Secondary | ICD-10-CM

## 2021-05-26 DIAGNOSIS — R110 Nausea: Secondary | ICD-10-CM | POA: Diagnosis not present

## 2021-05-26 DIAGNOSIS — J019 Acute sinusitis, unspecified: Secondary | ICD-10-CM | POA: Diagnosis not present

## 2021-05-26 MED ORDER — LEVOFLOXACIN 500 MG PO TABS: 500.0000 mg | ORAL_TABLET | Freq: Every day | ORAL | 0 refills | Status: AC

## 2021-05-26 MED ORDER — ONDANSETRON HCL 4 MG PO TABS: 4.0000 mg | ORAL_TABLET | Freq: Three times a day (TID) | ORAL | 0 refills | Status: DC | PRN

## 2021-05-26 NOTE — Progress Notes (Signed)
?Virtual Visit Consent  ? ?Kelsey Alvarez, you are scheduled for a virtual visit with a [facility] provider today.   ?  ?Just as with appointments in the office, your consent must be obtained to participate.  Your consent will be active for this visit and any virtual visit you may have with one of our providers in the next 365 days.   ?  ?If you have a MyChart account, a copy of this consent can be sent to you electronically.  All virtual visits are billed to your insurance company just like a traditional visit in the office.   ? ?As this is a virtual visit, video technology does not allow for your provider to perform a traditional examination.  This may limit your provider's ability to fully assess your condition.  If your provider identifies any concerns that need to be evaluated in person or the need to arrange testing (such as labs, EKG, etc.), we will make arrangements to do so.   ?  ?Although advances in technology are sophisticated, we cannot ensure that it will always work on either your end or our end.  If the connection with a video visit is poor, the visit may have to be switched to a telephone visit.  With either a video or telephone visit, we are not always able to ensure that we have a secure connection.    ? ?I need to obtain your verbal consent now.   Are you willing to proceed with your visit today?  ?  ?Kelsey Alvarez has provided verbal consent on 05/26/2021 for a virtual visit (video or telephone). ?  ?Kelsey Daring, PA-C  ? ?Date: 05/26/2021 1:53 PM ? ? ?Virtual Visit via Video Note  ? ?IMar Alvarez, connected with  Kelsey Alvarez  ([id number], [birth year]) on 05/26/21 at  1:45 PM EDT by a video-enabled telemedicine application and verified that I am speaking with the correct person using two identifiers. ? ?Location: ?Patient: Virtual Visit Location Patient: Home ?Provider: Virtual Visit Location Provider: Home Office ?  ?I discussed the limitations of evaluation and management  by telemedicine and the availability of in person appointments. The patient expressed understanding and agreed to proceed.   ? ?History of Present Illness: ?Kelsey Alvarez is a 51 y.o. who identifies as a female who was assigned female at birth, and is being seen today for possible sinus infection. ? ?HPI: Sinusitis ?This is a recurrent problem. The current episode started 1 to 4 weeks ago (Seen on 05/07/21 at [facility] and started Doxycycline and prednisone; reports no true change in symptoms or improvement). The problem has been gradually worsening since onset. There has been no fever. The pain is moderate. Associated symptoms include congestion, ear pain (left), headaches and sinus pressure. Pertinent negatives include no chills, coughing, hoarse voice or sore throat. (Rhinorrhea (early morning), post nasal drainage) Treatments tried: doxycycline, zyrtec, singulair, flonase, alka seltzer plus sinus. The treatment provided no relief.   ? ? ?Problems:  ?Patient Active Problem List  ? Diagnosis Date Noted  ? Type 2 diabetes, controlled, with peripheral neuropathy ([place]) 01/20/2018  ? Endometriosis 11/06/2017  ? Menorrhagia with irregular cycle 10/28/2017  ? Fibroid uterus 10/28/2017  ? Carpal tunnel syndrome on both sides 10/17/2017  ? B12 deficiency 04/27/2016  ? History of endometriosis 11/17/2015  ? Oligomenorrhea 10/12/2015  ? Benign essential HTN 12/28/2014  ? Cervical polyp 12/28/2014  ? Controlled type 2 diabetes mellitus with microalbuminuria ([place]) 12/28/2014  ? Dyslipidemia 12/28/2014  ?  Dysmenorrhea 12/28/2014  ? Edema 12/28/2014  ? Female infertility 12/28/2014  ? Gastro-esophageal reflux disease without esophagitis 12/28/2014  ? Morbid obesity ([place]) 12/28/2014  ? Asthma, moderate persistent, poorly-controlled 12/28/2014  ? Perennial allergic rhinitis 12/28/2014  ? Intermittent tremor 12/28/2014  ? Vitamin D deficiency 12/28/2014  ? Intermittent low back pain 12/28/2014  ? Left sciatic nerve pain 12/28/2014  ?   ?Allergies:  ?Allergies  ?Allergen Reactions  ? Fish Allergy Shortness Of Breath  ? Penicillins Anaphylaxis and Rash  ?  Childhood allergy  ? Levofloxacin Other (See Comments)  ?  Cramping  ? Adhesive [Tape] Rash  ?  Paper tape ok to use.  ? ?Medications:  ?Current Outpatient Medications:  ?  levofloxacin (LEVAQUIN) 500 MG tablet, Take 1 tablet (500 mg total) by mouth daily for 7 days., Disp: 7 tablet, Rfl: 0 ?  ondansetron (ZOFRAN) 4 MG tablet, Take 1 tablet (4 mg total) by mouth every 8 (eight) hours as needed for nausea or vomiting., Disp: 20 tablet, Rfl: 0 ?  albuterol (VENTOLIN HFA) 108 (90 Base) MCG/ACT inhaler, TAKE 2 PUFFS BY MOUTH EVERY 6 HOURS AS NEEDED FOR WHEEZE OR SHORTNESS OF BREATH, Disp: 8.5 each, Rfl: 0 ?  Alpha Lipoic Acid 200 MG CAPS, Take by mouth., Disp: , Rfl:  ?  aspirin EC 81 MG tablet, Take 1 tablet (81 mg total) by mouth daily., Disp: 30 tablet, Rfl: 0 ?  baclofen (LIORESAL) 10 MG tablet, Take 1 tablet (10 mg total) by mouth 2 (two) times daily., Disp: 60 each, Rfl: 0 ?  benzonatate (TESSALON) 100 MG capsule, Take 1 capsule (100 mg total) by mouth 3 (three) times daily as needed., Disp: 30 capsule, Rfl: 0 ?  Cetirizine HCl (ZYRTEC PO), Take 10 mg by mouth daily., Disp: , Rfl:  ?  Cholecalciferol (VITAMIN D) 2000 UNITS tablet, Take 1 tablet by mouth daily., Disp: , Rfl:  ?  dapagliflozin propanediol (FARXIGA) 10 MG TABS tablet, Take 1 tablet (10 mg total) by mouth daily before breakfast., Disp: 90 tablet, Rfl: 0 ?  fluticasone (FLONASE) 50 MCG/ACT nasal spray, Place 2 sprays into both nostrils daily., Disp: 16 g, Rfl: 5 ?  Fluticasone-Umeclidin-Vilant (TRELEGY ELLIPTA) 100-62.5-25 MCG/ACT AEPB, Inhale 1 puff into the lungs daily., Disp: 1 each, Rfl: 3 ?  gabapentin (NEURONTIN) 300 MG capsule, Take 300 mg by mouth at bedtime., Disp: , Rfl:  ?  ipratropium-albuterol (DUONEB) 0.5-2.5 (3) MG/3ML SOLN, Take 3 mLs by nebulization every 4 (four) hours as needed., Disp: 120 mL, Rfl: 0 ?  meloxicam  (MOBIC) 15 MG tablet, Take 1 tablet (15 mg total) by mouth daily., Disp: 90 tablet, Rfl: 0 ?  montelukast (SINGULAIR) 10 MG tablet, TAKE 1 TABLET BY MOUTH EVERYDAY AT BEDTIME, Disp: 90 tablet, Rfl: 1 ?  omeprazole (PRILOSEC) 20 MG capsule, TAKE 1 CAPSULE BY MOUTH ONCE DAILY, Disp: 90 capsule, Rfl: 1 ?  pioglitazone (ACTOS) 15 MG tablet, Take 1 tablet (15 mg total) by mouth daily., Disp: 90 tablet, Rfl: 1 ?  rosuvastatin (CRESTOR) 40 MG tablet, Take 1 tablet (40 mg total) by mouth daily., Disp: 90 tablet, Rfl: 1 ?  sulfamethoxazole-trimethoprim (BACTRIM DS) 800-160 MG tablet, Take 1 tablet by mouth 2 (two) times daily., Disp: 10 tablet, Rfl: 0 ?  valsartan-hydrochlorothiazide (DIOVAN-HCT) 160-12.5 MG tablet, Take 1 tablet by mouth daily., Disp: 90 tablet, Rfl: 1 ? ?Observations/Objective: ?Patient is well-developed, well-nourished in no acute distress.  ?Resting comfortably at home.  ?Head is normocephalic, atraumatic.  ?No labored  breathing.  ?Speech is clear and coherent with logical content.  ?Patient is alert and oriented at baseline.  ? ? ?Assessment and Plan: ?1. Acute bacterial sinusitis ?- levofloxacin (LEVAQUIN) 500 MG tablet; Take 1 tablet (500 mg total) by mouth daily for 7 days.  Dispense: 7 tablet; Refill: 0 ? ?2. Nausea ?- ondansetron (ZOFRAN) 4 MG tablet; Take 1 tablet (4 mg total) by mouth every 8 (eight) hours as needed for nausea or vomiting.  Dispense: 20 tablet; Refill: 0 ? ?- Worsening symptoms that have not responded to OTC medications or Doxycycline.  ?- Will give Levaquin ?- Zofran for nausea and cramping from Levaquin ?- Continue allergy medications.  ?- Steam with or without echinacea essential oil, humidifier in bedroom overnight can help ?- Saline nasal rinses twice daily, use flonase after saline nasal rinse ?- Stay well hydrated and get plenty of rest.  ?- Seek in person evaluation, may require ENT follow up, if no symptom improvement or if symptoms worsen. ? ? ?Follow Up  Instructions: ?I discussed the assessment and treatment plan with the patient. The patient was provided an opportunity to ask questions and all were answered. The patient agreed with the plan and demonstrated an unders

## 2021-05-26 NOTE — Patient Instructions (Signed)
?Annais Crafts, thank you for joining Mar Daring, PA-C for today's virtual visit.  While this provider is not your primary care provider (PCP), if your PCP is located in our provider database this encounter information will be shared with them immediately following your visit. ? ?Consent: ?(Patient) Kelsey Alvarez provided verbal consent for this virtual visit at the beginning of the encounter. ? ?Current Medications: ? ?Current Outpatient Medications:  ?  levofloxacin (LEVAQUIN) 500 MG tablet, Take 1 tablet (500 mg total) by mouth daily for 7 days., Disp: 7 tablet, Rfl: 0 ?  ondansetron (ZOFRAN) 4 MG tablet, Take 1 tablet (4 mg total) by mouth every 8 (eight) hours as needed for nausea or vomiting., Disp: 20 tablet, Rfl: 0 ?  albuterol (VENTOLIN HFA) 108 (90 Base) MCG/ACT inhaler, TAKE 2 PUFFS BY MOUTH EVERY 6 HOURS AS NEEDED FOR WHEEZE OR SHORTNESS OF BREATH, Disp: 8.5 each, Rfl: 0 ?  Alpha Lipoic Acid 200 MG CAPS, Take by mouth., Disp: , Rfl:  ?  aspirin EC 81 MG tablet, Take 1 tablet (81 mg total) by mouth daily., Disp: 30 tablet, Rfl: 0 ?  baclofen (LIORESAL) 10 MG tablet, Take 1 tablet (10 mg total) by mouth 2 (two) times daily., Disp: 60 each, Rfl: 0 ?  benzonatate (TESSALON) 100 MG capsule, Take 1 capsule (100 mg total) by mouth 3 (three) times daily as needed., Disp: 30 capsule, Rfl: 0 ?  Cetirizine HCl (ZYRTEC PO), Take 10 mg by mouth daily., Disp: , Rfl:  ?  Cholecalciferol (VITAMIN D) 2000 UNITS tablet, Take 1 tablet by mouth daily., Disp: , Rfl:  ?  dapagliflozin propanediol (FARXIGA) 10 MG TABS tablet, Take 1 tablet (10 mg total) by mouth daily before breakfast., Disp: 90 tablet, Rfl: 0 ?  fluticasone (FLONASE) 50 MCG/ACT nasal spray, Place 2 sprays into both nostrils daily., Disp: 16 g, Rfl: 5 ?  Fluticasone-Umeclidin-Vilant (TRELEGY ELLIPTA) 100-62.5-25 MCG/ACT AEPB, Inhale 1 puff into the lungs daily., Disp: 1 each, Rfl: 3 ?  gabapentin (NEURONTIN) 300 MG capsule, Take 300 mg by mouth  at bedtime., Disp: , Rfl:  ?  ipratropium-albuterol (DUONEB) 0.5-2.5 (3) MG/3ML SOLN, Take 3 mLs by nebulization every 4 (four) hours as needed., Disp: 120 mL, Rfl: 0 ?  meloxicam (MOBIC) 15 MG tablet, Take 1 tablet (15 mg total) by mouth daily., Disp: 90 tablet, Rfl: 0 ?  montelukast (SINGULAIR) 10 MG tablet, TAKE 1 TABLET BY MOUTH EVERYDAY AT BEDTIME, Disp: 90 tablet, Rfl: 1 ?  omeprazole (PRILOSEC) 20 MG capsule, TAKE 1 CAPSULE BY MOUTH ONCE DAILY, Disp: 90 capsule, Rfl: 1 ?  pioglitazone (ACTOS) 15 MG tablet, Take 1 tablet (15 mg total) by mouth daily., Disp: 90 tablet, Rfl: 1 ?  rosuvastatin (CRESTOR) 40 MG tablet, Take 1 tablet (40 mg total) by mouth daily., Disp: 90 tablet, Rfl: 1 ?  sulfamethoxazole-trimethoprim (BACTRIM DS) 800-160 MG tablet, Take 1 tablet by mouth 2 (two) times daily., Disp: 10 tablet, Rfl: 0 ?  valsartan-hydrochlorothiazide (DIOVAN-HCT) 160-12.5 MG tablet, Take 1 tablet by mouth daily., Disp: 90 tablet, Rfl: 1  ? ?Medications ordered in this encounter:  ?Meds ordered this encounter  ?Medications  ? levofloxacin (LEVAQUIN) 500 MG tablet  ?  Sig: Take 1 tablet (500 mg total) by mouth daily for 7 days.  ?  Dispense:  7 tablet  ?  Refill:  0  ?  Order Specific Question:   Supervising Provider  ?  Answer:   Noemi Chapel [[id number]]  ? ondansetron (ZOFRAN)  4 MG tablet  ?  Sig: Take 1 tablet (4 mg total) by mouth every 8 (eight) hours as needed for nausea or vomiting.  ?  Dispense:  20 tablet  ?  Refill:  0  ?  Order Specific Question:   Supervising Provider  ?  Answer:   Noemi Chapel [[id number]]  ?  ? ?*If you need refills on other medications prior to your next appointment, please contact your pharmacy* ? ?Follow-Up: ?Call back or seek an in-person evaluation if the symptoms worsen or if the condition fails to improve as anticipated. ? ?Other Instructions ? ?- Worsening symptoms that have not responded to OTC medications or Doxycycline.  ?- Will give Levaquin ?- Zofran for nausea and cramping from  Levaquin ?- Continue allergy medications.  ?- Steam with or without echinacea essential oil, humidifier in bedroom overnight can help ?- Saline nasal rinses twice daily, use flonase after saline nasal rinse ?- Stay well hydrated and get plenty of rest.  ?- Seek in person evaluation, may require ENT follow up, if no symptom improvement or if symptoms worsen. ? ? ?If you have been instructed to have an in-person evaluation today at a local Urgent Care facility, please use the link below. It will take you to a list of all of our available [facility], including address, phone number and hours of operation. Please do not delay care.  ?[facility] ? ?If you or a family member do not have a primary care provider, use the link below to schedule a visit and establish care. When you choose a [facility] primary care physician or advanced practice provider, you gain a long-term partner in health. ?Find a Primary Care Provider ? ?Learn more about [facility]'s in-office and virtual care options: ?Short Now ?

## 2021-05-27 ENCOUNTER — Other Ambulatory Visit: Payer: Self-pay | Admitting: Family Medicine

## 2021-05-27 DIAGNOSIS — E1142 Type 2 diabetes mellitus with diabetic polyneuropathy: Secondary | ICD-10-CM

## 2021-05-28 ENCOUNTER — Other Ambulatory Visit: Payer: Self-pay | Admitting: Family Medicine

## 2021-05-28 DIAGNOSIS — J4540 Moderate persistent asthma, uncomplicated: Secondary | ICD-10-CM

## 2021-06-13 ENCOUNTER — Emergency Department: Payer: 59

## 2021-06-13 ENCOUNTER — Emergency Department
Admission: EM | Admit: 2021-06-13 | Discharge: 2021-06-13 | Disposition: A | Discharge status: Home / Self Care | Payer: 59 | Attending: Emergency Medicine | Admitting: Emergency Medicine

## 2021-06-13 DIAGNOSIS — R1032 Left lower quadrant pain: Secondary | ICD-10-CM | POA: Diagnosis present

## 2021-06-13 DIAGNOSIS — I10 Essential (primary) hypertension: Secondary | ICD-10-CM | POA: Insufficient documentation

## 2021-06-13 LAB — COMPREHENSIVE METABOLIC PANEL
ALT: 21 U/L (ref 0–44)
AST: 25 U/L (ref 15–41)
Albumin: 4 g/dL (ref 3.5–5.0)
Alkaline Phosphatase: 60 U/L (ref 38–126)
Anion gap: 11 (ref 5–15)
BUN: 12 mg/dL (ref 6–20)
CO2: 28 mmol/L (ref 22–32)
Calcium: 9.3 mg/dL (ref 8.9–10.3)
Chloride: 98 mmol/L (ref 98–111)
Creatinine, Ser: 0.72 mg/dL (ref 0.44–1.00)
GFR, Estimated: >60 mL/min (ref >60)
Glucose, Bld: 140 mg/dL — ABNORMAL HIGH (ref 70–99)
Potassium: 3.5 mmol/L (ref 3.5–5.1)
Sodium: 137 mmol/L (ref 135–145)
Total Bilirubin: 0.6 mg/dL (ref 0.3–1.2)
Total Protein: 7.9 g/dL (ref 6.5–8.1)

## 2021-06-13 LAB — URINALYSIS, ROUTINE W REFLEX MICROSCOPIC
Bacteria, UA: NONE SEEN
Bilirubin Urine: NEGATIVE
Glucose, UA: >=500 mg/dL — AB
Hgb urine dipstick: NEGATIVE
Ketones, ur: NEGATIVE mg/dL
Leukocytes,Ua: NEGATIVE
Nitrite: NEGATIVE
Protein, ur: NEGATIVE mg/dL
Specific Gravity, Urine: 1.028 (ref 1.005–1.030)
pH: 5 (ref 5.0–8.0)

## 2021-06-13 LAB — CBC
HCT: 45.5 % (ref 36.0–46.0)
Hemoglobin: 14.9 g/dL (ref 12.0–15.0)
MCH: 26.3 pg (ref 26.0–34.0)
MCHC: 32.7 g/dL (ref 30.0–36.0)
MCV: 80.2 fL (ref 80.0–100.0)
Platelets: 292 10*3/uL (ref 150–400)
RBC: 5.67 MIL/uL — ABNORMAL HIGH (ref 3.87–5.11)
RDW: 14.2 % (ref 11.5–15.5)
WBC: 9.3 10*3/uL (ref 4.0–10.5)
nRBC: 0 % (ref 0.0–0.2)

## 2021-06-13 LAB — LIPASE, BLOOD: Lipase: 40 U/L (ref 11–51)

## 2021-06-13 MED ORDER — KETOROLAC TROMETHAMINE 30 MG/ML IJ SOLN
30.0000 mg | Freq: Once | INTRAMUSCULAR | Status: AC
  Administered 2021-06-13: 30 mg via INTRAMUSCULAR
  Filled 2021-06-13: qty 1

## 2021-06-13 MED ORDER — TRAMADOL HCL 50 MG PO TABS: 50.0000 mg | ORAL_TABLET | Freq: Four times a day (QID) | ORAL | 0 refills | Status: DC | PRN

## 2021-06-13 NOTE — ED Provider Notes (Signed)
? ?[facility] ?Provider Note ? ? ? Event Date/Time  ? First MD Initiated Contact with Patient 06/13/21 1825   ?  (approximate) ? ? ?History  ? ?Abdominal Pain ? ? ?HPI ? ?Kelsey Alvarez is a 51 y.o. female with a history of fibroids, hypertension status post bilateral salpingo-oophorectomy who presents with complaints of left lower quadrant abdominal pain.  Patient reports is been going on for some time but seems to have worsened over the last 2 days, she describes intermittent cramping pain in the left lower quadrant.  Denies a history of kidney stones.  No dysuria.  No hematuria.  No fevers or chills. ?  ? ? ?Physical Exam  ? ?Triage Vital Signs: ?ED Triage Vitals  ?Enc Vitals Group  ?   BP 06/13/21 1755 (!) 143/82  ?   Pulse Rate 06/13/21 1755 84  ?   Resp 06/13/21 1755 18  ?   Temp 06/13/21 1755 98.5 ?F (36.9 ?C)  ?   Temp Source 06/13/21 1755 Oral  ?   SpO2 06/13/21 1755 99 %  ?   Weight 06/13/21 1755 120 kg (264 lb 8.8 oz)  ?   Height --   ?   Head Circumference --   ?   Peak Flow --   ?   Pain Score 06/13/21 1755 8  ?   Pain Loc --   ?   Pain Edu? --   ?   Excl. in [place]? --   ? ? ?Most recent vital signs: ?Vitals:  ? 06/13/21 1755 06/13/21 2019  ?BP: (!) 143/82 119/69  ?Pulse: 84 70  ?Resp: 18 16  ?Temp: 98.5 ?F (36.9 ?C)   ?SpO2: 99% 94%  ? ? ? ?General: Awake, no distress.  ?CV:  Good peripheral perfusion.  ?Resp:  Normal effort.  ?Abd:  No distention.  Mild tenderness left lower quadrant, no CVA tenderness ?Other:   ? ? ?ED Results / Procedures / Treatments  ? ?Labs ?(all labs ordered are listed, but only abnormal results are displayed) ?Labs Reviewed  ?COMPREHENSIVE METABOLIC PANEL - Abnormal; Notable for the following components:  ?    Result Value  ? Glucose, Bld 140 (*)   ? All other components within normal limits  ?CBC - Abnormal; Notable for the following components:  ? RBC 5.67 (*)   ? All other components within normal limits  ?URINALYSIS, ROUTINE W REFLEX MICROSCOPIC -  Abnormal; Notable for the following components:  ? Color, Urine YELLOW (*)   ? APPearance HAZY (*)   ? Glucose, UA >=500 (*)   ? All other components within normal limits  ?LIPASE, BLOOD  ? ? ? ?EKG ? ? ? ? ?RADIOLOGY ?CT renal stone study reviewed by me, negative for acute abnormality ? ? ? ?PROCEDURES: ? ?Critical Care performed:  ? ?Procedures ? ? ?MEDICATIONS ORDERED IN ED: ?Medications  ?ketorolac (TORADOL) 30 MG/ML injection 30 mg (30 mg Intramuscular Given 06/13/21 1942)  ? ? ? ?IMPRESSION / MDM / ASSESSMENT AND PLAN / ED COURSE  ?I reviewed the triage vital signs and the nursing notes. ? ? ? ?Patient presents with left lower quadrant abdominal pain as described above, she has mild tenderness left lower quadrant.  Differential includes diverticulitis, UTI, ureterolithiasis. ? ?We will give IM Toradol, obtain CT renal stone study. ? ?Lab work reviewed and is overall reassuring, normal white blood cell count ? ?Urinalysis unremarkable ? ?CT scan without acute abnormality, did discuss fibroid which she is aware  of.  Possible this could be causing discomfort.  She is improved after the Toradol. ? ?Plan continue treatment with analgesics, outpatient follow-up with gynecology return precautions discussed ? ? ? ?  ? ? ?FINAL CLINICAL IMPRESSION(S) / ED DIAGNOSES  ? ?Final diagnoses:  ?Left lower quadrant abdominal pain  ? ? ? ?Rx / DC Orders  ? ?ED Discharge Orders   ? ?      Ordered  ?  traMADol (ULTRAM) 50 MG tablet  Every 6 hours PRN       ? 06/13/21 2010  ? ?  ?  ? ?  ? ? ? ?Note:  This document was prepared using Dragon voice recognition software and may include unintentional dictation errors. ?  ?Lavonia Drafts, MD ?06/13/21 2302 ? ?

## 2021-06-13 NOTE — ED Triage Notes (Signed)
Pt comes pov with lower left sided abd pain radiating down to her leg. Hx of ovarian cysts. Started 2 weeks ago and has been off and on but worse today.  ?

## 2021-06-16 NOTE — Progress Notes (Signed)
Name: Kelsey Alvarez   MRN: [id number]    DOB: [birth year]   Date:06/19/2021 ? ?     Progress Note ? ?Subjective ? ?Chief Complaint ? ?Follow Up ? ?HPI ? ? ?DM:  hgbA1C was 7.9%, it went up to 8.6 % to 9 % and last level was down  6.3 %.  She was unable to tolerate Metformin or GLP-1 agonist , she is on [place] and started on Actos Fall 2022 .She still has neuropathy symptoms ( hands and feet) but doing better on gabapentin but has not been complaint with medication lately . She denies polyphagia, polydipsia or polyuria . She is also taking Crestor since she came in last time and we will recheck labs next visit  ?  ?Morbid Obesity:off Ozempic, she stopped because she could not eat anything, she had lost 12 lbs in a short period of time,  we tried switching to Rybelsus but only able to tolerate 3 mg dose, her weight went down 8 lbs but also unable to tolerate medication. Weight is back up again from 266 lbs to 271 lbs. She needs to increase physical activity and eat smaller portions  ? ?Periumbilical pain: going on for over 4 months  pain is described as aching or cramping like and starts with first bite of food and takes 30-60 minutes after a meal to fell better. She states has loose bowel movements but that is normal to her. No blood in stools. Reminded her to follow up with GI  ?  ?Asthma: she has moderate asthma, she is currently using [place] and is doing well, has some SOB with strenous activity. She states symptoms not as controlled this time of the year. Having itchy eyes, throat and ear, noticing a dry cough. She is taking xyzal and singulair daily  ?  ?Hyperlipidemia: she was on Atorvastatin but LDL was not at goal, she is now taking Rosuvastatin and has been compliant with daily medication , we will recheck next visit  ?  ?HTN: taking Valsartan hctz and bp is at goal, no side effects. No chest pain oand intermittent palpitation but stable  ?  ?Low back pain : used to be intermittent across lower back, but  over the past 6 weeks pain has been constant worse when standing still, and used to radiate to her left thigh, but she states lately radiating down below her knee and also went to [facility] for left flank pain radiating to pelvic area, had negative CT and labs. She has pain with extension of back, some dysuria and urinary frequency. Explained it may be radiculitis and needs to resume Gabapentin every night and we will treat for UTI also and monitor  ? ?Patient Active Problem List  ? Diagnosis Date Noted  ? Type 2 diabetes, controlled, with peripheral neuropathy ([place]) 01/20/2018  ? Endometriosis 11/06/2017  ? Menorrhagia with irregular cycle 10/28/2017  ? Fibroid uterus 10/28/2017  ? Carpal tunnel syndrome on both sides 10/17/2017  ? B12 deficiency 04/27/2016  ? History of endometriosis 11/17/2015  ? Oligomenorrhea 10/12/2015  ? Benign essential HTN 12/28/2014  ? Cervical polyp 12/28/2014  ? Controlled type 2 diabetes mellitus with microalbuminuria ([place]) 12/28/2014  ? Dyslipidemia 12/28/2014  ? Dysmenorrhea 12/28/2014  ? Edema 12/28/2014  ? Female infertility 12/28/2014  ? Gastro-esophageal reflux disease without esophagitis 12/28/2014  ? Morbid obesity ([place]) 12/28/2014  ? Asthma, moderate persistent, poorly-controlled 12/28/2014  ? Perennial allergic rhinitis 12/28/2014  ? Intermittent tremor 12/28/2014  ? Vitamin D deficiency 12/28/2014  ?  Intermittent low back pain 12/28/2014  ? Left sciatic nerve pain 12/28/2014  ? ? ?Past Surgical History:  ?Procedure Laterality Date  ? APPENDECTOMY    ? cervical polyp removal    ? DILATION AND CURETTAGE OF UTERUS    ? HYSTEROSCOPY    ? HYSTEROSCOPY WITH D & C N/A 10/12/2015  ? Procedure: DILATATION AND CURETTAGE /HYSTEROSCOPY;  Surgeon: Will Bonnet, MD;  Location: [facility] ORS;  Service: Gynecology;  Laterality: N/A;  ? LAPAROSCOPIC BILATERAL SALPINGO OOPHERECTOMY N/A 10/12/2015  ? Procedure: LAPAROSCOPIC RIGHT SALPINGECTOMY, LEFT OOPHORECTOMY;  Surgeon: Will Bonnet, MD;   Location: [facility] ORS;  Service: Gynecology;  Laterality: N/A;  ? LAPAROSCOPIC OVARIAN CYSTECTOMY Bilateral 10/12/2015  ? Procedure: LAPAROSCOPIC OVARIAN CYSTECTOMY;  Surgeon: Will Bonnet, MD;  Location: [facility] ORS;  Service: Gynecology;  Laterality: Bilateral;  ? OOPHORECTOMY    ? OVARIAN CYST REMOVAL    ? ? ?Family History  ?Problem Relation Age of Onset  ? Hypertension Mother   ? Diabetes Mother   ? CAD Mother   ? Cancer Mother   ?     cervical  ? Heart attack Mother 89  ?     Triple Bypass  ? CAD Father   ? Hypertension Father   ? Arthritis Father   ? Thyroid disease Sister   ? Alzheimer's disease Maternal Grandmother   ? Alzheimer's disease Maternal Grandfather   ? Breast cancer Paternal Grandmother   ? ? ?Social History  ? ?Tobacco Use  ? Smoking status: Former  ?  Years: 1.00  ?  Types: Cigarettes  ?  Start date: 12/03/2001  ?  Quit date: 12/28/2002  ?  Years since quitting: 18.4  ? Smokeless tobacco: Never  ? Tobacco comments:  ?  smoked 3 to 4 cigarrettes a day for a year  ?Substance Use Topics  ? Alcohol use: Yes  ?  Alcohol/week: 0.0 standard drinks  ?  Comment: occasionally drinks margarita, once or twice a year  ? ? ? ?Current Outpatient Medications:  ?  albuterol (VENTOLIN HFA) 108 (90 Base) MCG/ACT inhaler, TAKE 2 PUFFS BY MOUTH EVERY 6 HOURS AS NEEDED FOR WHEEZE OR SHORTNESS OF BREATH, Disp: 8.5 each, Rfl: 0 ?  Alpha Lipoic Acid 200 MG CAPS, Take by mouth., Disp: , Rfl:  ?  baclofen (LIORESAL) 10 MG tablet, Take 1 tablet (10 mg total) by mouth 2 (two) times daily., Disp: 60 each, Rfl: 0 ?  Cetirizine HCl (ZYRTEC PO), Take 10 mg by mouth daily., Disp: , Rfl:  ?  Cholecalciferol (VITAMIN D) 2000 UNITS tablet, Take 1 tablet by mouth daily., Disp: , Rfl:  ?  FARXIGA 10 MG TABS tablet, TAKE 1 TABLET BY MOUTH DAILY BEFORE BREAKFAST., Disp: 90 tablet, Rfl: 0 ?  fluticasone (FLONASE) 50 MCG/ACT nasal spray, Place 2 sprays into both nostrils daily., Disp: 16 g, Rfl: 5 ?  Fluticasone-Umeclidin-Vilant (TRELEGY  ELLIPTA) 100-62.5-25 MCG/ACT AEPB, Inhale 1 puff into the lungs daily., Disp: 1 each, Rfl: 3 ?  gabapentin (NEURONTIN) 300 MG capsule, Take 300 mg by mouth at bedtime., Disp: , Rfl:  ?  ipratropium-albuterol (DUONEB) 0.5-2.5 (3) MG/3ML SOLN, Take 3 mLs by nebulization every 4 (four) hours as needed., Disp: 120 mL, Rfl: 0 ?  meloxicam (MOBIC) 15 MG tablet, Take 1 tablet (15 mg total) by mouth daily., Disp: 90 tablet, Rfl: 0 ?  montelukast (SINGULAIR) 10 MG tablet, TAKE 1 TABLET BY MOUTH EVERYDAY AT BEDTIME, Disp: 90 tablet, Rfl: 1 ?  pioglitazone (  ACTOS) 15 MG tablet, Take 1 tablet (15 mg total) by mouth daily., Disp: 90 tablet, Rfl: 1 ?  rosuvastatin (CRESTOR) 40 MG tablet, Take 1 tablet (40 mg total) by mouth daily., Disp: 90 tablet, Rfl: 1 ?  sulfamethoxazole-trimethoprim (BACTRIM DS) 800-160 MG tablet, Take 1 tablet by mouth 2 (two) times daily., Disp: 10 tablet, Rfl: 0 ?  traMADol (ULTRAM) 50 MG tablet, Take 1 tablet (50 mg total) by mouth every 6 (six) hours as needed., Disp: 20 tablet, Rfl: 0 ?  valsartan-hydrochlorothiazide (DIOVAN-HCT) 160-12.5 MG tablet, Take 1 tablet by mouth daily., Disp: 90 tablet, Rfl: 1 ?  aspirin EC 81 MG tablet, Take 1 tablet (81 mg total) by mouth daily. (Patient not taking: Reported on 06/19/2021), Disp: 30 tablet, Rfl: 0 ?  omeprazole (PRILOSEC) 20 MG capsule, TAKE 1 CAPSULE BY MOUTH ONCE DAILY (Patient not taking: Reported on 06/19/2021), Disp: 90 capsule, Rfl: 1 ?  ondansetron (ZOFRAN) 4 MG tablet, Take 1 tablet (4 mg total) by mouth every 8 (eight) hours as needed for nausea or vomiting. (Patient not taking: Reported on 06/19/2021), Disp: 20 tablet, Rfl: 0 ? ?Allergies  ?Allergen Reactions  ? Fish Allergy Shortness Of Breath  ? Penicillins Anaphylaxis and Rash  ?  Childhood allergy  ? Levofloxacin Other (See Comments)  ?  Cramping  ? Adhesive [Tape] Rash  ?  Paper tape ok to use.  ? ? ?I personally reviewed active problem list, medication list, allergies, family history, social  history, health maintenance with the patient/caregiver today. ? ? ?ROS ? ?Ten systems reviewed and is negative except as mentioned in HPI  ? ?Objective ? ?Vitals:  ? 06/19/21 1134  ?BP: 128/74  ?Pulse: 84  ?Resp: 16

## 2021-06-19 ENCOUNTER — Ambulatory Visit: Payer: 59 | Admitting: Family Medicine

## 2021-06-19 ENCOUNTER — Encounter: Payer: Self-pay | Admitting: Family Medicine

## 2021-06-19 VITALS — BP 128/74 | HR 84 | Resp 16 | Ht 62.0 in | Wt 271.0 lb

## 2021-06-19 DIAGNOSIS — R102 Pelvic and perineal pain: Secondary | ICD-10-CM

## 2021-06-19 DIAGNOSIS — R3911 Hesitancy of micturition: Secondary | ICD-10-CM

## 2021-06-19 DIAGNOSIS — E1142 Type 2 diabetes mellitus with diabetic polyneuropathy: Secondary | ICD-10-CM | POA: Diagnosis not present

## 2021-06-19 DIAGNOSIS — J3089 Other allergic rhinitis: Secondary | ICD-10-CM

## 2021-06-19 DIAGNOSIS — R300 Dysuria: Secondary | ICD-10-CM

## 2021-06-19 DIAGNOSIS — J4540 Moderate persistent asthma, uncomplicated: Secondary | ICD-10-CM

## 2021-06-19 DIAGNOSIS — E785 Hyperlipidemia, unspecified: Secondary | ICD-10-CM

## 2021-06-19 DIAGNOSIS — E559 Vitamin D deficiency, unspecified: Secondary | ICD-10-CM | POA: Diagnosis not present

## 2021-06-19 DIAGNOSIS — E1169 Type 2 diabetes mellitus with other specified complication: Secondary | ICD-10-CM

## 2021-06-19 DIAGNOSIS — I10 Essential (primary) hypertension: Secondary | ICD-10-CM

## 2021-06-19 DIAGNOSIS — E538 Deficiency of other specified B group vitamins: Secondary | ICD-10-CM

## 2021-06-19 DIAGNOSIS — M5410 Radiculopathy, site unspecified: Secondary | ICD-10-CM

## 2021-06-19 DIAGNOSIS — K219 Gastro-esophageal reflux disease without esophagitis: Secondary | ICD-10-CM

## 2021-06-19 MED ORDER — GABAPENTIN 300 MG PO CAPS: 300.0000 mg | ORAL_CAPSULE | Freq: Every day | ORAL | 1 refills | Status: DC

## 2021-06-19 MED ORDER — DAPAGLIFLOZIN PROPANEDIOL 10 MG PO TABS: 10.0000 mg | ORAL_TABLET | Freq: Every day | ORAL | 0 refills | Status: DC

## 2021-06-19 MED ORDER — TRELEGY ELLIPTA 100-62.5-25 MCG/ACT IN AEPB: 1.0000 | INHALATION_SPRAY | Freq: Every day | RESPIRATORY_TRACT | 3 refills | Status: DC

## 2021-06-19 MED ORDER — MELOXICAM 15 MG PO TABS: 15.0000 mg | ORAL_TABLET | Freq: Every day | ORAL | 0 refills | Status: DC

## 2021-06-19 MED ORDER — NITROFURANTOIN MONOHYD MACRO 100 MG PO CAPS: 100.0000 mg | ORAL_CAPSULE | Freq: Two times a day (BID) | ORAL | 0 refills | Status: DC

## 2021-06-21 LAB — CULTURE, URINE COMPREHENSIVE
MICRO NUMBER:: 13276620
SPECIMEN QUALITY:: ADEQUATE

## 2021-06-21 LAB — HEMOGLOBIN A1C
Hgb A1c MFr Bld: 6.5 % of total Hgb — ABNORMAL HIGH (ref <5.7)
Mean Plasma Glucose: 140 mg/dL
eAG (mmol/L): 7.7 mmol/L

## 2021-06-22 ENCOUNTER — Other Ambulatory Visit: Payer: Self-pay | Admitting: Family Medicine

## 2021-06-22 DIAGNOSIS — J4540 Moderate persistent asthma, uncomplicated: Secondary | ICD-10-CM

## 2021-07-27 ENCOUNTER — Other Ambulatory Visit: Payer: Self-pay | Admitting: Family Medicine

## 2021-07-27 DIAGNOSIS — J4540 Moderate persistent asthma, uncomplicated: Secondary | ICD-10-CM

## 2021-08-16 ENCOUNTER — Telehealth: Payer: 59 | Admitting: Physician Assistant

## 2021-08-16 DIAGNOSIS — J4521 Mild intermittent asthma with (acute) exacerbation: Secondary | ICD-10-CM

## 2021-08-16 DIAGNOSIS — J069 Acute upper respiratory infection, unspecified: Secondary | ICD-10-CM

## 2021-08-16 MED ORDER — BENZONATATE 100 MG PO CAPS: 100.0000 mg | ORAL_CAPSULE | Freq: Three times a day (TID) | ORAL | 0 refills | Status: DC | PRN

## 2021-08-16 MED ORDER — PREDNISONE 20 MG PO TABS: 40.0000 mg | ORAL_TABLET | Freq: Every day | ORAL | 0 refills | Status: DC

## 2021-08-16 NOTE — Progress Notes (Signed)
Virtual Visit Consent   Kelsey Alvarez, you are scheduled for a virtual visit with a [facility] provider today. Just as with appointments in the office, your consent must be obtained to participate. Your consent will be active for this visit and any virtual visit you may have with one of our providers in the next 365 days. If you have a MyChart account, a copy of this consent can be sent to you electronically.  As this is a virtual visit, video technology does not allow for your provider to perform a traditional examination. This may limit your provider's ability to fully assess your condition. If your provider identifies any concerns that need to be evaluated in person or the need to arrange testing (such as labs, EKG, etc.), we will make arrangements to do so. Although advances in technology are sophisticated, we cannot ensure that it will always work on either your end or our end. If the connection with a video visit is poor, the visit may have to be switched to a telephone visit. With either a video or telephone visit, we are not always able to ensure that we have a secure connection.  By engaging in this virtual visit, you consent to the provision of healthcare and authorize for your insurance to be billed (if applicable) for the services provided during this visit. Depending on your insurance coverage, you may receive a charge related to this service.  I need to obtain your verbal consent now. Are you willing to proceed with your visit today? Kelsey Alvarez has provided verbal consent on 49/14/2023 for a virtual visit (video or telephone). Leeanne Rio, [place]  Date: 08/16/2021 1:19 PM  Virtual Visit via Video Note   I, Leeanne Rio, connected with  Kelsey Alvarez  ([id number][id number]) on 08/16/21 at  1:30 PM EDT by a video-enabled telemedicine application and verified that I am speaking with the correct person using two identifiers.  Location: Patient: Virtual Visit Location  Patient: Home Provider: Virtual Visit Location Provider: Home Office   I discussed the limitations of evaluation and management by telemedicine and the availability of in person appointments. The patient expressed understanding and agreed to proceed.    History of Present Illness: Kelsey Alvarez is a 51 y.o. who identifies as a female who was assigned female at birth, and is being seen today for possible sinusitis. Patient endorses symptoms of allergies over the past week or so with nasal and head congestion and pressure with a mild cough. Now with more chest congestion and cough with some chest tightness. Has increased use of her albuterol inhaler which does help. Oxygen is saturating 95-97%. Denies fever, chills or aches. Took home COVID test which was negative. Denies recent travel or sick contact.   HPI: HPI  Problems:  Patient Active Problem List   Diagnosis Date Noted   Type 2 diabetes, controlled, with peripheral neuropathy ([place]) 01/20/2018   Endometriosis 11/06/2017   Menorrhagia with irregular cycle 10/28/2017   Fibroid uterus 10/28/2017   Carpal tunnel syndrome on both sides 10/17/2017   B12 deficiency 04/27/2016   History of endometriosis 11/17/2015   Oligomenorrhea 10/12/2015   Benign essential HTN 12/28/2014   Cervical polyp 12/28/2014   Controlled type 2 diabetes mellitus with microalbuminuria ([place]) 12/28/2014   Dyslipidemia 12/28/2014   Dysmenorrhea 12/28/2014   Edema 12/28/2014   Female infertility 12/28/2014   Gastro-esophageal reflux disease without esophagitis 12/28/2014   Morbid obesity ([place]) 12/28/2014   Asthma, moderate persistent, poorly-controlled 12/28/2014  Perennial allergic rhinitis 12/28/2014   Intermittent tremor 12/28/2014   Vitamin D deficiency 12/28/2014   Intermittent low back pain 12/28/2014   Left sciatic nerve pain 12/28/2014    Allergies:  Allergies  Allergen Reactions   Fish Allergy Shortness Of Breath   Penicillins Anaphylaxis and Rash     Childhood allergy   Levofloxacin Other (See Comments)    Cramping   Adhesive [Tape] Rash    Paper tape ok to use.   Medications:  Current Outpatient Medications:    benzonatate (TESSALON) 100 MG capsule, Take 1 capsule (100 mg total) by mouth 3 (three) times daily as needed for cough., Disp: 30 capsule, Rfl: 0   predniSONE (DELTASONE) 20 MG tablet, Take 2 tablets (40 mg total) by mouth daily with breakfast., Disp: 10 tablet, Rfl: 0   albuterol (VENTOLIN HFA) 108 (90 Base) MCG/ACT inhaler, TAKE 2 PUFFS BY MOUTH EVERY 6 HOURS AS NEEDED FOR WHEEZE OR SHORTNESS OF BREATH, Disp: 8.5 each, Rfl: 0   Alpha Lipoic Acid 200 MG CAPS, Take by mouth., Disp: , Rfl:    baclofen (LIORESAL) 10 MG tablet, Take 1 tablet (10 mg total) by mouth 2 (two) times daily., Disp: 60 each, Rfl: 0   Cetirizine HCl (ZYRTEC PO), Take 10 mg by mouth daily., Disp: , Rfl:    Cholecalciferol (VITAMIN D) 2000 UNITS tablet, Take 1 tablet by mouth daily., Disp: , Rfl:    dapagliflozin propanediol (FARXIGA) 10 MG TABS tablet, Take 1 tablet (10 mg total) by mouth daily before breakfast., Disp: 90 tablet, Rfl: 0   fluticasone (FLONASE) 50 MCG/ACT nasal spray, Place 2 sprays into both nostrils daily., Disp: 16 g, Rfl: 5   Fluticasone-Umeclidin-Vilant (TRELEGY ELLIPTA) 100-62.5-25 MCG/ACT AEPB, Inhale 1 puff into the lungs daily., Disp: 1 each, Rfl: 3   gabapentin (NEURONTIN) 300 MG capsule, Take 1 capsule (300 mg total) by mouth at bedtime., Disp: 90 capsule, Rfl: 1   ipratropium-albuterol (DUONEB) 0.5-2.5 (3) MG/3ML SOLN, Take 3 mLs by nebulization every 4 (four) hours as needed., Disp: 120 mL, Rfl: 0   meloxicam (MOBIC) 15 MG tablet, Take 1 tablet (15 mg total) by mouth daily., Disp: 90 tablet, Rfl: 0   montelukast (SINGULAIR) 10 MG tablet, TAKE 1 TABLET BY MOUTH EVERYDAY AT BEDTIME, Disp: 90 tablet, Rfl: 1   pioglitazone (ACTOS) 15 MG tablet, Take 1 tablet (15 mg total) by mouth daily., Disp: 90 tablet, Rfl: 1   rosuvastatin  (CRESTOR) 40 MG tablet, Take 1 tablet (40 mg total) by mouth daily., Disp: 90 tablet, Rfl: 1   traMADol (ULTRAM) 50 MG tablet, Take 1 tablet (50 mg total) by mouth every 6 (six) hours as needed., Disp: 20 tablet, Rfl: 0   valsartan-hydrochlorothiazide (DIOVAN-HCT) 160-12.5 MG tablet, Take 1 tablet by mouth daily., Disp: 90 tablet, Rfl: 1  Observations/Objective: Patient is well-developed, well-nourished in no acute distress.  Resting comfortable at home.  Head is normocephalic, atraumatic.  No labored breathing. Speech is clear and coherent with logical content.  Patient is alert and oriented at baseline.   Assessment and Plan: 1. Viral URI with cough - benzonatate (TESSALON) 100 MG capsule; Take 1 capsule (100 mg total) by mouth 3 (three) times daily as needed for cough.  Dispense: 30 capsule; Refill: 0  2. Mild intermittent asthma with exacerbation - predniSONE (DELTASONE) 20 MG tablet; Take 2 tablets (40 mg total) by mouth daily with breakfast.  Dispense: 10 tablet; Refill: 0 - benzonatate (TESSALON) 100 MG capsule; Take 1 capsule (100  mg total) by mouth 3 (three) times daily as needed for cough.  Dispense: 30 capsule; Refill: 0  Supportive measures and OTC medications reviewed. Start 5-day burst of prednisone. Tessalon per orders. Continue maintenance asthma and allergy medications. Strict follow-up precautions reviewed.   Follow Up Instructions: I discussed the assessment and treatment plan with the patient. The patient was provided an opportunity to ask questions and all were answered. The patient agreed with the plan and demonstrated an understanding of the instructions.  A copy of instructions were sent to the patient via MyChart unless otherwise noted below.   The patient was advised to call back or seek an in-person evaluation if the symptoms worsen or if the condition fails to improve as anticipated.  Time:  I spent 10 minutes with the patient via telehealth technology  discussing the above problems/concerns.    Leeanne Rio, PA-C

## 2021-08-16 NOTE — Patient Instructions (Signed)
[name], thank you for joining Leeanne Rio, PA-C for today's virtual visit.  While this provider is not your primary care provider (PCP), if your PCP is located in our provider database this encounter information will be shared with them immediately following your visit.  Consent: (Patient) Kelsey Alvarez provided verbal consent for this virtual visit at the beginning of the encounter.  Current Medications:  Current Outpatient Medications:    benzonatate (TESSALON) 100 MG capsule, Take 1 capsule (100 mg total) by mouth 3 (three) times daily as needed for cough., Disp: 30 capsule, Rfl: 0   predniSONE (DELTASONE) 20 MG tablet, Take 2 tablets (40 mg total) by mouth daily with breakfast., Disp: 10 tablet, Rfl: 0   albuterol (VENTOLIN HFA) 108 (90 Base) MCG/ACT inhaler, TAKE 2 PUFFS BY MOUTH EVERY 6 HOURS AS NEEDED FOR WHEEZE OR SHORTNESS OF BREATH, Disp: 8.5 each, Rfl: 0   Alpha Lipoic Acid 200 MG CAPS, Take by mouth., Disp: , Rfl:    baclofen (LIORESAL) 10 MG tablet, Take 1 tablet (10 mg total) by mouth 2 (two) times daily., Disp: 60 each, Rfl: 0   Cetirizine HCl (ZYRTEC PO), Take 10 mg by mouth daily., Disp: , Rfl:    Cholecalciferol (VITAMIN D) 2000 UNITS tablet, Take 1 tablet by mouth daily., Disp: , Rfl:    dapagliflozin propanediol (FARXIGA) 10 MG TABS tablet, Take 1 tablet (10 mg total) by mouth daily before breakfast., Disp: 90 tablet, Rfl: 0   fluticasone (FLONASE) 50 MCG/ACT nasal spray, Place 2 sprays into both nostrils daily., Disp: 16 g, Rfl: 5   Fluticasone-Umeclidin-Vilant (TRELEGY ELLIPTA) 100-62.5-25 MCG/ACT AEPB, Inhale 1 puff into the lungs daily., Disp: 1 each, Rfl: 3   gabapentin (NEURONTIN) 300 MG capsule, Take 1 capsule (300 mg total) by mouth at bedtime., Disp: 90 capsule, Rfl: 1   ipratropium-albuterol (DUONEB) 0.5-2.5 (3) MG/3ML SOLN, Take 3 mLs by nebulization every 4 (four) hours as needed., Disp: 120 mL, Rfl: 0   meloxicam (MOBIC) 15 MG tablet, Take 1  tablet (15 mg total) by mouth daily., Disp: 90 tablet, Rfl: 0   montelukast (SINGULAIR) 10 MG tablet, TAKE 1 TABLET BY MOUTH EVERYDAY AT BEDTIME, Disp: 90 tablet, Rfl: 1   pioglitazone (ACTOS) 15 MG tablet, Take 1 tablet (15 mg total) by mouth daily., Disp: 90 tablet, Rfl: 1   rosuvastatin (CRESTOR) 40 MG tablet, Take 1 tablet (40 mg total) by mouth daily., Disp: 90 tablet, Rfl: 1   traMADol (ULTRAM) 50 MG tablet, Take 1 tablet (50 mg total) by mouth every 6 (six) hours as needed., Disp: 20 tablet, Rfl: 0   valsartan-hydrochlorothiazide (DIOVAN-HCT) 160-12.5 MG tablet, Take 1 tablet by mouth daily., Disp: 90 tablet, Rfl: 1   Medications ordered in this encounter:  Meds ordered this encounter  Medications   predniSONE (DELTASONE) 20 MG tablet    Sig: Take 2 tablets (40 mg total) by mouth daily with breakfast.    Dispense:  10 tablet    Refill:  0    Order Specific Question:   Supervising Provider    Answer:   MILLER, BRIAN [[id number]]   benzonatate (TESSALON) 100 MG capsule    Sig: Take 1 capsule (100 mg total) by mouth 3 (three) times daily as needed for cough.    Dispense:  30 capsule    Refill:  0    Order Specific Question:   Supervising Provider    Answer:   Sabra Heck, BRIAN [[id number]]     *If you need  refills on other medications prior to your next appointment, please contact your pharmacy*  Follow-Up: Call back or seek an in-person evaluation if the symptoms worsen or if the condition fails to improve as anticipated.  Other Instructions Please keep hydrated and rest. Continue your asthma and allergy medications.  Take the prednisone as directed, making sure to minimize carbohydrates. The Tessalon I have sent in will help with cough. It is safe to take along with OTC Mucinex-DM.  If not resolving or any new/worsening symptoms despite treatment, let us know or please be evaluated in person.    If you have been instructed to have an in-person evaluation today at a local Urgent Care  facility, please use the link below. It will take you to a list of all of our available [facility], including address, phone number and hours of operation. Please do not delay care.  [facility]  If you or a family member do not have a primary care provider, use the link below to schedule a visit and establish care. When you choose a [facility] primary care physician or advanced practice provider, you gain a long-term partner in health. Find a Primary Care Provider  Learn more about 's in-office and virtual care options: [facility]

## 2021-08-20 ENCOUNTER — Other Ambulatory Visit: Payer: Self-pay | Admitting: Family Medicine

## 2021-08-20 DIAGNOSIS — J4540 Moderate persistent asthma, uncomplicated: Secondary | ICD-10-CM

## 2021-08-24 ENCOUNTER — Other Ambulatory Visit: Payer: Self-pay | Admitting: Family Medicine

## 2021-08-24 DIAGNOSIS — E1142 Type 2 diabetes mellitus with diabetic polyneuropathy: Secondary | ICD-10-CM

## 2021-09-10 ENCOUNTER — Other Ambulatory Visit: Payer: Self-pay | Admitting: Family Medicine

## 2021-09-10 DIAGNOSIS — I10 Essential (primary) hypertension: Secondary | ICD-10-CM

## 2021-09-10 DIAGNOSIS — E1169 Type 2 diabetes mellitus with other specified complication: Secondary | ICD-10-CM

## 2021-09-10 DIAGNOSIS — J4540 Moderate persistent asthma, uncomplicated: Secondary | ICD-10-CM

## 2021-09-18 ENCOUNTER — Other Ambulatory Visit: Payer: Self-pay | Admitting: Family Medicine

## 2021-09-18 DIAGNOSIS — J4540 Moderate persistent asthma, uncomplicated: Secondary | ICD-10-CM

## 2021-09-19 NOTE — Progress Notes (Deleted)
Name: Kelsey Alvarez   MRN: [id number]    DOB: 05/28/1970   Date:09/19/2021       Progress Note  Subjective  Chief Complaint  Follow Up  HPI  DM:  hgbA1C was 7.9%, it went up to 8.6 % to 9 % and last level was down  6.3 %.  She was unable to tolerate Metformin or GLP-1 agonist , she is on [place] and started on Actos Fall 2022 .She still has neuropathy symptoms ( hands and feet) but doing better on gabapentin but has not been complaint with medication lately . She denies polyphagia, polydipsia or polyuria . She is also taking Crestor since she came in last time and we will recheck labs next visit    Morbid Obesity:off Ozempic, she stopped because she could not eat anything, she had lost 12 lbs in a short period of time,  we tried switching to Rybelsus but only able to tolerate 3 mg dose, her weight went down 8 lbs but also unable to tolerate medication. Weight is back up again from 266 lbs to 271 lbs. She needs to increase physical activity and eat smaller portions   Periumbilical pain: going on for over 4 months  pain is described as aching or cramping like and starts with first bite of food and takes 30-60 minutes after a meal to fell better. She states has loose bowel movements but that is normal to her. No blood in stools. Reminded her to follow up with GI    Asthma: she has moderate asthma, she is currently using Treleby and is doing well, has some SOB with strenous activity. She states symptoms not as controlled this time of the year. Having itchy eyes, throat and ear, noticing a dry cough. She is taking xyzal and singulair daily    Hyperlipidemia: she was on Atorvastatin but LDL was not at goal, she is now taking Rosuvastatin and has been compliant with daily medication , we will recheck next visit    HTN: taking Valsartan hctz and bp is at goal, no side effects. No chest pain oand intermittent palpitation but stable    Low back pain : used to be intermittent across lower back, but over  the past 6 weeks pain has been constant worse when standing still, and used to radiate to her left thigh, but she states lately radiating down below her knee and also went to [facility] for left flank pain radiating to pelvic area, had negative CT and labs. She has pain with extension of back, some dysuria and urinary frequency. Explained it may be radiculitis and needs to resume Gabapentin every night and we will treat for UTI also and monitor   Patient Active Problem List   Diagnosis Date Noted   Type 2 diabetes, controlled, with peripheral neuropathy ([place]) 01/20/2018   Endometriosis 11/06/2017   Menorrhagia with irregular cycle 10/28/2017   Fibroid uterus 10/28/2017   Carpal tunnel syndrome on both sides 10/17/2017   B12 deficiency 04/27/2016   History of endometriosis 11/17/2015   Oligomenorrhea 10/12/2015   Benign essential HTN 12/28/2014   Cervical polyp 12/28/2014   Controlled type 2 diabetes mellitus with microalbuminuria ([place]) 12/28/2014   Dyslipidemia 12/28/2014   Dysmenorrhea 12/28/2014   Edema 12/28/2014   Female infertility 12/28/2014   Gastro-esophageal reflux disease without esophagitis 12/28/2014   Morbid obesity ([place]) 12/28/2014   Asthma, moderate persistent, poorly-controlled 12/28/2014   Perennial allergic rhinitis 12/28/2014   Intermittent tremor 12/28/2014   Vitamin D deficiency 12/28/2014  Intermittent low back pain 12/28/2014   Left sciatic nerve pain 12/28/2014    Past Surgical History:  Procedure Laterality Date   APPENDECTOMY     cervical polyp removal     DILATION AND CURETTAGE OF UTERUS     HYSTEROSCOPY     HYSTEROSCOPY WITH D & C N/A 10/12/2015   Procedure: DILATATION AND CURETTAGE /HYSTEROSCOPY;  Surgeon: Will Bonnet, MD;  Location: [facility];  Service: Gynecology;  Laterality: N/A;   LAPAROSCOPIC BILATERAL SALPINGO OOPHERECTOMY N/A 10/12/2015   Procedure: LAPAROSCOPIC RIGHT SALPINGECTOMY, LEFT OOPHORECTOMY;  Surgeon: Will Bonnet, MD;  Location:  [facility];  Service: Gynecology;  Laterality: N/A;   LAPAROSCOPIC OVARIAN CYSTECTOMY Bilateral 10/12/2015   Procedure: LAPAROSCOPIC OVARIAN CYSTECTOMY;  Surgeon: Will Bonnet, MD;  Location: [facility];  Service: Gynecology;  Laterality: Bilateral;   OOPHORECTOMY     OVARIAN CYST REMOVAL      Family History  Problem Relation Age of Onset   Hypertension Mother    Diabetes Mother    CAD Mother    Cancer Mother        cervical   Heart attack Mother 41       Triple Bypass   CAD Father    Hypertension Father    Arthritis Father    Thyroid disease Sister    Alzheimer's disease Maternal Grandmother    Alzheimer's disease Maternal Grandfather    Breast cancer Paternal Grandmother     Social History   Tobacco Use   Smoking status: Former    Years: 1.00    Types: Cigarettes    Start date: 12/03/2001    Quit date: 12/28/2002    Years since quitting: 18.7   Smokeless tobacco: Never   Tobacco comments:    smoked 3 to 4 cigarrettes a day for a year  Substance Use Topics   Alcohol use: Yes    Alcohol/week: 0.0 standard drinks of alcohol    Comment: occasionally drinks margarita, once or twice a year     Current Outpatient Medications:    albuterol (VENTOLIN HFA) 108 (90 Base) MCG/ACT inhaler, TAKE 2 PUFFS BY MOUTH EVERY 6 HOURS AS NEEDED FOR WHEEZE OR SHORTNESS OF BREATH, Disp: 8.5 each, Rfl: 0   Alpha Lipoic Acid 200 MG CAPS, Take by mouth., Disp: , Rfl:    baclofen (LIORESAL) 10 MG tablet, Take 1 tablet (10 mg total) by mouth 2 (two) times daily., Disp: 60 each, Rfl: 0   benzonatate (TESSALON) 100 MG capsule, Take 1 capsule (100 mg total) by mouth 3 (three) times daily as needed for cough., Disp: 30 capsule, Rfl: 0   Cetirizine HCl (ZYRTEC PO), Take 10 mg by mouth daily., Disp: , Rfl:    Cholecalciferol (VITAMIN D) 2000 UNITS tablet, Take 1 tablet by mouth daily., Disp: , Rfl:    FARXIGA 10 MG TABS tablet, TAKE 1 TABLET BY MOUTH EVERY DAY BEFORE BREAKFAST, Disp: 90 tablet, Rfl:  0   fluticasone (FLONASE) 50 MCG/ACT nasal spray, Place 2 sprays into both nostrils daily., Disp: 16 g, Rfl: 5   Fluticasone-Umeclidin-Vilant (TRELEGY ELLIPTA) 100-62.5-25 MCG/ACT AEPB, Inhale 1 puff into the lungs daily., Disp: 1 each, Rfl: 3   gabapentin (NEURONTIN) 300 MG capsule, Take 1 capsule (300 mg total) by mouth at bedtime., Disp: 90 capsule, Rfl: 1   ipratropium-albuterol (DUONEB) 0.5-2.5 (3) MG/3ML SOLN, Take 3 mLs by nebulization every 4 (four) hours as needed., Disp: 120 mL, Rfl: 0   meloxicam (MOBIC) 15 MG tablet, Take 1  tablet (15 mg total) by mouth daily., Disp: 90 tablet, Rfl: 0   montelukast (SINGULAIR) 10 MG tablet, TAKE 1 TABLET BY MOUTH EVERYDAY AT BEDTIME, Disp: 90 tablet, Rfl: 1   pioglitazone (ACTOS) 15 MG tablet, TAKE 1 TABLET (15 MG TOTAL) BY MOUTH DAILY., Disp: 90 tablet, Rfl: 1   predniSONE (DELTASONE) 20 MG tablet, Take 2 tablets (40 mg total) by mouth daily with breakfast., Disp: 10 tablet, Rfl: 0   rosuvastatin (CRESTOR) 40 MG tablet, Take 1 tablet (40 mg total) by mouth daily., Disp: 90 tablet, Rfl: 1   traMADol (ULTRAM) 50 MG tablet, Take 1 tablet (50 mg total) by mouth every 6 (six) hours as needed., Disp: 20 tablet, Rfl: 0   valsartan-hydrochlorothiazide (DIOVAN-HCT) 160-12.5 MG tablet, TAKE 1 TABLET BY MOUTH EVERY DAY, Disp: 90 tablet, Rfl: 1  Allergies  Allergen Reactions   Fish Allergy Shortness Of Breath   Penicillins Anaphylaxis and Rash    Childhood allergy   Levofloxacin Other (See Comments)    Cramping   Adhesive [Tape] Rash    Paper tape ok to use.    I personally reviewed active problem list, medication list, allergies, family history, social history, health maintenance with the patient/caregiver today.   ROS  ***  Objective  There were no vitals filed for this visit.  There is no height or weight on file to calculate BMI.  Physical Exam ***  No results found for this or any previous visit (from the past 2160 hour(s)).  Diabetic  Foot Exam: Diabetic Foot Exam - Simple   No data filed    ***  PHQ2/9:    06/19/2021   11:33 AM 03/14/2021   10:06 AM 10/03/2020    9:32 AM 06/03/2020   10:26 AM 05/04/2020    2:09 PM  Depression screen PHQ 2/9  Decreased Interest 0 0 0 0 0  Down, Depressed, Hopeless 0 0 0 0 0  PHQ - 2 Score 0 0 0 0 0  Altered sleeping 0 0     Tired, decreased energy 0 0     Change in appetite 0 0     Feeling bad or failure about yourself  0 0     Trouble concentrating 0 0     Moving slowly or fidgety/restless 0 0     Suicidal thoughts 0 0     PHQ-9 Score 0 0       phq 9 is {gen pos [id number]}   Fall Risk:    06/19/2021   11:33 AM 03/14/2021   10:06 AM 10/03/2020    9:32 AM 06/03/2020   10:26 AM 05/04/2020    2:08 PM  Fall Risk   Falls in the past year? 0 0 0 0 0  Number falls in past yr: 0  0 0 0  Injury with Fall? 0  0 0 0  Risk for fall due to : No Fall Risks      Follow up Falls prevention discussed Falls prevention discussed         Functional Status Survey:      Assessment & Plan  *** There are no diagnoses linked to this encounter.

## 2021-09-20 ENCOUNTER — Ambulatory Visit: Payer: 59 | Admitting: Family Medicine

## 2021-10-04 ENCOUNTER — Other Ambulatory Visit: Payer: Self-pay | Admitting: Family Medicine

## 2021-10-04 DIAGNOSIS — J4540 Moderate persistent asthma, uncomplicated: Secondary | ICD-10-CM

## 2021-10-23 NOTE — Progress Notes (Deleted)
Name: Kelsey Alvarez   MRN: [id number]    DOB: 10/02/1970   Date:10/23/2021       Progress Note  Subjective  Chief Complaint  Follow Up  HPI  DM:  hgbA1C was 7.9%, it went up to 8.6 % to 9 % and last level was down  6.3 %.  She was unable to tolerate Metformin or GLP-1 agonist , she is on [place] and started on Actos Fall 2022 .She still has neuropathy symptoms ( hands and feet) but doing better on gabapentin but has not been complaint with medication lately . She denies polyphagia, polydipsia or polyuria . She is also taking Crestor since she came in last time and we will recheck labs next visit    Morbid Obesity:off Ozempic, she stopped because she could not eat anything, she had lost 12 lbs in a short period of time,  we tried switching to Rybelsus but only able to tolerate 3 mg dose, her weight went down 8 lbs but also unable to tolerate medication. Weight is back up again from 266 lbs to 271 lbs. She needs to increase physical activity and eat smaller portions   Periumbilical pain: going on for over 4 months  pain is described as aching or cramping like and starts with first bite of food and takes 30-60 minutes after a meal to fell better. She states has loose bowel movements but that is normal to her. No blood in stools. Reminded her to follow up with GI    Asthma: she has moderate asthma, she is currently using Treleby and is doing well, has some SOB with strenous activity. She states symptoms not as controlled this time of the year. Having itchy eyes, throat and ear, noticing a dry cough. She is taking xyzal and singulair daily    Hyperlipidemia: she was on Atorvastatin but LDL was not at goal, she is now taking Rosuvastatin and has been compliant with daily medication , we will recheck next visit    HTN: taking Valsartan hctz and bp is at goal, no side effects. No chest pain oand intermittent palpitation but stable    Low back pain : used to be intermittent across lower back, but over  the past 6 weeks pain has been constant worse when standing still, and used to radiate to her left thigh, but she states lately radiating down below her knee and also went to [facility] for left flank pain radiating to pelvic area, had negative CT and labs. She has pain with extension of back, some dysuria and urinary frequency. Explained it may be radiculitis and needs to resume Gabapentin every night and we will treat for UTI also and monitor   Patient Active Problem List   Diagnosis Date Noted   Type 2 diabetes, controlled, with peripheral neuropathy ([place]) 01/20/2018   Endometriosis 11/06/2017   Menorrhagia with irregular cycle 10/28/2017   Fibroid uterus 10/28/2017   Carpal tunnel syndrome on both sides 10/17/2017   B12 deficiency 04/27/2016   History of endometriosis 11/17/2015   Oligomenorrhea 10/12/2015   Benign essential HTN 12/28/2014   Cervical polyp 12/28/2014   Controlled type 2 diabetes mellitus with microalbuminuria ([place]) 12/28/2014   Dyslipidemia 12/28/2014   Dysmenorrhea 12/28/2014   Edema 12/28/2014   Female infertility 12/28/2014   Gastro-esophageal reflux disease without esophagitis 12/28/2014   Morbid obesity ([place]) 12/28/2014   Asthma, moderate persistent, poorly-controlled 12/28/2014   Perennial allergic rhinitis 12/28/2014   Intermittent tremor 12/28/2014   Vitamin D deficiency 12/28/2014  Intermittent low back pain 12/28/2014   Left sciatic nerve pain 12/28/2014    Past Surgical History:  Procedure Laterality Date   APPENDECTOMY     cervical polyp removal     DILATION AND CURETTAGE OF UTERUS     HYSTEROSCOPY     HYSTEROSCOPY WITH D & C N/A 10/12/2015   Procedure: DILATATION AND CURETTAGE /HYSTEROSCOPY;  Surgeon: Will Bonnet, MD;  Location: [facility];  Service: Gynecology;  Laterality: N/A;   LAPAROSCOPIC BILATERAL SALPINGO OOPHERECTOMY N/A 10/12/2015   Procedure: LAPAROSCOPIC RIGHT SALPINGECTOMY, LEFT OOPHORECTOMY;  Surgeon: Will Bonnet, MD;  Location:  [facility];  Service: Gynecology;  Laterality: N/A;   LAPAROSCOPIC OVARIAN CYSTECTOMY Bilateral 10/12/2015   Procedure: LAPAROSCOPIC OVARIAN CYSTECTOMY;  Surgeon: Will Bonnet, MD;  Location: [facility];  Service: Gynecology;  Laterality: Bilateral;   OOPHORECTOMY     OVARIAN CYST REMOVAL      Family History  Problem Relation Age of Onset   Hypertension Mother    Diabetes Mother    CAD Mother    Cancer Mother        cervical   Heart attack Mother 48       Triple Bypass   CAD Father    Hypertension Father    Arthritis Father    Thyroid disease Sister    Alzheimer's disease Maternal Grandmother    Alzheimer's disease Maternal Grandfather    Breast cancer Paternal Grandmother     Social History   Tobacco Use   Smoking status: Former    Years: 1.00    Types: Cigarettes    Start date: 12/03/2001    Quit date: 12/28/2002    Years since quitting: 18.8   Smokeless tobacco: Never   Tobacco comments:    smoked 3 to 4 cigarrettes a day for a year  Substance Use Topics   Alcohol use: Yes    Alcohol/week: 0.0 standard drinks of alcohol    Comment: occasionally drinks margarita, once or twice a year     Current Outpatient Medications:    albuterol (VENTOLIN HFA) 108 (90 Base) MCG/ACT inhaler, TAKE 2 PUFFS BY MOUTH EVERY 6 HOURS AS NEEDED FOR WHEEZE OR SHORTNESS OF BREATH, Disp: 8.5 each, Rfl: 0   Alpha Lipoic Acid 200 MG CAPS, Take by mouth., Disp: , Rfl:    baclofen (LIORESAL) 10 MG tablet, Take 1 tablet (10 mg total) by mouth 2 (two) times daily., Disp: 60 each, Rfl: 0   benzonatate (TESSALON) 100 MG capsule, Take 1 capsule (100 mg total) by mouth 3 (three) times daily as needed for cough., Disp: 30 capsule, Rfl: 0   Cetirizine HCl (ZYRTEC PO), Take 10 mg by mouth daily., Disp: , Rfl:    Cholecalciferol (VITAMIN D) 2000 UNITS tablet, Take 1 tablet by mouth daily., Disp: , Rfl:    FARXIGA 10 MG TABS tablet, TAKE 1 TABLET BY MOUTH EVERY DAY BEFORE BREAKFAST, Disp: 90 tablet, Rfl:  0   fluticasone (FLONASE) 50 MCG/ACT nasal spray, Place 2 sprays into both nostrils daily., Disp: 16 g, Rfl: 5   Fluticasone-Umeclidin-Vilant (TRELEGY ELLIPTA) 100-62.5-25 MCG/ACT AEPB, Inhale 1 puff into the lungs daily., Disp: 1 each, Rfl: 3   gabapentin (NEURONTIN) 300 MG capsule, Take 1 capsule (300 mg total) by mouth at bedtime., Disp: 90 capsule, Rfl: 1   ipratropium-albuterol (DUONEB) 0.5-2.5 (3) MG/3ML SOLN, Take 3 mLs by nebulization every 4 (four) hours as needed., Disp: 120 mL, Rfl: 0   meloxicam (MOBIC) 15 MG tablet, Take 1  tablet (15 mg total) by mouth daily., Disp: 90 tablet, Rfl: 0   montelukast (SINGULAIR) 10 MG tablet, TAKE 1 TABLET BY MOUTH EVERYDAY AT BEDTIME, Disp: 90 tablet, Rfl: 1   pioglitazone (ACTOS) 15 MG tablet, TAKE 1 TABLET (15 MG TOTAL) BY MOUTH DAILY., Disp: 90 tablet, Rfl: 1   predniSONE (DELTASONE) 20 MG tablet, Take 2 tablets (40 mg total) by mouth daily with breakfast., Disp: 10 tablet, Rfl: 0   rosuvastatin (CRESTOR) 40 MG tablet, Take 1 tablet (40 mg total) by mouth daily., Disp: 90 tablet, Rfl: 1   traMADol (ULTRAM) 50 MG tablet, Take 1 tablet (50 mg total) by mouth every 6 (six) hours as needed., Disp: 20 tablet, Rfl: 0   valsartan-hydrochlorothiazide (DIOVAN-HCT) 160-12.5 MG tablet, TAKE 1 TABLET BY MOUTH EVERY DAY, Disp: 90 tablet, Rfl: 1  Allergies  Allergen Reactions   Fish Allergy Shortness Of Breath   Penicillins Anaphylaxis and Rash    Childhood allergy   Levofloxacin Other (See Comments)    Cramping   Adhesive [Tape] Rash    Paper tape ok to use.    I personally reviewed active problem list, medication list, allergies, family history, social history, health maintenance with the patient/caregiver today.   ROS  ***  Objective  There were no vitals filed for this visit.  There is no height or weight on file to calculate BMI.  Physical Exam ***  No results found for this or any previous visit (from the past 2160 hour(s)).  Diabetic  Foot Exam: Diabetic Foot Exam - Simple   No data filed    ***  PHQ2/9:    06/19/2021   11:33 AM 03/14/2021   10:06 AM 10/03/2020    9:32 AM 06/03/2020   10:26 AM 05/04/2020    2:09 PM  Depression screen PHQ 2/9  Decreased Interest 0 0 0 0 0  Down, Depressed, Hopeless 0 0 0 0 0  PHQ - 2 Score 0 0 0 0 0  Altered sleeping 0 0     Tired, decreased energy 0 0     Change in appetite 0 0     Feeling bad or failure about yourself  0 0     Trouble concentrating 0 0     Moving slowly or fidgety/restless 0 0     Suicidal thoughts 0 0     PHQ-9 Score 0 0       phq 9 is {gen pos [id number]}   Fall Risk:    06/19/2021   11:33 AM 03/14/2021   10:06 AM 10/03/2020    9:32 AM 06/03/2020   10:26 AM 05/04/2020    2:08 PM  Fall Risk   Falls in the past year? 0 0 0 0 0  Number falls in past yr: 0  0 0 0  Injury with Fall? 0  0 0 0  Risk for fall due to : No Fall Risks      Follow up Falls prevention discussed Falls prevention discussed         Functional Status Survey:      Assessment & Plan  *** There are no diagnoses linked to this encounter.

## 2021-10-24 ENCOUNTER — Ambulatory Visit: Payer: 59 | Admitting: Family Medicine

## 2021-11-03 NOTE — Progress Notes (Unsigned)
Name: Kelsey Alvarez   MRN: [id number]    DOB: [birth year]   Date:11/07/2021       Progress Note  Subjective  Chief Complaint  Follow Up  HPI  DM:  hgbA1C was 7.9%, it went up to 8.6 % to 9 % 6.3 % , 6.5 % and today is down to 6.2 %   She was unable to tolerate Metformin or GLP-1 agonist , she is on Farxiga and started on Actos Fall 2022 .She still has neuropathy symptoms ( hands and feet)  she is not taking gabapentin due to sedation at 300 mg dose, we will try lower dose at 100 mg twice a day and 200 mg at night  . She denies polyphagia, polydipsia or polyuria . She has not been compliant with crestor lately    Morbid Obesity:off Ozempic, she stopped because she could not eat anything, she had lost 12 lbs in a short period of time,  we tried switching to Rybelsus but only able to tolerate 3 mg dose, her weight went down 8 lbs but also unable to tolerate medication. Weight is back up again from 266 lbs to 271 lbs and today is 281 lbs. She cannot tolerated GLP-1 agonist   Periumbilical pain: going on for over 8 months  pain is described as aching or cramping like , she states no longer triggered by eating.  No change in bowel movements . No blood in stools. Discussed again importance of seeing GI    Asthma: she has moderate asthma, she is currently using [place] and is doing well, has some SOB with strenous activity. Symptoms worse when outdoors    Hyperlipidemia: she was on Atorvastatin but LDL was not at goal, she is now taking Rosuvastatin but no longer taking it daily due to morning headache. We will try pravastatin and recheck next visit    HTN: taking Valsartan hctz  no side effects. No chest pain oand intermittent palpitation but stable . BP is at goal    Low back pain : used to be intermittent across lower back, but worse over the past 4 months, when standing it radiates to lateral thighs. Last visit we gave her Meloxicam and gabapentin, but 300 mg makes her feel groggy in the mornings.  Discussed PT   Otalgia and tinnitus: both ears but worse on left side, going on for the past week. No rhinorrhea but has been feeling nasal congestion, she has not been using her nasal spray and advised her to take it daily  Patient Active Problem List   Diagnosis Date Noted   Type 2 diabetes, controlled, with peripheral neuropathy ([place]) 01/20/2018   Endometriosis 11/06/2017   Menorrhagia with irregular cycle 10/28/2017   Fibroid uterus 10/28/2017   Carpal tunnel syndrome on both sides 10/17/2017   B12 deficiency 04/27/2016   History of endometriosis 11/17/2015   Oligomenorrhea 10/12/2015   Benign essential HTN 12/28/2014   Cervical polyp 12/28/2014   Controlled type 2 diabetes mellitus with microalbuminuria ([place]) 12/28/2014   Dyslipidemia 12/28/2014   Dysmenorrhea 12/28/2014   Edema 12/28/2014   Female infertility 12/28/2014   Gastro-esophageal reflux disease without esophagitis 12/28/2014   Morbid obesity ([place]) 12/28/2014   Asthma, moderate persistent, poorly-controlled 12/28/2014   Perennial allergic rhinitis 12/28/2014   Intermittent tremor 12/28/2014   Vitamin D deficiency 12/28/2014   Intermittent low back pain 12/28/2014   Left sciatic nerve pain 12/28/2014    Past Surgical History:  Procedure Laterality Date   APPENDECTOMY  cervical polyp removal     DILATION AND CURETTAGE OF UTERUS     HYSTEROSCOPY     HYSTEROSCOPY WITH D & C N/A 10/12/2015   Procedure: DILATATION AND CURETTAGE /HYSTEROSCOPY;  Surgeon: Will Bonnet, MD;  Location: [facility];  Service: Gynecology;  Laterality: N/A;   LAPAROSCOPIC BILATERAL SALPINGO OOPHERECTOMY N/A 10/12/2015   Procedure: LAPAROSCOPIC RIGHT SALPINGECTOMY, LEFT OOPHORECTOMY;  Surgeon: Will Bonnet, MD;  Location: [facility];  Service: Gynecology;  Laterality: N/A;   LAPAROSCOPIC OVARIAN CYSTECTOMY Bilateral 10/12/2015   Procedure: LAPAROSCOPIC OVARIAN CYSTECTOMY;  Surgeon: Will Bonnet, MD;  Location: [facility];  Service:  Gynecology;  Laterality: Bilateral;   OOPHORECTOMY     OVARIAN CYST REMOVAL      Family History  Problem Relation Age of Onset   Hypertension Mother    Diabetes Mother    CAD Mother    Cancer Mother        cervical   Heart attack Mother 31       Triple Bypass   CAD Father    Hypertension Father    Arthritis Father    Thyroid disease Sister    Alzheimer's disease Maternal Grandmother    Alzheimer's disease Maternal Grandfather    Breast cancer Paternal Grandmother     Social History   Tobacco Use   Smoking status: Former    Years: 1.00    Types: Cigarettes    Start date: 12/03/2001    Quit date: 12/28/2002    Years since quitting: 18.8   Smokeless tobacco: Never   Tobacco comments:    smoked 3 to 4 cigarrettes a day for a year  Substance Use Topics   Alcohol use: Yes    Alcohol/week: 0.0 standard drinks of alcohol    Comment: occasionally drinks margarita, once or twice a year     Current Outpatient Medications:    albuterol (VENTOLIN HFA) 108 (90 Base) MCG/ACT inhaler, TAKE 2 PUFFS BY MOUTH EVERY 6 HOURS AS NEEDED FOR WHEEZE OR SHORTNESS OF BREATH, Disp: 8.5 each, Rfl: 0   Alpha Lipoic Acid 200 MG CAPS, Take by mouth., Disp: , Rfl:    baclofen (LIORESAL) 10 MG tablet, Take 1 tablet (10 mg total) by mouth 2 (two) times daily., Disp: 60 each, Rfl: 0   Cetirizine HCl (ZYRTEC PO), Take 10 mg by mouth daily., Disp: , Rfl:    Cholecalciferol (VITAMIN D) 2000 UNITS tablet, Take 1 tablet by mouth daily., Disp: , Rfl:    FARXIGA 10 MG TABS tablet, TAKE 1 TABLET BY MOUTH EVERY DAY BEFORE BREAKFAST, Disp: 90 tablet, Rfl: 0   fluticasone (FLONASE) 50 MCG/ACT nasal spray, Place 2 sprays into both nostrils daily., Disp: 16 g, Rfl: 5   Fluticasone-Umeclidin-Vilant (TRELEGY ELLIPTA) 100-62.5-25 MCG/ACT AEPB, Inhale 1 puff into the lungs daily., Disp: 1 each, Rfl: 3   gabapentin (NEURONTIN) 300 MG capsule, Take 1 capsule (300 mg total) by mouth at bedtime., Disp: 90 capsule, Rfl: 1    ipratropium-albuterol (DUONEB) 0.5-2.5 (3) MG/3ML SOLN, Take 3 mLs by nebulization every 4 (four) hours as needed., Disp: 120 mL, Rfl: 0   meloxicam (MOBIC) 15 MG tablet, Take 1 tablet (15 mg total) by mouth daily., Disp: 90 tablet, Rfl: 0   montelukast (SINGULAIR) 10 MG tablet, TAKE 1 TABLET BY MOUTH EVERYDAY AT BEDTIME, Disp: 90 tablet, Rfl: 1   pioglitazone (ACTOS) 15 MG tablet, TAKE 1 TABLET (15 MG TOTAL) BY MOUTH DAILY., Disp: 90 tablet, Rfl: 1   rosuvastatin (CRESTOR)  40 MG tablet, Take 1 tablet (40 mg total) by mouth daily., Disp: 90 tablet, Rfl: 1   traMADol (ULTRAM) 50 MG tablet, Take 1 tablet (50 mg total) by mouth every 6 (six) hours as needed., Disp: 20 tablet, Rfl: 0   valsartan-hydrochlorothiazide (DIOVAN-HCT) 160-12.5 MG tablet, TAKE 1 TABLET BY MOUTH EVERY DAY, Disp: 90 tablet, Rfl: 1  Allergies  Allergen Reactions   Fish Allergy Shortness Of Breath   Penicillins Anaphylaxis and Rash    Childhood allergy   Levofloxacin Other (See Comments)    Cramping   Adhesive [Tape] Rash    Paper tape ok to use.    I personally reviewed active problem list, medication list, allergies, family history, social history, health maintenance with the patient/caregiver today.   ROS  Constitutional: Negative for fever, positive for  weight change.  Respiratory: Negative for cough and shortness of breath.   Cardiovascular: Negative for chest pain or palpitations.  Gastrointestinal: Negative for abdominal pain, no bowel changes.  Musculoskeletal: Negative for gait problem or joint swelling.  Skin: Negative for rash.  Neurological: Negative for dizziness or headache.  No other specific complaints in a complete review of systems (except as listed in HPI above).   Objective  Vitals:   11/07/21 0811  BP: 126/76  Pulse: 86  Resp: 16  SpO2: 98%  Weight: 281 lb (127.5 kg)  Height: '5\' 2"'$  (1.575 m)    Body mass index is 51.4 kg/m.  Physical Exam  Constitutional: Patient appears  well-developed and well-nourished. Obese  No distress.  HEENT: head atraumatic, normocephalic, pupils equal and reactive to light, neck supple. Positive for air fluid levels on left ear  Cardiovascular: Normal rate, regular rhythm and normal heart sounds.  No murmur heard. No BLE edema. Pulmonary/Chest: Effort normal and breath sounds normal. No respiratory distress. Abdominal: Soft.  There is no tenderness. Psychiatric: Patient has a normal mood and affect. behavior is normal. Judgment and thought content normal.   Recent Results (from the past 2160 hour(s))  POCT HgB A1C     Status: Abnormal   Collection Time: 11/07/21  8:22 AM  Result Value Ref Range   Hemoglobin A1C 6.2 (A) 4.0 - 5.6 %   HbA1c POC (<> result, manual entry)     HbA1c, POC (prediabetic range)     HbA1c, POC (controlled diabetic range)       PHQ2/9:    11/07/2021    8:20 AM 06/19/2021   11:33 AM 03/14/2021   10:06 AM 10/03/2020    9:32 AM 06/03/2020   10:26 AM  Depression screen PHQ 2/9  Decreased Interest 0 0 0 0 0  Down, Depressed, Hopeless 0 0 0 0 0  PHQ - 2 Score 0 0 0 0 0  Altered sleeping 3 0 0    Tired, decreased energy 0 0 0    Change in appetite 0 0 0    Feeling bad or failure about yourself  0 0 0    Trouble concentrating 0 0 0    Moving slowly or fidgety/restless 0 0 0    Suicidal thoughts 0 0 0    PHQ-9 Score 3 0 0      phq 9 is negative   Fall Risk:    11/07/2021    8:20 AM 06/19/2021   11:33 AM 03/14/2021   10:06 AM 10/03/2020    9:32 AM 06/03/2020   10:26 AM  Fall Risk   Falls in the past year? 0 0 0 0  0  Number falls in past yr: 0 0  0 0  Injury with Fall? 0 0  0 0  Risk for fall due to : No Fall Risks No Fall Risks     Follow up Falls prevention discussed Falls prevention discussed Falls prevention discussed      Functional Status Survey: Is the patient deaf or have difficulty hearing?: No Does the patient have difficulty seeing, even when wearing glasses/contacts?: No Does the patient  have difficulty concentrating, remembering, or making decisions?: No Does the patient have difficulty walking or climbing stairs?: Yes Does the patient have difficulty dressing or bathing?: No Does the patient have difficulty doing errands alone such as visiting a doctor's office or shopping?: No    Assessment & Plan  1. Type 2 diabetes, controlled, with peripheral neuropathy ([place])  She is gaining weight could not tolerate Ozempic or Rybelsus in the past but she is thinking she may be able to tolerate lower doses, she will think about it and contact me back  - POCT HgB A1C  2. Need for immunization against influenza  - Flu Vaccine QUAD 6+ mos PF IM (Fluarix Quad PF)  3. Back pain with radiculopathy  - meloxicam (MOBIC) 15 MG tablet; Take 1 tablet (15 mg total) by mouth daily.  Dispense: 90 tablet; Refill: 0 - Ambulatory referral to Orthopedic Surgery  4. Dyslipidemia associated with type 2 diabetes mellitus (H[facility])  Stop Rosuvastatin and start Pravastatin daily   5. Vitamin D deficiency   6. B12 deficiency   7. Perennial allergic rhinitis   8. Morbid obesity ([place])  Discussed with the patient the risk posed by an increased BMI. Discussed importance of portion control, calorie counting and at least 150 minutes of physical activity weekly. Avoid sweet beverages and drink more water. Eat at least 6 servings of fruit and vegetables daily    9. Asthma, moderate persistent, well-controlled   10. Gastro-esophageal reflux disease without esophagitis   11. Tinnitus of both ears  Resume nasal spray daily and let me know if worse, she is not ready to go to ENT at this time   12. Otalgia, bilateral   13. Effusion of left knee  - Ambulatory referral to Orthopedic Surgery

## 2021-11-04 ENCOUNTER — Other Ambulatory Visit: Payer: Self-pay | Admitting: Family Medicine

## 2021-11-04 DIAGNOSIS — J4540 Moderate persistent asthma, uncomplicated: Secondary | ICD-10-CM

## 2021-11-07 ENCOUNTER — Ambulatory Visit: Payer: 59 | Admitting: Family Medicine

## 2021-11-07 ENCOUNTER — Other Ambulatory Visit: Payer: Self-pay

## 2021-11-07 ENCOUNTER — Encounter: Payer: Self-pay | Admitting: Family Medicine

## 2021-11-07 VITALS — BP 126/76 | HR 86 | Resp 16 | Ht 62.0 in | Wt 281.0 lb

## 2021-11-07 DIAGNOSIS — M5410 Radiculopathy, site unspecified: Secondary | ICD-10-CM | POA: Diagnosis not present

## 2021-11-07 DIAGNOSIS — H9313 Tinnitus, bilateral: Secondary | ICD-10-CM

## 2021-11-07 DIAGNOSIS — E1142 Type 2 diabetes mellitus with diabetic polyneuropathy: Secondary | ICD-10-CM

## 2021-11-07 DIAGNOSIS — E538 Deficiency of other specified B group vitamins: Secondary | ICD-10-CM

## 2021-11-07 DIAGNOSIS — Z23 Encounter for immunization: Secondary | ICD-10-CM | POA: Diagnosis not present

## 2021-11-07 DIAGNOSIS — K219 Gastro-esophageal reflux disease without esophagitis: Secondary | ICD-10-CM

## 2021-11-07 DIAGNOSIS — E1169 Type 2 diabetes mellitus with other specified complication: Secondary | ICD-10-CM | POA: Diagnosis not present

## 2021-11-07 DIAGNOSIS — J3089 Other allergic rhinitis: Secondary | ICD-10-CM

## 2021-11-07 DIAGNOSIS — J4540 Moderate persistent asthma, uncomplicated: Secondary | ICD-10-CM

## 2021-11-07 DIAGNOSIS — M25462 Effusion, left knee: Secondary | ICD-10-CM

## 2021-11-07 DIAGNOSIS — E559 Vitamin D deficiency, unspecified: Secondary | ICD-10-CM

## 2021-11-07 DIAGNOSIS — E785 Hyperlipidemia, unspecified: Secondary | ICD-10-CM

## 2021-11-07 DIAGNOSIS — H9203 Otalgia, bilateral: Secondary | ICD-10-CM

## 2021-11-07 LAB — POCT GLYCOSYLATED HEMOGLOBIN (HGB A1C): Hemoglobin A1C: 6.2 % — AB (ref 4.0–5.6)

## 2021-11-07 MED ORDER — GABAPENTIN 100 MG PO CAPS: 100.0000 mg | ORAL_CAPSULE | Freq: Three times a day (TID) | ORAL | 0 refills | Status: DC

## 2021-11-07 MED ORDER — MELOXICAM 15 MG PO TABS: 15.0000 mg | ORAL_TABLET | Freq: Every day | ORAL | 0 refills | Status: DC

## 2021-11-07 MED ORDER — PRAVASTATIN SODIUM 40 MG PO TABS: 40.0000 mg | ORAL_TABLET | Freq: Every day | ORAL | 0 refills | Status: DC

## 2021-12-05 ENCOUNTER — Telehealth: Payer: 59 | Admitting: Physician Assistant

## 2021-12-05 DIAGNOSIS — B9689 Other specified bacterial agents as the cause of diseases classified elsewhere: Secondary | ICD-10-CM | POA: Diagnosis not present

## 2021-12-05 DIAGNOSIS — J019 Acute sinusitis, unspecified: Secondary | ICD-10-CM

## 2021-12-05 MED ORDER — DOXYCYCLINE HYCLATE 100 MG PO TABS: 100.0000 mg | ORAL_TABLET | Freq: Two times a day (BID) | ORAL | 0 refills | Status: DC

## 2021-12-05 NOTE — Progress Notes (Signed)
Virtual Visit Consent   Kelsey Alvarez, you are scheduled for a virtual visit with a [facility] provider today. Just as with appointments in the office, your consent must be obtained to participate. Your consent will be active for this visit and any virtual visit you may have with one of our providers in the next 365 days. If you have a MyChart account, a copy of this consent can be sent to you electronically.  As this is a virtual visit, video technology does not allow for your provider to perform a traditional examination. This may limit your provider's ability to fully assess your condition. If your provider identifies any concerns that need to be evaluated in person or the need to arrange testing (such as labs, EKG, etc.), we will make arrangements to do so. Although advances in technology are sophisticated, we cannot ensure that it will always work on either your end or our end. If the connection with a video visit is poor, the visit may have to be switched to a telephone visit. With either a video or telephone visit, we are not always able to ensure that we have a secure connection.  By engaging in this virtual visit, you consent to the provision of healthcare and authorize for your insurance to be billed (if applicable) for the services provided during this visit. Depending on your insurance coverage, you may receive a charge related to this service.  I need to obtain your verbal consent now. Are you willing to proceed with your visit today? Kelsey Alvarez has provided verbal consent on 12/05/2021 for a virtual visit (video or telephone). Leeanne Rio, [place]  Date: 12/05/2021 4:32 PM  Virtual Visit via Video Note   I, Leeanne Rio, connected with  Kelsey Alvarez  ([id number], Nov 12, 1970) on 12/05/21 at  4:30 PM EDT by a video-enabled telemedicine application and verified that I am speaking with the correct person using two identifiers.  Location: Patient: Virtual Visit Location  Patient: Home Provider: Virtual Visit Location Provider: Home Office   I discussed the limitations of evaluation and management by telemedicine and the availability of in person appointments. The patient expressed understanding and agreed to proceed.    History of Present Illness: Kelsey Alvarez is a 51 y.o. who identifies as a female who was assigned female at birth, and is being seen today for concerns of persistent sinusitis.  Patient endorses a few weeks of nasal and head congestion, scratchy throat followed by sinus pain and bilateral ear pain.  Was evaluated at PCP office on 12/01/2021 at which time she was diagnosed with sinusitis and bilateral otitis media.  Was prescribed azithromycin and prednisone.  Endorses taking as directed with some noted initial improvement.  Ears are much better but sinus symptoms have persisted including pretty significant sinus pain.  Denies fever at present.  Denies shortness of breath.  Has been taking Tylenol over-the-counter along with her daily Zyrtec and Flonase.Marland Kitchen  HPI: HPI  Problems:  Patient Active Problem List   Diagnosis Date Noted   Type 2 diabetes, controlled, with peripheral neuropathy ([place]) 01/20/2018   Endometriosis 11/06/2017   Menorrhagia with irregular cycle 10/28/2017   Fibroid uterus 10/28/2017   Carpal tunnel syndrome on both sides 10/17/2017   B12 deficiency 04/27/2016   History of endometriosis 11/17/2015   Oligomenorrhea 10/12/2015   Benign essential HTN 12/28/2014   Cervical polyp 12/28/2014   Controlled type 2 diabetes mellitus with microalbuminuria ([place]) 12/28/2014   Dyslipidemia 12/28/2014   Dysmenorrhea 12/28/2014  Edema 12/28/2014   Female infertility 12/28/2014   Gastro-esophageal reflux disease without esophagitis 12/28/2014   Morbid obesity ([place]) 12/28/2014   Asthma, moderate persistent, poorly-controlled 12/28/2014   Perennial allergic rhinitis 12/28/2014   Intermittent tremor 12/28/2014   Vitamin D deficiency  12/28/2014   Intermittent low back pain 12/28/2014   Left sciatic nerve pain 12/28/2014    Allergies:  Allergies  Allergen Reactions   Fish Allergy Shortness Of Breath   Penicillins Anaphylaxis and Rash    Childhood allergy   Crestor [Rosuvastatin]     Headache    Levofloxacin Other (See Comments)    Cramping   Adhesive [Tape] Rash    Paper tape ok to use.   Medications:  Current Outpatient Medications:    doxycycline (VIBRA-TABS) 100 MG tablet, Take 1 tablet (100 mg total) by mouth 2 (two) times daily., Disp: 20 tablet, Rfl: 0   albuterol (VENTOLIN HFA) 108 (90 Base) MCG/ACT inhaler, TAKE 2 PUFFS BY MOUTH EVERY 6 HOURS AS NEEDED FOR WHEEZE OR SHORTNESS OF BREATH, Disp: 8.5 each, Rfl: 0   Alpha Lipoic Acid 200 MG CAPS, Take by mouth., Disp: , Rfl:    baclofen (LIORESAL) 10 MG tablet, Take 1 tablet (10 mg total) by mouth 2 (two) times daily., Disp: 60 each, Rfl: 0   Cetirizine HCl (ZYRTEC PO), Take 10 mg by mouth daily., Disp: , Rfl:    Cholecalciferol (VITAMIN D) 2000 UNITS tablet, Take 1 tablet by mouth daily., Disp: , Rfl:    FARXIGA 10 MG TABS tablet, TAKE 1 TABLET BY MOUTH EVERY DAY BEFORE BREAKFAST, Disp: 90 tablet, Rfl: 0   fluticasone (FLONASE) 50 MCG/ACT nasal spray, Place 2 sprays into both nostrils daily., Disp: 16 g, Rfl: 5   Fluticasone-Umeclidin-Vilant (TRELEGY ELLIPTA) 100-62.5-25 MCG/ACT AEPB, Inhale 1 puff into the lungs daily., Disp: 1 each, Rfl: 3   gabapentin (NEURONTIN) 100 MG capsule, Take 1-2 capsules (100-200 mg total) by mouth 3 (three) times daily. 100 mg morning and afternoon and two at night, Disp: 120 capsule, Rfl: 0   ipratropium-albuterol (DUONEB) 0.5-2.5 (3) MG/3ML SOLN, Take 3 mLs by nebulization every 4 (four) hours as needed., Disp: 120 mL, Rfl: 0   meloxicam (MOBIC) 15 MG tablet, Take 1 tablet (15 mg total) by mouth daily., Disp: 90 tablet, Rfl: 0   montelukast (SINGULAIR) 10 MG tablet, TAKE 1 TABLET BY MOUTH EVERYDAY AT BEDTIME, Disp: 90 tablet, Rfl:  1   pioglitazone (ACTOS) 15 MG tablet, TAKE 1 TABLET (15 MG TOTAL) BY MOUTH DAILY., Disp: 90 tablet, Rfl: 1   pravastatin (PRAVACHOL) 40 MG tablet, Take 1 tablet (40 mg total) by mouth daily., Disp: 90 tablet, Rfl: 0   valsartan-hydrochlorothiazide (DIOVAN-HCT) 160-12.5 MG tablet, TAKE 1 TABLET BY MOUTH EVERY DAY, Disp: 90 tablet, Rfl: 1  Observations/Objective: Patient is well-developed, well-nourished in no acute distress.  Resting comfortably at home.  Head is normocephalic, atraumatic.  No labored breathing. Speech is clear and coherent with logical content.  Patient is alert and oriented at baseline.   Assessment and Plan: 1. Acute bacterial sinusitis - doxycycline (VIBRA-TABS) 100 MG tablet; Take 1 tablet (100 mg total) by mouth 2 (two) times daily.  Dispense: 20 tablet; Refill: 0  Rx doxycycline.  Increase fluids.  Rest.  Saline nasal spray.  Probiotic.  Mucinex as directed.  Humidifier in bedroom.  OTC medications discussed.  Continue daily Zyrtec and Flonase.  Call or return to clinic if symptoms are not improving.   Follow Up Instructions: I discussed  the assessment and treatment plan with the patient. The patient was provided an opportunity to ask questions and all were answered. The patient agreed with the plan and demonstrated an understanding of the instructions.  A copy of instructions were sent to the patient via MyChart unless otherwise noted below.   The patient was advised to call back or seek an in-person evaluation if the symptoms worsen or if the condition fails to improve as anticipated.  Time:  I spent 10 minutes with the patient via telehealth technology discussing the above problems/concerns.    Leeanne Rio, PA-C

## 2021-12-05 NOTE — Patient Instructions (Signed)
[name], thank you for joining Leeanne Rio, PA-C for today's virtual visit.  While this provider is not your primary care provider (PCP), if your PCP is located in our provider database this encounter information will be shared with them immediately following your visit.  Consent: (Patient) Kelsey Alvarez provided verbal consent for this virtual visit at the beginning of the encounter.  Current Medications:  Current Outpatient Medications:    albuterol (VENTOLIN HFA) 108 (90 Base) MCG/ACT inhaler, TAKE 2 PUFFS BY MOUTH EVERY 6 HOURS AS NEEDED FOR WHEEZE OR SHORTNESS OF BREATH, Disp: 8.5 each, Rfl: 0   Alpha Lipoic Acid 200 MG CAPS, Take by mouth., Disp: , Rfl:    baclofen (LIORESAL) 10 MG tablet, Take 1 tablet (10 mg total) by mouth 2 (two) times daily., Disp: 60 each, Rfl: 0   Cetirizine HCl (ZYRTEC PO), Take 10 mg by mouth daily., Disp: , Rfl:    Cholecalciferol (VITAMIN D) 2000 UNITS tablet, Take 1 tablet by mouth daily., Disp: , Rfl:    FARXIGA 10 MG TABS tablet, TAKE 1 TABLET BY MOUTH EVERY DAY BEFORE BREAKFAST, Disp: 90 tablet, Rfl: 0   fluticasone (FLONASE) 50 MCG/ACT nasal spray, Place 2 sprays into both nostrils daily., Disp: 16 g, Rfl: 5   Fluticasone-Umeclidin-Vilant (TRELEGY ELLIPTA) 100-62.5-25 MCG/ACT AEPB, Inhale 1 puff into the lungs daily., Disp: 1 each, Rfl: 3   gabapentin (NEURONTIN) 100 MG capsule, Take 1-2 capsules (100-200 mg total) by mouth 3 (three) times daily. 100 mg morning and afternoon and two at night, Disp: 120 capsule, Rfl: 0   ipratropium-albuterol (DUONEB) 0.5-2.5 (3) MG/3ML SOLN, Take 3 mLs by nebulization every 4 (four) hours as needed., Disp: 120 mL, Rfl: 0   meloxicam (MOBIC) 15 MG tablet, Take 1 tablet (15 mg total) by mouth daily., Disp: 90 tablet, Rfl: 0   montelukast (SINGULAIR) 10 MG tablet, TAKE 1 TABLET BY MOUTH EVERYDAY AT BEDTIME, Disp: 90 tablet, Rfl: 1   pioglitazone (ACTOS) 15 MG tablet, TAKE 1 TABLET (15 MG TOTAL) BY MOUTH  DAILY., Disp: 90 tablet, Rfl: 1   pravastatin (PRAVACHOL) 40 MG tablet, Take 1 tablet (40 mg total) by mouth daily., Disp: 90 tablet, Rfl: 0   valsartan-hydrochlorothiazide (DIOVAN-HCT) 160-12.5 MG tablet, TAKE 1 TABLET BY MOUTH EVERY DAY, Disp: 90 tablet, Rfl: 1   Medications ordered in this encounter:  No orders of the defined types were placed in this encounter.    *If you need refills on other medications prior to your next appointment, please contact your pharmacy*  Follow-Up: Call back or seek an in-person evaluation if the symptoms worsen or if the condition fails to improve as anticipated.  [facility] [phone]  Other Instructions Please take antibiotic as directed.  Increase fluid intake.  Use Saline nasal spray.  Take a daily multivitamin.  Continue your daily allergy medications.  Place a humidifier in the bedroom.  Please call or return clinic if symptoms are not improving.  Sinusitis Sinusitis is redness, soreness, and swelling (inflammation) of the paranasal sinuses. Paranasal sinuses are air pockets within the bones of your face (beneath the eyes, the middle of the forehead, or above the eyes). In healthy paranasal sinuses, mucus is able to drain out, and air is able to circulate through them by way of your nose. However, when your paranasal sinuses are inflamed, mucus and air can become trapped. This can allow bacteria and other germs to grow and cause infection. Sinusitis can develop quickly and last  only a short time (acute) or continue over a long period (chronic). Sinusitis that lasts for more than 12 weeks is considered chronic.  CAUSES  Causes of sinusitis include: Allergies. Structural abnormalities, such as displacement of the cartilage that separates your nostrils (deviated septum), which can decrease the air flow through your nose and sinuses and affect sinus drainage. Functional abnormalities, such as when the small hairs (cilia) that line your  sinuses and help remove mucus do not work properly or are not present. SYMPTOMS  Symptoms of acute and chronic sinusitis are the same. The primary symptoms are pain and pressure around the affected sinuses. Other symptoms include: Upper toothache. Earache. Headache. Bad breath. Decreased sense of smell and taste. A cough, which worsens when you are lying flat. Fatigue. Fever. Thick drainage from your nose, which often is green and may contain pus (purulent). Swelling and warmth over the affected sinuses. DIAGNOSIS  Your caregiver will perform a physical exam. During the exam, your caregiver may: Look in your nose for signs of abnormal growths in your nostrils (nasal polyps). Tap over the affected sinus to check for signs of infection. View the inside of your sinuses (endoscopy) with a special imaging device with a light attached (endoscope), which is inserted into your sinuses. If your caregiver suspects that you have chronic sinusitis, one or more of the following tests may be recommended: Allergy tests. Nasal culture A sample of mucus is taken from your nose and sent to a lab and screened for bacteria. Nasal cytology A sample of mucus is taken from your nose and examined by your caregiver to determine if your sinusitis is related to an allergy. TREATMENT  Most cases of acute sinusitis are related to a viral infection and will resolve on their own within 10 days. Sometimes medicines are prescribed to help relieve symptoms (pain medicine, decongestants, nasal steroid sprays, or saline sprays).  However, for sinusitis related to a bacterial infection, your caregiver will prescribe antibiotic medicines. These are medicines that will help kill the bacteria causing the infection.  Rarely, sinusitis is caused by a fungal infection. In theses cases, your caregiver will prescribe antifungal medicine. For some cases of chronic sinusitis, surgery is needed. Generally, these are cases in which  sinusitis recurs more than 3 times per year, despite other treatments. HOME CARE INSTRUCTIONS  Drink plenty of water. Water helps thin the mucus so your sinuses can drain more easily. Use a humidifier. Inhale steam 3 to 4 times a day (for example, sit in the bathroom with the shower running). Apply a warm, moist washcloth to your face 3 to 4 times a day, or as directed by your caregiver. Use saline nasal sprays to help moisten and clean your sinuses. Take over-the-counter or prescription medicines for pain, discomfort, or fever only as directed by your caregiver. SEEK IMMEDIATE MEDICAL CARE IF: You have increasing pain or severe headaches. You have nausea, vomiting, or drowsiness. You have swelling around your face. You have vision problems. You have a stiff neck. You have difficulty breathing. MAKE SURE YOU:  Understand these instructions. Will watch your condition. Will get help right away if you are not doing well or get worse. Document Released: 02/19/2005 Document Revised: 05/14/2011 Document Reviewed: 03/06/2011 [facility] Patient Information 2014 [place], [place].    If you have been instructed to have an in-person evaluation today at a local Urgent Care facility, please use the link below. It will take you to a list of all of our available [facility]  Urgent Cares, including address, phone number and hours of operation. Please do not delay care.  [facility]  If you or a family member do not have a primary care provider, use the link below to schedule a visit and establish care. When you choose a [facility] primary care physician or advanced practice provider, you gain a long-term partner in health. Find a Primary Care Provider  Learn more about [facility] in-office and virtual care options: [facility]

## 2021-12-18 ENCOUNTER — Other Ambulatory Visit: Payer: Self-pay | Admitting: Family Medicine

## 2021-12-18 DIAGNOSIS — E1142 Type 2 diabetes mellitus with diabetic polyneuropathy: Secondary | ICD-10-CM

## 2022-02-05 NOTE — Progress Notes (Deleted)
Name: Kelsey Alvarez   MRN: [id number]    DOB: [birth year]   Date:02/05/2022       Progress Note  Subjective  Chief Complaint  Follow Up  HPI  DM:  hgbA1C was 7.9%, it went up to 8.6 % to 9 % 6.3 % , 6.5 % and today is down to 6.2 %   She was unable to tolerate Metformin or GLP-1 agonist , she is on Farxiga and started on Actos Fall 2022 .She still has neuropathy symptoms ( hands and feet)  she is not taking gabapentin due to sedation at 300 mg dose, we will try lower dose at 100 mg twice a day and 200 mg at night  . She denies polyphagia, polydipsia or polyuria . She has not been compliant with crestor lately    Morbid Obesity:off Ozempic, she stopped because she could not eat anything, she had lost 12 lbs in a short period of time,  we tried switching to Rybelsus but only able to tolerate 3 mg dose, her weight went down 8 lbs but also unable to tolerate medication. Weight is back up again from 266 lbs to 271 lbs and today is 281 lbs. She cannot tolerated GLP-1 agonist   Periumbilical pain: going on for over 8 months  pain is described as aching or cramping like , she states no longer triggered by eating.  No change in bowel movements . No blood in stools. Discussed again importance of seeing GI    Asthma: she has moderate asthma, she is currently using Tre[place] and is doing well, has some SOB with strenous activity. Symptoms worse when outdoors    Hyperlipidemia: she was on Atorvastatin but LDL was not at goal, she is now taking Rosuvastatin but no longer taking it daily due to morning headache. We will try pravastatin and recheck next visit    HTN: taking Valsartan hctz  no side effects. No chest pain oand intermittent palpitation but stable . BP is at goal    Low back pain : used to be intermittent across lower back, but worse over the past 4 months, when standing it radiates to lateral thighs. Last visit we gave her Meloxicam and gabapentin, but 300 mg makes her feel groggy in the mornings.  Discussed PT   Otalgia and tinnitus: both ears but worse on left side, going on for the past week. No rhinorrhea but has been feeling nasal congestion, she has not been using her nasal spray and advised her to take it daily  Patient Active Problem List   Diagnosis Date Noted   Type 2 diabetes, controlled, with peripheral neuropathy ([place]) 01/20/2018   Endometriosis 11/06/2017   Menorrhagia with irregular cycle 10/28/2017   Fibroid uterus 10/28/2017   Carpal tunnel syndrome on both sides 10/17/2017   B12 deficiency 04/27/2016   History of endometriosis 11/17/2015   Oligomenorrhea 10/12/2015   Benign essential HTN 12/28/2014   Cervical polyp 12/28/2014   Controlled type 2 diabetes mellitus with microalbuminuria ([place]) 12/28/2014   Dyslipidemia 12/28/2014   Dysmenorrhea 12/28/2014   Edema 12/28/2014   Female infertility 12/28/2014   Gastro-esophageal reflux disease without esophagitis 12/28/2014   Morbid obesity ([place]) 12/28/2014   Asthma, moderate persistent, poorly-controlled 12/28/2014   Perennial allergic rhinitis 12/28/2014   Intermittent tremor 12/28/2014   Vitamin D deficiency 12/28/2014   Intermittent low back pain 12/28/2014   Left sciatic nerve pain 12/28/2014    Past Surgical History:  Procedure Laterality Date   APPENDECTOMY  cervical polyp removal     DILATION AND CURETTAGE OF UTERUS     HYSTEROSCOPY     HYSTEROSCOPY WITH D & C N/A 10/12/2015   Procedure: DILATATION AND CURETTAGE /HYSTEROSCOPY;  Surgeon: Will Bonnet, MD;  Location: [facility];  Service: Gynecology;  Laterality: N/A;   LAPAROSCOPIC BILATERAL SALPINGO OOPHERECTOMY N/A 10/12/2015   Procedure: LAPAROSCOPIC RIGHT SALPINGECTOMY, LEFT OOPHORECTOMY;  Surgeon: Will Bonnet, MD;  Location: [facility];  Service: Gynecology;  Laterality: N/A;   LAPAROSCOPIC OVARIAN CYSTECTOMY Bilateral 10/12/2015   Procedure: LAPAROSCOPIC OVARIAN CYSTECTOMY;  Surgeon: Will Bonnet, MD;  Location: [facility];  Service:  Gynecology;  Laterality: Bilateral;   OOPHORECTOMY     OVARIAN CYST REMOVAL      Family History  Problem Relation Age of Onset   Hypertension Mother    Diabetes Mother    CAD Mother    Cancer Mother        cervical   Heart attack Mother 27       Triple Bypass   CAD Father    Hypertension Father    Arthritis Father    Thyroid disease Sister    Alzheimer's disease Maternal Grandmother    Alzheimer's disease Maternal Grandfather    Breast cancer Paternal Grandmother     Social History   Tobacco Use   Smoking status: Former    Years: 1.00    Types: Cigarettes    Start date: 12/03/2001    Quit date: 12/28/2002    Years since quitting: 19.1   Smokeless tobacco: Never   Tobacco comments:    smoked 3 to 4 cigarrettes a day for a year  Substance Use Topics   Alcohol use: Yes    Alcohol/week: 0.0 standard drinks of alcohol    Comment: occasionally drinks margarita, once or twice a year     Current Outpatient Medications:    albuterol (VENTOLIN HFA) 108 (90 Base) MCG/ACT inhaler, TAKE 2 PUFFS BY MOUTH EVERY 6 HOURS AS NEEDED FOR WHEEZE OR SHORTNESS OF BREATH, Disp: 8.5 each, Rfl: 0   Alpha Lipoic Acid 200 MG CAPS, Take by mouth., Disp: , Rfl:    baclofen (LIORESAL) 10 MG tablet, Take 1 tablet (10 mg total) by mouth 2 (two) times daily., Disp: 60 each, Rfl: 0   Cetirizine HCl (ZYRTEC PO), Take 10 mg by mouth daily., Disp: , Rfl:    Cholecalciferol (VITAMIN D) 2000 UNITS tablet, Take 1 tablet by mouth daily., Disp: , Rfl:    doxycycline (VIBRA-TABS) 100 MG tablet, Take 1 tablet (100 mg total) by mouth 2 (two) times daily., Disp: 20 tablet, Rfl: 0   FARXIGA 10 MG TABS tablet, TAKE 1 TABLET BY MOUTH EVERY DAY BEFORE BREAKFAST, Disp: 90 tablet, Rfl: 0   fluticasone (FLONASE) 50 MCG/ACT nasal spray, Place 2 sprays into both nostrils daily., Disp: 16 g, Rfl: 5   Fluticasone-Umeclidin-Vilant (TRELEGY ELLIPTA) 100-62.5-25 MCG/ACT AEPB, Inhale 1 puff into the lungs daily., Disp: 1 each,  Rfl: 3   gabapentin (NEURONTIN) 100 MG capsule, Take 1-2 capsules (100-200 mg total) by mouth 3 (three) times daily. 100 mg morning and afternoon and two at night, Disp: 120 capsule, Rfl: 0   ipratropium-albuterol (DUONEB) 0.5-2.5 (3) MG/3ML SOLN, Take 3 mLs by nebulization every 4 (four) hours as needed., Disp: 120 mL, Rfl: 0   meloxicam (MOBIC) 15 MG tablet, Take 1 tablet (15 mg total) by mouth daily., Disp: 90 tablet, Rfl: 0   montelukast (SINGULAIR) 10 MG tablet, TAKE 1 TABLET  BY MOUTH EVERYDAY AT BEDTIME, Disp: 90 tablet, Rfl: 1   pioglitazone (ACTOS) 15 MG tablet, TAKE 1 TABLET (15 MG TOTAL) BY MOUTH DAILY., Disp: 90 tablet, Rfl: 1   pravastatin (PRAVACHOL) 40 MG tablet, Take 1 tablet (40 mg total) by mouth daily., Disp: 90 tablet, Rfl: 0   valsartan-hydrochlorothiazide (DIOVAN-HCT) 160-12.5 MG tablet, TAKE 1 TABLET BY MOUTH EVERY DAY, Disp: 90 tablet, Rfl: 1  Allergies  Allergen Reactions   Fish Allergy Shortness Of Breath   Penicillins Anaphylaxis and Rash    Childhood allergy   Crestor [Rosuvastatin]     Headache    Levofloxacin Other (See Comments)    Cramping   Adhesive [Tape] Rash    Paper tape ok to use.    I personally reviewed active problem list, medication list, allergies, family history, social history, health maintenance with the patient/caregiver today.   ROS  ***  Objective  There were no vitals filed for this visit.  There is no height or weight on file to calculate BMI.  Physical Exam ***  No results found for this or any previous visit (from the past 2160 hour(s)).   PHQ2/9:    11/07/2021    8:20 AM 06/19/2021   11:33 AM 03/14/2021   10:06 AM 10/03/2020    9:32 AM 06/03/2020   10:26 AM  Depression screen PHQ 2/9  Decreased Interest 0 0 0 0 0  Down, Depressed, Hopeless 0 0 0 0 0  PHQ - 2 Score 0 0 0 0 0  Altered sleeping 3 0 0    Tired, decreased energy 0 0 0    Change in appetite 0 0 0    Feeling bad or failure about yourself  0 0 0    Trouble  concentrating 0 0 0    Moving slowly or fidgety/restless 0 0 0    Suicidal thoughts 0 0 0    PHQ-9 Score 3 0 0      phq 9 is {gen pos [id number]}   Fall Risk:    11/07/2021    8:20 AM 06/19/2021   11:33 AM 03/14/2021   10:06 AM 10/03/2020    9:32 AM 06/03/2020   10:26 AM  Fall Risk   Falls in the past year? 0 0 0 0 0  Number falls in past yr: 0 0  0 0  Injury with Fall? 0 0  0 0  Risk for fall due to : No Fall Risks No Fall Risks     Follow up Falls prevention discussed Falls prevention discussed Falls prevention discussed        Functional Status Survey:      Assessment & Plan  *** There are no diagnoses linked to this encounter.

## 2022-02-06 ENCOUNTER — Ambulatory Visit: Payer: 59 | Admitting: Family Medicine

## 2022-02-14 NOTE — Progress Notes (Deleted)
Name: Kelsey Alvarez   MRN: [id number]    DOB: 04/18/1970   Date:02/14/2022       Progress Note  Subjective  Chief Complaint  Follow Up  HPI  DM:  hgbA1C was 7.9%, it went up to 8.6 % to 9 % 6.3 % , 6.5 % and today is down to 6.2 %   She was unable to tolerate Metformin or GLP-1 agonist , she is on Farxiga and started on Actos Fall 2022 .She still has neuropathy symptoms ( hands and feet)  she is not taking gabapentin due to sedation at 300 mg dose, we will try lower dose at 100 mg twice a day and 200 mg at night  . She denies polyphagia, polydipsia or polyuria . She has not been compliant with crestor lately    Morbid Obesity:off Ozempic, she stopped because she could not eat anything, she had lost 12 lbs in a short period of time,  we tried switching to Rybelsus but only able to tolerate 3 mg dose, her weight went down 8 lbs but also unable to tolerate medication. Weight is back up again from 266 lbs to 271 lbs and today is 281 lbs. She cannot tolerated GLP-1 agonist   Periumbilical pain: going on for over 8 months  pain is described as aching or cramping like , she states no longer triggered by eating.  No change in bowel movements . No blood in stools. Discussed again importance of seeing GI    Asthma: she has moderate asthma, she is currently using Treleby and is doing well, has some SOB with strenous activity. Symptoms worse when outdoors    Hyperlipidemia: she was on Atorvastatin but LDL was not at goal, she is now taking Rosuvastatin but no longer taking it daily due to morning headache. We will try pravastatin and recheck next visit    HTN: taking Valsartan hctz  no side effects. No chest pain oand intermittent palpitation but stable . BP is at goal    Low back pain : used to be intermittent across lower back, but worse over the past 4 months, when standing it radiates to lateral thighs. Last visit we gave her Meloxicam and gabapentin, but 300 mg makes her feel groggy in the mornings.  Discussed PT   Otalgia and tinnitus: both ears but worse on left side, going on for the past week. No rhinorrhea but has been feeling nasal congestion, she has not been using her nasal spray and advised her to take it daily  Patient Active Problem List   Diagnosis Date Noted   Type 2 diabetes, controlled, with peripheral neuropathy ([place]) 01/20/2018   Endometriosis 11/06/2017   Menorrhagia with irregular cycle 10/28/2017   Fibroid uterus 10/28/2017   Carpal tunnel syndrome on both sides 10/17/2017   B12 deficiency 04/27/2016   History of endometriosis 11/17/2015   Oligomenorrhea 10/12/2015   Benign essential HTN 12/28/2014   Cervical polyp 12/28/2014   Controlled type 2 diabetes mellitus with microalbuminuria ([place]) 12/28/2014   Dyslipidemia 12/28/2014   Dysmenorrhea 12/28/2014   Edema 12/28/2014   Female infertility 12/28/2014   Gastro-esophageal reflux disease without esophagitis 12/28/2014   Morbid obesity ([place]) 12/28/2014   Asthma, moderate persistent, poorly-controlled 12/28/2014   Perennial allergic rhinitis 12/28/2014   Intermittent tremor 12/28/2014   Vitamin D deficiency 12/28/2014   Intermittent low back pain 12/28/2014   Left sciatic nerve pain 12/28/2014    Past Surgical History:  Procedure Laterality Date   APPENDECTOMY  cervical polyp removal     DILATION AND CURETTAGE OF UTERUS     HYSTEROSCOPY     HYSTEROSCOPY WITH D & C N/A 10/12/2015   Procedure: DILATATION AND CURETTAGE /HYSTEROSCOPY;  Surgeon: Will Bonnet, MD;  Location: [facility];  Service: Gynecology;  Laterality: N/A;   LAPAROSCOPIC BILATERAL SALPINGO OOPHERECTOMY N/A 10/12/2015   Procedure: LAPAROSCOPIC RIGHT SALPINGECTOMY, LEFT OOPHORECTOMY;  Surgeon: Will Bonnet, MD;  Location: [facility];  Service: Gynecology;  Laterality: N/A;   LAPAROSCOPIC OVARIAN CYSTECTOMY Bilateral 10/12/2015   Procedure: LAPAROSCOPIC OVARIAN CYSTECTOMY;  Surgeon: Will Bonnet, MD;  Location: [facility];  Service:  Gynecology;  Laterality: Bilateral;   OOPHORECTOMY     OVARIAN CYST REMOVAL      Family History  Problem Relation Age of Onset   Hypertension Mother    Diabetes Mother    CAD Mother    Cancer Mother        cervical   Heart attack Mother 55       Triple Bypass   CAD Father    Hypertension Father    Arthritis Father    Thyroid disease Sister    Alzheimer's disease Maternal Grandmother    Alzheimer's disease Maternal Grandfather    Breast cancer Paternal Grandmother     Social History   Tobacco Use   Smoking status: Former    Years: 1.00    Types: Cigarettes    Start date: 12/03/2001    Quit date: 12/28/2002    Years since quitting: 19.1   Smokeless tobacco: Never   Tobacco comments:    smoked 3 to 4 cigarrettes a day for a year  Substance Use Topics   Alcohol use: Yes    Alcohol/week: 0.0 standard drinks of alcohol    Comment: occasionally drinks margarita, once or twice a year     Current Outpatient Medications:    albuterol (VENTOLIN HFA) 108 (90 Base) MCG/ACT inhaler, TAKE 2 PUFFS BY MOUTH EVERY 6 HOURS AS NEEDED FOR WHEEZE OR SHORTNESS OF BREATH, Disp: 8.5 each, Rfl: 0   Alpha Lipoic Acid 200 MG CAPS, Take by mouth., Disp: , Rfl:    baclofen (LIORESAL) 10 MG tablet, Take 1 tablet (10 mg total) by mouth 2 (two) times daily., Disp: 60 each, Rfl: 0   Cetirizine HCl (ZYRTEC PO), Take 10 mg by mouth daily., Disp: , Rfl:    Cholecalciferol (VITAMIN D) 2000 UNITS tablet, Take 1 tablet by mouth daily., Disp: , Rfl:    doxycycline (VIBRA-TABS) 100 MG tablet, Take 1 tablet (100 mg total) by mouth 2 (two) times daily., Disp: 20 tablet, Rfl: 0   FARXIGA 10 MG TABS tablet, TAKE 1 TABLET BY MOUTH EVERY DAY BEFORE BREAKFAST, Disp: 90 tablet, Rfl: 0   fluticasone (FLONASE) 50 MCG/ACT nasal spray, Place 2 sprays into both nostrils daily., Disp: 16 g, Rfl: 5   Fluticasone-Umeclidin-Vilant (TRELEGY ELLIPTA) 100-62.5-25 MCG/ACT AEPB, Inhale 1 puff into the lungs daily., Disp: 1 each,  Rfl: 3   gabapentin (NEURONTIN) 100 MG capsule, Take 1-2 capsules (100-200 mg total) by mouth 3 (three) times daily. 100 mg morning and afternoon and two at night, Disp: 120 capsule, Rfl: 0   ipratropium-albuterol (DUONEB) 0.5-2.5 (3) MG/3ML SOLN, Take 3 mLs by nebulization every 4 (four) hours as needed., Disp: 120 mL, Rfl: 0   meloxicam (MOBIC) 15 MG tablet, Take 1 tablet (15 mg total) by mouth daily., Disp: 90 tablet, Rfl: 0   montelukast (SINGULAIR) 10 MG tablet, TAKE 1 TABLET  BY MOUTH EVERYDAY AT BEDTIME, Disp: 90 tablet, Rfl: 1   pioglitazone (ACTOS) 15 MG tablet, TAKE 1 TABLET (15 MG TOTAL) BY MOUTH DAILY., Disp: 90 tablet, Rfl: 1   pravastatin (PRAVACHOL) 40 MG tablet, Take 1 tablet (40 mg total) by mouth daily., Disp: 90 tablet, Rfl: 0   valsartan-hydrochlorothiazide (DIOVAN-HCT) 160-12.5 MG tablet, TAKE 1 TABLET BY MOUTH EVERY DAY, Disp: 90 tablet, Rfl: 1  Allergies  Allergen Reactions   Fish Allergy Shortness Of Breath   Penicillins Anaphylaxis and Rash    Childhood allergy   Crestor [Rosuvastatin]     Headache    Levofloxacin Other (See Comments)    Cramping   Adhesive [Tape] Rash    Paper tape ok to use.    I personally reviewed active problem list, medication list, allergies, family history, social history, health maintenance with the patient/caregiver today.   ROS  ***  Objective  There were no vitals filed for this visit.  There is no height or weight on file to calculate BMI.  Physical Exam ***  No results found for this or any previous visit (from the past 2160 hour(s)).   PHQ2/9:    11/07/2021    8:20 AM 06/19/2021   11:33 AM 03/14/2021   10:06 AM 10/03/2020    9:32 AM 06/03/2020   10:26 AM  Depression screen PHQ 2/9  Decreased Interest 0 0 0 0 0  Down, Depressed, Hopeless 0 0 0 0 0  PHQ - 2 Score 0 0 0 0 0  Altered sleeping 3 0 0    Tired, decreased energy 0 0 0    Change in appetite 0 0 0    Feeling bad or failure about yourself  0 0 0    Trouble  concentrating 0 0 0    Moving slowly or fidgety/restless 0 0 0    Suicidal thoughts 0 0 0    PHQ-9 Score 3 0 0      phq 9 is {gen pos [id number]}   Fall Risk:    11/07/2021    8:20 AM 06/19/2021   11:33 AM 03/14/2021   10:06 AM 10/03/2020    9:32 AM 06/03/2020   10:26 AM  Fall Risk   Falls in the past year? 0 0 0 0 0  Number falls in past yr: 0 0  0 0  Injury with Fall? 0 0  0 0  Risk for fall due to : No Fall Risks No Fall Risks     Follow up Falls prevention discussed Falls prevention discussed Falls prevention discussed        Functional Status Survey:      Assessment & Plan  *** There are no diagnoses linked to this encounter.

## 2022-02-15 ENCOUNTER — Ambulatory Visit: Payer: 59 | Admitting: Family Medicine

## 2022-03-01 NOTE — Progress Notes (Deleted)
Name: Kelsey Alvarez   MRN: [id number]    DOB: 1970-04-30   Date:03/01/2022       Progress Note  Subjective  Chief Complaint  Follow Up  HPI  DM:  hgbA1C was 7.9%, it went up to 8.6 % to 9 % 6.3 % , 6.5 % and today is down to 6.2 %   She was unable to tolerate Metformin or GLP-1 agonist , she is on Farxiga and started on Actos Fall 2022 .She still has neuropathy symptoms ( hands and feet)  she is not taking gabapentin due to sedation at 300 mg dose, we will try lower dose at 100 mg twice a day and 200 mg at night  . She denies polyphagia, polydipsia or polyuria . She has not been compliant with crestor lately    Morbid Obesity:off Ozempic, she stopped because she could not eat anything, she had lost 12 lbs in a short period of time,  we tried switching to Rybelsus but only able to tolerate 3 mg dose, her weight went down 8 lbs but also unable to tolerate medication. Weight is back up again from 266 lbs to 271 lbs and today is 281 lbs. She cannot tolerated GLP-1 agonist   Periumbilical pain: going on for over 8 months  pain is described as aching or cramping like , she states no longer triggered by eating.  No change in bowel movements . No blood in stools. Discussed again importance of seeing GI    Asthma: she has moderate asthma, she is currently using Treleby and is doing well, has some SOB with strenous activity. Symptoms worse when outdoors    Hyperlipidemia: she was on Atorvastatin but LDL was not at goal, she is now taking Rosuvastatin but no longer taking it daily due to morning headache. We will try pravastatin and recheck next visit    HTN: taking Valsartan hctz  no side effects. No chest pain oand intermittent palpitation but stable . BP is at goal    Low back pain : used to be intermittent across lower back, but worse over the past 4 months, when standing it radiates to lateral thighs. Last visit we gave her Meloxicam and gabapentin, but 300 mg makes her feel groggy in the mornings.  Discussed PT   Otalgia and tinnitus: both ears but worse on left side, going on for the past week. No rhinorrhea but has been feeling nasal congestion, she has not been using her nasal spray and advised her to take it daily  Patient Active Problem List   Diagnosis Date Noted   Type 2 diabetes, controlled, with peripheral neuropathy ([place]) 01/20/2018   Endometriosis 11/06/2017   Menorrhagia with irregular cycle 10/28/2017   Fibroid uterus 10/28/2017   Carpal tunnel syndrome on both sides 10/17/2017   B12 deficiency 04/27/2016   History of endometriosis 11/17/2015   Oligomenorrhea 10/12/2015   Benign essential HTN 12/28/2014   Cervical polyp 12/28/2014   Controlled type 2 diabetes mellitus with microalbuminuria ([place]) 12/28/2014   Dyslipidemia 12/28/2014   Dysmenorrhea 12/28/2014   Edema 12/28/2014   Female infertility 12/28/2014   Gastro-esophageal reflux disease without esophagitis 12/28/2014   Morbid obesity ([place]) 12/28/2014   Asthma, moderate persistent, poorly-controlled 12/28/2014   Perennial allergic rhinitis 12/28/2014   Intermittent tremor 12/28/2014   Vitamin D deficiency 12/28/2014   Intermittent low back pain 12/28/2014   Left sciatic nerve pain 12/28/2014    Past Surgical History:  Procedure Laterality Date   APPENDECTOMY  cervical polyp removal     DILATION AND CURETTAGE OF UTERUS     HYSTEROSCOPY     HYSTEROSCOPY WITH D & C N/A 10/12/2015   Procedure: DILATATION AND CURETTAGE /HYSTEROSCOPY;  Surgeon: Will Bonnet, MD;  Location: [facility];  Service: Gynecology;  Laterality: N/A;   LAPAROSCOPIC BILATERAL SALPINGO OOPHERECTOMY N/A 10/12/2015   Procedure: LAPAROSCOPIC RIGHT SALPINGECTOMY, LEFT OOPHORECTOMY;  Surgeon: Will Bonnet, MD;  Location: [facility];  Service: Gynecology;  Laterality: N/A;   LAPAROSCOPIC OVARIAN CYSTECTOMY Bilateral 10/12/2015   Procedure: LAPAROSCOPIC OVARIAN CYSTECTOMY;  Surgeon: Will Bonnet, MD;  Location: [facility];  Service:  Gynecology;  Laterality: Bilateral;   OOPHORECTOMY     OVARIAN CYST REMOVAL      Family History  Problem Relation Age of Onset   Hypertension Mother    Diabetes Mother    CAD Mother    Cancer Mother        cervical   Heart attack Mother 45       Triple Bypass   CAD Father    Hypertension Father    Arthritis Father    Thyroid disease Sister    Alzheimer's disease Maternal Grandmother    Alzheimer's disease Maternal Grandfather    Breast cancer Paternal Grandmother     Social History   Tobacco Use   Smoking status: Former    Years: 1.00    Types: Cigarettes    Start date: 12/03/2001    Quit date: 12/28/2002    Years since quitting: 19.1   Smokeless tobacco: Never   Tobacco comments:    smoked 3 to 4 cigarrettes a day for a year  Substance Use Topics   Alcohol use: Yes    Alcohol/week: 0.0 standard drinks of alcohol    Comment: occasionally drinks margarita, once or twice a year     Current Outpatient Medications:    albuterol (VENTOLIN HFA) 108 (90 Base) MCG/ACT inhaler, TAKE 2 PUFFS BY MOUTH EVERY 6 HOURS AS NEEDED FOR WHEEZE OR SHORTNESS OF BREATH, Disp: 8.5 each, Rfl: 0   Alpha Lipoic Acid 200 MG CAPS, Take by mouth., Disp: , Rfl:    baclofen (LIORESAL) 10 MG tablet, Take 1 tablet (10 mg total) by mouth 2 (two) times daily., Disp: 60 each, Rfl: 0   Cetirizine HCl (ZYRTEC PO), Take 10 mg by mouth daily., Disp: , Rfl:    Cholecalciferol (VITAMIN D) 2000 UNITS tablet, Take 1 tablet by mouth daily., Disp: , Rfl:    doxycycline (VIBRA-TABS) 100 MG tablet, Take 1 tablet (100 mg total) by mouth 2 (two) times daily., Disp: 20 tablet, Rfl: 0   FARXIGA 10 MG TABS tablet, TAKE 1 TABLET BY MOUTH EVERY DAY BEFORE BREAKFAST, Disp: 90 tablet, Rfl: 0   fluticasone (FLONASE) 50 MCG/ACT nasal spray, Place 2 sprays into both nostrils daily., Disp: 16 g, Rfl: 5   Fluticasone-Umeclidin-Vilant (TRELEGY ELLIPTA) 100-62.5-25 MCG/ACT AEPB, Inhale 1 puff into the lungs daily., Disp: 1 each,  Rfl: 3   gabapentin (NEURONTIN) 100 MG capsule, Take 1-2 capsules (100-200 mg total) by mouth 3 (three) times daily. 100 mg morning and afternoon and two at night, Disp: 120 capsule, Rfl: 0   ipratropium-albuterol (DUONEB) 0.5-2.5 (3) MG/3ML SOLN, Take 3 mLs by nebulization every 4 (four) hours as needed., Disp: 120 mL, Rfl: 0   meloxicam (MOBIC) 15 MG tablet, Take 1 tablet (15 mg total) by mouth daily., Disp: 90 tablet, Rfl: 0   montelukast (SINGULAIR) 10 MG tablet, TAKE 1 TABLET  BY MOUTH EVERYDAY AT BEDTIME, Disp: 90 tablet, Rfl: 1   pioglitazone (ACTOS) 15 MG tablet, TAKE 1 TABLET (15 MG TOTAL) BY MOUTH DAILY., Disp: 90 tablet, Rfl: 1   pravastatin (PRAVACHOL) 40 MG tablet, Take 1 tablet (40 mg total) by mouth daily., Disp: 90 tablet, Rfl: 0   valsartan-hydrochlorothiazide (DIOVAN-HCT) 160-12.5 MG tablet, TAKE 1 TABLET BY MOUTH EVERY DAY, Disp: 90 tablet, Rfl: 1  Allergies  Allergen Reactions   Fish Allergy Shortness Of Breath   Penicillins Anaphylaxis and Rash    Childhood allergy   Crestor [Rosuvastatin]     Headache    Levofloxacin Other (See Comments)    Cramping   Adhesive [Tape] Rash    Paper tape ok to use.    I personally reviewed active problem list, medication list, allergies, family history, social history, health maintenance with the patient/caregiver today.   ROS  ***  Objective  There were no vitals filed for this visit.  There is no height or weight on file to calculate BMI.  Physical Exam ***  No results found for this or any previous visit (from the past 2160 hour(s)).   PHQ2/9:    11/07/2021    8:20 AM 06/19/2021   11:33 AM 03/14/2021   10:06 AM 10/03/2020    9:32 AM 06/03/2020   10:26 AM  Depression screen PHQ 2/9  Decreased Interest 0 0 0 0 0  Down, Depressed, Hopeless 0 0 0 0 0  PHQ - 2 Score 0 0 0 0 0  Altered sleeping 3 0 0    Tired, decreased energy 0 0 0    Change in appetite 0 0 0    Feeling bad or failure about yourself  0 0 0    Trouble  concentrating 0 0 0    Moving slowly or fidgety/restless 0 0 0    Suicidal thoughts 0 0 0    PHQ-9 Score 3 0 0      phq 9 is {gen pos [id number]}   Fall Risk:    11/07/2021    8:20 AM 06/19/2021   11:33 AM 03/14/2021   10:06 AM 10/03/2020    9:32 AM 06/03/2020   10:26 AM  Fall Risk   Falls in the past year? 0 0 0 0 0  Number falls in past yr: 0 0  0 0  Injury with Fall? 0 0  0 0  Risk for fall due to : No Fall Risks No Fall Risks     Follow up Falls prevention discussed Falls prevention discussed Falls prevention discussed        Functional Status Survey:      Assessment & Plan  *** There are no diagnoses linked to this encounter.

## 2022-03-02 ENCOUNTER — Ambulatory Visit: Payer: 59 | Admitting: Family Medicine

## 2022-03-02 DIAGNOSIS — E1142 Type 2 diabetes mellitus with diabetic polyneuropathy: Secondary | ICD-10-CM

## 2022-03-08 NOTE — Progress Notes (Signed)
Name: Kelsey Alvarez   MRN: [id number]    DOB: 28-Jan-1971   Date:03/09/2022       Progress Note  Subjective  Chief Complaint  Follow Up  HPI  DM:  hgbA1C was 7.9%, it went up to 8.6 % to 9 % 6.3 % , 6.5 % , 6.2 % and today is 6.8 %   She was unable to tolerate Metformin or GLP-1 agonist , she is on Farxiga and started on Actos Fall 2022 .She still has neuropathy symptoms ( hands and feet)  she states no longer taking gabapentin daily since pain on feet not bad lately since she started to take otc supplementation - alpha lipoic acid recommended by neurologist. She denies polyphagia but has noticed increase in thirsty lately and also not as compliant with her diet over the holidays. She states pravastatin causes headaches and is only taking it a few times a week so we will add Zetia.  Morbid Obesity:off Ozempic, she stopped because she could not eat anything, she had lost 12 lbs in a short period of time,  we tried switching to Rybelsus but only able to tolerate 3 mg dose, her weight went down 8 lbs but also unable to tolerate medication. Weight is back up again from 266 lbs to 281 lbs but I starting to trend down again , today was 458 lbs   Periumbilical pain: going on for over one year, intermittent, she had laparoscopic surgery before, episodes usually when wearing tight clothes , no redness or increase in warmth    Asthma: she has moderate asthma, she is currently using Treleby but not daily because it causes thrush, she would like to switch back to Symbicort , has some SOB with strenous activity. Symptoms worse when outdoors    Hyperlipidemia: she was on Atorvastatin but LDL was not at goal, we switched to Crestor but she could not tolerate it , she is now on pravastatin but skips dose because it causes a headache, we will add zetia  HTN: taking Valsartan hctz  no side effects. No chest pain oand intermittent palpitation but stable . Continue medicaiton    Low back pain : used to be  intermittent across lower back, but worse since last Summer  when standing it radiates to lateral thighs.We gave her gabapentin and meloxicam, she was seen by Ortho and was told she has arthritis  but no other therapy was given    Patient Active Problem List   Diagnosis Date Noted   Localized osteoarthritis of left knee 03/09/2022   Lumbar back pain with radiculopathy affecting left lower extremity 03/09/2022   Dyslipidemia associated with type 2 diabetes mellitus ([place]) 01/20/2018   Endometriosis 11/06/2017   Menorrhagia with irregular cycle 10/28/2017   Fibroid uterus 10/28/2017   Carpal tunnel syndrome on both sides 10/17/2017   B12 deficiency 04/27/2016   History of endometriosis 11/17/2015   Oligomenorrhea 10/12/2015   Benign essential HTN 12/28/2014   Cervical polyp 12/28/2014   Controlled type 2 diabetes mellitus with microalbuminuria ([place]) 12/28/2014   Dyslipidemia 12/28/2014   Dysmenorrhea 12/28/2014   Edema 12/28/2014   Female infertility 12/28/2014   Gastro-esophageal reflux disease without esophagitis 12/28/2014   Morbid obesity ([place]) 12/28/2014   Asthma, moderate persistent, poorly-controlled 12/28/2014   Perennial allergic rhinitis 12/28/2014   Intermittent tremor 12/28/2014   Vitamin D deficiency 12/28/2014   Intermittent low back pain 12/28/2014   Left sciatic nerve pain 12/28/2014    Past Surgical History:  Procedure Laterality Date  APPENDECTOMY     cervical polyp removal     DILATION AND CURETTAGE OF UTERUS     HYSTEROSCOPY     HYSTEROSCOPY WITH D & C N/A 10/12/2015   Procedure: DILATATION AND CURETTAGE /HYSTEROSCOPY;  Surgeon: Will Bonnet, MD;  Location: [facility];  Service: Gynecology;  Laterality: N/A;   LAPAROSCOPIC BILATERAL SALPINGO OOPHERECTOMY N/A 10/12/2015   Procedure: LAPAROSCOPIC RIGHT SALPINGECTOMY, LEFT OOPHORECTOMY;  Surgeon: Will Bonnet, MD;  Location: [facility];  Service: Gynecology;  Laterality: N/A;   LAPAROSCOPIC OVARIAN  CYSTECTOMY Bilateral 10/12/2015   Procedure: LAPAROSCOPIC OVARIAN CYSTECTOMY;  Surgeon: Will Bonnet, MD;  Location: [facility];  Service: Gynecology;  Laterality: Bilateral;   OOPHORECTOMY     OVARIAN CYST REMOVAL      Family History  Problem Relation Age of Onset   Hypertension Mother    Diabetes Mother    CAD Mother    Cancer Mother        cervical   Heart attack Mother 62       Triple Bypass   CAD Father    Hypertension Father    Arthritis Father    Thyroid disease Sister    Alzheimer's disease Maternal Grandmother    Alzheimer's disease Maternal Grandfather    Breast cancer Paternal Grandmother     Social History   Tobacco Use   Smoking status: Former    Years: 1.00    Types: Cigarettes    Start date: 12/03/2001    Quit date: 12/28/2002    Years since quitting: 19.2   Smokeless tobacco: Never   Tobacco comments:    smoked 3 to 4 cigarrettes a day for a year  Substance Use Topics   Alcohol use: Yes    Alcohol/week: 0.0 standard drinks of alcohol    Comment: occasionally drinks margarita, once or twice a year     Current Outpatient Medications:    albuterol (VENTOLIN HFA) 108 (90 Base) MCG/ACT inhaler, TAKE 2 PUFFS BY MOUTH EVERY 6 HOURS AS NEEDED FOR WHEEZE OR SHORTNESS OF BREATH, Disp: 8.5 each, Rfl: 0   Alpha Lipoic Acid 200 MG CAPS, Take by mouth., Disp: , Rfl:    Cetirizine HCl (ZYRTEC PO), Take 10 mg by mouth daily., Disp: , Rfl:    Cholecalciferol (VITAMIN D) 2000 UNITS tablet, Take 1 tablet by mouth daily., Disp: , Rfl:    ezetimibe (ZETIA) 10 MG tablet, Take 1 tablet (10 mg total) by mouth daily., Disp: 90 tablet, Rfl: 1   fluticasone (FLONASE) 50 MCG/ACT nasal spray, Place 2 sprays into both nostrils daily., Disp: 16 g, Rfl: 5   gabapentin (NEURONTIN) 100 MG capsule, Take 1-2 capsules (100-200 mg total) by mouth 3 (three) times daily. 100 mg morning and afternoon and two at night, Disp: 120 capsule, Rfl: 0   ipratropium-albuterol (DUONEB) 0.5-2.5 (3)  MG/3ML SOLN, Take 3 mLs by nebulization every 4 (four) hours as needed., Disp: 120 mL, Rfl: 0   dapagliflozin propanediol (FARXIGA) 10 MG TABS tablet, TAKE 1 TABLET BY MOUTH EVERY DAY BEFORE BREAKFAST, Disp: 90 tablet, Rfl: 1   Fluticasone-Umeclidin-Vilant (TRELEGY ELLIPTA) 100-62.5-25 MCG/ACT AEPB, Inhale 1 puff into the lungs daily., Disp: 1 each, Rfl: 3   meloxicam (MOBIC) 7.5 MG tablet, Take 1 tablet (7.5 mg total) by mouth 2 (two) times daily., Disp: 180 tablet, Rfl: 1   montelukast (SINGULAIR) 10 MG tablet, TAKE 1 TABLET BY MOUTH EVERYDAY AT BEDTIME, Disp: 90 tablet, Rfl: 1   pioglitazone (ACTOS) 15 MG tablet,  Take 1 tablet (15 mg total) by mouth daily., Disp: 90 tablet, Rfl: 1   pravastatin (PRAVACHOL) 40 MG tablet, Take 1 tablet (40 mg total) by mouth 3 (three) times a week., Disp: 12 tablet, Rfl: 1   valsartan-hydrochlorothiazide (DIOVAN-HCT) 160-12.5 MG tablet, Take 1 tablet by mouth daily., Disp: 90 tablet, Rfl: 1  Allergies  Allergen Reactions   Fish Allergy Shortness Of Breath   Penicillins Anaphylaxis and Rash    Childhood allergy   Crestor [Rosuvastatin]     Headache    Levofloxacin Other (See Comments)    Cramping   Adhesive [Tape] Rash    Paper tape ok to use.    I personally reviewed active problem list, medication list, allergies, family history, social history, health maintenance with the patient/caregiver today.   ROS  Constitutional: Negative for fever or significant  weight change.  Respiratory: Negative for cough and shortness of breath.   Cardiovascular: Negative for chest pain or palpitations.  Gastrointestinal: Negative for abdominal pain, no bowel changes.  Musculoskeletal: positive for gait problem and intermittent left  joint swelling.  Skin: Negative for rash.  Neurological: Negative for dizziness or headache.  No other specific complaints in a complete review of systems (except as listed in HPI above).   Objective  Vitals:   03/09/22 1428  BP:  124/78  Pulse: 94  Resp: 16  SpO2: 98%  Weight: 278 lb (126.1 kg)  Height: '5\' 1"'$  (1.549 m)    Body mass index is 52.53 kg/m.  Physical Exam  Constitutional: Patient appears well-developed and well-nourished. Obese  No distress.  HEENT: head atraumatic, normocephalic, pupils equal and reactive to light, neck supple Cardiovascular: Normal rate, regular rhythm and normal heart sounds.  No murmur heard. No BLE edema. Pulmonary/Chest: Effort normal and breath sounds normal. No respiratory distress. Abdominal: Soft.  There is no tenderness. Psychiatric: Patient has a normal mood and affect. behavior is normal. Judgment and thought content normal.   Recent Results (from the past 2160 hour(s))  POCT HgB A1C     Status: Abnormal   Collection Time: 03/09/22  2:40 PM  Result Value Ref Range   Hemoglobin A1C 6.8 (A) 4.0 - 5.6 %   HbA1c POC (<> result, manual entry)     HbA1c, POC (prediabetic range)     HbA1c, POC (controlled diabetic range)      Diabetic Foot Exam: Diabetic Foot Exam - Simple   Simple Foot Form Visual Inspection See comments: Yes Sensation Testing See comments: Yes Pulse Check Posterior Tibialis and Dorsalis pulse intact bilaterally: Yes Comments Failed monofilament test on ball of foot Plantar wart on plantar lateral aspect of left foot, small and not very tender       PHQ2/9:    03/09/2022    2:39 PM 11/07/2021    8:20 AM 06/19/2021   11:33 AM 03/14/2021   10:06 AM 10/03/2020    9:32 AM  Depression screen PHQ 2/9  Decreased Interest 0 0 0 0 0  Down, Depressed, Hopeless 0 0 0 0 0  PHQ - 2 Score 0 0 0 0 0  Altered sleeping 3 3 0 0   Tired, decreased energy 3 0 0 0   Change in appetite 0 0 0 0   Feeling bad or failure about yourself  0 0 0 0   Trouble concentrating 0 0 0 0   Moving slowly or fidgety/restless 0 0 0 0   Suicidal thoughts 0 0 0 0   PHQ-9 Score  6 3 0 0     phq 9 is negative    Fall Risk:    03/09/2022    2:39 PM 11/07/2021    8:20 AM  06/19/2021   11:33 AM 03/14/2021   10:06 AM 10/03/2020    9:32 AM  Fall Risk   Falls in the past year? 0 0 0 0 0  Number falls in past yr: 0 0 0  0  Injury with Fall? 0 0 0  0  Risk for fall due to : No Fall Risks No Fall Risks No Fall Risks    Follow up Falls prevention discussed Falls prevention discussed Falls prevention discussed Falls prevention discussed       Functional Status Survey: Is the patient deaf or have difficulty hearing?: No Does the patient have difficulty seeing, even when wearing glasses/contacts?: No Does the patient have difficulty concentrating, remembering, or making decisions?: No Does the patient have difficulty walking or climbing stairs?: Yes Does the patient have difficulty dressing or bathing?: No Does the patient have difficulty doing errands alone such as visiting a doctor's office or shopping?: No    Assessment & Plan  1. Type 2 diabetes, controlled, with peripheral neuropathy (HCC)  - POCT HgB A1C - Urine Microalbumin w/creat. ratio - HM Diabetes Foot Exam - Lipid panel - COMPLETE METABOLIC PANEL WITH GFR - dapagliflozin propanediol (FARXIGA) 10 MG TABS tablet; TAKE 1 TABLET BY MOUTH EVERY DAY BEFORE BREAKFAST  Dispense: 90 tablet; Refill: 1  2. Dyslipidemia associated with type 2 diabetes mellitus (HCC)  - pioglitazone (ACTOS) 15 MG tablet; Take 1 tablet (15 mg total) by mouth daily.  Dispense: 90 tablet; Refill: 1 - pravastatin (PRAVACHOL) 40 MG tablet; Take 1 tablet (40 mg total) by mouth 3 (three) times a week.  Dispense: 12 tablet; Refill: 1 - ezetimibe (ZETIA) 10 MG tablet; Take 1 tablet (10 mg total) by mouth daily.  Dispense: 90 tablet; Refill: 1  3. Morbid obesity ([place])  Discussed with the patient the risk posed by an increased BMI. Discussed importance of portion control, calorie counting and at least 150 minutes of physical activity weekly. Avoid sweet beverages and drink more water. Eat at least 6 servings of fruit and vegetables  daily    4. Vitamin D deficiency  Continue supplementation   5. B12 deficiency  Continue supplementation   6. Asthma, moderate persistent, well-controlled  - montelukast (SINGULAIR) 10 MG tablet; TAKE 1 TABLET BY MOUTH EVERYDAY AT BEDTIME  Dispense: 90 tablet; Refill: 1 - Fluticasone-Umeclidin-Vilant (TRELEGY ELLIPTA) 100-62.5-25 MCG/ACT AEPB; Inhale 1 puff into the lungs daily.  Dispense: 1 each; Refill: 3  7. Gastro-esophageal reflux disease without esophagitis  Taking prn tums only   8. Benign essential HTN  - valsartan-hydrochlorothiazide (DIOVAN-HCT) 160-12.5 MG tablet; Take 1 tablet by mouth daily.  Dispense: 90 tablet; Refill: 1  9. Lumbar back pain with radiculopathy affecting left lower extremity  - meloxicam (MOBIC) 7.5 MG tablet; Take 1 tablet (7.5 mg total) by mouth 2 (two) times daily.  Dispense: 180 tablet; Refill: 1  10. Localized osteoarthritis of left knee  - meloxicam (MOBIC) 7.5 MG tablet; Take 1 tablet (7.5 mg total) by mouth 2 (two) times daily.  Dispense: 180 tablet; Refill: 1

## 2022-03-09 ENCOUNTER — Ambulatory Visit: Payer: 59 | Admitting: Family Medicine

## 2022-03-09 ENCOUNTER — Encounter: Payer: Self-pay | Admitting: Family Medicine

## 2022-03-09 ENCOUNTER — Other Ambulatory Visit: Payer: Self-pay

## 2022-03-09 VITALS — BP 124/78 | HR 94 | Resp 16 | Ht 61.0 in | Wt 278.0 lb

## 2022-03-09 DIAGNOSIS — Z1211 Encounter for screening for malignant neoplasm of colon: Secondary | ICD-10-CM

## 2022-03-09 DIAGNOSIS — K219 Gastro-esophageal reflux disease without esophagitis: Secondary | ICD-10-CM

## 2022-03-09 DIAGNOSIS — E1142 Type 2 diabetes mellitus with diabetic polyneuropathy: Secondary | ICD-10-CM

## 2022-03-09 DIAGNOSIS — M1712 Unilateral primary osteoarthritis, left knee: Secondary | ICD-10-CM

## 2022-03-09 DIAGNOSIS — J4540 Moderate persistent asthma, uncomplicated: Secondary | ICD-10-CM

## 2022-03-09 DIAGNOSIS — I10 Essential (primary) hypertension: Secondary | ICD-10-CM

## 2022-03-09 DIAGNOSIS — E559 Vitamin D deficiency, unspecified: Secondary | ICD-10-CM | POA: Diagnosis not present

## 2022-03-09 DIAGNOSIS — Z1231 Encounter for screening mammogram for malignant neoplasm of breast: Secondary | ICD-10-CM

## 2022-03-09 DIAGNOSIS — E538 Deficiency of other specified B group vitamins: Secondary | ICD-10-CM

## 2022-03-09 DIAGNOSIS — M5416 Radiculopathy, lumbar region: Secondary | ICD-10-CM | POA: Insufficient documentation

## 2022-03-09 DIAGNOSIS — E1169 Type 2 diabetes mellitus with other specified complication: Secondary | ICD-10-CM | POA: Diagnosis not present

## 2022-03-09 DIAGNOSIS — E785 Hyperlipidemia, unspecified: Secondary | ICD-10-CM

## 2022-03-09 LAB — POCT GLYCOSYLATED HEMOGLOBIN (HGB A1C): Hemoglobin A1C: 6.8 % — AB (ref 4.0–5.6)

## 2022-03-09 MED ORDER — TRELEGY ELLIPTA 100-62.5-25 MCG/ACT IN AEPB
1.0000 | INHALATION_SPRAY | Freq: Every day | RESPIRATORY_TRACT | 3 refills | Status: DC
Start: 2022-03-09 — End: 2022-03-09

## 2022-03-09 MED ORDER — BUDESONIDE-FORMOTEROL FUMARATE 160-4.5 MCG/ACT IN AERO: 2.0000 | INHALATION_SPRAY | Freq: Two times a day (BID) | RESPIRATORY_TRACT | 5 refills | Status: DC

## 2022-03-09 MED ORDER — VALSARTAN-HYDROCHLOROTHIAZIDE 160-12.5 MG PO TABS: 1.0000 | ORAL_TABLET | Freq: Every day | ORAL | 1 refills | Status: DC

## 2022-03-09 MED ORDER — PIOGLITAZONE HCL 15 MG PO TABS: 15.0000 mg | ORAL_TABLET | Freq: Every day | ORAL | 1 refills | Status: DC

## 2022-03-09 MED ORDER — MONTELUKAST SODIUM 10 MG PO TABS: ORAL_TABLET | ORAL | 1 refills | Status: DC

## 2022-03-09 MED ORDER — MELOXICAM 7.5 MG PO TABS: 7.5000 mg | ORAL_TABLET | Freq: Two times a day (BID) | ORAL | 1 refills | Status: DC

## 2022-03-09 MED ORDER — DAPAGLIFLOZIN PROPANEDIOL 10 MG PO TABS: ORAL_TABLET | ORAL | 1 refills | Status: DC

## 2022-03-09 MED ORDER — PRAVASTATIN SODIUM 40 MG PO TABS: 40.0000 mg | ORAL_TABLET | ORAL | 1 refills | Status: DC

## 2022-03-09 MED ORDER — EZETIMIBE 10 MG PO TABS: 10.0000 mg | ORAL_TABLET | Freq: Every day | ORAL | 1 refills | Status: DC

## 2022-03-10 LAB — LIPID PANEL
Cholesterol: 251 mg/dL — ABNORMAL HIGH (ref <200)
HDL: 47 mg/dL — ABNORMAL LOW (ref >=50)
LDL Cholesterol (Calc): 155 mg/dL (calc) — ABNORMAL HIGH
Non-HDL Cholesterol (Calc): 204 mg/dL (calc) — ABNORMAL HIGH (ref <130)
Total CHOL/HDL Ratio: 5.3 (calc) — ABNORMAL HIGH (ref <5.0)
Triglycerides: 319 mg/dL — ABNORMAL HIGH (ref <150)

## 2022-03-10 LAB — COMPLETE METABOLIC PANEL WITH GFR
AG Ratio: 1.6 (calc) (ref 1.0–2.5)
ALT: 15 U/L (ref 6–29)
AST: 11 U/L (ref 10–35)
Albumin: 4.5 g/dL (ref 3.6–5.1)
Alkaline phosphatase (APISO): 63 U/L (ref 31–125)
BUN: 16 mg/dL (ref 7–25)
CO2: 28 mmol/L (ref 20–32)
Calcium: 9.5 mg/dL (ref 8.6–10.2)
Chloride: 100 mmol/L (ref 98–110)
Creat: 0.84 mg/dL (ref 0.50–0.99)
Globulin: 2.9 g/dL (calc) (ref 1.9–3.7)
Glucose, Bld: 109 mg/dL — ABNORMAL HIGH (ref 65–99)
Potassium: 3.6 mmol/L (ref 3.5–5.3)
Sodium: 140 mmol/L (ref 135–146)
Total Bilirubin: 0.4 mg/dL (ref 0.2–1.2)
Total Protein: 7.4 g/dL (ref 6.1–8.1)
eGFR: 85 mL/min/{1.73_m2} (ref >=60)

## 2022-03-10 LAB — MICROALBUMIN / CREATININE URINE RATIO
Creatinine, Urine: 55 mg/dL (ref 20–275)
Microalb, Ur: <0.2 mg/dL

## 2022-03-26 NOTE — Patient Instructions (Signed)
Preventive Care 40-50 Years Old, Female Preventive care refers to lifestyle choices and visits with your health care provider that can promote health and wellness. Preventive care visits are also called wellness exams. What can I expect for my preventive care visit? Counseling Your health care provider may ask you questions about your: Medical history, including: Past medical problems. Family medical history. Pregnancy history. Current health, including: Menstrual cycle. Method of birth control. Emotional well-being. Home life and relationship well-being. Sexual activity and sexual health. Lifestyle, including: Alcohol, nicotine or tobacco, and drug use. Access to firearms. Diet, exercise, and sleep habits. Work and work environment. Sunscreen use. Safety issues such as seatbelt and bike helmet use. Physical exam Your health care provider will check your: Height and weight. These may be used to calculate your BMI (body mass index). BMI is a measurement that tells if you are at a healthy weight. Waist circumference. This measures the distance around your waistline. This measurement also tells if you are at a healthy weight and may help predict your risk of certain diseases, such as type 2 diabetes and high blood pressure. Heart rate and blood pressure. Body temperature. Skin for abnormal spots. What immunizations do I need?  Vaccines are usually given at various ages, according to a schedule. Your health care provider will recommend vaccines for you based on your age, medical history, and lifestyle or other factors, such as travel or where you work. What tests do I need? Screening Your health care provider may recommend screening tests for certain conditions. This may include: Lipid and cholesterol levels. Diabetes screening. This is done by checking your blood sugar (glucose) after you have not eaten for a while (fasting). Pelvic exam and Pap test. Hepatitis B test. Hepatitis C  test. HIV (human immunodeficiency virus) test. STI (sexually transmitted infection) testing, if you are at risk. Lung cancer screening. Colorectal cancer screening. Mammogram. Talk with your health care provider about when you should start having regular mammograms. This may depend on whether you have a family history of breast cancer. BRCA-related cancer screening. This may be done if you have a family history of breast, ovarian, tubal, or peritoneal cancers. Bone density scan. This is done to screen for osteoporosis. Talk with your health care provider about your test results, treatment options, and if necessary, the need for more tests. Follow these instructions at home: Eating and drinking  Eat a diet that includes fresh fruits and vegetables, whole grains, lean protein, and low-fat dairy products. Take vitamin and mineral supplements as recommended by your health care provider. Do not drink alcohol if: Your health care provider tells you not to drink. You are pregnant, may be pregnant, or are planning to become pregnant. If you drink alcohol: Limit how much you have to 0-1 drink a day. Know how much alcohol is in your drink. In the [place] one drink equals one 12 oz bottle of beer (355 mL), one 5 oz glass of wine (148 mL), or one 1 oz glass of hard liquor (44 mL). Lifestyle Brush your teeth every morning and night with fluoride toothpaste. Floss one time each day. Exercise for at least 30 minutes 5 or more days each week. Do not use any products that contain nicotine or tobacco. These products include cigarettes, chewing tobacco, and vaping devices, such as e-cigarettes. If you need help quitting, ask your health care provider. Do not use drugs. If you are sexually active, practice safe sex. Use a condom or other form of protection to   prevent STIs. If you do not wish to become pregnant, use a form of birth control. If you plan to become pregnant, see your health care provider for a  prepregnancy visit. Take aspirin only as told by your health care provider. Make sure that you understand how much to take and what form to take. Work with your health care provider to find out whether it is safe and beneficial for you to take aspirin daily. Find healthy ways to manage stress, such as: Meditation, yoga, or listening to music. Journaling. Talking to a trusted person. Spending time with friends and family. Minimize exposure to UV radiation to reduce your risk of skin cancer. Safety Always wear your seat belt while driving or riding in a vehicle. Do not drive: If you have been drinking alcohol. Do not ride with someone who has been drinking. When you are tired or distracted. While texting. If you have been using any mind-altering substances or drugs. Wear a helmet and other protective equipment during sports activities. If you have firearms in your house, make sure you follow all gun safety procedures. Seek help if you have been physically or sexually abused. What's next? Visit your health care provider once a year for an annual wellness visit. Ask your health care provider how often you should have your eyes and teeth checked. Stay up to date on all vaccines. This information is not intended to replace advice given to you by your health care provider. Make sure you discuss any questions you have with your health care provider. Document Revised: 08/17/2020 Document Reviewed: 08/17/2020 Elsevier Patient Education  [place].

## 2022-03-26 NOTE — Progress Notes (Signed)
Name: Kelsey Alvarez   MRN: [id number]    DOB: 08/30/1970   Date:03/27/2022       Progress Note  Subjective  Chief Complaint  Annual Exam  HPI  Patient presents for annual CPE.  Diet: trying to eat more vegetables and fruit, eats more chicken than red meat Exercise:  discussed 150 minutes per week - can do a home walking program from [employer]  Last Eye Exam: she is due for exam  Last Dental Exam: not up to date   [place] Visit from 03/27/2022 in [facility]  AUDIT-C Score 1      Depression: Phq 9 is  negative    03/27/2022    8:23 AM 03/09/2022    2:39 PM 11/07/2021    8:20 AM 06/19/2021   11:33 AM 03/14/2021   10:06 AM  Depression screen PHQ 2/9  Decreased Interest 0 0 0 0 0  Down, Depressed, Hopeless 0 0 0 0 0  PHQ - 2 Score 0 0 0 0 0  Altered sleeping 0 3 3 0 0  Tired, decreased energy 0 3 0 0 0  Change in appetite 0 0 0 0 0  Feeling bad or failure about yourself  0 0 0 0 0  Trouble concentrating 0 0 0 0 0  Moving slowly or fidgety/restless 0 0 0 0 0  Suicidal thoughts 0 0 0 0 0  PHQ-9 Score 0 6 3 0 0   Hypertension: BP Readings from Last 3 Encounters:  03/27/22 126/72  03/09/22 124/78  11/07/21 126/76   Obesity: Wt Readings from Last 3 Encounters:  03/27/22 280 lb 12.8 oz (127.4 kg)  03/09/22 278 lb (126.1 kg)  11/07/21 281 lb (127.5 kg)   BMI Readings from Last 3 Encounters:  03/27/22 53.06 kg/m  03/09/22 52.53 kg/m  11/07/21 51.40 kg/m     Vaccines:   Tdap: up to date Shingrix: N/A Pneumonia: up to date  Flu: up to date COVID-44: she had two    Hep C Screening: 03/19/17 STD testing and prevention (HIV/chl/gon/syphilis): 04/23/17 Intimate partner violence: negative screen  Sexual History : one partner, female, sometimes has discomfort - history of endometriosis  Menstrual History/LMP/Abnormal Bleeding: skipping cycles , last one was early in 2023 - not sure of the date, discussed one year without periods is  menopause  Discussed importance of follow up if any post-menopausal bleeding: yes  Incontinence Symptoms: negative for symptoms   Breast cancer:  - Last Mammogram: Scheduled for 1/224/24 - BRCA gene screening: mother had ovarian cancer and now has kidney cancer- does not qualify , paternal uncle had colon cancer   Osteoporosis Prevention : Discussed high calcium and vitamin D supplementation, weight bearing exercises Bone density: N/A  Cervical cancer screening: 12/08/20  Skin cancer: Discussed monitoring for atypical lesions  Colorectal cancer: Ordered Cologuard 03/09/22  she already received the package and will get it done this week  Lung cancer:  Low Dose CT Chest recommended if Age 52-80 years, 20 pack-year currently smoking OR have quit w/in 15years. Patient does not qualify for screen   ECG: 05/19/20  Advanced Care Planning: A voluntary discussion about advance care planning including the explanation and discussion of advance directives.  Discussed health care proxy and Living will, and the patient was able to identify a health care proxy as husband .  Patient does not have a living will and power of attorney of health care   Lipids: Lab Results  Component Value  Date   CHOL 251 (H) 03/09/2022   CHOL 228 (H) 03/14/2021   CHOL 182 02/03/2020   Lab Results  Component Value Date   HDL 47 (L) 03/09/2022   HDL 42 03/14/2021   HDL 38 (L) 02/03/2020   Lab Results  Component Value Date   LDLCALC 155 (H) 03/09/2022   LDLCALC 139 (H) 03/14/2021   LDLCALC 117 (H) 02/03/2020   Lab Results  Component Value Date   TRIG 319 (H) 03/09/2022   TRIG 258 (H) 03/14/2021   TRIG 159 (H) 02/03/2020   Lab Results  Component Value Date   CHOLHDL 5.3 (H) 03/09/2022   CHOLHDL 5.4 (H) 03/14/2021   CHOLHDL 4.8 02/03/2020   No results found for: "LDLDIRECT"  Glucose: Glucose  Date Value Ref Range Status  03/14/2021 108 (H) 70 - 99 mg/dL Final  12/01/2013 100 (H) 65 - 99 mg/dL Final   09/01/2013 142 (H) 65 - 99 mg/dL Final  11/20/2012 135 (H) 65 - 99 mg/dL Final   Glucose, Bld  Date Value Ref Range Status  03/09/2022 109 (H) 65 - 99 mg/dL Final    Comment:    .            Fasting reference interval . For someone without known diabetes, a glucose value between 100 and 125 mg/dL is consistent with prediabetes and should be confirmed with a follow-up test. .   06/13/2021 140 (H) 70 - 99 mg/dL Final    Comment:    Glucose reference range applies only to samples taken after fasting for at least 8 hours.  02/03/2020 157 (H) 65 - 99 mg/dL Final    Comment:    .            Fasting reference interval . For someone without known diabetes, a glucose value >125 mg/dL indicates that they may have diabetes and this should be confirmed with a follow-up test. .    Glucose-Capillary  Date Value Ref Range Status  08/18/2017 217 (H) 65 - 99 mg/dL Final  10/12/2015 105 (H) 65 - 99 mg/dL Final    Patient Active Problem List   Diagnosis Date Noted   Localized osteoarthritis of left knee 03/09/2022   Lumbar back pain with radiculopathy affecting left lower extremity 03/09/2022   Dyslipidemia associated with type 2 diabetes mellitus ([place]) 01/20/2018   Endometriosis 11/06/2017   Menorrhagia with irregular cycle 10/28/2017   Fibroid uterus 10/28/2017   Carpal tunnel syndrome on both sides 10/17/2017   B12 deficiency 04/27/2016   History of endometriosis 11/17/2015   Oligomenorrhea 10/12/2015   Benign essential HTN 12/28/2014   Cervical polyp 12/28/2014   Controlled type 2 diabetes mellitus with microalbuminuria ([place]) 12/28/2014   Dyslipidemia 12/28/2014   Dysmenorrhea 12/28/2014   Edema 12/28/2014   Female infertility 12/28/2014   Gastro-esophageal reflux disease without esophagitis 12/28/2014   Morbid obesity ([place]) 12/28/2014   Asthma, moderate persistent, poorly-controlled 12/28/2014   Perennial allergic rhinitis 12/28/2014   Intermittent tremor 12/28/2014    Vitamin D deficiency 12/28/2014   Intermittent low back pain 12/28/2014   Left sciatic nerve pain 12/28/2014    Past Surgical History:  Procedure Laterality Date   APPENDECTOMY     cervical polyp removal     DILATION AND CURETTAGE OF UTERUS     HYSTEROSCOPY     HYSTEROSCOPY WITH D & C N/A 10/12/2015   Procedure: DILATATION AND CURETTAGE /HYSTEROSCOPY;  Surgeon: Will Bonnet, MD;  Location: [facility] ORS;  Service: Gynecology;  Laterality: N/A;   LAPAROSCOPIC BILATERAL SALPINGO OOPHERECTOMY N/A 10/12/2015   Procedure: LAPAROSCOPIC RIGHT SALPINGECTOMY, LEFT OOPHORECTOMY;  Surgeon: Will Bonnet, MD;  Location: [facility] OR[facility];  Service: Gynecology;  Laterality: N/A;   LAPAROSCOPIC OVARIAN CYSTECTOMY Bilateral 10/12/2015   Procedure: LAPAROSCOPIC OVARIAN CYSTECTOMY;  Surgeon: Will Bonnet, MD;  Location: [facility] OR[facility];  Service: Gynecology;  Laterality: Bilateral;   OOPHORECTOMY     OVARIAN CYST REMOVAL      Family History  Problem Relation Age of Onset   Hypertension Mother    Diabetes Mother    CAD Mother    Cancer Mother        cervical   Heart attack Mother 12       Triple Bypass   CAD Father    Hypertension Father    Arthritis Father    Thyroid disease Sister    Alzheimer's disease Maternal Grandmother    Alzheimer's disease Maternal Grandfather    Breast cancer Paternal Grandmother     Social History   Socioeconomic History   Marital status: Married    Spouse name: Danny   Number of children: 0   Years of education: Not on file   Highest education level: Some college, no degree  Occupational History   Occupation: Museum/gallery curator  Tobacco Use   Smoking status: Former    Years: 1.00    Types: Cigarettes    Start date: 12/03/2001    Quit date: 12/28/2002    Years since quitting: 19.2   Smokeless tobacco: Never   Tobacco comments:    smoked 3 to 4 cigarrettes a day for a year  Vaping Use   Vaping Use: Never used  Substance and Sexual Activity   Alcohol use: Yes     Alcohol/week: 0.0 standard drinks of alcohol    Comment: occasionally drinks margarita, once or twice a year   Drug use: No   Sexual activity: Yes    Partners: Male    Comment: Partial Hysterectomy  Other Topics Concern   Not on file  Social History Narrative   Not on file   Social Determinants of Health   Financial Resource Strain: Low Risk  (03/27/2022)   Overall Financial Resource Strain (CARDIA)    Difficulty of Paying Living Expenses: Not hard at all  Food Insecurity: No Food Insecurity (03/27/2022)   Hunger Vital Sign    Worried About Running Out of Food in the Last Year: Never true    [place] in the Last Year: Never true  Transportation Needs: No Transportation Needs (03/27/2022)   PRAPARE - Hydrologist (Medical): No    Lack of Transportation (Non-Medical): No  Physical Activity: Insufficiently Active (03/27/2022)   Exercise Vital Sign    Days of Exercise per Week: 1 day    Minutes of Exercise per Session: 10 min  Stress: No Stress Concern Present (03/27/2022)   [place]    Feeling of Stress : Not at all  Social Connections: Moderately Integrated (03/27/2022)   Social Connection and Isolation Panel [NHANES]    Frequency of Communication with Friends and Family: More than three times a week    Frequency of Social Gatherings with Friends and Family: Once a week    Attends Religious Services: More than 4 times per year    Active Member of [employer] or Organizations: No    Attends Archivist Meetings: Never    Marital Status:  Married  Intimate Partner Violence: Not At Risk (03/27/2022)   Humiliation, Afraid, Rape, and Kick questionnaire    Fear of Current or Ex-Partner: No    Emotionally Abused: No    Physically Abused: No    Sexually Abused: No     Current Outpatient Medications:    albuterol (VENTOLIN HFA) 108 (90 Base) MCG/ACT inhaler, TAKE 2 PUFFS BY MOUTH  EVERY 6 HOURS AS NEEDED FOR WHEEZE OR SHORTNESS OF BREATH, Disp: 8.5 each, Rfl: 0   Alpha Lipoic Acid 200 MG CAPS, Take by mouth., Disp: , Rfl:    budesonide-formoterol (SYMBICORT) 160-4.5 MCG/ACT inhaler, Inhale 2 puffs into the lungs 2 (two) times daily., Disp: 1 each, Rfl: 5   Cetirizine HCl (ZYRTEC PO), Take 10 mg by mouth daily., Disp: , Rfl:    Cholecalciferol (VITAMIN D) 2000 UNITS tablet, Take 1 tablet by mouth daily., Disp: , Rfl:    dapagliflozin propanediol (FARXIGA) 10 MG TABS tablet, TAKE 1 TABLET BY MOUTH EVERY DAY BEFORE BREAKFAST, Disp: 90 tablet, Rfl: 1   ezetimibe (ZETIA) 10 MG tablet, Take 1 tablet (10 mg total) by mouth daily., Disp: 90 tablet, Rfl: 1   fluticasone (FLONASE) 50 MCG/ACT nasal spray, Place 2 sprays into both nostrils daily., Disp: 16 g, Rfl: 5   gabapentin (NEURONTIN) 100 MG capsule, Take 1-2 capsules (100-200 mg total) by mouth 3 (three) times daily. 100 mg morning and afternoon and two at night, Disp: 120 capsule, Rfl: 0   ipratropium-albuterol (DUONEB) 0.5-2.5 (3) MG/3ML SOLN, Take 3 mLs by nebulization every 4 (four) hours as needed., Disp: 120 mL, Rfl: 0   meloxicam (MOBIC) 7.5 MG tablet, Take 1 tablet (7.5 mg total) by mouth 2 (two) times daily., Disp: 180 tablet, Rfl: 1   montelukast (SINGULAIR) 10 MG tablet, TAKE 1 TABLET BY MOUTH EVERYDAY AT BEDTIME, Disp: 90 tablet, Rfl: 1   pioglitazone (ACTOS) 15 MG tablet, Take 1 tablet (15 mg total) by mouth daily., Disp: 90 tablet, Rfl: 1   pravastatin (PRAVACHOL) 40 MG tablet, Take 1 tablet (40 mg total) by mouth 3 (three) times a week., Disp: 12 tablet, Rfl: 1   valsartan-hydrochlorothiazide (DIOVAN-HCT) 160-12.5 MG tablet, Take 1 tablet by mouth daily., Disp: 90 tablet, Rfl: 1  Allergies  Allergen Reactions   Fish Allergy Shortness Of Breath   Penicillins Anaphylaxis and Rash    Childhood allergy   Crestor [Rosuvastatin]     Headache    Levofloxacin Other (See Comments)    Cramping   Adhesive [Tape] Rash     Paper tape ok to use.     ROS  Constitutional: Negative for fever or weight change.  Respiratory: positive  for intermittent  cough and shortness of breath when walking in cold air .   Cardiovascular: Negative for chest pain or palpitations.  Gastrointestinal: Negative for abdominal pain, no bowel changes.  Musculoskeletal: Negative for gait problem or joint swelling.  Skin: positive for rash.  Neurological: Negative for dizziness or headache.  No other specific complaints in a complete review of systems (except as listed in HPI above).   Objective  Vitals:   03/27/22 0822  BP: 126/72  Pulse: 79  Resp: 16  Temp: 97.9 F (36.6 C)  TempSrc: Oral  SpO2: 97%  Weight: 280 lb 12.8 oz (127.4 kg)  Height: '5\' 1"'$  (1.549 m)    Body mass index is 53.06 kg/m.  Physical Exam  Constitutional: Patient appears well-developed and well-nourished. No distress.  HENT: Head: Normocephalic and atraumatic. Ears:  B TMs ok, no erythema or effusion; Nose: Nose normal. Mouth/Throat: Oropharynx is clear and moist. No oropharyngeal exudate.  Eyes: Conjunctivae and EOM are normal. Pupils are equal, round, and reactive to light. No scleral icterus.  Neck: Normal range of motion. Neck supple. No JVD present. No thyromegaly present.  Cardiovascular: Normal rate, regular rhythm and normal heart sounds.  No murmur heard. No BLE edema. Pulmonary/Chest: Effort normal and breath sounds normal. No respiratory distress. Abdominal: Soft. Bowel sounds are normal, no distension. There is no tenderness. no masses Breast: no lumps or masses, no nipple discharge or rashes FEMALE GENITALIA: Not done  RECTAL: not done  Musculoskeletal: Normal range of motion, no joint effusions. No gross deformities Neurological: he is alert and oriented to person, place, and time. No cranial nerve deficit. Coordination, balance, strength, speech and gait are normal.  Skin: dry skin - worse on hands with signs of eczema advised  lotion and hydrocortisone  Psychiatric: Patient has a normal mood and affect. behavior is normal. Judgment and thought content normal.   Recent Results (from the past 2160 hour(s))  POCT HgB A1C     Status: Abnormal   Collection Time: 03/09/22  2:40 PM  Result Value Ref Range   Hemoglobin A1C 6.8 (A) 4.0 - 5.6 %   HbA1c POC (<> result, manual entry)     HbA1c, POC (prediabetic range)     HbA1c, POC (controlled diabetic range)    Urine Microalbumin w/creat. ratio     Status: None   Collection Time: 03/09/22  3:33 PM  Result Value Ref Range   Creatinine, Urine 55 20 - 275 mg/dL   Microalb, Ur <0.2 mg/dL    Comment: Reference Range Not established    Microalb Creat Ratio NOTE <30 mcg/mg creat    Comment: NOTE: The urine albumin value is less than  0.2 mg/dL therefore we are unable to calculate  excretion and/or creatinine ratio. . The ADA defines abnormalities in albumin excretion as follows: Marland Kitchen Albuminuria Category        Result (mcg/mg creatinine) . Normal to Mildly increased   <30 Moderately increased         30-299  Severely increased           > OR = 300 . The ADA recommends that at least two of three specimens collected within a 3-6 month period be abnormal before considering a patient to be within a diagnostic category.   Lipid panel     Status: Abnormal   Collection Time: 03/09/22  3:33 PM  Result Value Ref Range   Cholesterol 251 (H) <200 mg/dL   HDL 47 (L) > OR = 50 mg/dL   Triglycerides 319 (H) <150 mg/dL    Comment: . If a non-fasting specimen was collected, consider repeat triglyceride testing on a fasting specimen if clinically indicated.  Yates Decamp et al. J. of Clin. Lipidol. 0630;1:601-093. Marland Kitchen    LDL Cholesterol (Calc) 155 (H) mg/dL (calc)    Comment: Reference range: <100 . Desirable range <100 mg/dL for primary prevention;   <70 mg/dL for patients with CHD or diabetic patients  with > or = 2 CHD risk factors. Marland Kitchen LDL-C is now calculated using the  Martin-Hopkins  calculation, which is a validated novel method providing  better accuracy than the Friedewald equation in the  estimation of LDL-C.  Cresenciano Genre et al. Annamaria Helling. 2355;732(20): 2061-2068  (http://education.[facility].com/faq/FAQ164)    Total CHOL/HDL Ratio 5.3 (H) <5.0 (calc)   Non-HDL Cholesterol (Calc) 204 (  H) <130 mg/dL (calc)    Comment: For patients with diabetes plus 1 major ASCVD risk  factor, treating to a non-HDL-C goal of <100 mg/dL  (LDL-C of <70 mg/dL) is considered a therapeutic  option.   COMPLETE METABOLIC PANEL WITH GFR     Status: Abnormal   Collection Time: 03/09/22  3:33 PM  Result Value Ref Range   Glucose, Bld 109 (H) 65 - 99 mg/dL    Comment: .            Fasting reference interval . For someone without known diabetes, a glucose value between 100 and 125 mg/dL is consistent with prediabetes and should be confirmed with a follow-up test. .    BUN 16 7 - 25 mg/dL   Creat 0.84 0.50 - 0.99 mg/dL   eGFR 85 > OR = 60 mL/min/1.36m   BUN/Creatinine Ratio SEE NOTE: 6 - 22 (calc)    Comment:    Not Reported: BUN and Creatinine are within    reference range. .    Sodium 140 135 - 146 mmol/L   Potassium 3.6 3.5 - 5.3 mmol/L   Chloride 100 98 - 110 mmol/L   CO2 28 20 - 32 mmol/L   Calcium 9.5 8.6 - 10.2 mg/dL   Total Protein 7.4 6.1 - 8.1 g/dL   Albumin 4.5 3.6 - 5.1 g/dL   Globulin 2.9 1.9 - 3.7 g/dL (calc)   AG Ratio 1.6 1.0 - 2.5 (calc)   Total Bilirubin 0.4 0.2 - 1.2 mg/dL   Alkaline phosphatase (APISO) 63 31 - 125 U/L   AST 11 10 - 35 U/L   ALT 15 6 - 29 U/L     Fall Risk:    03/27/2022    8:24 AM 03/09/2022    2:39 PM 11/07/2021    8:20 AM 06/19/2021   11:33 AM 03/14/2021   10:06 AM  Fall Risk   Falls in the past year? 0 0 0 0 0  Number falls in past yr:  0 0 0   Injury with Fall?  0 0 0   Risk for fall due to : No Fall Risks No Fall Risks No Fall Risks No Fall Risks   Follow up Falls prevention discussed;Education provided;Falls  evaluation completed Falls prevention discussed Falls prevention discussed Falls prevention discussed Falls prevention discussed     Functional Status Survey: Is the patient deaf or have difficulty hearing?: No Does the patient have difficulty seeing, even when wearing glasses/contacts?: No Does the patient have difficulty concentrating, remembering, or making decisions?: No Does the patient have difficulty walking or climbing stairs?: Yes Does the patient have difficulty dressing or bathing?: No Does the patient have difficulty doing errands alone such as visiting a doctor's office or shopping?: No   Assessment & Plan  1. Well adult exam  Discussed increase in physical activity   2. Colon cancer screening  - Cologuard   -USPSTF grade A and B recommendations reviewed with patient; age-appropriate recommendations, preventive care, screening tests, etc discussed and encouraged; healthy living encouraged; see AVS for patient education given to patient -Discussed importance of 150 minutes of physical activity weekly, eat two servings of fish weekly, eat one serving of tree nuts ( cashews, pistachios, pecans, almonds..Marland Kitchen every other day, eat 6 servings of fruit/vegetables daily and drink plenty of water and avoid sweet beverages.   -Reviewed Health Maintenance: yes

## 2022-03-27 ENCOUNTER — Encounter: Payer: Self-pay | Admitting: Family Medicine

## 2022-03-27 ENCOUNTER — Other Ambulatory Visit: Payer: Self-pay | Admitting: Family Medicine

## 2022-03-27 ENCOUNTER — Telehealth: Payer: Self-pay | Admitting: Family Medicine

## 2022-03-27 ENCOUNTER — Ambulatory Visit (INDEPENDENT_AMBULATORY_CARE_PROVIDER_SITE_OTHER): Payer: 59 | Admitting: Family Medicine

## 2022-03-27 VITALS — BP 126/72 | HR 79 | Temp 97.9°F | Resp 16 | Ht 61.0 in | Wt 280.8 lb

## 2022-03-27 DIAGNOSIS — Z1211 Encounter for screening for malignant neoplasm of colon: Secondary | ICD-10-CM | POA: Diagnosis not present

## 2022-03-27 DIAGNOSIS — Z0000 Encounter for general adult medical examination without abnormal findings: Secondary | ICD-10-CM

## 2022-03-27 DIAGNOSIS — E1169 Type 2 diabetes mellitus with other specified complication: Secondary | ICD-10-CM

## 2022-03-27 MED ORDER — EMPAGLIFLOZIN 25 MG PO TABS: 25.0000 mg | ORAL_TABLET | Freq: Every day | ORAL | 0 refills | Status: DC

## 2022-03-27 NOTE — Telephone Encounter (Signed)
Copied from [place] [id number]. Topic: General - Other >> Mar 27, 2022 11:52 AM Ludger Nutting wrote: Patient states that dapagliflozin propanediol (FARXIGA) 10 MG TABS tablet is no longer covered by her insurance. Is there an alterative she could take? Please advise.

## 2022-03-28 ENCOUNTER — Ambulatory Visit
Admission: RE | Admit: 2022-03-28 | Discharge: 2022-03-28 | Disposition: A | Discharge status: Home / Self Care | Payer: 59 | Source: Ambulatory Visit | Attending: Family Medicine | Admitting: Family Medicine

## 2022-03-28 DIAGNOSIS — Z1231 Encounter for screening mammogram for malignant neoplasm of breast: Secondary | ICD-10-CM | POA: Diagnosis not present

## 2022-05-23 LAB — COLOGUARD: COLOGUARD: NEGATIVE

## 2022-06-14 NOTE — Progress Notes (Addendum)
Name: Kelsey Alvarez   MRN: [id number]    DOB: 10/24/1970   Date:06/15/2022       Progress Note  Subjective  Chief Complaint  Follow Up  HPI  DM:  hgbA1C was 7.9%, it went up to 8.6 % to 9 % 6.3 % , 6.5 % , 6.2 % , 6.8 % and today is down to 6.5%   She was unable to tolerate Metformin or GLP-1 agonist , she is current on Jardiance  and started on Actos Fall 2022 .She still has neuropathy symptoms ( hands and feet)  she takes gabapentin prn since alpha lipoic acid recommended by neurologist has improved symptoms. . She denies polyphagia but has noticed increase in thirsty lately and also not as compliant with her diet over the holidays. She states pravastatin causes headaches and is only taking it a few times a week , she is also on Zetia   Morbid Obesity:off Ozempic, she stopped because she could not eat anything, she had lost 12 lbs in a short period of time,  we tried switching to Rybelsus but only able to tolerate 3 mg dose, her weight went down 8 lbs but she also stopped due to side effects.  Weight is back up again from 266 lbs to 283.5 lbs   Periumbilical pain: going on for over one year, intermittent, she had laparoscopic surgery before, episodes usually when wearing tight clothes , no redness or increase in warmth Stable    Asthma: she has moderate asthma, she is currently using Symbicort but it is too costly and asked me to switch her to Qvar, explained not equivalent medications and to call back if not controlling symptoms after the change. She continues to have  SOB with strenous activity. She has mild intermittent nocturnal cough but no wheezing    Hyperlipidemia: she was on Atorvastatin but LDL was not at goal, we switched to Crestor but she could not tolerate it , she is now on pravastatin but skips dose because it causes a headache but taking zetia daily   HTN: taking Valsartan hctz  no side effects. No chest pain , SOB is stable.    Low back pain : used to be intermittent across  lower back, but worse since last Summer  when standing it radiates to lateral thighs.We gave her gabapentin and meloxicam, she was seen by Ortho and was told she has arthritis. She is taking Meloxicam twice daily and has helped with symptoms, discussed potential side effects of NSAID's   Left ankle pain and swelling: she had a left knee sprain back in 2017, saw Dr. Logan BoresEvans in 2018 and diagnosed with synovitis of ankle, she states now pain is worse on left lateral foot but also left ankle and has swelling present since Dec and getting worse. We will refer her back for evaluation   Left ear pain: going on for month , she went to urgent care with dizziness, at one point OM and continues to have intermittent left ear pain that is not improving with flonase  Patient Active Problem List   Diagnosis Date Noted   Localized osteoarthritis of left knee 03/09/2022   Lumbar back pain with radiculopathy affecting left lower extremity 03/09/2022   Dyslipidemia associated with type 2 diabetes mellitus 01/20/2018   Endometriosis 11/06/2017   Menorrhagia with irregular cycle 10/28/2017   Fibroid uterus 10/28/2017   Carpal tunnel syndrome on both sides 10/17/2017   B12 deficiency 04/27/2016   History of endometriosis 11/17/2015   Oligomenorrhea  10/12/2015   Benign essential HTN 12/28/2014   Cervical polyp 12/28/2014   Controlled type 2 diabetes mellitus with microalbuminuria 12/28/2014   Dyslipidemia 12/28/2014   Dysmenorrhea 12/28/2014   Edema 12/28/2014   Female infertility 12/28/2014   Gastro-esophageal reflux disease without esophagitis 12/28/2014   Morbid obesity 12/28/2014   Asthma, moderate persistent, poorly-controlled 12/28/2014   Perennial allergic rhinitis 12/28/2014   Intermittent tremor 12/28/2014   Vitamin D deficiency 12/28/2014   Intermittent low back pain 12/28/2014   Left sciatic nerve pain 12/28/2014    Past Surgical History:  Procedure Laterality Date   APPENDECTOMY     cervical  polyp removal     DILATION AND CURETTAGE OF UTERUS     HYSTEROSCOPY     HYSTEROSCOPY WITH D & C N/A 10/12/2015   Procedure: DILATATION AND CURETTAGE /HYSTEROSCOPY;  Surgeon: Conard Novak, MD;  Location: [facility];  Service: Gynecology;  Laterality: N/A;   LAPAROSCOPIC BILATERAL SALPINGO OOPHERECTOMY N/A 10/12/2015   Procedure: LAPAROSCOPIC RIGHT SALPINGECTOMY, LEFT OOPHORECTOMY;  Surgeon: Conard Novak, MD;  Location: [facility];  Service: Gynecology;  Laterality: N/A;   LAPAROSCOPIC OVARIAN CYSTECTOMY Bilateral 10/12/2015   Procedure: LAPAROSCOPIC OVARIAN CYSTECTOMY;  Surgeon: Conard Novak, MD;  Location: [facility];  Service: Gynecology;  Laterality: Bilateral;   OOPHORECTOMY     OVARIAN CYST REMOVAL      Family History  Problem Relation Age of Onset   Hypertension Mother    Diabetes Mother    CAD Mother    Cancer Mother        cervical   Heart attack Mother 69       Triple Bypass   CAD Father    Hypertension Father    Arthritis Father    Thyroid disease Sister    Alzheimer's disease Maternal Grandmother    Alzheimer's disease Maternal Grandfather    Breast cancer Paternal Grandmother     Social History   Tobacco Use   Smoking status: Former    Years: 1    Types: Cigarettes    Start date: 12/03/2001    Quit date: 12/28/2002    Years since quitting: 19.4   Smokeless tobacco: Never   Tobacco comments:    smoked 3 to 4 cigarrettes a day for a year  Substance Use Topics   Alcohol use: Yes    Alcohol/week: 0.0 standard drinks of alcohol    Comment: occasionally drinks margarita, once or twice a year     Current Outpatient Medications:    albuterol (VENTOLIN HFA) 108 (90 Base) MCG/ACT inhaler, TAKE 2 PUFFS BY MOUTH EVERY 6 HOURS AS NEEDED FOR WHEEZE OR SHORTNESS OF BREATH, Disp: 8.5 each, Rfl: 0   Alpha Lipoic Acid 200 MG CAPS, Take by mouth., Disp: , Rfl:    beclomethasone (QVAR REDIHALER) 40 MCG/ACT inhaler, Inhale 2 puffs into the lungs 2 (two) times daily.,  Disp: 1 each, Rfl: 2   Cetirizine HCl (ZYRTEC PO), Take 10 mg by mouth daily., Disp: , Rfl:    Cholecalciferol (VITAMIN D) 2000 UNITS tablet, Take 1 tablet by mouth daily., Disp: , Rfl:    ezetimibe (ZETIA) 10 MG tablet, Take 1 tablet (10 mg total) by mouth daily., Disp: 90 tablet, Rfl: 1   fluticasone (FLONASE) 50 MCG/ACT nasal spray, Place 2 sprays into both nostrils daily., Disp: 16 g, Rfl: 5   gabapentin (NEURONTIN) 100 MG capsule, Take 1-2 capsules (100-200 mg total) by mouth 3 (three) times daily. 100 mg morning and afternoon and two  at night, Disp: 120 capsule, Rfl: 0   ipratropium-albuterol (DUONEB) 0.5-2.5 (3) MG/3ML SOLN, Take 3 mLs by nebulization every 4 (four) hours as needed., Disp: 120 mL, Rfl: 0   meclizine (ANTIVERT) 12.5 MG tablet, Take 12.5-25 mg by mouth 3 (three) times daily as needed., Disp: , Rfl:    meloxicam (MOBIC) 7.5 MG tablet, Take 1 tablet (7.5 mg total) by mouth 2 (two) times daily., Disp: 180 tablet, Rfl: 1   montelukast (SINGULAIR) 10 MG tablet, TAKE 1 TABLET BY MOUTH EVERYDAY AT BEDTIME, Disp: 90 tablet, Rfl: 1   pioglitazone (ACTOS) 15 MG tablet, Take 1 tablet (15 mg total) by mouth daily., Disp: 90 tablet, Rfl: 1   pravastatin (PRAVACHOL) 40 MG tablet, Take 1 tablet (40 mg total) by mouth 3 (three) times a week., Disp: 12 tablet, Rfl: 1   valsartan-hydrochlorothiazide (DIOVAN-HCT) 160-12.5 MG tablet, Take 1 tablet by mouth daily., Disp: 90 tablet, Rfl: 1   empagliflozin (JARDIANCE) 25 MG TABS tablet, Take 1 tablet (25 mg total) by mouth daily before breakfast., Disp: 90 tablet, Rfl: 0  Allergies  Allergen Reactions   Fish Allergy Shortness Of Breath   Penicillins Anaphylaxis and Rash    Childhood allergy   Crestor [Rosuvastatin]     Headache    Levofloxacin Other (See Comments)    Cramping   Adhesive [Tape] Rash    Paper tape ok to use.    I personally reviewed active problem list, medication list, allergies, family history, social history, health  maintenance with the patient/caregiver today.   ROS  Constitutional: Negative for fever or weight change.  Respiratory: Negative for cough and shortness of breath.   Cardiovascular: Negative for chest pain or palpitations.  Gastrointestinal: Negative for abdominal pain, no bowel changes.  Musculoskeletal: Negative for gait problem or joint swelling.  Skin: Negative for rash.  Neurological: Negative for dizziness or headache.  No other specific complaints in a complete review of systems (except as listed in HPI above).   Objective  Vitals:   06/15/22 0912  BP: 128/72  Pulse: 71  Resp: 16  Temp: 97.8 F (36.6 C)  TempSrc: Oral  SpO2: 98%  Weight: 283 lb 8 oz (128.6 kg)  Height: 5' 2.5" (1.588 m)    Body mass index is 51.03 kg/m.  Physical Exam  Constitutional: Patient appears well-developed and well-nourished. Obese  No distress.  HEENT: head atraumatic, normocephalic, pupils equal and reactive to light, neck supple, Left TM showed some scarring  Cardiovascular: Normal rate, regular rhythm and normal heart sounds.  No murmur heard. Trace  BLE edema. No lesions on foot  Pulmonary/Chest: Effort normal and breath sounds normal. No respiratory distress. Abdominal: Soft.  There is no tenderness. Psychiatric: Patient has a normal mood and affect. behavior is normal. Judgment and thought content normal.    PHQ2/9:    06/15/2022    9:14 AM 03/27/2022    8:23 AM 03/09/2022    2:39 PM 11/07/2021    8:20 AM 06/19/2021   11:33 AM  Depression screen PHQ 2/9  Decreased Interest 0 0 0 0 0  Down, Depressed, Hopeless 0 0 0 0 0  PHQ - 2 Score 0 0 0 0 0  Altered sleeping 0 0 3 3 0  Tired, decreased energy 0 0 3 0 0  Change in appetite 0 0 0 0 0  Feeling bad or failure about yourself  0 0 0 0 0  Trouble concentrating 0 0 0 0 0  Moving slowly or  fidgety/restless 0 0 0 0 0  Suicidal thoughts 0 0 0 0 0  PHQ-9 Score 0 0 6 3 0    phq 9 is negative   Fall Risk:    06/15/2022    9:13  AM 03/27/2022    8:24 AM 03/09/2022    2:39 PM 11/07/2021    8:20 AM 06/19/2021   11:33 AM  Fall Risk   Falls in the past year? 0 0 0 0 0  Number falls in past yr:   0 0 0  Injury with Fall?   0 0 0  Risk for fall due to : No Fall Risks No Fall Risks No Fall Risks No Fall Risks No Fall Risks  Follow up Falls prevention discussed Falls prevention discussed;Education provided;Falls evaluation completed Falls prevention discussed Falls prevention discussed Falls prevention discussed      Functional Status Survey: Is the patient deaf or have difficulty hearing?: No Does the patient have difficulty seeing, even when wearing glasses/contacts?: No Does the patient have difficulty concentrating, remembering, or making decisions?: No Does the patient have difficulty walking or climbing stairs?: Yes Does the patient have difficulty dressing or bathing?: No Does the patient have difficulty doing errands alone such as visiting a doctor's office or shopping?: No    Assessment & Plan    1. Dyslipidemia associated with type 2 diabetes mellitus  - POCT HgB A1C - empagliflozin (JARDIANCE) 25 MG TABS tablet; Take 1 tablet (25 mg total) by mouth daily before breakfast.  Dispense: 90 tablet; Refill: 0  2. Lumbar back pain with radiculopathy affecting left lower extremity   3. Chronic foot pain, left  - Ambulatory referral to Podiatry  4. Vitamin D deficiency   5. B12 deficiency   6. Morbid obesity  Discussed with the patient the risk posed by an increased BMI. Discussed importance of portion control, calorie counting and at least 150 minutes of physical activity weekly. Avoid sweet beverages and drink more water. Eat at least 6 servings of fruit and vegetables daily    7. Asthma, moderate persistent, well-controlled  We will switch to Qvar as requested by patient  8. Gastro-esophageal reflux disease without esophagitis   9. Benign essential HTN  At goal

## 2022-06-15 ENCOUNTER — Encounter: Payer: Self-pay | Admitting: Family Medicine

## 2022-06-15 ENCOUNTER — Ambulatory Visit: Payer: 59 | Admitting: Family Medicine

## 2022-06-15 VITALS — BP 128/72 | HR 71 | Temp 97.8°F | Resp 16 | Ht 62.5 in | Wt 283.5 lb

## 2022-06-15 DIAGNOSIS — M5416 Radiculopathy, lumbar region: Secondary | ICD-10-CM

## 2022-06-15 DIAGNOSIS — M79672 Pain in left foot: Secondary | ICD-10-CM | POA: Diagnosis not present

## 2022-06-15 DIAGNOSIS — I10 Essential (primary) hypertension: Secondary | ICD-10-CM

## 2022-06-15 DIAGNOSIS — K219 Gastro-esophageal reflux disease without esophagitis: Secondary | ICD-10-CM

## 2022-06-15 DIAGNOSIS — E785 Hyperlipidemia, unspecified: Secondary | ICD-10-CM

## 2022-06-15 DIAGNOSIS — J4540 Moderate persistent asthma, uncomplicated: Secondary | ICD-10-CM

## 2022-06-15 DIAGNOSIS — G8929 Other chronic pain: Secondary | ICD-10-CM

## 2022-06-15 DIAGNOSIS — E1169 Type 2 diabetes mellitus with other specified complication: Secondary | ICD-10-CM

## 2022-06-15 DIAGNOSIS — E559 Vitamin D deficiency, unspecified: Secondary | ICD-10-CM | POA: Diagnosis not present

## 2022-06-15 DIAGNOSIS — E538 Deficiency of other specified B group vitamins: Secondary | ICD-10-CM

## 2022-06-15 DIAGNOSIS — H7392 Unspecified disorder of tympanic membrane, left ear: Secondary | ICD-10-CM

## 2022-06-15 LAB — POCT GLYCOSYLATED HEMOGLOBIN (HGB A1C): Hemoglobin A1C: 6.5 % — AB (ref 4.0–5.6)

## 2022-06-15 MED ORDER — QVAR REDIHALER 40 MCG/ACT IN AERB
2.0000 | INHALATION_SPRAY | Freq: Two times a day (BID) | RESPIRATORY_TRACT | 2 refills | Status: DC
Start: 2022-06-15 — End: 2022-11-20

## 2022-06-15 MED ORDER — EMPAGLIFLOZIN 25 MG PO TABS: 25.0000 mg | ORAL_TABLET | Freq: Every day | ORAL | 0 refills | Status: DC

## 2022-06-15 NOTE — Addendum Note (Signed)
Addended by: Ruel Favors on: 06/15/2022 09:52 AM   Modules accepted: Orders

## 2022-06-29 ENCOUNTER — Emergency Department
Admission: EM | Admit: 2022-06-29 | Discharge: 2022-06-29 | Disposition: A | Discharge status: Home / Self Care | Payer: 59 | Attending: Emergency Medicine | Admitting: Emergency Medicine

## 2022-06-29 ENCOUNTER — Emergency Department: Payer: 59

## 2022-06-29 ENCOUNTER — Other Ambulatory Visit: Payer: Self-pay

## 2022-06-29 DIAGNOSIS — Z87891 Personal history of nicotine dependence: Secondary | ICD-10-CM | POA: Insufficient documentation

## 2022-06-29 DIAGNOSIS — M7732 Calcaneal spur, left foot: Secondary | ICD-10-CM | POA: Diagnosis not present

## 2022-06-29 DIAGNOSIS — M79605 Pain in left leg: Secondary | ICD-10-CM | POA: Diagnosis present

## 2022-06-29 MED ORDER — ETODOLAC 400 MG PO TABS: 400.0000 mg | ORAL_TABLET | Freq: Two times a day (BID) | ORAL | 0 refills | Status: DC

## 2022-06-29 NOTE — Discharge Instructions (Addendum)
Keep your appointment with [facility] as scheduled.  Discontinue taking the meloxicam and begin taking the etodolac that was sent to the pharmacy today.  Use the cam walker boot anytime you are up walking to give you added support and protection of your ankle.  Ice and elevation anytime there is swelling.  You may take a limited amount of Tylenol in addition to this medication if additional pain medication is needed.

## 2022-06-29 NOTE — ED Provider Notes (Signed)
[facility] Provider Note    Event Date/Time   First MD Initiated Contact with Patient 06/29/22 1005     (approximate)   History   Leg Pain   HPI  Kelsey Alvarez is a 52 y.o. female   presents to the ED with complaint of low leg pain.  Patient states she initially began with left foot pain and saw her PCP approximately 2 to 3 weeks ago at which time she was referred to a "specialist".  Patient states that this morning the pain is up into her leg and pain has increased.  She states this is different than the pain that she is experienced in the past.  She denies any previous DVT, recent injury, is a former smoker quitting in 2004 and no recent injury.      Physical Exam   Triage Vital Signs: ED Triage Vitals  Enc Vitals Group     BP 06/29/22 0910 (!) 159/71     Pulse Rate 06/29/22 0910 87     Resp 06/29/22 0910 17     Temp 06/29/22 0910 97.8 F (36.6 C)     Temp src --      SpO2 06/29/22 0910 100 %     Weight --      Height --      Head Circumference --      Peak Flow --      Pain Score 06/29/22 0909 10     Pain Loc --      Pain Edu? --      Excl. in GC? --     Most recent vital signs: Vitals:   06/29/22 0910  BP: (!) 159/71  Pulse: 87  Resp: 17  Temp: 97.8 F (36.6 C)  SpO2: 100%     General: Awake, no distress.  CV:  Good peripheral perfusion.  Heart regular rate and rhythm. Resp:  Normal effort.  Clear bilaterally. Abd:  No distention.  Other:  Left lower extremity with generalized tenderness posteriorly and positive Homans' sign.  No erythema or warmth is appreciated.  There is also some edema present to the medial aspect of the lower extremity in comparison with the right.  No skin discoloration or abrasions are noted.   ED Results / Procedures / Treatments   Labs (all labs ordered are listed, but only abnormal results are displayed) Labs Reviewed - No data to display    RADIOLOGY  Ultrasound venous left lower  extremity per radiologist is negative for DVT.  Left ankle x-ray images were reviewed and interpreted by myself independent of the radiologist and no fracture was noted however degenerative changes and a calcaneal spur are present.   PROCEDURES:  Critical Care performed:   Procedures   MEDICATIONS ORDERED IN ED: Medications - No data to display   IMPRESSION / MDM / ASSESSMENT AND PLAN / ED COURSE  I reviewed the triage vital signs and the nursing notes.   Differential diagnosis includes, but is not limited to, DVT left lower extremity, left lower leg pain, left ankle pain, strain, fracture, degenerative joint disease, gout.  52 year old female presents to the ED with complaint of left lower extremity pain for which she has seen her PCP and treated for left ankle pain only.  She states that the pain today is different than what she is experienced in the past.  Venous ultrasound was negative for DVT and reassuring.  X-rays showed a calcaneal spur with some degenerative changes and patient was  made aware.  She is encouraged to keep her appointment with [facility].  I have discussed putting her in a cam walker to see if this gives her any relief while walking along with changing her meloxicam to etodolac 400 mg twice daily with food.      Patient's presentation is most consistent with acute presentation with potential threat to life or bodily function.  FINAL CLINICAL IMPRESSION(S) / ED DIAGNOSES   Final diagnoses:  Pain of left lower extremity  Calcaneal spur of foot, left     Rx / DC Orders   ED Discharge Orders          Ordered    etodolac (LODINE) 400 MG tablet  2 times daily        06/29/22 1256             Note:  This document was prepared using Dragon voice recognition software and may include unintentional dictation errors.   Tommi Rumps, PA-C 06/29/22 1306    Sharyn Creamer, MD 06/29/22 [phone]

## 2022-06-29 NOTE — ED Triage Notes (Signed)
Pt comes with c/o left leg pain. Pt states foot pain that started last year and has appt coming up. Pt states and now has a knot and the leg pain. Pt states is not on thinners.

## 2022-07-08 ENCOUNTER — Other Ambulatory Visit: Payer: Self-pay | Admitting: Family Medicine

## 2022-07-08 DIAGNOSIS — J4540 Moderate persistent asthma, uncomplicated: Secondary | ICD-10-CM

## 2022-07-12 ENCOUNTER — Ambulatory Visit: Payer: 59 | Admitting: Podiatry

## 2022-07-12 DIAGNOSIS — M7672 Peroneal tendinitis, left leg: Secondary | ICD-10-CM | POA: Diagnosis not present

## 2022-07-12 NOTE — Progress Notes (Signed)
Subjective:  Patient ID: Kelsey Alvarez, female    DOB: 1970-09-03,  MRN: [id number]  No chief complaint on file.   52 y.o. female presents with the above complaint.  Patient presents with left lateral foot pain.  Patient is going on for quite some time as well as gotten worse worse with ambulation worse with pressure he came out of nowhere pain scale 7 out of 10 dull achy in nature she would like for me to discuss treatment options.  She is a diabetic.   Review of Systems: Negative except as noted in the HPI. Denies N/V/F/Ch.  Past Medical History:  Diagnosis Date   Abnormal CBC    Allergy    Arthritis    left knee   Asthma    Cervical polyp    Chronic foot pain    Complication of anesthesia    takes longer to wake up.   Contact dermatitis    Diabetes mellitus without complication (HCC)    Dysmenorrhea    Edema    Female fertility problems    Fibroid uterus    left side, managed by Dr. Jean Rosenthal @ [facility]   GERD (gastroesophageal reflux disease)    Headache    history of migraines   Hyperlipidemia    Hypertension    Lumbago    Neuropathy    Obesity    Occasional tremors     Current Outpatient Medications:    albuterol (VENTOLIN HFA) 108 (90 Base) MCG/ACT inhaler, TAKE 2 PUFFS BY MOUTH EVERY 6 HOURS AS NEEDED FOR WHEEZE OR SHORTNESS OF BREATH, Disp: 8.5 each, Rfl: 0   Alpha Lipoic Acid 200 MG CAPS, Take by mouth., Disp: , Rfl:    beclomethasone (QVAR REDIHALER) 40 MCG/ACT inhaler, Inhale 2 puffs into the lungs 2 (two) times daily., Disp: 1 each, Rfl: 2   Cetirizine HCl (ZYRTEC PO), Take 10 mg by mouth daily., Disp: , Rfl:    Cholecalciferol (VITAMIN D) 2000 UNITS tablet, Take 1 tablet by mouth daily., Disp: , Rfl:    empagliflozin (JARDIANCE) 25 MG TABS tablet, Take 1 tablet (25 mg total) by mouth daily before breakfast., Disp: 90 tablet, Rfl: 0   etodolac (LODINE) 400 MG tablet, Take 1 tablet (400 mg total) by mouth 2 (two) times daily., Disp: 20 tablet, Rfl:  0   ezetimibe (ZETIA) 10 MG tablet, Take 1 tablet (10 mg total) by mouth daily., Disp: 90 tablet, Rfl: 1   fluticasone (FLONASE) 50 MCG/ACT nasal spray, Place 2 sprays into both nostrils daily., Disp: 16 g, Rfl: 5   gabapentin (NEURONTIN) 100 MG capsule, Take 1-2 capsules (100-200 mg total) by mouth 3 (three) times daily. 100 mg morning and afternoon and two at night, Disp: 120 capsule, Rfl: 0   ipratropium-albuterol (DUONEB) 0.5-2.5 (3) MG/3ML SOLN, Take 3 mLs by nebulization every 4 (four) hours as needed., Disp: 120 mL, Rfl: 0   meclizine (ANTIVERT) 12.5 MG tablet, Take 12.5-25 mg by mouth 3 (three) times daily as needed., Disp: , Rfl:    meloxicam (MOBIC) 7.5 MG tablet, Take 1 tablet (7.5 mg total) by mouth 2 (two) times daily., Disp: 180 tablet, Rfl: 1   montelukast (SINGULAIR) 10 MG tablet, TAKE 1 TABLET BY MOUTH EVERYDAY AT BEDTIME, Disp: 90 tablet, Rfl: 1   pioglitazone (ACTOS) 15 MG tablet, Take 1 tablet (15 mg total) by mouth daily., Disp: 90 tablet, Rfl: 1   pravastatin (PRAVACHOL) 40 MG tablet, Take 1 tablet (40 mg total) by mouth 3 (three) times  a week., Disp: 12 tablet, Rfl: 1   valsartan-hydrochlorothiazide (DIOVAN-HCT) 160-12.5 MG tablet, Take 1 tablet by mouth daily., Disp: 90 tablet, Rfl: 1  Social History   Tobacco Use  Smoking Status Former   Years: 1   Types: Cigarettes   Start date: 12/03/2001   Quit date: 12/28/2002   Years since quitting: 19.5  Smokeless Tobacco Never  Tobacco Comments   smoked 3 to 4 cigarrettes a day for a year    Allergies  Allergen Reactions   Fish Allergy Shortness Of Breath   Penicillins Anaphylaxis and Rash    Childhood allergy   Crestor [Rosuvastatin]     Headache    Levofloxacin Other (See Comments)    Cramping   Adhesive [Tape] Rash    Paper tape ok to use.   Objective:  There were no vitals filed for this visit. There is no height or weight on file to calculate BMI. Constitutional Well developed. Well nourished.  Vascular  Dorsalis pedis pulses palpable bilaterally. Posterior tibial pulses palpable bilaterally. Capillary refill normal to all digits.  No cyanosis or clubbing noted. Pedal hair growth normal.  Neurologic Normal speech. Oriented to person, place, and time. Epicritic sensation to light touch grossly present bilaterally.  Dermatologic Nails well groomed and normal in appearance. No open wounds. No skin lesions.  Orthopedic: Pain on palpation left lateral foot along the course of the peroneal tendon.  No pain at the Achilles tendon posterior tibial tendon ATFL ligament.  Pain with resisted dorsiflexion eversion of the foot   Radiographs: Well-corticated Achilles greater than plantar calcaneal spur. No fracture or dislocation. Preserved joint spaces and bone mineralization. Soft tissue swelling Assessment:   1. Peroneal tendinitis, left    Plan:  Patient was evaluated and treated and all questions answered.  Left peroneal tendinitis -All questions and concerns were discussed with the patient extensive detail given the amount of pain that she is experiencing she will benefit from cam boot immobilization patient agrees with plan like to proceed with cam boot immobilization -Cam boot was dispensed  No follow-ups on file.

## 2022-08-09 ENCOUNTER — Ambulatory Visit: Payer: 59 | Admitting: Podiatry

## 2022-08-14 ENCOUNTER — Ambulatory Visit: Payer: 59 | Admitting: Podiatry

## 2022-08-14 DIAGNOSIS — M7672 Peroneal tendinitis, left leg: Secondary | ICD-10-CM | POA: Diagnosis not present

## 2022-08-14 NOTE — Progress Notes (Signed)
Subjective:  Patient ID: Kelsey Alvarez, female    DOB: Oct 09, 1970,  MRN: [id number]  Chief Complaint  Patient presents with   Foot Pain    Pt stated that her foot is doing better     52 y.o. female presents with the above complaint.  Patient presents for follow-up of left peroneal tendinitis.  She states she is doing better.  Denies any other acute complaints.   Review of Systems: Negative except as noted in the HPI. Denies N/V/F/Ch.  Past Medical History:  Diagnosis Date   Abnormal CBC    Allergy    Arthritis    left knee   Asthma    Cervical polyp    Chronic foot pain    Complication of anesthesia    takes longer to wake up.   Contact dermatitis    Diabetes mellitus without complication (HCC)    Dysmenorrhea    Edema    Female fertility problems    Fibroid uterus    left side, managed by Dr. Jean Rosenthal @ [facility]   GERD (gastroesophageal reflux disease)    Headache    history of migraines   Hyperlipidemia    Hypertension    Lumbago    Neuropathy    Obesity    Occasional tremors     Current Outpatient Medications:    albuterol (VENTOLIN HFA) 108 (90 Base) MCG/ACT inhaler, TAKE 2 PUFFS BY MOUTH EVERY 6 HOURS AS NEEDED FOR WHEEZE OR SHORTNESS OF BREATH, Disp: 8.5 each, Rfl: 0   Alpha Lipoic Acid 200 MG CAPS, Take by mouth., Disp: , Rfl:    beclomethasone (QVAR REDIHALER) 40 MCG/ACT inhaler, Inhale 2 puffs into the lungs 2 (two) times daily., Disp: 1 each, Rfl: 2   Cetirizine HCl (ZYRTEC PO), Take 10 mg by mouth daily., Disp: , Rfl:    Cholecalciferol (VITAMIN D) 2000 UNITS tablet, Take 1 tablet by mouth daily., Disp: , Rfl:    empagliflozin (JARDIANCE) 25 MG TABS tablet, Take 1 tablet (25 mg total) by mouth daily before breakfast., Disp: 90 tablet, Rfl: 0   etodolac (LODINE) 400 MG tablet, Take 1 tablet (400 mg total) by mouth 2 (two) times daily., Disp: 20 tablet, Rfl: 0   ezetimibe (ZETIA) 10 MG tablet, Take 1 tablet (10 mg total) by mouth daily., Disp: 90  tablet, Rfl: 1   fluticasone (FLONASE) 50 MCG/ACT nasal spray, Place 2 sprays into both nostrils daily., Disp: 16 g, Rfl: 5   gabapentin (NEURONTIN) 100 MG capsule, Take 1-2 capsules (100-200 mg total) by mouth 3 (three) times daily. 100 mg morning and afternoon and two at night, Disp: 120 capsule, Rfl: 0   ipratropium-albuterol (DUONEB) 0.5-2.5 (3) MG/3ML SOLN, Take 3 mLs by nebulization every 4 (four) hours as needed., Disp: 120 mL, Rfl: 0   meclizine (ANTIVERT) 12.5 MG tablet, Take 12.5-25 mg by mouth 3 (three) times daily as needed., Disp: , Rfl:    meloxicam (MOBIC) 7.5 MG tablet, Take 1 tablet (7.5 mg total) by mouth 2 (two) times daily., Disp: 180 tablet, Rfl: 1   montelukast (SINGULAIR) 10 MG tablet, TAKE 1 TABLET BY MOUTH EVERYDAY AT BEDTIME, Disp: 90 tablet, Rfl: 1   pioglitazone (ACTOS) 15 MG tablet, Take 1 tablet (15 mg total) by mouth daily., Disp: 90 tablet, Rfl: 1   pravastatin (PRAVACHOL) 40 MG tablet, Take 1 tablet (40 mg total) by mouth 3 (three) times a week., Disp: 12 tablet, Rfl: 1   valsartan-hydrochlorothiazide (DIOVAN-HCT) 160-12.5 MG tablet, Take 1 tablet  by mouth daily., Disp: 90 tablet, Rfl: 1  Social History   Tobacco Use  Smoking Status Former   Years: 1   Types: Cigarettes   Start date: 12/03/2001   Quit date: 12/28/2002   Years since quitting: 19.6  Smokeless Tobacco Never  Tobacco Comments   smoked 3 to 4 cigarrettes a day for a year    Allergies  Allergen Reactions   Fish Allergy Shortness Of Breath   Penicillins Anaphylaxis and Rash    Childhood allergy   Crestor [Rosuvastatin]     Headache    Levofloxacin Other (See Comments)    Cramping   Adhesive [Tape] Rash    Paper tape ok to use.   Objective:  There were no vitals filed for this visit. There is no height or weight on file to calculate BMI. Constitutional Well developed. Well nourished.  Vascular Dorsalis pedis pulses palpable bilaterally. Posterior tibial pulses palpable  bilaterally. Capillary refill normal to all digits.  No cyanosis or clubbing noted. Pedal hair growth normal.  Neurologic Normal speech. Oriented to person, place, and time. Epicritic sensation to light touch grossly present bilaterally.  Dermatologic Nails well groomed and normal in appearance. No open wounds. No skin lesions.  Orthopedic: Pain on palpation left lateral foot along the course of the peroneal tendon.  No pain at the Achilles tendon posterior tibial tendon ATFL ligament.  Pain with resisted dorsiflexion eversion of the foot   Radiographs: Well-corticated Achilles greater than plantar calcaneal spur. No fracture or dislocation. Preserved joint spaces and bone mineralization. Soft tissue swelling Assessment:   No diagnosis found.  Plan:  Patient was evaluated and treated and all questions answered.  Left peroneal tendinitis -All questions and concerns were discussed with the patient extensive detail given the amount of pain that patient can begin transition from cam boot into a Tri-Lock ankle brace.  Given that she still has some residual pain she will benefit from steroid injection.  I discussed the risk of rupture associated with it.  She states understanding like to proceed with injection -A steroid injection was performed at left lateral foot using 1% plain Lidocaine and 10 mg of Kenalog. This was well tolerated.   No follow-ups on file.

## 2022-09-13 ENCOUNTER — Other Ambulatory Visit: Payer: Self-pay | Admitting: Family Medicine

## 2022-09-13 DIAGNOSIS — E1169 Type 2 diabetes mellitus with other specified complication: Secondary | ICD-10-CM

## 2022-09-13 NOTE — Progress Notes (Deleted)
Name: Kelsey Alvarez   MRN: [id number]    DOB: 08-Dec-1970   Date:09/13/2022       Progress Note  Subjective  Chief Complaint  Follow Up  HPI  DM:  hgbA1C was 7.9%, it went up to 8.6 % to 9 % 6.3 % , 6.5 % , 6.2 % , 6.8 % and today is down to 6.5%   She was unable to tolerate Metformin or GLP-1 agonist , she is current on Jardiance  and started on Actos Fall 2022 .She still has neuropathy symptoms ( hands and feet)  she takes gabapentin prn since alpha lipoic acid recommended by neurologist has improved symptoms. . She denies polyphagia but has noticed increase in thirsty lately and also not as compliant with her diet over the holidays. She states pravastatin causes headaches and is only taking it a few times a week , she is also on Zetia   Morbid Obesity:off Ozempic, she stopped because she could not eat anything, she had lost 12 lbs in a short period of time,  we tried switching to Rybelsus but only able to tolerate 3 mg dose, her weight went down 8 lbs but she also stopped due to side effects.  Weight is back up again from 266 lbs to 283.5 lbs   Periumbilical pain: going on for over one year, intermittent, she had laparoscopic surgery before, episodes usually when wearing tight clothes , no redness or increase in warmth Stable    Asthma: she has moderate asthma, she is currently using Symbicort but it is too costly and asked me to switch her to Qvar, explained not equivalent medications and to call back if not controlling symptoms after the change. She continues to have  SOB with strenous activity. She has mild intermittent nocturnal cough but no wheezing    Hyperlipidemia: she was on Atorvastatin but LDL was not at goal, we switched to Crestor but she could not tolerate it , she is now on pravastatin but skips dose because it causes a headache but taking zetia daily   HTN: taking Valsartan hctz  no side effects. No chest pain , SOB is stable.    Low back pain : used to be intermittent across  lower back, but worse since last Summer  when standing it radiates to lateral thighs.We gave her gabapentin and meloxicam, she was seen by Ortho and was told she has arthritis. She is taking Meloxicam twice daily and has helped with symptoms, discussed potential side effects of NSAID's   Left ankle pain and swelling: she had a left knee sprain back in 2017, saw Dr. Logan Bores in 2018 and diagnosed with synovitis of ankle, she states now pain is worse on left lateral foot but also left ankle and has swelling present since Dec and getting worse. 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 10/17/2017   B12 deficiency 04/27/2016   History of endometriosis 11/17/2015  Oligomenorrhea 10/12/2015   Benign essential HTN 12/28/2014   Cervical polyp 12/28/2014   Controlled type 2 diabetes mellitus with microalbuminuria (HCC) 12/28/2014   Dyslipidemia 12/28/2014   Dysmenorrhea 12/28/2014   Edema 12/28/2014   Female infertility 12/28/2014   Gastro-esophageal reflux disease without esophagitis 12/28/2014   Morbid obesity (HCC) 12/28/2014   Asthma, moderate persistent, poorly-controlled 12/28/2014   Perennial allergic rhinitis 12/28/2014   Intermittent tremor 12/28/2014   Vitamin D deficiency 12/28/2014   Intermittent low back pain 12/28/2014   Left sciatic nerve pain 12/28/2014    Past Surgical History:  Procedure Laterality Date    APPENDECTOMY     cervical polyp removal     DILATION AND CURETTAGE OF UTERUS     HYSTEROSCOPY     HYSTEROSCOPY WITH D & C N/A 10/12/2015   Procedure: DILATATION AND CURETTAGE /HYSTEROSCOPY;  Surgeon: Conard Novak, MD;  Location: [facility];  Service: Gynecology;  Laterality: N/A;   LAPAROSCOPIC BILATERAL SALPINGO OOPHERECTOMY N/A 10/12/2015   Procedure: LAPAROSCOPIC RIGHT SALPINGECTOMY, LEFT OOPHORECTOMY;  Surgeon: Conard Novak, MD;  Location: [facility];  Service: Gynecology;  Laterality: N/A;   LAPAROSCOPIC OVARIAN CYSTECTOMY Bilateral 10/12/2015   Procedure: LAPAROSCOPIC OVARIAN CYSTECTOMY;  Surgeon: Conard Novak, MD;  Location: [facility];  Service: Gynecology;  Laterality: Bilateral;   OOPHORECTOMY     OVARIAN CYST REMOVAL      Family History  Problem Relation Age of Onset   Hypertension Mother    Diabetes Mother    CAD Mother    Cancer Mother        cervical   Heart attack Mother 3       Triple Bypass   CAD Father    Hypertension Father    Arthritis Father    Thyroid disease Sister    Alzheimer's disease Maternal Grandmother    Alzheimer's disease Maternal Grandfather    Breast cancer Paternal Grandmother     Social History   Tobacco Use   Smoking status: Former    Current packs/day: 0.00    Types: Cigarettes    Start date: 12/03/2001    Quit date: 12/28/2002    Years since quitting: 19.7   Smokeless tobacco: Never   Tobacco comments:    smoked 3 to 4 cigarrettes a day for a year  Substance Use Topics   Alcohol use: Yes    Alcohol/week: 0.0 standard drinks of alcohol    Comment: occasionally drinks margarita, once or twice a year     Current Outpatient Medications:    albuterol (VENTOLIN HFA) 108 (90 Base) MCG/ACT inhaler, TAKE 2 PUFFS BY MOUTH EVERY 6 HOURS AS NEEDED FOR WHEEZE OR SHORTNESS OF BREATH, Disp: 8.5 each, Rfl: 0   Alpha Lipoic Acid 200 MG CAPS, Take by mouth., Disp: , Rfl:    beclomethasone (QVAR REDIHALER) 40 MCG/ACT inhaler, Inhale 2 puffs  into the lungs 2 (two) times daily., Disp: 1 each, Rfl: 2   Cetirizine HCl (ZYRTEC PO), Take 10 mg by mouth daily., Disp: , Rfl:    Cholecalciferol (VITAMIN D) 2000 UNITS tablet, Take 1 tablet by mouth daily., Disp: , Rfl:    empagliflozin (JARDIANCE) 25 MG TABS tablet, Take 1 tablet (25 mg total) by mouth daily before breakfast., Disp: 90 tablet, Rfl: 0   etodolac (LODINE) 400 MG tablet, Take 1 tablet (400 mg total) by mouth 2 (two) times daily., Disp: 20 tablet, Rfl: 0   ezetimibe (ZETIA) 10 MG tablet, Take 1 tablet (10 mg total) by mouth  daily., Disp: 90 tablet, Rfl: 1   fluticasone (FLONASE) 50 MCG/ACT nasal spray, Place 2 sprays into both nostrils daily., Disp: 16 g, Rfl: 5   gabapentin (NEURONTIN) 100 MG capsule, Take 1-2 capsules (100-200 mg total) by mouth 3 (three) times daily. 100 mg morning and afternoon and two at night, Disp: 120 capsule, Rfl: 0   ipratropium-albuterol (DUONEB) 0.5-2.5 (3) MG/3ML SOLN, Take 3 mLs by nebulization every 4 (four) hours as needed., Disp: 120 mL, Rfl: 0   meclizine (ANTIVERT) 12.5 MG tablet, Take 12.5-25 mg by mouth 3 (three) times daily as needed., Disp: , Rfl:    meloxicam (MOBIC) 7.5 MG tablet, Take 1 tablet (7.5 mg total) by mouth 2 (two) times daily., Disp: 180 tablet, Rfl: 1   montelukast (SINGULAIR) 10 MG tablet, TAKE 1 TABLET BY MOUTH EVERYDAY AT BEDTIME, Disp: 90 tablet, Rfl: 1   pioglitazone (ACTOS) 15 MG tablet, Take 1 tablet (15 mg total) by mouth daily., Disp: 90 tablet, Rfl: 1   pravastatin (PRAVACHOL) 40 MG tablet, Take 1 tablet (40 mg total) by mouth 3 (three) times a week., Disp: 12 tablet, Rfl: 1   valsartan-hydrochlorothiazide (DIOVAN-HCT) 160-12.5 MG tablet, Take 1 tablet by mouth daily., Disp: 90 tablet, Rfl: 1  Allergies  Allergen Reactions   Fish Allergy Shortness Of Breath   Penicillins Anaphylaxis and Rash    Childhood allergy   Crestor [Rosuvastatin]     Headache    Levofloxacin Other (See Comments)    Cramping   Adhesive  [Tape] Rash    Paper tape ok to use.    I personally reviewed active problem list, medication list, allergies, family history, social history, health maintenance with the patient/caregiver today.   ROS  ***  Objective  There were no vitals filed for this visit.  There is no height or weight on file to calculate BMI.  Physical Exam ***  No results found for this or any previous visit (from the past 2160 hour(s)).   PHQ2/9:    06/15/2022    9:14 AM 03/27/2022    8:23 AM 03/09/2022    2:39 PM 11/07/2021    8:20 AM 06/19/2021   11:33 AM  Depression screen PHQ 2/9  Decreased Interest 0 0 0 0 0  Down, Depressed, Hopeless 0 0 0 0 0  PHQ - 2 Score 0 0 0 0 0  Altered sleeping 0 0 3 3 0  Tired, decreased energy 0 0 3 0 0  Change in appetite 0 0 0 0 0  Feeling bad or failure about yourself  0 0 0 0 0  Trouble concentrating 0 0 0 0 0  Moving slowly or fidgety/restless 0 0 0 0 0  Suicidal thoughts 0 0 0 0 0  PHQ-9 Score 0 0 6 3 0    phq 9 is {gen pos [id number]}   Fall Risk:    06/15/2022    9:13 AM 03/27/2022    8:24 AM 03/09/2022    2:39 PM 11/07/2021    8:20 AM 06/19/2021   11:33 AM  Fall Risk   Falls in the past year? 0 0 0 0 0  Number falls in past yr:   0 0 0  Injury with Fall?   0 0 0  Risk for fall due to : No Fall Risks No Fall Risks No Fall Risks No Fall Risks No Fall Risks  Follow up Falls prevention discussed Falls prevention discussed;Education provided;Falls evaluation completed Falls prevention discussed Falls prevention discussed Falls  prevention discussed      Functional Status Survey:      Assessment & Plan  *** There are no diagnoses linked to this encounter.

## 2022-09-14 ENCOUNTER — Ambulatory Visit: Payer: 59 | Admitting: Family Medicine

## 2022-09-20 ENCOUNTER — Other Ambulatory Visit: Payer: Self-pay | Admitting: Family Medicine

## 2022-09-20 DIAGNOSIS — I10 Essential (primary) hypertension: Secondary | ICD-10-CM

## 2022-10-05 ENCOUNTER — Other Ambulatory Visit: Payer: Self-pay | Admitting: Family Medicine

## 2022-10-05 DIAGNOSIS — E1169 Type 2 diabetes mellitus with other specified complication: Secondary | ICD-10-CM

## 2022-10-07 IMAGING — CT CT RENAL STONE PROTOCOL
2 of 4 series · 17 of 46 positions shown, 19 images · non-contrast
Comparison: August 21, 2017

CLINICAL DATA: Lower left-sided abdominal pain.



[Series 2: stone full standard · axial · 0.98mm/px · z∈[-968,-528]mm · 14 of 98 slices shown, 16 images]
[im 5/98  soft-tissue]
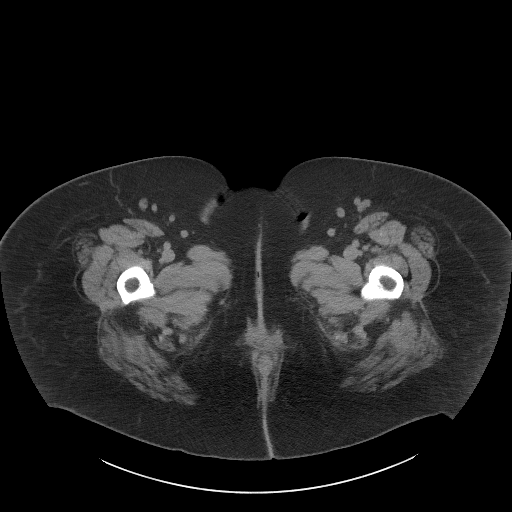
[im 5/98  bone]
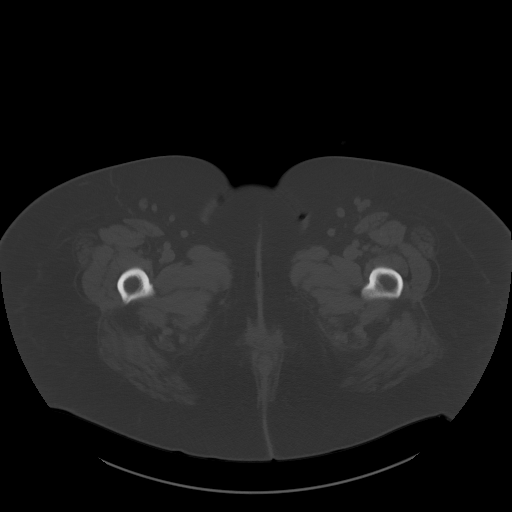
[im 13/98  soft-tissue]
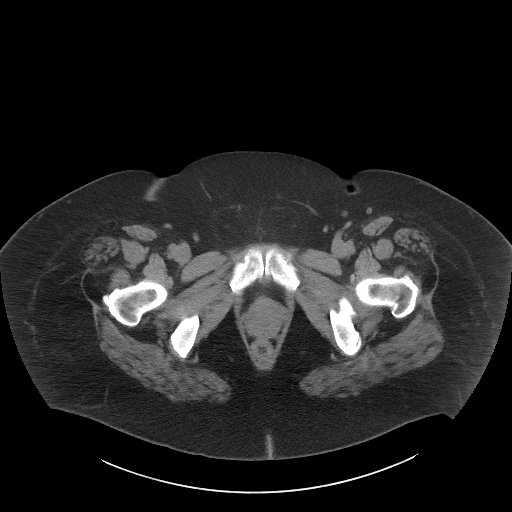
[im 21/98  soft-tissue]
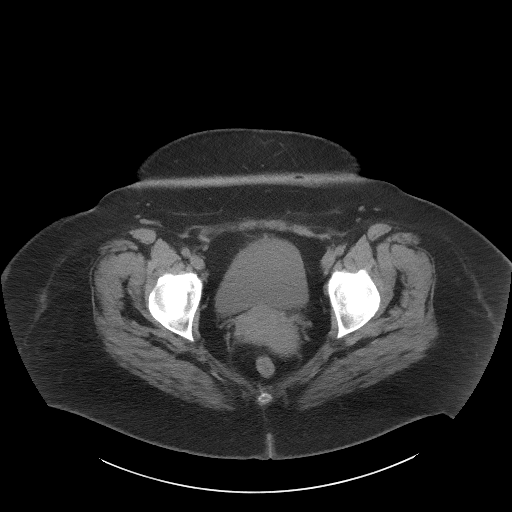
[im 25/98  soft-tissue]
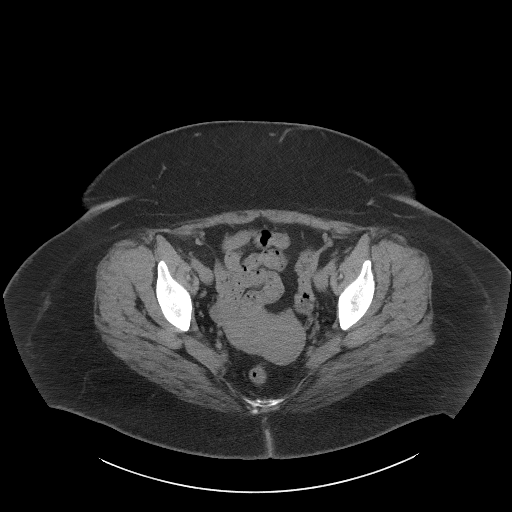
[im 33/98  soft-tissue]
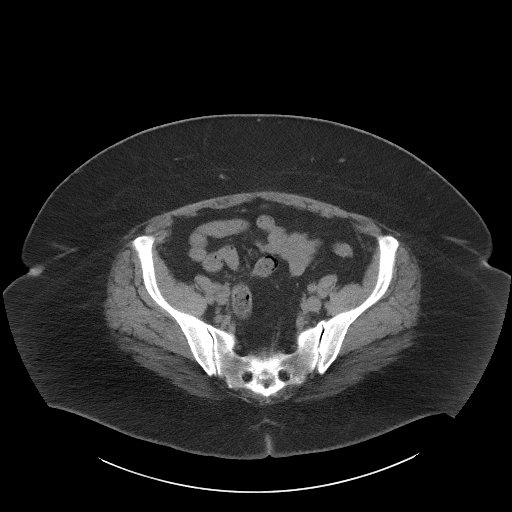
[im 41/98  soft-tissue]
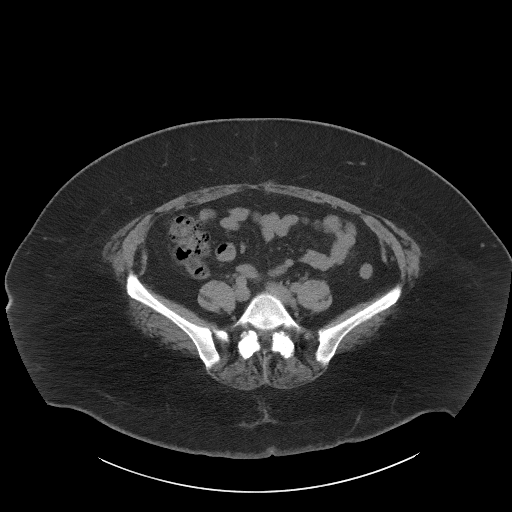
[im 45/98  soft-tissue]
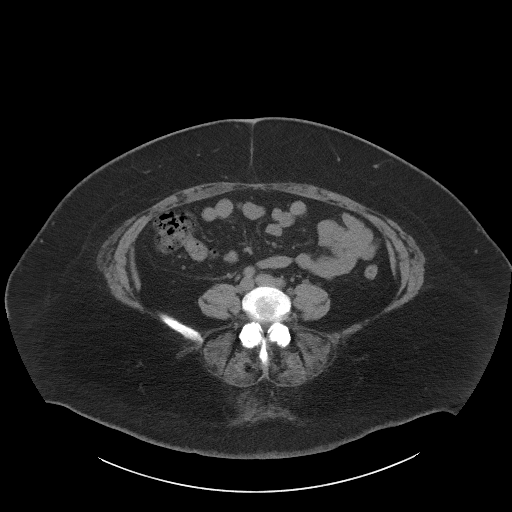
[im 53/98  soft-tissue]
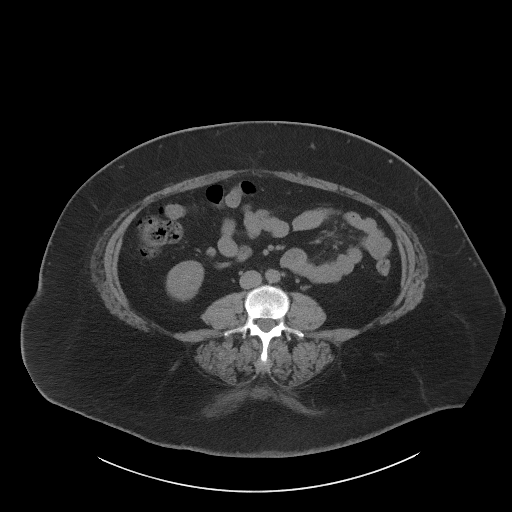
[im 57/98  soft-tissue]
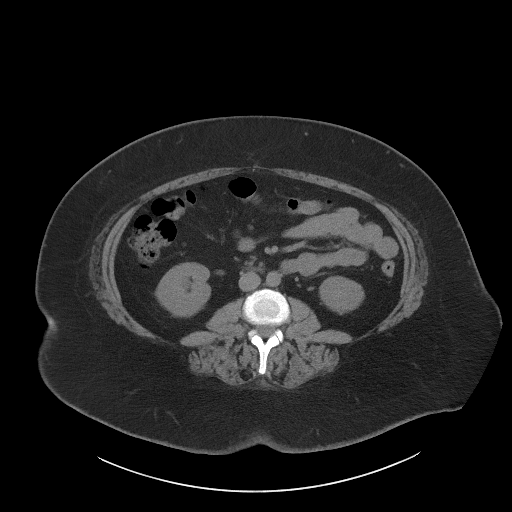
[im 57/98  bone]
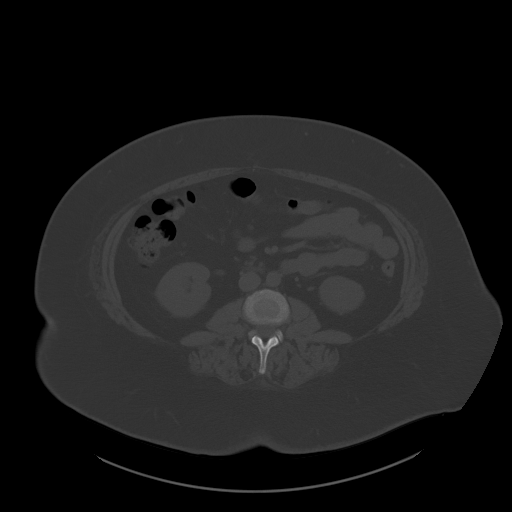
[im 65/98  soft-tissue]
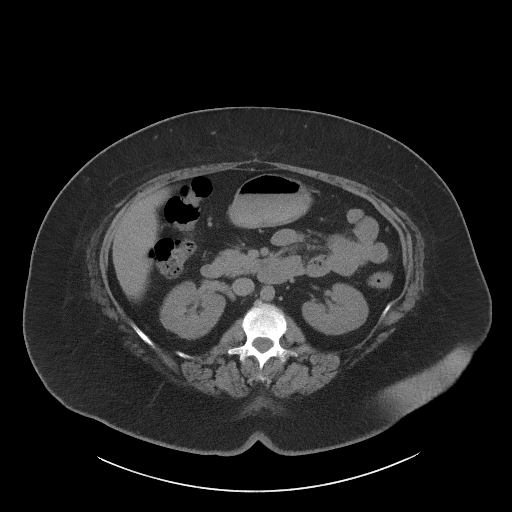
[im 73/98  soft-tissue]
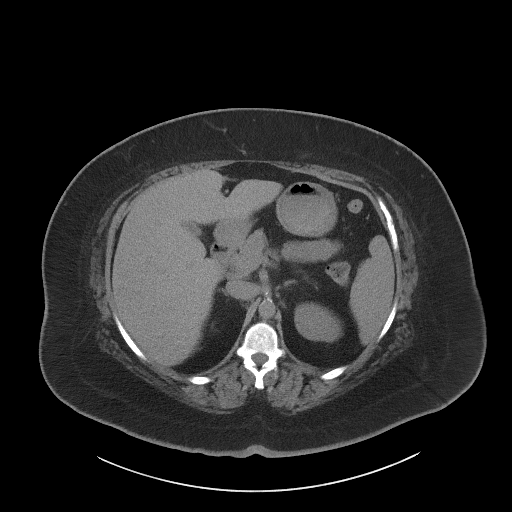
[im 77/98  soft-tissue]
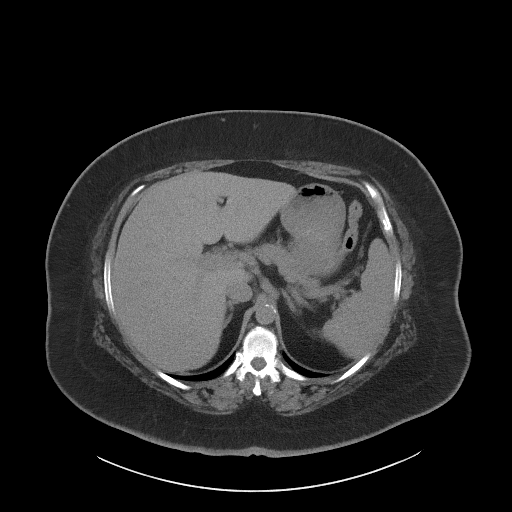
[im 85/98  soft-tissue]
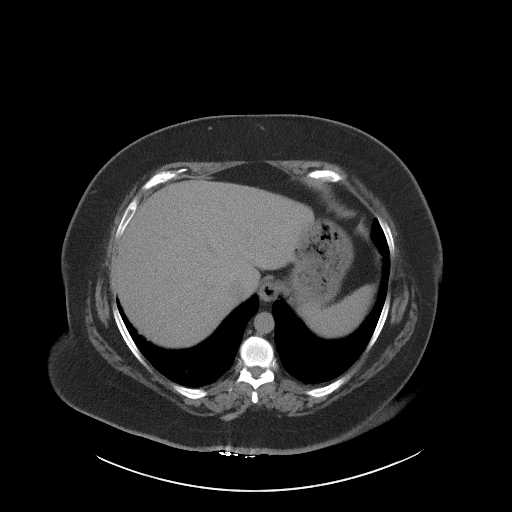
[im 93/98  soft-tissue]
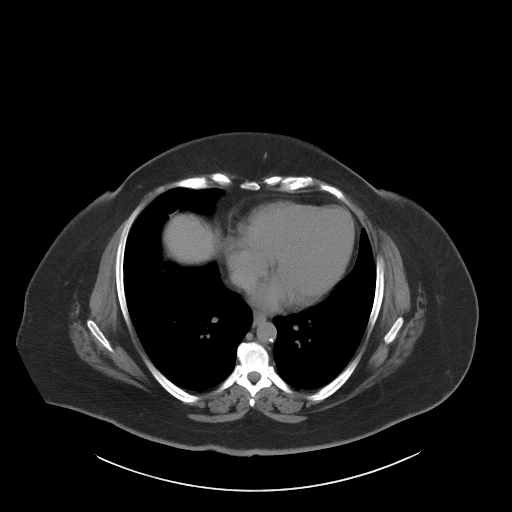

[Series 5: coronal · coronal · 0.95mm/px · 3 of 159 slices shown]
[im 53/159  soft-tissue]
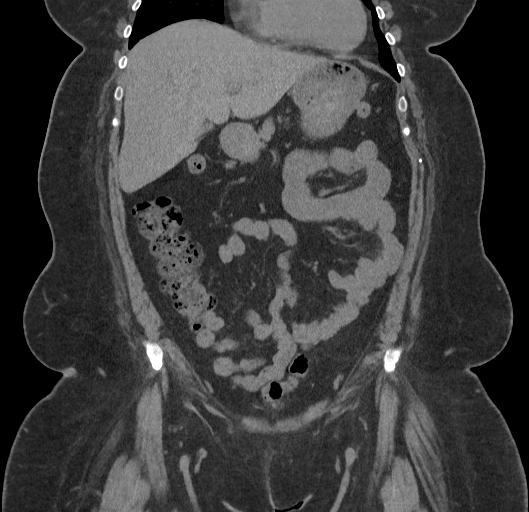
[im 71/159  soft-tissue]
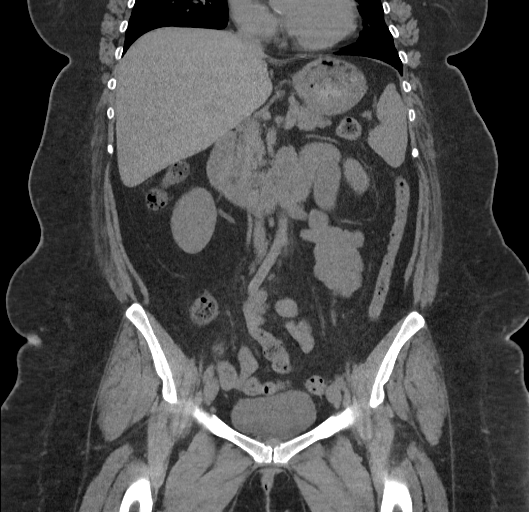
[im 88/159  soft-tissue]
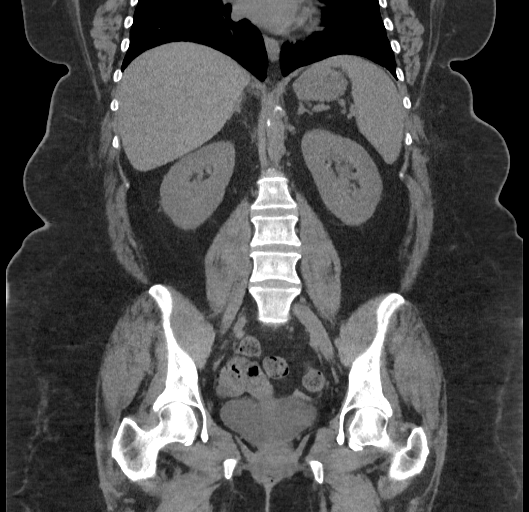

[17 of 46 positions shown; findings below may reference images not displayed]

FINDINGS: Lower chest: No acute abnormality.

Hepatobiliary: No focal liver abnormality is seen. No gallstones,
gallbladder wall thickening, or biliary dilatation.

Pancreas: Unremarkable. No pancreatic ductal dilatation or
surrounding inflammatory changes.

Spleen: Normal in size without focal abnormality.

Adrenals/Urinary Tract: Adrenal glands are unremarkable. Kidneys are
normal, without renal calculi, focal lesion, or hydronephrosis.
Bladder is unremarkable.

Stomach/Bowel: Stomach is within normal limits. The appendix is
surgically absent. No evidence of bowel wall thickening, distention,
or inflammatory changes.

Vascular/Lymphatic: No significant vascular findings are present. No
enlarged abdominal or pelvic lymph nodes.

Reproductive: A 4.7 cm x 3.5 cm heterogeneous uterine fibroid is
noted. The bilateral adnexa are unremarkable

Other: No abdominal wall hernia or abnormality. No abdominopelvic
ascites.

Musculoskeletal: No acute or significant osseous findings.
IMPRESSION: 1. No acute or active process within the abdomen or pelvis.
[DATE] cm x 3.5 cm heterogeneous uterine fibroid.

## 2022-10-12 NOTE — Progress Notes (Deleted)
Name: Kelsey Alvarez   MRN: [id number]    DOB: April 28, 1970   Date:10/12/2022       Progress Note  Subjective  Chief Complaint  Follow Up  HPI  DM:  hgbA1C was 7.9%, it went up to 8.6 % to 9 % 6.3 % , 6.5 % , 6.2 % , 6.8 % and today is down to 6.5%   She was unable to tolerate Metformin or GLP-1 agonist , she is current on Jardiance  and started on Actos Fall 2022 .She still has neuropathy symptoms ( hands and feet)  she takes gabapentin prn since alpha lipoic acid recommended by neurologist has improved symptoms. . She denies polyphagia but has noticed increase in thirsty lately and also not as compliant with her diet over the holidays. She states pravastatin causes headaches and is only taking it a few times a week , she is also on Zetia   Morbid Obesity:off Ozempic, she stopped because she could not eat anything, she had lost 12 lbs in a short period of time,  we tried switching to Rybelsus but only able to tolerate 3 mg dose, her weight went down 8 lbs but she also stopped due to side effects.  Weight is back up again from 266 lbs to 283.5 lbs   Periumbilical pain: going on for over one year, intermittent, she had laparoscopic surgery before, episodes usually when wearing tight clothes , no redness or increase in warmth Stable    Asthma: she has moderate asthma, she is currently using Symbicort but it is too costly and asked me to switch her to Qvar, explained not equivalent medications and to call back if not controlling symptoms after the change. She continues to have  SOB with strenous activity. She has mild intermittent nocturnal cough but no wheezing    Hyperlipidemia: she was on Atorvastatin but LDL was not at goal, we switched to Crestor but she could not tolerate it , she is now on pravastatin but skips dose because it causes a headache but taking zetia daily   HTN: taking Valsartan hctz  no side effects. No chest pain , SOB is stable.    Low back pain : used to be intermittent across  lower back, but worse since last Summer  when standing it radiates to lateral thighs.We gave her gabapentin and meloxicam, she was seen by Ortho and was told she has arthritis. She is taking Meloxicam twice daily and has helped with symptoms, discussed potential side effects of NSAID's   Left ankle pain and swelling: she had a left knee sprain back in 2017, saw Dr. Logan Bores in 2018 and diagnosed with synovitis of ankle, she states now pain is worse on left lateral foot but also left ankle and has swelling present since Dec and getting worse. 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 10/17/2017   B12 deficiency 04/27/2016   History of endometriosis 11/17/2015  Oligomenorrhea 10/12/2015   Benign essential HTN 12/28/2014   Cervical polyp 12/28/2014   Controlled type 2 diabetes mellitus with microalbuminuria (HCC) 12/28/2014   Dyslipidemia 12/28/2014   Dysmenorrhea 12/28/2014   Edema 12/28/2014   Female infertility 12/28/2014   Gastro-esophageal reflux disease without esophagitis 12/28/2014   Morbid obesity (HCC) 12/28/2014   Asthma, moderate persistent, poorly-controlled 12/28/2014   Perennial allergic rhinitis 12/28/2014   Intermittent tremor 12/28/2014   Vitamin D deficiency 12/28/2014   Intermittent low back pain 12/28/2014   Left sciatic nerve pain 12/28/2014    Past Surgical History:  Procedure Laterality Date    APPENDECTOMY     cervical polyp removal     DILATION AND CURETTAGE OF UTERUS     HYSTEROSCOPY     HYSTEROSCOPY WITH D & C N/A 10/12/2015   Procedure: DILATATION AND CURETTAGE /HYSTEROSCOPY;  Surgeon: Conard Novak, MD;  Location: [facility];  Service: Gynecology;  Laterality: N/A;   LAPAROSCOPIC BILATERAL SALPINGO OOPHERECTOMY N/A 10/12/2015   Procedure: LAPAROSCOPIC RIGHT SALPINGECTOMY, LEFT OOPHORECTOMY;  Surgeon: Conard Novak, MD;  Location: [facility];  Service: Gynecology;  Laterality: N/A;   LAPAROSCOPIC OVARIAN CYSTECTOMY Bilateral 10/12/2015   Procedure: LAPAROSCOPIC OVARIAN CYSTECTOMY;  Surgeon: Conard Novak, MD;  Location: [facility];  Service: Gynecology;  Laterality: Bilateral;   OOPHORECTOMY     OVARIAN CYST REMOVAL      Family History  Problem Relation Age of Onset   Hypertension Mother    Diabetes Mother    CAD Mother    Cancer Mother        cervical   Heart attack Mother 38       Triple Bypass   CAD Father    Hypertension Father    Arthritis Father    Thyroid disease Sister    Alzheimer's disease Maternal Grandmother    Alzheimer's disease Maternal Grandfather    Breast cancer Paternal Grandmother     Social History   Tobacco Use   Smoking status: Former    Current packs/day: 0.00    Types: Cigarettes    Start date: 12/03/2001    Quit date: 12/28/2002    Years since quitting: 19.8   Smokeless tobacco: Never   Tobacco comments:    smoked 3 to 4 cigarrettes a day for a year  Substance Use Topics   Alcohol use: Yes    Alcohol/week: 0.0 standard drinks of alcohol    Comment: occasionally drinks margarita, once or twice a year     Current Outpatient Medications:    albuterol (VENTOLIN HFA) 108 (90 Base) MCG/ACT inhaler, TAKE 2 PUFFS BY MOUTH EVERY 6 HOURS AS NEEDED FOR WHEEZE OR SHORTNESS OF BREATH, Disp: 8.5 each, Rfl: 0   Alpha Lipoic Acid 200 MG CAPS, Take by mouth., Disp: , Rfl:    beclomethasone (QVAR REDIHALER) 40 MCG/ACT inhaler, Inhale 2 puffs  into the lungs 2 (two) times daily., Disp: 1 each, Rfl: 2   Cetirizine HCl (ZYRTEC PO), Take 10 mg by mouth daily., Disp: , Rfl:    Cholecalciferol (VITAMIN D) 2000 UNITS tablet, Take 1 tablet by mouth daily., Disp: , Rfl:    etodolac (LODINE) 400 MG tablet, Take 1 tablet (400 mg total) by mouth 2 (two) times daily., Disp: 20 tablet, Rfl: 0   ezetimibe (ZETIA) 10 MG tablet, Take 1 tablet (10 mg total) by mouth daily., Disp: 90 tablet, Rfl: 1   fluticasone (FLONASE) 50 MCG/ACT nasal spray, Place 2 sprays into both nostrils daily., Disp: 16 g,  Rfl: 5   gabapentin (NEURONTIN) 100 MG capsule, Take 1-2 capsules (100-200 mg total) by mouth 3 (three) times daily. 100 mg morning and afternoon and two at night, Disp: 120 capsule, Rfl: 0   ipratropium-albuterol (DUONEB) 0.5-2.5 (3) MG/3ML SOLN, Take 3 mLs by nebulization every 4 (four) hours as needed., Disp: 120 mL, Rfl: 0   JARDIANCE 25 MG TABS tablet, TAKE 1 TABLET BY MOUTH DAILY BEFORE BREAKFAST., Disp: 90 tablet, Rfl: 0   meclizine (ANTIVERT) 12.5 MG tablet, Take 12.5-25 mg by mouth 3 (three) times daily as needed., Disp: , Rfl:    meloxicam (MOBIC) 7.5 MG tablet, Take 1 tablet (7.5 mg total) by mouth 2 (two) times daily., Disp: 180 tablet, Rfl: 1   montelukast (SINGULAIR) 10 MG tablet, TAKE 1 TABLET BY MOUTH EVERYDAY AT BEDTIME, Disp: 90 tablet, Rfl: 1   pioglitazone (ACTOS) 15 MG tablet, TAKE 1 TABLET (15 MG TOTAL) BY MOUTH DAILY., Disp: 30 tablet, Rfl: 0   pravastatin (PRAVACHOL) 40 MG tablet, Take 1 tablet (40 mg total) by mouth 3 (three) times a week., Disp: 12 tablet, Rfl: 1   valsartan-hydrochlorothiazide (DIOVAN-HCT) 160-12.5 MG tablet, TAKE 1 TABLET BY MOUTH EVERY DAY, Disp: 90 tablet, Rfl: 0  Allergies  Allergen Reactions   Fish Allergy Shortness Of Breath   Penicillins Anaphylaxis and Rash    Childhood allergy   Crestor [Rosuvastatin]     Headache    Levofloxacin Other (See Comments)    Cramping   Adhesive [Tape] Rash    Paper tape ok  to use.    I personally reviewed active problem list, medication list, allergies, family history, social history, health maintenance with the patient/caregiver today.   ROS  ***  Objective  There were no vitals filed for this visit.  There is no height or weight on file to calculate BMI.  Physical Exam ***  No results found for this or any previous visit (from the past 2160 hour(s)).  Diabetic Foot Exam: Diabetic Foot Exam - Simple   No data filed    ***  PHQ2/9:    06/15/2022    9:14 AM 03/27/2022    8:23 AM 03/09/2022    2:39 PM 11/07/2021    8:20 AM 06/19/2021   11:33 AM  Depression screen PHQ 2/9  Decreased Interest 0 0 0 0 0  Down, Depressed, Hopeless 0 0 0 0 0  PHQ - 2 Score 0 0 0 0 0  Altered sleeping 0 0 3 3 0  Tired, decreased energy 0 0 3 0 0  Change in appetite 0 0 0 0 0  Feeling bad or failure about yourself  0 0 0 0 0  Trouble concentrating 0 0 0 0 0  Moving slowly or fidgety/restless 0 0 0 0 0  Suicidal thoughts 0 0 0 0 0  PHQ-9 Score 0 0 6 3 0    phq 9 is {gen pos [id number]}   Fall Risk:    06/15/2022    9:13 AM 03/27/2022    8:24 AM 03/09/2022    2:39 PM 11/07/2021    8:20 AM 06/19/2021   11:33 AM  Fall Risk   Falls in the past year? 0 0 0 0 0  Number falls in past yr:   0 0 0  Injury with Fall?   0 0 0  Risk for fall due to : No Fall Risks No Fall Risks No Fall Risks No Fall Risks No Fall Risks  Follow up Falls prevention  discussed Falls prevention discussed;Education provided;Falls evaluation completed Falls prevention discussed Falls prevention discussed Falls prevention discussed      Functional Status Survey:      Assessment & Plan  *** There are no diagnoses linked to this encounter.

## 2022-10-15 ENCOUNTER — Ambulatory Visit: Payer: 59 | Admitting: Family Medicine

## 2022-10-15 DIAGNOSIS — E1169 Type 2 diabetes mellitus with other specified complication: Secondary | ICD-10-CM

## 2022-10-28 ENCOUNTER — Other Ambulatory Visit: Payer: Self-pay | Admitting: Family Medicine

## 2022-10-28 DIAGNOSIS — E1169 Type 2 diabetes mellitus with other specified complication: Secondary | ICD-10-CM

## 2022-10-29 ENCOUNTER — Other Ambulatory Visit: Payer: Self-pay

## 2022-10-29 DIAGNOSIS — E1169 Type 2 diabetes mellitus with other specified complication: Secondary | ICD-10-CM

## 2022-11-12 ENCOUNTER — Other Ambulatory Visit: Payer: Self-pay | Admitting: Family Medicine

## 2022-11-12 DIAGNOSIS — J4540 Moderate persistent asthma, uncomplicated: Secondary | ICD-10-CM

## 2022-11-12 NOTE — Telephone Encounter (Signed)
Pt scheduled appt for 11/20/2022 because she will be completely out of pioglitazone tomorrow. Are you able to refill until her appt?

## 2022-11-19 NOTE — Progress Notes (Deleted)
Name: Kelsey Alvarez   MRN: [id number]    DOB: [birth year]   Date:11/19/2022       Progress Note  Subjective  Chief Complaint  Medication Refill  HPI  DM:  hgbA1C was 7.9%, it went up to 8.6 % to 9 % 6.3 % , 6.5 % , 6.2 % , 6.8 % and today is down to 6.5%   She was unable to tolerate Metformin or GLP-1 agonist , she is current on Jardiance  and started on Actos Fall 2022 .She still has neuropathy symptoms ( hands and feet)  she takes gabapentin prn since alpha lipoic acid recommended by neurologist has improved symptoms. . She denies polyphagia but has noticed increase in thirsty lately and also not as compliant with her diet over the holidays. She states pravastatin causes headaches and is only taking it a few times a week , she is also on Zetia   Morbid Obesity:off Ozempic, she stopped because she could not eat anything, she had lost 12 lbs in a short period of time,  we tried switching to Rybelsus but only able to tolerate 3 mg dose, her weight went down 8 lbs but she also stopped due to side effects.  Weight is back up again from 266 lbs to 283.5 lbs   Periumbilical pain: going on for over one year, intermittent, she had laparoscopic surgery before, episodes usually when wearing tight clothes , no redness or increase in warmth Stable    Asthma: she has moderate asthma, she is currently using Symbicort but it is too costly and asked me to switch her to Qvar, explained not equivalent medications and to call back if not controlling symptoms after the change. She continues to have  SOB with strenous activity. She has mild intermittent nocturnal cough but no wheezing    Hyperlipidemia: she was on Atorvastatin but LDL was not at goal, we switched to Crestor but she could not tolerate it , she is now on pravastatin but skips dose because it causes a headache but taking zetia daily   HTN: taking Valsartan hctz  no side effects. No chest pain , SOB is stable.    Low back pain : used to be  intermittent across lower back, but worse since last Summer  when standing it radiates to lateral thighs.We gave her gabapentin and meloxicam, she was seen by Ortho and was told she has arthritis. She is taking Meloxicam twice daily and has helped with symptoms, discussed potential side effects of NSAID's   Left ankle pain and swelling: she had a left knee sprain back in 2017, saw Dr. Logan Bores in 2018 and diagnosed with synovitis of ankle, she states now pain is worse on left lateral foot but also left ankle and has swelling present since Dec and getting worse. 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 10/17/2017   B12 deficiency 04/27/2016   History of endometriosis 11/17/2015  Oligomenorrhea 10/12/2015   Benign essential HTN 12/28/2014   Cervical polyp 12/28/2014   Controlled type 2 diabetes mellitus with microalbuminuria (HCC) 12/28/2014   Dyslipidemia 12/28/2014   Dysmenorrhea 12/28/2014   Edema 12/28/2014   Female infertility 12/28/2014   Gastro-esophageal reflux disease without esophagitis 12/28/2014   Morbid obesity (HCC) 12/28/2014   Asthma, moderate persistent, poorly-controlled 12/28/2014   Perennial allergic rhinitis 12/28/2014   Intermittent tremor 12/28/2014   Vitamin D deficiency 12/28/2014   Intermittent low back pain 12/28/2014   Left sciatic nerve pain 12/28/2014    Past Surgical History:  Procedure  Laterality Date   APPENDECTOMY     cervical polyp removal     DILATION AND CURETTAGE OF UTERUS     HYSTEROSCOPY     HYSTEROSCOPY WITH D & C N/A 10/12/2015   Procedure: DILATATION AND CURETTAGE /HYSTEROSCOPY;  Surgeon: Conard Novak, MD;  Location: [facility];  Service: Gynecology;  Laterality: N/A;   LAPAROSCOPIC BILATERAL SALPINGO OOPHERECTOMY N/A 10/12/2015   Procedure: LAPAROSCOPIC RIGHT SALPINGECTOMY, LEFT OOPHORECTOMY;  Surgeon: Conard Novak, MD;  Location: [facility];  Service: Gynecology;  Laterality: N/A;   LAPAROSCOPIC OVARIAN CYSTECTOMY Bilateral 10/12/2015   Procedure: LAPAROSCOPIC OVARIAN CYSTECTOMY;  Surgeon: Conard Novak, MD;  Location: [facility];  Service: Gynecology;  Laterality: Bilateral;   OOPHORECTOMY     OVARIAN CYST REMOVAL      Family History  Problem Relation Age of Onset   Hypertension Mother    Diabetes Mother    CAD Mother    Cancer Mother        cervical   Heart attack Mother 68       Triple Bypass   CAD Father    Hypertension Father    Arthritis Father    Thyroid disease Sister    Alzheimer's disease Maternal Grandmother    Alzheimer's disease Maternal Grandfather    Breast cancer Paternal Grandmother     Social History   Tobacco Use   Smoking status: Former    Current packs/day: 0.00    Types: Cigarettes    Start date: 12/03/2001    Quit date: 12/28/2002    Years since quitting: 19.9   Smokeless tobacco: Never   Tobacco comments:    smoked 3 to 4 cigarrettes a day for a year  Substance Use Topics   Alcohol use: Yes    Alcohol/week: 0.0 standard drinks of alcohol    Comment: occasionally drinks margarita, once or twice a year     Current Outpatient Medications:    albuterol (VENTOLIN HFA) 108 (90 Base) MCG/ACT inhaler, TAKE 2 PUFFS BY MOUTH EVERY 6 HOURS AS NEEDED FOR WHEEZE OR SHORTNESS OF BREATH, Disp: 8.5 each, Rfl: 0   Alpha Lipoic Acid 200 MG CAPS, Take by mouth., Disp: , Rfl:    beclomethasone (QVAR REDIHALER) 40 MCG/ACT  inhaler, Inhale 2 puffs into the lungs 2 (two) times daily., Disp: 1 each, Rfl: 2   Cetirizine HCl (ZYRTEC PO), Take 10 mg by mouth daily., Disp: , Rfl:    Cholecalciferol (VITAMIN D) 2000 UNITS tablet, Take 1 tablet by mouth daily., Disp: , Rfl:    etodolac (LODINE) 400 MG tablet, Take 1 tablet (400 mg total) by mouth 2 (two) times daily., Disp: 20 tablet, Rfl: 0   ezetimibe (ZETIA) 10 MG tablet, Take 1 tablet (10 mg total) by mouth daily., Disp: 90 tablet, Rfl: 1   fluticasone (FLONASE) 50 MCG/ACT nasal spray, Place 2 sprays into both nostrils daily., Disp: 16 g,  Rfl: 5   gabapentin (NEURONTIN) 100 MG capsule, Take 1-2 capsules (100-200 mg total) by mouth 3 (three) times daily. 100 mg morning and afternoon and two at night, Disp: 120 capsule, Rfl: 0   ipratropium-albuterol (DUONEB) 0.5-2.5 (3) MG/3ML SOLN, Take 3 mLs by nebulization every 4 (four) hours as needed., Disp: 120 mL, Rfl: 0   JARDIANCE 25 MG TABS tablet, TAKE 1 TABLET BY MOUTH DAILY BEFORE BREAKFAST., Disp: 90 tablet, Rfl: 0   meclizine (ANTIVERT) 12.5 MG tablet, Take 12.5-25 mg by mouth 3 (three) times daily as needed., Disp: , Rfl:    meloxicam (MOBIC) 7.5 MG tablet, Take 1 tablet (7.5 mg total) by mouth 2 (two) times daily., Disp: 180 tablet, Rfl: 1   montelukast (SINGULAIR) 10 MG tablet, TAKE 1 TABLET BY MOUTH EVERYDAY AT BEDTIME, Disp: 90 tablet, Rfl: 1   pioglitazone (ACTOS) 15 MG tablet, TAKE 1 TABLET (15 MG TOTAL) BY MOUTH DAILY., Disp: 30 tablet, Rfl: 0   pravastatin (PRAVACHOL) 40 MG tablet, Take 1 tablet (40 mg total) by mouth 3 (three) times a week., Disp: 12 tablet, Rfl: 1   valsartan-hydrochlorothiazide (DIOVAN-HCT) 160-12.5 MG tablet, TAKE 1 TABLET BY MOUTH EVERY DAY, Disp: 90 tablet, Rfl: 0  Allergies  Allergen Reactions   Fish Allergy Shortness Of Breath   Penicillins Anaphylaxis and Rash    Childhood allergy   Crestor [Rosuvastatin]     Headache    Levofloxacin Other (See Comments)    Cramping   Adhesive  [Tape] Rash    Paper tape ok to use.    I personally reviewed active problem list, medication list, allergies, family history, social history, health maintenance with the patient/caregiver today.   ROS  ***  Objective  There were no vitals filed for this visit.  There is no height or weight on file to calculate BMI.  Physical Exam ***  No results found for this or any previous visit (from the past 2160 hour(s)).   PHQ2/9:    06/15/2022    9:14 AM 03/27/2022    8:23 AM 03/09/2022    2:39 PM 11/07/2021    8:20 AM 06/19/2021   11:33 AM  Depression screen PHQ 2/9  Decreased Interest 0 0 0 0 0  Down, Depressed, Hopeless 0 0 0 0 0  PHQ - 2 Score 0 0 0 0 0  Altered sleeping 0 0 3 3 0  Tired, decreased energy 0 0 3 0 0  Change in appetite 0 0 0 0 0  Feeling bad or failure about yourself  0 0 0 0 0  Trouble concentrating 0 0 0 0 0  Moving slowly or fidgety/restless 0 0 0 0 0  Suicidal thoughts 0 0 0 0 0  PHQ-9 Score 0 0 6 3 0    phq 9 is {gen pos [id number]}   Fall Risk:    06/15/2022    9:13 AM 03/27/2022    8:24 AM 03/09/2022    2:39 PM 11/07/2021    8:20 AM 06/19/2021   11:33 AM  Fall Risk   Falls in the past year? 0 0 0 0 0  Number falls in past yr:   0 0 0  Injury with Fall?   0 0 0  Risk for fall due to : No Fall Risks No Fall Risks No Fall Risks No Fall Risks No Fall Risks  Follow up Falls prevention discussed Falls prevention discussed;Education provided;Falls evaluation completed Falls prevention discussed Falls prevention discussed Falls prevention discussed  Functional Status Survey:      Assessment & Plan  *** There are no diagnoses linked to this encounter.

## 2022-11-19 NOTE — Progress Notes (Unsigned)
Name: Kelsey Alvarez   MRN: [id number]    DOB: 04-Apr-1970   Date:11/20/2022       Progress Note  Subjective  Chief Complaint  Medication Refill  I connected with  Memory Argue  on 11/20/22 at  9:00 AM EDT by a video enabled telemedicine application and verified that I am speaking with the correct person using two identifiers.  I discussed the limitations of evaluation and management by telemedicine and the availability of in person appointments. The patient expressed understanding and agreed to proceed with the virtual visit  Staff also discussed with the patient that there may be a patient responsible charge related to this service. Patient Location: at home  Provider Location: [facility] Additional Individuals present: alone   HPI  DM:  hgbA1C was 7.9%, it went up to 8.6 % to 9 % 6.3 % , 6.5 % , 6.2 % , 6.8 % and today is down to 6.5%   She was unable to tolerate Metformin or GLP-1 agonist , she is current on Jardiance  and started on Actos Fall 2022 .She still has neuropathy symptoms ( hands and feet)  she takes gabapentin prn since alpha lipoic acid recommended by neurologist has improved symptoms. . She denies polyphagia but has noticed increase in thirsty lately and also not as compliant with her diet over the holidays. She states pravastatin causes headaches and is only taking it a few times a week , she is also on Zetia. Glucose at home around 128 occasionally goes up to 150-160's after meals  Morbid Obesity:off Ozempic, she stopped because she could not eat anything, she had lost 12 lbs in a short period of time,  we tried switching to Rybelsus but only able to tolerate 3 mg dose, her weight went down 8 lbs but she also stopped due to side effects.  She states weight at home is down to 272 lbs. That is 11 lbs lighter. She is now drinking sparkling waters, down to one to two cokes a day, eating more protein and cutting down on carbohydrates    Asthma: she has moderate asthma, she had to  stop Symbicort but it is too costly so she has been on Qvar but is not working well for her. It causes her to cough and irritates her throat.  She has noticed increase in SOB since the change and wheezing is now back due to seasonal changes    Hyperlipidemia: she was on Atorvastatin but LDL was not at goal, we switched to Crestor but she could not tolerate it , she is now on pravastatin but skips dose because it causes a headache but taking zetia daily   HTN: taking Valsartan hctz  , she denies  side effects. No chest pain , SOB is stable.    Low back pain : used to be intermittent across lower back, pain is constant but worse in the mornings..We gave her gabapentin and meloxicam, she was seen by Ortho and was told she has arthritis. Advised to take gabapentin every night to see if it will improve the pain also may consider PT  Left ankle pain and swelling: she had a left knee sprain back in 2017, saw Dr. Logan Bores in 2018 and diagnosed with synovitis of ankle,she went back to see him this Spring and had to wear a boot and steroid injection and is doing well now    Patient Active Problem List   Diagnosis Date Noted   Localized osteoarthritis of left knee 03/09/2022  Lumbar back pain with radiculopathy affecting left lower extremity 03/09/2022   Dyslipidemia associated with type 2 diabetes mellitus (HCC) 01/20/2018   Endometriosis 11/06/2017   Menorrhagia with irregular cycle 10/28/2017   Fibroid uterus 10/28/2017   Carpal tunnel syndrome on both sides 10/17/2017   B12 deficiency 04/27/2016   History of endometriosis 11/17/2015   Oligomenorrhea 10/12/2015   Benign essential HTN 12/28/2014   Cervical polyp 12/28/2014   Controlled type 2 diabetes mellitus with microalbuminuria (HCC) 12/28/2014   Dyslipidemia 12/28/2014   Dysmenorrhea 12/28/2014   Edema 12/28/2014   Female infertility 12/28/2014   Gastro-esophageal reflux disease without esophagitis 12/28/2014   Morbid obesity (HCC)  12/28/2014   Asthma, moderate persistent, poorly-controlled 12/28/2014   Perennial allergic rhinitis 12/28/2014   Intermittent tremor 12/28/2014   Vitamin D deficiency 12/28/2014   Intermittent low back pain 12/28/2014   Left sciatic nerve pain 12/28/2014    Past Surgical History:  Procedure Laterality Date   APPENDECTOMY     cervical polyp removal     DILATION AND CURETTAGE OF UTERUS     HYSTEROSCOPY     HYSTEROSCOPY WITH D & C N/A 10/12/2015   Procedure: DILATATION AND CURETTAGE /HYSTEROSCOPY;  Surgeon: Conard Novak, MD;  Location: [facility];  Service: Gynecology;  Laterality: N/A;   LAPAROSCOPIC BILATERAL SALPINGO OOPHERECTOMY N/A 10/12/2015   Procedure: LAPAROSCOPIC RIGHT SALPINGECTOMY, LEFT OOPHORECTOMY;  Surgeon: Conard Novak, MD;  Location: [facility];  Service: Gynecology;  Laterality: N/A;   LAPAROSCOPIC OVARIAN CYSTECTOMY Bilateral 10/12/2015   Procedure: LAPAROSCOPIC OVARIAN CYSTECTOMY;  Surgeon: Conard Novak, MD;  Location: [facility];  Service: Gynecology;  Laterality: Bilateral;   OOPHORECTOMY     OVARIAN CYST REMOVAL      Family History  Problem Relation Age of Onset   Hypertension Mother    Diabetes Mother    CAD Mother    Cancer Mother        cervical   Heart attack Mother 66       Triple Bypass   CAD Father    Hypertension Father    Arthritis Father    Thyroid disease Sister    Alzheimer's disease Maternal Grandmother    Alzheimer's disease Maternal Grandfather    Breast cancer Paternal Grandmother     Social History   Socioeconomic History   Marital status: Married    Spouse name: Danny   Number of children: 0   Years of education: Not on file   Highest education level: Some college, no degree  Occupational History   Occupation: Geologist, engineering  Tobacco Use   Smoking status: Former    Current packs/day: 0.00    Types: Cigarettes    Start date: 12/03/2001    Quit date: 12/28/2002    Years since quitting: 19.9   Smokeless tobacco: Never    Tobacco comments:    smoked 3 to 4 cigarrettes a day for a year  Vaping Use   Vaping status: Never Used  Substance and Sexual Activity   Alcohol use: Yes    Alcohol/week: 0.0 standard drinks of alcohol    Comment: occasionally drinks margarita, once or twice a year   Drug use: No   Sexual activity: Yes    Partners: Male    Comment: Partial Hysterectomy  Other Topics Concern   Not on file  Social History Narrative   Not on file   Social Determinants of Health   Financial Resource Strain: Low Risk  (03/27/2022)   Overall Physicist, medical Strain (  CARDIA)    Difficulty of Paying Living Expenses: Not hard at all  Food Insecurity: No Food Insecurity (03/27/2022)   Hunger Vital Sign    Worried About Running Out of Food in the Last Year: Never true    Ran Out of Food in the Last Year: Never true  Transportation Needs: No Transportation Needs (03/27/2022)   PRAPARE - Administrator, Civil Service (Medical): No    Lack of Transportation (Non-Medical): No  Physical Activity: Insufficiently Active (03/27/2022)   Exercise Vital Sign    Days of Exercise per Week: 1 day    Minutes of Exercise per Session: 10 min  Stress: No Stress Concern Present (03/27/2022)   [employer] of Occupational Health - Occupational Stress Questionnaire    Feeling of Stress : Not at all  Social Connections: Moderately Integrated (03/27/2022)   Social Connection and Isolation Panel [NHANES]    Frequency of Communication with Friends and Family: More than three times a week    Frequency of Social Gatherings with Friends and Family: Once a week    Attends Religious Services: More than 4 times per year    Active Member of [employer] or Organizations: No    Attends Banker Meetings: Never    Marital Status: Married  Catering manager Violence: Not At Risk (03/27/2022)   Humiliation, Afraid, Rape, and Kick questionnaire    Fear of Current or Ex-Partner: No    Emotionally Abused: No     Physically Abused: No    Sexually Abused: No     Current Outpatient Medications:    albuterol (VENTOLIN HFA) 108 (90 Base) MCG/ACT inhaler, TAKE 2 PUFFS BY MOUTH EVERY 6 HOURS AS NEEDED FOR WHEEZE OR SHORTNESS OF BREATH, Disp: 8.5 each, Rfl: 0   Alpha Lipoic Acid 200 MG CAPS, Take by mouth., Disp: , Rfl:    beclomethasone (QVAR REDIHALER) 40 MCG/ACT inhaler, Inhale 2 puffs into the lungs 2 (two) times daily., Disp: 1 each, Rfl: 2   Cetirizine HCl (ZYRTEC PO), Take 10 mg by mouth daily., Disp: , Rfl:    Cholecalciferol (VITAMIN D) 2000 UNITS tablet, Take 1 tablet by mouth daily., Disp: , Rfl:    etodolac (LODINE) 400 MG tablet, Take 1 tablet (400 mg total) by mouth 2 (two) times daily., Disp: 20 tablet, Rfl: 0   ezetimibe (ZETIA) 10 MG tablet, Take 1 tablet (10 mg total) by mouth daily., Disp: 90 tablet, Rfl: 1   fluticasone (FLONASE) 50 MCG/ACT nasal spray, Place 2 sprays into both nostrils daily., Disp: 16 g, Rfl: 5   gabapentin (NEURONTIN) 100 MG capsule, Take 1-2 capsules (100-200 mg total) by mouth 3 (three) times daily. 100 mg morning and afternoon and two at night, Disp: 120 capsule, Rfl: 0   ipratropium-albuterol (DUONEB) 0.5-2.5 (3) MG/3ML SOLN, Take 3 mLs by nebulization every 4 (four) hours as needed., Disp: 120 mL, Rfl: 0   JARDIANCE 25 MG TABS tablet, TAKE 1 TABLET BY MOUTH DAILY BEFORE BREAKFAST., Disp: 90 tablet, Rfl: 0   meclizine (ANTIVERT) 12.5 MG tablet, Take 12.5-25 mg by mouth 3 (three) times daily as needed., Disp: , Rfl:    meloxicam (MOBIC) 7.5 MG tablet, Take 1 tablet (7.5 mg total) by mouth 2 (two) times daily., Disp: 180 tablet, Rfl: 1   montelukast (SINGULAIR) 10 MG tablet, TAKE 1 TABLET BY MOUTH EVERYDAY AT BEDTIME, Disp: 90 tablet, Rfl: 1   pioglitazone (ACTOS) 15 MG tablet, TAKE 1 TABLET (15 MG TOTAL) BY MOUTH DAILY.,  Disp: 30 tablet, Rfl: 0   pravastatin (PRAVACHOL) 40 MG tablet, Take 1 tablet (40 mg total) by mouth 3 (three) times a week., Disp: 12 tablet, Rfl:  1   valsartan-hydrochlorothiazide (DIOVAN-HCT) 160-12.5 MG tablet, TAKE 1 TABLET BY MOUTH EVERY DAY, Disp: 90 tablet, Rfl: 0  Allergies  Allergen Reactions   Fish Allergy Shortness Of Breath   Penicillins Anaphylaxis and Rash    Childhood allergy   Crestor [Rosuvastatin]     Headache    Levofloxacin Other (See Comments)    Cramping   Adhesive [Tape] Rash    Paper tape ok to use.    I personally reviewed active problem list, medication list, allergies, family history, social history, health maintenance with the patient/caregiver today.   ROS  Ten systems reviewed and is negative except as mentioned in HPI    Objective  Virtual encounter, vitals not obtained.  There is no height or weight on file to calculate BMI.  Physical Exam  Awake, alert and oriented  PHQ2/9:    11/20/2022    8:07 AM 06/15/2022    9:14 AM 03/27/2022    8:23 AM 03/09/2022    2:39 PM 11/07/2021    8:20 AM  Depression screen PHQ 2/9  Decreased Interest 0 0 0 0 0  Down, Depressed, Hopeless 0 0 0 0 0  PHQ - 2 Score 0 0 0 0 0  Altered sleeping 2 0 0 3 3  Tired, decreased energy 1 0 0 3 0  Change in appetite 0 0 0 0 0  Feeling bad or failure about yourself  0 0 0 0 0  Trouble concentrating 0 0 0 0 0  Moving slowly or fidgety/restless 0 0 0 0 0  Suicidal thoughts 0 0 0 0 0  PHQ-9 Score 3 0 0 6 3   PHQ-2/9 Result is negative.    Fall Risk:    11/20/2022    8:07 AM 06/15/2022    9:13 AM 03/27/2022    8:24 AM 03/09/2022    2:39 PM 11/07/2021    8:20 AM  Fall Risk   Falls in the past year? 0 0 0 0 0  Number falls in past yr: 0   0 0  Injury with Fall? 0   0 0  Risk for fall due to : No Fall Risks No Fall Risks No Fall Risks No Fall Risks No Fall Risks  Follow up Falls prevention discussed Falls prevention discussed Falls prevention discussed;Education provided;Falls evaluation completed Falls prevention discussed Falls prevention discussed     Assessment & Plan  1. Dyslipidemia associated with type  2 diabetes mellitus (HCC)  - ezetimibe (ZETIA) 10 MG tablet; Take 1 tablet (10 mg total) by mouth daily.  Dispense: 90 tablet; Refill: 0 - empagliflozin (JARDIANCE) 25 MG TABS tablet; Take 1 tablet (25 mg total) by mouth daily before breakfast.  Dispense: 90 tablet; Refill: 0 - pioglitazone (ACTOS) 15 MG tablet; Take 1 tablet (15 mg total) by mouth daily.  Dispense: 90 tablet; Refill: 0 - pravastatin (PRAVACHOL) 40 MG tablet; Take 1 tablet (40 mg total) by mouth 3 (three) times a week.  Dispense: 12 tablet; Refill: 0 - Lipid panel - Hemoglobin A1c  2. B12 deficiency  - B12 and Folate Panel - CBC with Differential/Platelet  3. Vitamin D deficiency  - VITAMIN D 25 Hydroxy (Vit-D Deficiency, Fractures)  4. Benign essential HTN  - valsartan-hydrochlorothiazide (DIOVAN-HCT) 160-12.5 MG tablet; Take 1 tablet by mouth daily.  Dispense: 90  tablet; Refill: 0 - COMPLETE METABOLIC PANEL WITH GFR  5. Lumbar back pain with radiculopathy affecting left lower extremity  - meloxicam (MOBIC) 7.5 MG tablet; Take 1 tablet (7.5 mg total) by mouth 2 (two) times daily.  Dispense: 180 tablet; Refill: 0  6. Localized osteoarthritis of left knee  - meloxicam (MOBIC) 7.5 MG tablet; Take 1 tablet (7.5 mg total) by mouth 2 (two) times daily.  Dispense: 180 tablet; Refill: 0  7. Asthma, moderate persistent, well-controlled  - montelukast (SINGULAIR) 10 MG tablet; TAKE 1 TABLET BY MOUTH EVERYDAY AT BEDTIME  Dispense: 90 tablet; Refill: 0  8. Needs flu shot  - Flu vaccine trivalent PF, 6mos and older(Flulaval,Afluria,Fluarix,Fluzone)  9. Cold intolerance  - TSH   I discussed the assessment and treatment plan with the patient. The patient was provided an opportunity to ask questions and all were answered. The patient agreed with the plan and demonstrated an understanding of the instructions.  The patient was advised to call back or seek an in-person evaluation if the symptoms worsen or if the condition  fails to improve as anticipated.  I provided 25  minutes of non-face-to-face time during this encounter.

## 2022-11-20 ENCOUNTER — Telehealth (INDEPENDENT_AMBULATORY_CARE_PROVIDER_SITE_OTHER): Payer: 59 | Admitting: Family Medicine

## 2022-11-20 ENCOUNTER — Ambulatory Visit: Payer: 59 | Admitting: Family Medicine

## 2022-11-20 DIAGNOSIS — R6889 Other general symptoms and signs: Secondary | ICD-10-CM

## 2022-11-20 DIAGNOSIS — I10 Essential (primary) hypertension: Secondary | ICD-10-CM | POA: Diagnosis not present

## 2022-11-20 DIAGNOSIS — Z23 Encounter for immunization: Secondary | ICD-10-CM | POA: Diagnosis not present

## 2022-11-20 DIAGNOSIS — E559 Vitamin D deficiency, unspecified: Secondary | ICD-10-CM | POA: Diagnosis not present

## 2022-11-20 DIAGNOSIS — J4540 Moderate persistent asthma, uncomplicated: Secondary | ICD-10-CM

## 2022-11-20 DIAGNOSIS — E1169 Type 2 diabetes mellitus with other specified complication: Secondary | ICD-10-CM | POA: Diagnosis not present

## 2022-11-20 DIAGNOSIS — Z7984 Long term (current) use of oral hypoglycemic drugs: Secondary | ICD-10-CM

## 2022-11-20 DIAGNOSIS — E785 Hyperlipidemia, unspecified: Secondary | ICD-10-CM

## 2022-11-20 DIAGNOSIS — M1712 Unilateral primary osteoarthritis, left knee: Secondary | ICD-10-CM

## 2022-11-20 DIAGNOSIS — E538 Deficiency of other specified B group vitamins: Secondary | ICD-10-CM | POA: Diagnosis not present

## 2022-11-20 DIAGNOSIS — M5416 Radiculopathy, lumbar region: Secondary | ICD-10-CM

## 2022-11-20 MED ORDER — MELOXICAM 7.5 MG PO TABS
7.5000 mg | ORAL_TABLET | Freq: Two times a day (BID) | ORAL | 0 refills | Status: DC
Start: 2022-11-20 — End: 2023-11-29

## 2022-11-20 MED ORDER — EZETIMIBE 10 MG PO TABS
10.0000 mg | ORAL_TABLET | Freq: Every day | ORAL | 0 refills | Status: DC
Start: 2022-11-20 — End: 2023-02-22

## 2022-11-20 MED ORDER — PIOGLITAZONE HCL 15 MG PO TABS
15.0000 mg | ORAL_TABLET | Freq: Every day | ORAL | 0 refills | Status: DC
Start: 2022-11-20 — End: 2023-02-18

## 2022-11-20 MED ORDER — VALSARTAN-HYDROCHLOROTHIAZIDE 160-12.5 MG PO TABS
1.0000 | ORAL_TABLET | Freq: Every day | ORAL | 0 refills | Status: DC
Start: 2022-11-20 — End: 2023-02-22

## 2022-11-20 MED ORDER — EMPAGLIFLOZIN 25 MG PO TABS
25.0000 mg | ORAL_TABLET | Freq: Every day | ORAL | 0 refills | Status: DC
Start: 2022-11-20 — End: 2023-02-22

## 2022-11-20 MED ORDER — PRAVASTATIN SODIUM 40 MG PO TABS
40.0000 mg | ORAL_TABLET | ORAL | 0 refills | Status: DC
Start: 2022-11-21 — End: 2022-12-11

## 2022-11-20 MED ORDER — FLUTICASONE FUROATE-VILANTEROL 100-25 MCG/ACT IN AEPB: 1.0000 | INHALATION_SPRAY | Freq: Every day | RESPIRATORY_TRACT | 0 refills | Status: DC

## 2022-11-20 MED ORDER — MONTELUKAST SODIUM 10 MG PO TABS
ORAL_TABLET | ORAL | 0 refills | Status: DC
Start: 2022-11-20 — End: 2023-02-18

## 2022-11-21 LAB — CBC WITH DIFFERENTIAL/PLATELET
Absolute Monocytes: 561 {cells}/uL (ref 200–950)
Basophils Absolute: 80 {cells}/uL (ref 0–200)
Basophils Relative: 0.9 %
Eosinophils Absolute: 107 {cells}/uL (ref 15–500)
Eosinophils Relative: 1.2 %
HCT: 45.6 % — ABNORMAL HIGH (ref 35.0–45.0)
Hemoglobin: 15.6 g/dL — ABNORMAL HIGH (ref 11.7–15.5)
Lymphs Abs: 2697 {cells}/uL (ref 850–3900)
MCH: 27.1 pg (ref 27.0–33.0)
MCHC: 34.2 g/dL (ref 32.0–36.0)
MCV: 79.3 fL — ABNORMAL LOW (ref 80.0–100.0)
MPV: 10.1 fL (ref 7.5–12.5)
Monocytes Relative: 6.3 %
Neutro Abs: 5456 {cells}/uL (ref 1500–7800)
Neutrophils Relative %: 61.3 %
Platelets: 268 10*3/uL (ref 140–400)
RBC: 5.75 10*6/uL — ABNORMAL HIGH (ref 3.80–5.10)
RDW: 14.1 % (ref 11.0–15.0)
Total Lymphocyte: 30.3 %
WBC: 8.9 10*3/uL (ref 3.8–10.8)

## 2022-11-27 LAB — HM DIABETES EYE EXAM

## 2022-12-11 ENCOUNTER — Other Ambulatory Visit: Payer: Self-pay | Admitting: Family Medicine

## 2022-12-11 DIAGNOSIS — E1169 Type 2 diabetes mellitus with other specified complication: Secondary | ICD-10-CM

## 2022-12-19 ENCOUNTER — Other Ambulatory Visit: Payer: Self-pay | Admitting: Family Medicine

## 2022-12-19 DIAGNOSIS — J4540 Moderate persistent asthma, uncomplicated: Secondary | ICD-10-CM

## 2022-12-20 ENCOUNTER — Other Ambulatory Visit: Payer: Self-pay | Admitting: Family Medicine

## 2022-12-20 DIAGNOSIS — J4540 Moderate persistent asthma, uncomplicated: Secondary | ICD-10-CM

## 2022-12-21 ENCOUNTER — Other Ambulatory Visit: Payer: Self-pay | Admitting: Family Medicine

## 2022-12-21 DIAGNOSIS — J4540 Moderate persistent asthma, uncomplicated: Secondary | ICD-10-CM

## 2022-12-21 MED ORDER — ALBUTEROL SULFATE HFA 108 (90 BASE) MCG/ACT IN AERS
2.0000 | INHALATION_SPRAY | RESPIRATORY_TRACT | 0 refills | Status: DC | PRN
Start: 2022-12-21 — End: 2022-12-21

## 2022-12-25 NOTE — Telephone Encounter (Signed)
done

## 2023-01-02 NOTE — Progress Notes (Deleted)
Name: Kelsey Alvarez   MRN: [id number]    DOB: 1970-03-10   Date:01/02/2023       Progress Note  Subjective  Chief Complaint  Medication Refill  HPI  DM:  hgbA1C was 7.9%, it went up to 8.6 % to 9 % 6.3 % , 6.5 % , 6.2 % , 6.8 % and today is down to 6.5%   She was unable to tolerate Metformin or GLP-1 agonist , she is current on Jardiance  and started on Actos Fall 2022 .She still has neuropathy symptoms ( hands and feet)  she takes gabapentin prn since alpha lipoic acid recommended by neurologist has improved symptoms. . She denies polyphagia but has noticed increase in thirsty lately and also not as compliant with her diet over the holidays. She states pravastatin causes headaches and is only taking it a few times a week , she is also on Zetia. Glucose at home around 128 occasionally goes up to 150-160's after meals  Morbid Obesity:off Ozempic, she stopped because she could not eat anything, she had lost 12 lbs in a short period of time,  we tried switching to Rybelsus but only able to tolerate 3 mg dose, her weight went down 8 lbs but she also stopped due to side effects.  She states weight at home is down to 272 lbs. That is 11 lbs lighter. She is now drinking sparkling waters, down to one to two cokes a day, eating more protein and cutting down on carbohydrates    Asthma: she has moderate asthma, she had to stop Symbicort but it is too costly so she has been on Qvar but is not working well for her. It causes her to cough and irritates her throat.  She has noticed increase in SOB since the change and wheezing is now back due to seasonal changes    Hyperlipidemia: she was on Atorvastatin but LDL was not at goal, we switched to Crestor but she could not tolerate it , she is now on pravastatin but skips dose because it causes a headache but taking zetia daily   HTN: taking Valsartan hctz  , she denies  side effects. No chest pain , SOB is stable.    Low back pain : used to be intermittent  across lower back, pain is constant but worse in the mornings..We gave her gabapentin and meloxicam, she was seen by Ortho and was told she has arthritis. Advised to take gabapentin every night to see if it will improve the pain also may consider PT  Left ankle pain and swelling: she had a left knee sprain back in 2017, saw Dr. Logan Bores in 2018 and diagnosed with synovitis of ankle,she went back to see him this Spring and had to wear a boot and steroid injection and is doing well now   Patient Active Problem List   Diagnosis Date Noted   Localized osteoarthritis of left knee 03/09/2022   Lumbar back pain with radiculopathy affecting left lower extremity 03/09/2022   Dyslipidemia associated with type 2 diabetes mellitus (HCC) 01/20/2018   Endometriosis 11/06/2017   Menorrhagia with irregular cycle 10/28/2017   Fibroid uterus 10/28/2017   Carpal tunnel syndrome on both sides 10/17/2017   B12 deficiency 04/27/2016   History of endometriosis 11/17/2015   Oligomenorrhea 10/12/2015   Benign essential HTN 12/28/2014   Cervical polyp 12/28/2014   Controlled type 2 diabetes mellitus with microalbuminuria (HCC) 12/28/2014   Dyslipidemia 12/28/2014   Dysmenorrhea 12/28/2014   Edema 12/28/2014  Female infertility 12/28/2014   Gastro-esophageal reflux disease without esophagitis 12/28/2014   Morbid obesity (HCC) 12/28/2014   Asthma, moderate persistent, poorly-controlled 12/28/2014   Perennial allergic rhinitis 12/28/2014   Intermittent tremor 12/28/2014   Vitamin D deficiency 12/28/2014   Intermittent low back pain 12/28/2014   Left sciatic nerve pain 12/28/2014    Past Surgical History:  Procedure Laterality Date   APPENDECTOMY     cervical polyp removal     DILATION AND CURETTAGE OF UTERUS     HYSTEROSCOPY     HYSTEROSCOPY WITH D & C N/A 10/12/2015   Procedure: DILATATION AND CURETTAGE /HYSTEROSCOPY;  Surgeon: Conard Novak, MD;  Location: [facility];  Service: Gynecology;  Laterality:  N/A;   LAPAROSCOPIC BILATERAL SALPINGO OOPHERECTOMY N/A 10/12/2015   Procedure: LAPAROSCOPIC RIGHT SALPINGECTOMY, LEFT OOPHORECTOMY;  Surgeon: Conard Novak, MD;  Location: [facility];  Service: Gynecology;  Laterality: N/A;   LAPAROSCOPIC OVARIAN CYSTECTOMY Bilateral 10/12/2015   Procedure: LAPAROSCOPIC OVARIAN CYSTECTOMY;  Surgeon: Conard Novak, MD;  Location: [facility];  Service: Gynecology;  Laterality: Bilateral;   OOPHORECTOMY     OVARIAN CYST REMOVAL      Family History  Problem Relation Age of Onset   Hypertension Mother    Diabetes Mother    CAD Mother    Cancer Mother        cervical   Heart attack Mother 17       Triple Bypass   CAD Father    Hypertension Father    Arthritis Father    Thyroid disease Sister    Alzheimer's disease Maternal Grandmother    Alzheimer's disease Maternal Grandfather    Breast cancer Paternal Grandmother     Social History   Tobacco Use   Smoking status: Former    Current packs/day: 0.00    Types: Cigarettes    Start date: 12/03/2001    Quit date: 12/28/2002    Years since quitting: 20.0   Smokeless tobacco: Never   Tobacco comments:    smoked 3 to 4 cigarrettes a day for a year  Substance Use Topics   Alcohol use: Yes    Alcohol/week: 0.0 standard drinks of alcohol    Comment: occasionally drinks margarita, once or twice a year     Current Outpatient Medications:    albuterol (VENTOLIN HFA) 108 (90 Base) MCG/ACT inhaler, INHALE 2 PUFFS INTO THE LUNGS EVERY 4 HOURS AS NEEDED FOR WHEEZING OR SHORTNESS OF BREATH., Disp: 18 each, Rfl: 0   Alpha Lipoic Acid 200 MG CAPS, Take by mouth., Disp: , Rfl:    Cetirizine HCl (ZYRTEC PO), Take 10 mg by mouth daily., Disp: , Rfl:    Cholecalciferol (VITAMIN D) 2000 UNITS tablet, Take 1 tablet by mouth daily., Disp: , Rfl:    empagliflozin (JARDIANCE) 25 MG TABS tablet, Take 1 tablet (25 mg total) by mouth daily before breakfast., Disp: 90 tablet, Rfl: 0   ezetimibe (ZETIA) 10 MG tablet, Take  1 tablet (10 mg total) by mouth daily., Disp: 90 tablet, Rfl: 0   fluticasone (FLONASE) 50 MCG/ACT nasal spray, Place 2 sprays into both nostrils daily., Disp: 16 g, Rfl: 5   fluticasone furoate-vilanterol (BREO ELLIPTA) 100-25 MCG/ACT AEPB, Inhale 1 puff into the lungs daily., Disp: 180 each, Rfl: 0   gabapentin (NEURONTIN) 100 MG capsule, Take 1-2 capsules (100-200 mg total) by mouth 3 (three) times daily. 100 mg morning and afternoon and two at night, Disp: 120 capsule, Rfl: 0   ipratropium-albuterol (DUONEB)  0.5-2.5 (3) MG/3ML SOLN, Take 3 mLs by nebulization every 4 (four) hours as needed., Disp: 120 mL, Rfl: 0   meclizine (ANTIVERT) 12.5 MG tablet, Take 12.5-25 mg by mouth 3 (three) times daily as needed., Disp: , Rfl:    meloxicam (MOBIC) 7.5 MG tablet, Take 1 tablet (7.5 mg total) by mouth 2 (two) times daily., Disp: 180 tablet, Rfl: 0   montelukast (SINGULAIR) 10 MG tablet, TAKE 1 TABLET BY MOUTH EVERYDAY AT BEDTIME, Disp: 90 tablet, Rfl: 0   pioglitazone (ACTOS) 15 MG tablet, Take 1 tablet (15 mg total) by mouth daily., Disp: 90 tablet, Rfl: 0   pravastatin (PRAVACHOL) 40 MG tablet, TAKE 1 TABLET (40 MG TOTAL) BY MOUTH 3 (THREE) TIMES A WEEK., Disp: 36 tablet, Rfl: 0   valsartan-hydrochlorothiazide (DIOVAN-HCT) 160-12.5 MG tablet, Take 1 tablet by mouth daily., Disp: 90 tablet, Rfl: 0  Allergies  Allergen Reactions   Fish Allergy Shortness Of Breath   Penicillins Anaphylaxis and Rash    Childhood allergy   Crestor [Rosuvastatin]     Headache    Levofloxacin Other (See Comments)    Cramping   Adhesive [Tape] Rash    Paper tape ok to use.    I personally reviewed active problem list, medication list, allergies, family history, social history, health maintenance with the patient/caregiver today.   ROS  ***  Objective  There were no vitals filed for this visit.  There is no height or weight on file to calculate BMI.  Physical Exam ***   PHQ2/9:    11/20/2022    8:07  AM 06/15/2022    9:14 AM 03/27/2022    8:23 AM 03/09/2022    2:39 PM 11/07/2021    8:20 AM  Depression screen PHQ 2/9  Decreased Interest 0 0 0 0 0  Down, Depressed, Hopeless 0 0 0 0 0  PHQ - 2 Score 0 0 0 0 0  Altered sleeping 2 0 0 3 3  Tired, decreased energy 1 0 0 3 0  Change in appetite 0 0 0 0 0  Feeling bad or failure about yourself  0 0 0 0 0  Trouble concentrating 0 0 0 0 0  Moving slowly or fidgety/restless 0 0 0 0 0  Suicidal thoughts 0 0 0 0 0  PHQ-9 Score 3 0 0 6 3    phq 9 is {gen pos [id number]}   Fall Risk:    11/20/2022    8:07 AM 06/15/2022    9:13 AM 03/27/2022    8:24 AM 03/09/2022    2:39 PM 11/07/2021    8:20 AM  Fall Risk   Falls in the past year? 0 0 0 0 0  Number falls in past yr: 0   0 0  Injury with Fall? 0   0 0  Risk for fall due to : No Fall Risks No Fall Risks No Fall Risks No Fall Risks No Fall Risks  Follow up Falls prevention discussed Falls prevention discussed Falls prevention discussed;Education provided;Falls evaluation completed Falls prevention discussed Falls prevention discussed      Functional Status Survey:      Assessment & Plan  *** There are no diagnoses linked to this encounter.

## 2023-01-03 ENCOUNTER — Ambulatory Visit: Payer: 59 | Admitting: Family Medicine

## 2023-01-16 ENCOUNTER — Encounter: Payer: Self-pay | Admitting: Physician Assistant

## 2023-01-16 ENCOUNTER — Telehealth: Payer: 59 | Admitting: Physician Assistant

## 2023-01-16 DIAGNOSIS — B9689 Other specified bacterial agents as the cause of diseases classified elsewhere: Secondary | ICD-10-CM | POA: Diagnosis not present

## 2023-01-16 DIAGNOSIS — J019 Acute sinusitis, unspecified: Secondary | ICD-10-CM

## 2023-01-16 MED ORDER — DOXYCYCLINE HYCLATE 100 MG PO TABS: 100.0000 mg | ORAL_TABLET | Freq: Two times a day (BID) | ORAL | 0 refills | Status: DC

## 2023-01-16 NOTE — Patient Instructions (Signed)
Memory Argue, thank you for joining [name], PA-C for today's virtual visit.  While this provider is not your primary care provider (PCP), if your PCP is located in our provider database this encounter information will be shared with them immediately following your visit.   A [facility] MyChart account gives you access to today's visit and all your visits, tests, and labs performed at [facility] " click here if you don't have a [facility] MyChart account or go to [email or web address]  Consent: (Patient) Kelsey Alvarez provided verbal consent for this virtual visit at the beginning of the encounter.  Current Medications:  Current Outpatient Medications:    albuterol (VENTOLIN HFA) 108 (90 Base) MCG/ACT inhaler, INHALE 2 PUFFS INTO THE LUNGS EVERY 4 HOURS AS NEEDED FOR WHEEZING OR SHORTNESS OF BREATH., Disp: 18 each, Rfl: 0   Alpha Lipoic Acid 200 MG CAPS, Take by mouth., Disp: , Rfl:    Cetirizine HCl (ZYRTEC PO), Take 10 mg by mouth daily., Disp: , Rfl:    Cholecalciferol (VITAMIN D) 2000 UNITS tablet, Take 1 tablet by mouth daily., Disp: , Rfl:    empagliflozin (JARDIANCE) 25 MG TABS tablet, Take 1 tablet (25 mg total) by mouth daily before breakfast., Disp: 90 tablet, Rfl: 0   ezetimibe (ZETIA) 10 MG tablet, Take 1 tablet (10 mg total) by mouth daily., Disp: 90 tablet, Rfl: 0   fluticasone (FLONASE) 50 MCG/ACT nasal spray, Place 2 sprays into both nostrils daily., Disp: 16 g, Rfl: 5   fluticasone furoate-vilanterol (BREO ELLIPTA) 100-25 MCG/ACT AEPB, Inhale 1 puff into the lungs daily., Disp: 180 each, Rfl: 0   gabapentin (NEURONTIN) 100 MG capsule, Take 1-2 capsules (100-200 mg total) by mouth 3 (three) times daily. 100 mg morning and afternoon and two at night, Disp: 120 capsule, Rfl: 0   ipratropium-albuterol (DUONEB) 0.5-2.5 (3) MG/3ML SOLN, Take 3 mLs by nebulization every 4 (four) hours as needed., Disp: 120 mL, Rfl: 0   meclizine (ANTIVERT) 12.5 MG  tablet, Take 12.5-25 mg by mouth 3 (three) times daily as needed., Disp: , Rfl:    meloxicam (MOBIC) 7.5 MG tablet, Take 1 tablet (7.5 mg total) by mouth 2 (two) times daily., Disp: 180 tablet, Rfl: 0   montelukast (SINGULAIR) 10 MG tablet, TAKE 1 TABLET BY MOUTH EVERYDAY AT BEDTIME, Disp: 90 tablet, Rfl: 0   pioglitazone (ACTOS) 15 MG tablet, Take 1 tablet (15 mg total) by mouth daily., Disp: 90 tablet, Rfl: 0   pravastatin (PRAVACHOL) 40 MG tablet, TAKE 1 TABLET (40 MG TOTAL) BY MOUTH 3 (THREE) TIMES A WEEK., Disp: 36 tablet, Rfl: 0   valsartan-hydrochlorothiazide (DIOVAN-HCT) 160-12.5 MG tablet, Take 1 tablet by mouth daily., Disp: 90 tablet, Rfl: 0   Medications ordered in this encounter:  No orders of the defined types were placed in this encounter.    *If you need refills on other medications prior to your next appointment, please contact your pharmacy*  Follow-Up: Call back or seek an in-person evaluation if the symptoms worsen or if the condition fails to improve as anticipated.  [facility] [phone]  Other Instructions Please take antibiotic as directed.  Increase fluid intake.  Use Saline nasal spray.  Take a daily multivitamin. Continue Flonase OTC, along with Mucinex.  Place a humidifier in the bedroom.  Please call or return clinic if symptoms are not improving.  Sinusitis Sinusitis is redness, soreness, and swelling (inflammation) of the paranasal sinuses. Paranasal sinuses are air pockets within the  bones of your face (beneath the eyes, the middle of the forehead, or above the eyes). In healthy paranasal sinuses, mucus is able to drain out, and air is able to circulate through them by way of your nose. However, when your paranasal sinuses are inflamed, mucus and air can become trapped. This can allow bacteria and other germs to grow and cause infection. Sinusitis can develop quickly and last only a short time (acute) or continue over a long period (chronic).  Sinusitis that lasts for more than 12 weeks is considered chronic.  CAUSES  Causes of sinusitis include: Allergies. Structural abnormalities, such as displacement of the cartilage that separates your nostrils (deviated septum), which can decrease the air flow through your nose and sinuses and affect sinus drainage. Functional abnormalities, such as when the small hairs (cilia) that line your sinuses and help remove mucus do not work properly or are not present. SYMPTOMS  Symptoms of acute and chronic sinusitis are the same. The primary symptoms are pain and pressure around the affected sinuses. Other symptoms include: Upper toothache. Earache. Headache. Bad breath. Decreased sense of smell and taste. A cough, which worsens when you are lying flat. Fatigue. Fever. Thick drainage from your nose, which often is green and may contain pus (purulent). Swelling and warmth over the affected sinuses. DIAGNOSIS  Your caregiver will perform a physical exam. During the exam, your caregiver may: Look in your nose for signs of abnormal growths in your nostrils (nasal polyps). Tap over the affected sinus to check for signs of infection. View the inside of your sinuses (endoscopy) with a special imaging device with a light attached (endoscope), which is inserted into your sinuses. If your caregiver suspects that you have chronic sinusitis, one or more of the following tests may be recommended: Allergy tests. Nasal culture A sample of mucus is taken from your nose and sent to a lab and screened for bacteria. Nasal cytology A sample of mucus is taken from your nose and examined by your caregiver to determine if your sinusitis is related to an allergy. TREATMENT  Most cases of acute sinusitis are related to a viral infection and will resolve on their own within 10 days. Sometimes medicines are prescribed to help relieve symptoms (pain medicine, decongestants, nasal steroid sprays, or saline sprays).   However, for sinusitis related to a bacterial infection, your caregiver will prescribe antibiotic medicines. These are medicines that will help kill the bacteria causing the infection.  Rarely, sinusitis is caused by a fungal infection. In theses cases, your caregiver will prescribe antifungal medicine. For some cases of chronic sinusitis, surgery is needed. Generally, these are cases in which sinusitis recurs more than 3 times per year, despite other treatments. HOME CARE INSTRUCTIONS  Drink plenty of water. Water helps thin the mucus so your sinuses can drain more easily. Use a humidifier. Inhale steam 3 to 4 times a day (for example, sit in the bathroom with the shower running). Apply a warm, moist washcloth to your face 3 to 4 times a day, or as directed by your caregiver. Use saline nasal sprays to help moisten and clean your sinuses. Take over-the-counter or prescription medicines for pain, discomfort, or fever only as directed by your caregiver. SEEK IMMEDIATE MEDICAL CARE IF: You have increasing pain or severe headaches. You have nausea, vomiting, or drowsiness. You have swelling around your face. You have vision problems. You have a stiff neck. You have difficulty breathing. MAKE SURE YOU:  Understand these instructions. Will watch  your condition. Will get help right away if you are not doing well or get worse. Document Released: 02/19/2005 Document Revised: 05/14/2011 Document Reviewed: 03/06/2011 [facility] Patient Information 2014 [place], [place].    If you have been instructed to have an in-person evaluation today at a local Urgent Care facility, please use the link below. It will take you to a list of all of our available [facility], including address, phone number and hours of operation. Please do not delay care.  [facility]  If you or a family member do not have a primary care provider, use the link below to schedule a visit and establish care.  When you choose a [facility] primary care physician or advanced practice provider, you gain a long-term partner in health. Find a Primary Care Provider  Learn more about [facility] in-office and virtual care options: [facility] - Get Care Now

## 2023-01-16 NOTE — Progress Notes (Signed)
Virtual Visit Consent   Kelsey Alvarez, you are scheduled for a virtual visit with a [facility] provider today. Just as with appointments in the office, your consent must be obtained to participate. Your consent will be active for this visit and any virtual visit you may have with one of our providers in the next 365 days. If you have a MyChart account, a copy of this consent can be sent to you electronically.  As this is a virtual visit, video technology does not allow for your provider to perform a traditional examination. This may limit your provider's ability to fully assess your condition. If your provider identifies any concerns that need to be evaluated in person or the need to arrange testing (such as labs, EKG, etc.), we will make arrangements to do so. Although advances in technology are sophisticated, we cannot ensure that it will always work on either your end or our end. If the connection with a video visit is poor, the visit may have to be switched to a telephone visit. With either a video or telephone visit, we are not always able to ensure that we have a secure connection.  By engaging in this virtual visit, you consent to the provision of healthcare and authorize for your insurance to be billed (if applicable) for the services provided during this visit. Depending on your insurance coverage, you may receive a charge related to this service.  I need to obtain your verbal consent now. Are you willing to proceed with your visit today? Kelsey Alvarez has provided verbal consent on 01/16/2023 for a virtual visit (video or telephone). [name], [place]  Date: 01/16/2023 7:25 PM  Virtual Visit via Video Note   I, [name], connected with  Kelsey Alvarez  ([id number], 1971/03/01) on 01/16/23 at  7:30 PM EST by a video-enabled telemedicine application and verified that I am speaking with the correct person using two identifiers.  Location: Patient: Virtual Visit  Location Patient: Home Provider: Virtual Visit Location Provider: Home Office   I discussed the limitations of evaluation and management by telemedicine and the availability of in person appointments. The patient expressed understanding and agreed to proceed.    History of Present Illness: Kelsey Alvarez is a 52 y.o. who identifies as a female who was assigned female at birth, and is being seen today for possible sinusitis. Endorses symptoms starting Monday of last week with nasal congestion/head congestion, sinus pressure and post-nasal drainage. Now with sinus pain and ear pain. Denies fever but some chills. No substantial chest congestion. Denies recent travel or sick contact.   OTC -- Nyquil, Flonase, Tylenol.  HPI: HPI  Problems:  Patient Active Problem List   Diagnosis Date Noted   Localized osteoarthritis of left knee 03/09/2022   Lumbar back pain with radiculopathy affecting left lower extremity 03/09/2022   Dyslipidemia associated with type 2 diabetes mellitus (HCC) 01/20/2018   Endometriosis 11/06/2017   Menorrhagia with irregular cycle 10/28/2017   Fibroid uterus 10/28/2017   Carpal tunnel syndrome on both sides 10/17/2017   B12 deficiency 04/27/2016   History of endometriosis 11/17/2015   Oligomenorrhea 10/12/2015   Benign essential HTN 12/28/2014   Cervical polyp 12/28/2014   Controlled type 2 diabetes mellitus with microalbuminuria (HCC) 12/28/2014   Dyslipidemia 12/28/2014   Dysmenorrhea 12/28/2014   Edema 12/28/2014   Female infertility 12/28/2014   Gastro-esophageal reflux disease without esophagitis 12/28/2014   Morbid obesity (HCC) 12/28/2014   Asthma, moderate persistent, poorly-controlled 12/28/2014   Perennial  allergic rhinitis 12/28/2014   Intermittent tremor 12/28/2014   Vitamin D deficiency 12/28/2014   Intermittent low back pain 12/28/2014   Left sciatic nerve pain 12/28/2014    Allergies:  Allergies  Allergen Reactions   Fish Allergy Shortness Of  Breath   Penicillins Anaphylaxis and Rash    Childhood allergy   Crestor [Rosuvastatin]     Headache    Levofloxacin Other (See Comments)    Cramping   Adhesive [Tape] Rash    Paper tape ok to use.   Medications:  Current Outpatient Medications:    albuterol (VENTOLIN HFA) 108 (90 Base) MCG/ACT inhaler, INHALE 2 PUFFS INTO THE LUNGS EVERY 4 HOURS AS NEEDED FOR WHEEZING OR SHORTNESS OF BREATH., Disp: 18 each, Rfl: 0   Alpha Lipoic Acid 200 MG CAPS, Take by mouth., Disp: , Rfl:    Cetirizine HCl (ZYRTEC PO), Take 10 mg by mouth daily., Disp: , Rfl:    Cholecalciferol (VITAMIN D) 2000 UNITS tablet, Take 1 tablet by mouth daily., Disp: , Rfl:    empagliflozin (JARDIANCE) 25 MG TABS tablet, Take 1 tablet (25 mg total) by mouth daily before breakfast., Disp: 90 tablet, Rfl: 0   ezetimibe (ZETIA) 10 MG tablet, Take 1 tablet (10 mg total) by mouth daily., Disp: 90 tablet, Rfl: 0   fluticasone (FLONASE) 50 MCG/ACT nasal spray, Place 2 sprays into both nostrils daily., Disp: 16 g, Rfl: 5   fluticasone furoate-vilanterol (BREO ELLIPTA) 100-25 MCG/ACT AEPB, Inhale 1 puff into the lungs daily., Disp: 180 each, Rfl: 0   gabapentin (NEURONTIN) 100 MG capsule, Take 1-2 capsules (100-200 mg total) by mouth 3 (three) times daily. 100 mg morning and afternoon and two at night, Disp: 120 capsule, Rfl: 0   ipratropium-albuterol (DUONEB) 0.5-2.5 (3) MG/3ML SOLN, Take 3 mLs by nebulization every 4 (four) hours as needed., Disp: 120 mL, Rfl: 0   meclizine (ANTIVERT) 12.5 MG tablet, Take 12.5-25 mg by mouth 3 (three) times daily as needed., Disp: , Rfl:    meloxicam (MOBIC) 7.5 MG tablet, Take 1 tablet (7.5 mg total) by mouth 2 (two) times daily., Disp: 180 tablet, Rfl: 0   montelukast (SINGULAIR) 10 MG tablet, TAKE 1 TABLET BY MOUTH EVERYDAY AT BEDTIME, Disp: 90 tablet, Rfl: 0   pioglitazone (ACTOS) 15 MG tablet, Take 1 tablet (15 mg total) by mouth daily., Disp: 90 tablet, Rfl: 0   pravastatin (PRAVACHOL) 40 MG  tablet, TAKE 1 TABLET (40 MG TOTAL) BY MOUTH 3 (THREE) TIMES A WEEK., Disp: 36 tablet, Rfl: 0   valsartan-hydrochlorothiazide (DIOVAN-HCT) 160-12.5 MG tablet, Take 1 tablet by mouth daily., Disp: 90 tablet, Rfl: 0  Observations/Objective: Patient is well-developed, well-nourished in no acute distress.  Resting comfortably at home.  Head is normocephalic, atraumatic.  No labored breathing. Speech is clear and coherent with logical content.  Patient is alert and oriented at baseline.   Assessment and Plan: 1. Acute bacterial sinusitis  COVID tested at home at time of visit. Results came back negative. Rx Doxycycline.  Increase fluids.  Rest.  Saline nasal spray.  Probiotic.  Mucinex as directed.  Humidifier in bedroom. Continue Flonase nasal spray.  Call or return to clinic if symptoms are not improving.   Follow Up Instructions: I discussed the assessment and treatment plan with the patient. The patient was provided an opportunity to ask questions and all were answered. The patient agreed with the plan and demonstrated an understanding of the instructions.  A copy of instructions were sent to the  patient via MyChart unless otherwise noted below.   The patient was advised to call back or seek an in-person evaluation if the symptoms worsen or if the condition fails to improve as anticipated.    [name], PA-C

## 2023-02-11 NOTE — Progress Notes (Signed)
Name: Kelsey Alvarez   MRN: [id number]    DOB: 1970-10-22   Date:02/22/2023       Progress Note  Subjective  Chief Complaint  Chief Complaint  Patient presents with   Medical Management of Chronic Issues    HPI  Discussed the use of AI scribe software for clinical note transcription with the patient, who gave verbal consent to proceed.  History of Present Illness   The patient, with a history of type 2 diabetes, asthma, and hyperlipidemia, presents with a 5-day history of feeling unwell. She reports a persistent headache, nausea, and a general feeling of malaise, which began on Sunday night. Accompanying these symptoms is a new onset cough and chest tightness, which the patient is unsure if it is related to her known asthma. She also reports ear pain, specifically in the left ear. Despite these symptoms, the patient tested negative for COVID-19 at home.  In addition to these acute symptoms, the patient has been experiencing recurrent yeast infections, with three episodes in the last two months. She attributes this to her current diabetes medication, Jardiance. She also reports a recent episode of tendinitis in the right elbow, which was managed at a [facility].  The patient has been actively trying to manage her weight and has seen a slight decrease recently. She attributes this to dietary changes, including drinking sparkling water and limiting her eating window to between 9 am and 10 pm. Despite these efforts, her cholesterol levels remain high, and she reports muscle aches with her current statin medication, therefore she only takes medication occasionally  The patient's mother was recently diagnosed with breast cancer, which was successfully treated with surgery. This has raised concerns for the patient about her own risk of developing cancer.         Patient Active Problem List   Diagnosis Date Noted   Localized osteoarthritis of left knee 03/09/2022   Lumbar back  pain with radiculopathy affecting left lower extremity 03/09/2022   Dyslipidemia associated with type 2 diabetes mellitus (HCC) 01/20/2018   Endometriosis 11/06/2017   Menorrhagia with irregular cycle 10/28/2017   Fibroid uterus 10/28/2017   Carpal tunnel syndrome on both sides 10/17/2017   B12 deficiency 04/27/2016   History of endometriosis 11/17/2015   Oligomenorrhea 10/12/2015   Benign essential HTN 12/28/2014   Cervical polyp 12/28/2014   Controlled type 2 diabetes mellitus with microalbuminuria (HCC) 12/28/2014   Dyslipidemia 12/28/2014   Dysmenorrhea 12/28/2014   Edema 12/28/2014   Female infertility 12/28/2014   Gastro-esophageal reflux disease without esophagitis 12/28/2014   Morbid obesity (HCC) 12/28/2014   Asthma, moderate persistent, poorly-controlled 12/28/2014   Perennial allergic rhinitis 12/28/2014   Intermittent tremor 12/28/2014   Vitamin D deficiency 12/28/2014   Intermittent low back pain 12/28/2014   Left sciatic nerve pain 12/28/2014    Past Surgical History:  Procedure Laterality Date   APPENDECTOMY     cervical polyp removal     DILATION AND CURETTAGE OF UTERUS     HYSTEROSCOPY     HYSTEROSCOPY WITH D & C N/A 10/12/2015   Procedure: DILATATION AND CURETTAGE /HYSTEROSCOPY;  Surgeon: Conard Novak, MD;  Location: [facility];  Service: Gynecology;  Laterality: N/A;   LAPAROSCOPIC BILATERAL SALPINGO OOPHERECTOMY N/A 10/12/2015   Procedure: LAPAROSCOPIC RIGHT SALPINGECTOMY, LEFT OOPHORECTOMY;  Surgeon: Conard Novak, MD;  Location: [facility];  Service: Gynecology;  Laterality: N/A;   LAPAROSCOPIC OVARIAN CYSTECTOMY Bilateral 10/12/2015   Procedure: LAPAROSCOPIC OVARIAN CYSTECTOMY;  Surgeon: Mila Homer  Jean Rosenthal, MD;  Location: [facility];  Service: Gynecology;  Laterality: Bilateral;   OOPHORECTOMY     OVARIAN CYST REMOVAL      Family History  Problem Relation Age of Onset   Hypertension Mother    Diabetes Mother    CAD Mother    Cancer Mother         cervical   Heart attack Mother 55       Triple Bypass   CAD Father    Hypertension Father    Arthritis Father    Thyroid disease Sister    Alzheimer's disease Maternal Grandmother    Alzheimer's disease Maternal Grandfather    Breast cancer Paternal Grandmother     Social History   Tobacco Use   Smoking status: Former    Current packs/day: 0.00    Types: Cigarettes    Start date: 12/03/2001    Quit date: 12/28/2002    Years since quitting: 20.1   Smokeless tobacco: Never   Tobacco comments:    smoked 3 to 4 cigarrettes a day for a year  Substance Use Topics   Alcohol use: Yes    Alcohol/week: 0.0 standard drinks of alcohol    Comment: occasionally drinks margarita, once or twice a year     Current Outpatient Medications:    albuterol (VENTOLIN HFA) 108 (90 Base) MCG/ACT inhaler, INHALE 2 PUFFS INTO THE LUNGS EVERY 4 HOURS AS NEEDED FOR WHEEZING OR SHORTNESS OF BREATH., Disp: 18 each, Rfl: 0   Alpha Lipoic Acid 200 MG CAPS, Take by mouth., Disp: , Rfl:    Cetirizine HCl (ZYRTEC PO), Take 10 mg by mouth daily., Disp: , Rfl:    Cholecalciferol (VITAMIN D) 2000 UNITS tablet, Take 1 tablet by mouth daily., Disp: , Rfl:    doxycycline (VIBRA-TABS) 100 MG tablet, Take 1 tablet (100 mg total) by mouth 2 (two) times daily., Disp: 20 tablet, Rfl: 0   empagliflozin (JARDIANCE) 25 MG TABS tablet, Take 1 tablet (25 mg total) by mouth daily before breakfast., Disp: 90 tablet, Rfl: 0   ezetimibe (ZETIA) 10 MG tablet, Take 1 tablet (10 mg total) by mouth daily., Disp: 90 tablet, Rfl: 0   fluticasone (FLONASE) 50 MCG/ACT nasal spray, Place 2 sprays into both nostrils daily., Disp: 16 g, Rfl: 5   fluticasone furoate-vilanterol (BREO ELLIPTA) 100-25 MCG/ACT AEPB, Inhale 1 puff into the lungs daily., Disp: 180 each, Rfl: 0   gabapentin (NEURONTIN) 100 MG capsule, Take 1-2 capsules (100-200 mg total) by mouth 3 (three) times daily. 100 mg morning and afternoon and two at night, Disp: 120 capsule,  Rfl: 0   ipratropium-albuterol (DUONEB) 0.5-2.5 (3) MG/3ML SOLN, Take 3 mLs by nebulization every 4 (four) hours as needed., Disp: 120 mL, Rfl: 0   meclizine (ANTIVERT) 12.5 MG tablet, Take 12.5-25 mg by mouth 3 (three) times daily as needed., Disp: , Rfl:    meloxicam (MOBIC) 7.5 MG tablet, Take 1 tablet (7.5 mg total) by mouth 2 (two) times daily., Disp: 180 tablet, Rfl: 0   montelukast (SINGULAIR) 10 MG tablet, TAKE 1 TABLET BY MOUTH EVERYDAY AT BEDTIME, Disp: 90 tablet, Rfl: 0   pioglitazone (ACTOS) 15 MG tablet, TAKE 1 TABLET (15 MG TOTAL) BY MOUTH DAILY., Disp: 90 tablet, Rfl: 0   pravastatin (PRAVACHOL) 40 MG tablet, TAKE 1 TABLET (40 MG TOTAL) BY MOUTH 3 (THREE) TIMES A WEEK., Disp: 36 tablet, Rfl: 0   valsartan-hydrochlorothiazide (DIOVAN-HCT) 160-12.5 MG tablet, Take 1 tablet by mouth daily., Disp: 90 tablet,  Rfl: 0  Allergies  Allergen Reactions   Fish Allergy Shortness Of Breath   Penicillins Anaphylaxis and Rash    Childhood allergy   Crestor [Rosuvastatin]     Headache    Levofloxacin Other (See Comments)    Cramping   Adhesive [Tape] Rash    Paper tape ok to use.    I personally reviewed active problem list, medication list, allergies, family history with the patient/caregiver today.   ROS  Ten systems reviewed and is negative except as mentioned in HPI    Objective  Vitals:   02/22/23 1331 02/22/23 1343  BP: (!) 146/82 (!) 142/82  Pulse: 98   Resp: 16   Temp: 97.8 F (36.6 C)   TempSrc: Oral   SpO2: 96%   Weight: 277 lb 11.2 oz (126 kg)   Height: 5' 2.5" (1.588 m)     Body mass index is 49.98 kg/m.  Physical Exam  Constitutional: Patient appears well-developed and well-nourished. Obese  No distress.  HEENT: head atraumatic, normocephalic, pupils equal and reactive to light, ears scaring, erythema and some effusion noticed on left TM, neck supple, throat mild erythema Cardiovascular: Normal rate, regular rhythm and normal heart sounds.  No murmur  heard. No BLE edema. Pulmonary/Chest: Effort normal and breath sounds normal. No respiratory distress. Abdominal: Soft.  There is no tenderness. Psychiatric: Patient has a normal mood and affect. behavior is normal. Judgment and thought content normal.   Recent Results (from the past 2160 hours)  POCT glycosylated hemoglobin (Hb A1C)     Status: Abnormal   Collection Time: 02/22/23  1:35 PM  Result Value Ref Range   Hemoglobin A1C 6.8 (A) 4.0 - 5.6 %   HbA1c POC (<> result, manual entry)     HbA1c, POC (prediabetic range)     HbA1c, POC (controlled diabetic range)       PHQ2/9:    02/22/2023    1:31 PM 11/20/2022    8:07 AM 06/15/2022    9:14 AM 03/27/2022    8:23 AM 03/09/2022    2:39 PM  Depression screen PHQ 2/9  Decreased Interest 0 0 0 0 0  Down, Depressed, Hopeless 0 0 0 0 0  PHQ - 2 Score 0 0 0 0 0  Altered sleeping  2 0 0 3  Tired, decreased energy  1 0 0 3  Change in appetite  0 0 0 0  Feeling bad or failure about yourself   0 0 0 0  Trouble concentrating  0 0 0 0  Moving slowly or fidgety/restless  0 0 0 0  Suicidal thoughts  0 0 0 0  PHQ-9 Score  3 0 0 6    phq 9 is negative   Fall Risk:    02/22/2023    1:30 PM 11/20/2022    8:07 AM 06/15/2022    9:13 AM 03/27/2022    8:24 AM 03/09/2022    2:39 PM  Fall Risk   Falls in the past year? 0 0 0 0 0  Number falls in past yr: 0 0   0  Injury with Fall? 0 0   0  Risk for fall due to : No Fall Risks No Fall Risks No Fall Risks No Fall Risks No Fall Risks  Follow up Falls prevention discussed;Education provided;Falls evaluation completed Falls prevention discussed Falls prevention discussed Falls prevention discussed;Education provided;Falls evaluation completed Falls prevention discussed     Assessment & Plan   Assessment and Plan  Acute Illness Symptoms of headache, nausea, cough, and ear pain for 5 days. Left otitis media noted on exam. Likely viral illness, but given the presence of otitis media, will  treat with antibiotics. -Prescribe Levaquin 500mg  for 5 days. -Prescribe Tessalon Perles for cough.  Asthma moderate persistent No current wheezing noted on exam. Patient prefers Symbicort over [place]. -Switch from [facility] to Symbicort.  Hyperlipidemia Poorly controlled on Zetia and pravastatin, with patient reporting statin myopathy. -Switch from Zetia and pravastatin to Nexlizet.  Type 2 Diabetes A1c 6.8, indicating good control. However, patient reports recurrent yeast infections likely secondary to Jardiance. -Continue Jardiance. -Prescribe Diflucan for recurrent yeast infections.  Hypertension Blood pressure slightly elevated at this visit, but typically well-controlled. Likely related to acute illness. -Continue valsartan/HCTZ.  Diabetic Neuropathy Gabapentin used as needed for flare-ups. -Continue gabapentin as needed.  General Health Maintenance -Continue Vitamin D supplementation for previous deficiency. -Monitor hemoglobin for potential elevation related to sleep apnea. -Follow-up in 3 months to reassess diabetes control.

## 2023-02-16 ENCOUNTER — Other Ambulatory Visit: Payer: Self-pay | Admitting: Family Medicine

## 2023-02-16 DIAGNOSIS — J4540 Moderate persistent asthma, uncomplicated: Secondary | ICD-10-CM

## 2023-02-16 DIAGNOSIS — E1169 Type 2 diabetes mellitus with other specified complication: Secondary | ICD-10-CM

## 2023-02-18 NOTE — Telephone Encounter (Signed)
Requested Prescriptions  Pending Prescriptions Disp Refills   pioglitazone (ACTOS) 15 MG tablet [Pharmacy Med Name: PIOGLITAZONE HCL 15 MG TABLET] 90 tablet 0    Sig: TAKE 1 TABLET (15 MG TOTAL) BY MOUTH DAILY.     Endocrinology:  Diabetes - Glitazones - pioglitazone Passed - 02/18/2023 10:08 AM      Passed - HBA1C is between 0 and 7.9 and within 180 days    HbA1c, POC (controlled diabetic range)  Date Value Ref Range Status  04/25/2018 6.6 0.0 - 7.0 % Final   Hgb A1c MFr Bld  Date Value Ref Range Status  11/20/2022 6.8 (H) <5.7 % of total Hgb Final    Comment:    For someone without known diabetes, a hemoglobin A1c value of 6.5% or greater indicates that they may have  diabetes and this should be confirmed with a follow-up  test. . For someone with known diabetes, a value <7% indicates  that their diabetes is well controlled and a value  greater than or equal to 7% indicates suboptimal  control. A1c targets should be individualized based on  duration of diabetes, age, comorbid conditions, and  other considerations. . Currently, no consensus exists regarding use of hemoglobin A1c for diagnosis of diabetes for children. Verna Czech - Valid encounter within last 6 months    Recent Outpatient Visits           3 months ago Dyslipidemia associated with type 2 diabetes mellitus [facility])   [place] [facility] [place], Danna Hefty, MD   8 months ago Dyslipidemia associated with type 2 diabetes mellitus [facility])   [place] [facility] Alba Cory, MD   10 months ago Well adult exam   [facility] Alba Cory, MD   11 months ago Type 2 diabetes, controlled, with peripheral neuropathy [facility])   [place] [facility] Alba Cory, MD   1 year ago Type 2 diabetes, controlled, with peripheral neuropathy [facility])   [place] [facility] Alba Cory, MD       Future Appointments              In 4 days Alba Cory, MD [facility]   In 1 month Alba Cory, MD [facility]             montelukast (SINGULAIR) 10 MG tablet [Pharmacy Med Name: MONTELUKAST SOD 10 MG TABLET] 90 tablet 0    Sig: TAKE 1 TABLET BY MOUTH EVERYDAY AT BEDTIME     Pulmonology:  Leukotriene Inhibitors Passed - 02/18/2023 10:08 AM      Passed - Valid encounter within last 12 months    Recent Outpatient Visits           3 months ago Dyslipidemia associated with type 2 diabetes mellitus [facility])   [facility] Alba Cory, MD   8 months ago Dyslipidemia associated with type 2 diabetes mellitus [facility])   [place] [facility] Alba Cory, MD   10 months ago Well adult exam   [facility] Alba Cory, MD   11 months ago Type 2 diabetes, controlled, with peripheral neuropathy [facility])   [place] [facility] Alba Cory, MD   1 year ago Type 2 diabetes, controlled, with peripheral neuropathy [facility])   [facility] Alba Cory, MD  Future Appointments             In 4 days Alba Cory, MD [facility]   In 1 month Alba Cory, MD [facility]

## 2023-02-22 ENCOUNTER — Ambulatory Visit (INDEPENDENT_AMBULATORY_CARE_PROVIDER_SITE_OTHER): Payer: 59 | Admitting: Family Medicine

## 2023-02-22 ENCOUNTER — Other Ambulatory Visit: Payer: Self-pay | Admitting: Family Medicine

## 2023-02-22 ENCOUNTER — Encounter: Payer: Self-pay | Admitting: Family Medicine

## 2023-02-22 VITALS — BP 142/82 | HR 98 | Temp 97.8°F | Resp 16 | Ht 62.5 in | Wt 277.7 lb

## 2023-02-22 DIAGNOSIS — E785 Hyperlipidemia, unspecified: Secondary | ICD-10-CM | POA: Diagnosis not present

## 2023-02-22 DIAGNOSIS — E1142 Type 2 diabetes mellitus with diabetic polyneuropathy: Secondary | ICD-10-CM

## 2023-02-22 DIAGNOSIS — I10 Essential (primary) hypertension: Secondary | ICD-10-CM | POA: Diagnosis not present

## 2023-02-22 DIAGNOSIS — J45909 Unspecified asthma, uncomplicated: Secondary | ICD-10-CM | POA: Insufficient documentation

## 2023-02-22 DIAGNOSIS — R051 Acute cough: Secondary | ICD-10-CM

## 2023-02-22 DIAGNOSIS — E1169 Type 2 diabetes mellitus with other specified complication: Secondary | ICD-10-CM | POA: Diagnosis not present

## 2023-02-22 DIAGNOSIS — G720 Drug-induced myopathy: Secondary | ICD-10-CM

## 2023-02-22 DIAGNOSIS — B3731 Acute candidiasis of vulva and vagina: Secondary | ICD-10-CM

## 2023-02-22 DIAGNOSIS — J4540 Moderate persistent asthma, uncomplicated: Secondary | ICD-10-CM

## 2023-02-22 DIAGNOSIS — E1140 Type 2 diabetes mellitus with diabetic neuropathy, unspecified: Secondary | ICD-10-CM | POA: Insufficient documentation

## 2023-02-22 DIAGNOSIS — Z7984 Long term (current) use of oral hypoglycemic drugs: Secondary | ICD-10-CM

## 2023-02-22 DIAGNOSIS — H6692 Otitis media, unspecified, left ear: Secondary | ICD-10-CM

## 2023-02-22 DIAGNOSIS — R6883 Chills (without fever): Secondary | ICD-10-CM

## 2023-02-22 LAB — POCT GLYCOSYLATED HEMOGLOBIN (HGB A1C): Hemoglobin A1C: 6.8 % — AB (ref 4.0–5.6)

## 2023-02-22 MED ORDER — NEXLIZET 180-10 MG PO TABS: 1.0000 | ORAL_TABLET | Freq: Every day | ORAL | 1 refills | Status: DC

## 2023-02-22 MED ORDER — BUDESONIDE-FORMOTEROL FUMARATE 160-4.5 MCG/ACT IN AERO: 2.0000 | INHALATION_SPRAY | Freq: Two times a day (BID) | RESPIRATORY_TRACT | 2 refills | Status: DC

## 2023-02-22 MED ORDER — BENZONATATE 100 MG PO CAPS: 100.0000 mg | ORAL_CAPSULE | Freq: Two times a day (BID) | ORAL | 0 refills | Status: DC | PRN

## 2023-02-22 MED ORDER — LEVOFLOXACIN 500 MG PO TABS: 500.0000 mg | ORAL_TABLET | Freq: Every day | ORAL | 0 refills | Status: AC

## 2023-02-22 MED ORDER — VALSARTAN-HYDROCHLOROTHIAZIDE 160-12.5 MG PO TABS: 1.0000 | ORAL_TABLET | Freq: Every day | ORAL | 0 refills | Status: DC

## 2023-02-22 MED ORDER — EMPAGLIFLOZIN 25 MG PO TABS: 25.0000 mg | ORAL_TABLET | Freq: Every day | ORAL | 0 refills | Status: DC

## 2023-02-22 MED ORDER — FLUCONAZOLE 150 MG PO TABS: 150.0000 mg | ORAL_TABLET | ORAL | 1 refills | Status: DC

## 2023-02-25 ENCOUNTER — Encounter: Payer: Self-pay | Admitting: Family Medicine

## 2023-02-25 NOTE — Telephone Encounter (Signed)
PA was denied pt needs to try alternatives

## 2023-02-25 NOTE — Telephone Encounter (Signed)
PA done through cover my meds.

## 2023-02-25 NOTE — Telephone Encounter (Signed)
Kelsey Alvarez (Key: [id number]) [phone] Nexlizet 180-10MG  tablets status: PA Response - ApprovedCreated: December 23rd, 2024Sent: December 23rd, 2024

## 2023-03-13 ENCOUNTER — Other Ambulatory Visit: Payer: Self-pay | Admitting: Family Medicine

## 2023-03-13 DIAGNOSIS — E1169 Type 2 diabetes mellitus with other specified complication: Secondary | ICD-10-CM

## 2023-03-29 ENCOUNTER — Encounter: Payer: 59 | Admitting: Family Medicine

## 2023-04-03 ENCOUNTER — Encounter: Payer: 59 | Admitting: Family Medicine

## 2023-04-08 ENCOUNTER — Telehealth: Payer: Self-pay

## 2023-04-08 NOTE — Telephone Encounter (Signed)
Resubmitted prior auth on nexilant

## 2023-04-09 ENCOUNTER — Ambulatory Visit: Payer: Managed Care, Other (non HMO) | Admitting: Family Medicine

## 2023-04-09 ENCOUNTER — Encounter: Payer: Self-pay | Admitting: Family Medicine

## 2023-04-09 VITALS — BP 138/74 | HR 98 | Resp 16 | Ht 62.5 in | Wt 276.3 lb

## 2023-04-09 DIAGNOSIS — Z1231 Encounter for screening mammogram for malignant neoplasm of breast: Secondary | ICD-10-CM | POA: Diagnosis not present

## 2023-04-09 DIAGNOSIS — Z0000 Encounter for general adult medical examination without abnormal findings: Secondary | ICD-10-CM

## 2023-04-09 NOTE — Progress Notes (Signed)
 Name: Kelsey Alvarez   MRN: [id number]    DOB: 1970/10/04   Date:04/09/2023       Progress Note  Subjective  Chief Complaint  Chief Complaint  Patient presents with   Annual Exam    HPI  Patient presents for annual CPE.  Diet: cooks at home, eats out occasionally, she is using airfryer Exercise:  discussed regular physical activity  Last Eye Exam: completed Last Dental Exam: due for a dental exam   Flowsheet Row Office Visit from 04/09/2023 in [facility]  AUDIT-C Score 1       Depression: Phq 9 is  positive - her mother died last week, she is currently grieving      04/09/2023    1:03 PM 02/22/2023    1:31 PM 11/20/2022    8:07 AM 06/15/2022    9:14 AM 03/27/2022    8:23 AM  Depression screen PHQ 2/9  Decreased Interest 1 0 0 0 0  Down, Depressed, Hopeless 1 0 0 0 0  PHQ - 2 Score 2 0 0 0 0  Altered sleeping 1  2 0 0  Tired, decreased energy 1  1 0 0  Change in appetite 0  0 0 0  Feeling bad or failure about yourself  0  0 0 0  Trouble concentrating 0  0 0 0  Moving slowly or fidgety/restless 0  0 0 0  Suicidal thoughts 0  0 0 0  PHQ-9 Score 4  3 0 0  Difficult doing work/chores Somewhat difficult       Hypertension: BP Readings from Last 3 Encounters:  04/09/23 138/74  02/22/23 (!) 142/82  06/29/22 (!) 148/76   Obesity: Wt Readings from Last 3 Encounters:  04/09/23 276 lb 4.8 oz (125.3 kg)  02/22/23 277 lb 11.2 oz (126 kg)  06/15/22 283 lb 8 oz (128.6 kg)   BMI Readings from Last 3 Encounters:  04/09/23 49.73 kg/m  02/22/23 49.98 kg/m  06/15/22 51.03 kg/m     Vaccines: reviewed with the patient.   Hep C Screening: completed STD testing and prevention (HIV/chl/gon/syphilis): N/A Intimate partner violence: negative screen  Sexual History :one partner, married, she has vaginal dryness and some discomfort with sex - history of endometriosis  Menstrual History/LMP/Abnormal Bleeding: only one period last year, LMP about one  year ago.  Discussed importance of follow up if any post-menopausal bleeding: yes , if you bleed again after the end of Feb Incontinence Symptoms: positive for symptoms - stress incontinence   Breast cancer:  - Last Mammogram: she needs to schedule  - BRCA gene screening: mother had breast cancer at age 72, paternal grandmother also had breast cancer and possibly an aunt, advised to confirm history and consider testing   Osteoporosis Prevention : Discussed high calcium  and vitamin D  supplementation, weight bearing exercises Bone density :not applicable   Cervical cancer screening: up-to-date  Skin cancer: Discussed monitoring for atypical lesions  Colorectal cancer:   repeat cologuard in 2027 Lung cancer:  Low Dose CT Chest recommended if Age 56-80 years, 20 pack-year currently smoking OR have quit w/in 15years. Patient does not qualify for screen   ECG: 2022  Advanced Care Planning: A voluntary discussion about advance care planning including the explanation and discussion of advance directives.  Discussed health care proxy and Living will, and the patient was able to identify a health care proxy as husband .  Patient does not have a living will and power of attorney of  health care   Patient Active Problem List   Diagnosis Date Noted   Diabetic neuropathy (HCC) 02/22/2023   Asthma, well controlled 02/22/2023   Localized osteoarthritis of left knee 03/09/2022   Lumbar back pain with radiculopathy affecting left lower extremity 03/09/2022   Dyslipidemia associated with type 2 diabetes mellitus (HCC) 01/20/2018   Endometriosis 11/06/2017   Menorrhagia with irregular cycle 10/28/2017   Fibroid uterus 10/28/2017   Carpal tunnel syndrome on both sides 10/17/2017   B12 deficiency 04/27/2016   History of endometriosis 11/17/2015   Oligomenorrhea 10/12/2015   Benign essential HTN 12/28/2014   Cervical polyp 12/28/2014   Controlled type 2 diabetes mellitus with microalbuminuria (HCC)  12/28/2014   Edema 12/28/2014   Female infertility 12/28/2014   Gastro-esophageal reflux disease without esophagitis 12/28/2014   Morbid obesity (HCC) 12/28/2014   Perennial allergic rhinitis 12/28/2014   Intermittent tremor 12/28/2014   Vitamin D  deficiency 12/28/2014   Intermittent low back pain 12/28/2014   Left sciatic nerve pain 12/28/2014    Past Surgical History:  Procedure Laterality Date   APPENDECTOMY     cervical polyp removal     DILATION AND CURETTAGE OF UTERUS     HYSTEROSCOPY     HYSTEROSCOPY WITH D & C N/A 10/12/2015   Procedure: DILATATION AND CURETTAGE /HYSTEROSCOPY;  Surgeon: Garnette JONETTA Mace, MD;  Location: [facility] OR[facility];  Service: Gynecology;  Laterality: N/A;   LAPAROSCOPIC BILATERAL SALPINGO OOPHERECTOMY N/A 10/12/2015   Procedure: LAPAROSCOPIC RIGHT SALPINGECTOMY, LEFT OOPHORECTOMY;  Surgeon: Garnette JONETTA Mace, MD;  Location: [facility];  Service: Gynecology;  Laterality: N/A;   LAPAROSCOPIC OVARIAN CYSTECTOMY Bilateral 10/12/2015   Procedure: LAPAROSCOPIC OVARIAN CYSTECTOMY;  Surgeon: Garnette JONETTA Mace, MD;  Location: [facility];  Service: Gynecology;  Laterality: Bilateral;   OOPHORECTOMY     OVARIAN CYST REMOVAL      Family History  Problem Relation Age of Onset   Hypertension Mother    Diabetes Mother    CAD Mother    Cancer Mother 13       breast cancer   Heart attack Mother 13       Triple Bypass   Asthma Mother    Heart disease Mother    CAD Father    Hypertension Father    Arthritis Father    Thyroid disease Sister    Asthma Sister    Alzheimer's disease Maternal Grandmother    Alzheimer's disease Maternal Grandfather    Breast cancer Paternal Grandmother     Social History   Socioeconomic History   Marital status: Married    Spouse name: Salomon   Number of children: 0   Years of education: Not on file   Highest education level: 12th grade  Occupational History   Occupation: geologist, engineering  Tobacco Use   Smoking status: Former    Current  packs/day: 0.00    Average packs/day: 0.3 packs/day for 1 year (0.3 ttl pk-yrs)    Types: Cigarettes    Start date: 12/03/2001    Quit date: 12/28/2002    Years since quitting: 20.2   Smokeless tobacco: Never   Tobacco comments:    smoked 3 to 4 cigarrettes a day for a year  Vaping Use   Vaping status: Never Used  Substance and Sexual Activity   Alcohol use: Yes    Comment: Maybe 2 a year   Drug use: No   Sexual activity: Yes    Partners: Male    Birth control/protection: None    Comment:  Partial Hysterectomy  Other Topics Concern   Not on file  Social History Narrative   Not on file   Social Drivers of Health   Financial Resource Strain: Low Risk  (04/08/2023)   Overall Financial Resource Strain (CARDIA)    Difficulty of Paying Living Expenses: Not very hard  Food Insecurity: No Food Insecurity (04/08/2023)   Hunger Vital Sign    Worried About Running Out of Food in the Last Year: Never true    Ran Out of Food in the Last Year: Never true  Transportation Needs: No Transportation Needs (04/08/2023)   PRAPARE - Administrator, Civil Service (Medical): No    Lack of Transportation (Non-Medical): No  Physical Activity: Unknown (04/08/2023)   Exercise Vital Sign    Days of Exercise per Week: 0 days    Minutes of Exercise per Session: Not on file  Stress: No Stress Concern Present (04/08/2023)   [employer] of Occupational Health - Occupational Stress Questionnaire    Feeling of Stress : Not at all  Social Connections: Moderately Integrated (04/08/2023)   Social Connection and Isolation Panel [NHANES]    Frequency of Communication with Friends and Family: More than three times a week    Frequency of Social Gatherings with Friends and Family: Once a week    Attends Religious Services: More than 4 times per year    Active Member of [employer] or Organizations: No    Attends Banker Meetings: Not on file    Marital Status: Married  Catering Manager Violence:  Not At Risk (04/09/2023)   Humiliation, Afraid, Rape, and Kick questionnaire    Fear of Current or Ex-Partner: No    Emotionally Abused: No    Physically Abused: No    Sexually Abused: No     Current Outpatient Medications:    albuterol  (VENTOLIN  HFA) 108 (90 Base) MCG/ACT inhaler, INHALE 2 PUFFS INTO THE LUNGS EVERY 4 HOURS AS NEEDED FOR WHEEZING OR SHORTNESS OF BREATH., Disp: 18 each, Rfl: 0   Alpha Lipoic Acid 200 MG CAPS, Take by mouth., Disp: , Rfl:    Bempedoic Acid-Ezetimibe  (NEXLIZET ) 180-10 MG TABS, Take 1 tablet by mouth daily at 12 noon. In place of pravastatin  and zetia , Disp: 90 tablet, Rfl: 1   benzonatate  (TESSALON ) 100 MG capsule, Take 1 capsule (100 mg total) by mouth 2 (two) times daily as needed for cough., Disp: 40 capsule, Rfl: 0   Cetirizine HCl (ZYRTEC PO), Take 10 mg by mouth daily., Disp: , Rfl:    Cholecalciferol (VITAMIN D ) 2000 UNITS tablet, Take 1 tablet by mouth daily., Disp: , Rfl:    empagliflozin  (JARDIANCE ) 25 MG TABS tablet, Take 1 tablet (25 mg total) by mouth daily before breakfast., Disp: 90 tablet, Rfl: 0   fluconazole  (DIFLUCAN ) 150 MG tablet, Take 1 tablet (150 mg total) by mouth every other day., Disp: 12 tablet, Rfl: 1   fluticasone  (FLONASE ) 50 MCG/ACT nasal spray, Place 2 sprays into both nostrils daily., Disp: 16 g, Rfl: 5   fluticasone -salmeterol (WIXELA INHUB) 250-50 MCG/ACT AEPB, Inhale 1 puff into the lungs in the morning and at bedtime., Disp: 60 each, Rfl: 5   gabapentin  (NEURONTIN ) 100 MG capsule, Take 1-2 capsules (100-200 mg total) by mouth 3 (three) times daily. 100 mg morning and afternoon and two at night, Disp: 120 capsule, Rfl: 0   ipratropium-albuterol  (DUONEB) 0.5-2.5 (3) MG/3ML SOLN, Take 3 mLs by nebulization every 4 (four) hours as needed., Disp: 120 mL, Rfl:  0   meclizine (ANTIVERT) 12.5 MG tablet, Take 12.5-25 mg by mouth 3 (three) times daily as needed., Disp: , Rfl:    meloxicam  (MOBIC ) 7.5 MG tablet, Take 1 tablet (7.5 mg  total) by mouth 2 (two) times daily., Disp: 180 tablet, Rfl: 0   montelukast  (SINGULAIR ) 10 MG tablet, TAKE 1 TABLET BY MOUTH EVERYDAY AT BEDTIME, Disp: 90 tablet, Rfl: 0   pioglitazone  (ACTOS ) 15 MG tablet, TAKE 1 TABLET (15 MG TOTAL) BY MOUTH DAILY., Disp: 90 tablet, Rfl: 0   valsartan -hydrochlorothiazide  (DIOVAN -HCT) 160-12.5 MG tablet, Take 1 tablet by mouth daily., Disp: 90 tablet, Rfl: 0  Allergies  Allergen Reactions   Fish Allergy Shortness Of Breath   Penicillins Anaphylaxis and Rash    Childhood allergy   Crestor  [Rosuvastatin ]     Headache    Adhesive [Tape] Rash    Paper tape ok to use.     ROS  Constitutional: Negative for fever or weight change.  Respiratory: Negative for cough and shortness of breath.   Cardiovascular: Negative for chest pain or palpitations.  Gastrointestinal: Negative for abdominal pain, no bowel changes.  Musculoskeletal: Negative for gait problem or joint swelling.  Skin: Negative for rash.  Neurological: Negative for dizziness or headache.  No other specific complaints in a complete review of systems (except as listed in HPI above).   Objective  Vitals:   04/09/23 1305  BP: 138/74  Pulse: 98  Resp: 16  SpO2: 98%  Weight: 276 lb 4.8 oz (125.3 kg)  Height: 5' 2.5 (1.588 m)    Body mass index is 49.73 kg/m.  Physical Exam  Constitutional: Patient appears well-developed and well-nourished. No distress.  HENT: Head: Normocephalic and atraumatic. Ears: B TMs ok, no erythema or effusion; Nose: Nose normal. Mouth/Throat: Oropharynx is clear and moist. No oropharyngeal exudate.  Eyes: Conjunctivae and EOM are normal. Pupils are equal, round, and reactive to light. No scleral icterus.  Neck: Normal range of motion. Neck supple. No JVD present. No thyromegaly present.  Cardiovascular: Normal rate, regular rhythm and normal heart sounds.  No murmur heard. No BLE edema. Pulmonary/Chest: Effort normal and breath sounds normal. No respiratory  distress. Abdominal: Soft. Bowel sounds are normal, no distension. There is no tenderness. no masses Breast: no lumps or masses, no nipple discharge or rashes FEMALE GENITALIA:  Not done  RECTAL: not done  Musculoskeletal: Normal range of motion, no joint effusions. No gross deformities Neurological: he is alert and oriented to person, place, and time. No cranial nerve deficit. Coordination, balance, strength, speech and gait are normal.  Skin: Skin is warm and dry. No rash noted. No erythema.  Psychiatric: Patient has a normal mood and affect. behavior is normal. Judgment and thought content normal.     Assessment & Plan  1. Well adult exam (Primary)   2. Encounter for screening mammogram for malignant neoplasm of breast  - MM 3D SCREENING MAMMOGRAM BILATERAL BREAST; Future    -USPSTF grade A and B recommendations reviewed with patient; age-appropriate recommendations, preventive care, screening tests, etc discussed and encouraged; healthy living encouraged; see AVS for patient education given to patient -Discussed importance of 150 minutes of physical activity weekly, eat two servings of fish weekly, eat one serving of tree nuts ( cashews, pistachios, pecans, almonds.SABRA) every other day, eat 6 servings of fruit/vegetables daily and drink plenty of water and avoid sweet beverages.   -Reviewed Health Maintenance: Yes.

## 2023-04-23 ENCOUNTER — Telehealth: Payer: Self-pay | Admitting: Family Medicine

## 2023-04-23 NOTE — Telephone Encounter (Signed)
 Insurance is not accepting Bempedoic Acid-Ezetimibe (NEXLIZET) 180-10 MG TABS or empagliflozin (JARDIANCE) 25 MG TABS tablet  Both will need either prior authorization or alternative options. Please advise

## 2023-04-24 NOTE — Telephone Encounter (Signed)
 Please write new rx for farxiga

## 2023-04-24 NOTE — Telephone Encounter (Signed)
 Nexlizet was approved will let patient know.   As I am trying to do PA for patient Kelsey Alvarez: The drug you are requesting prior authorization for is not covered. Please select an alternative drug from the choices provided and send in a new prescription for that drug.   Metformin and Marcelline Deist only options

## 2023-04-26 ENCOUNTER — Telehealth: Payer: Self-pay | Admitting: Family Medicine

## 2023-04-26 ENCOUNTER — Other Ambulatory Visit: Payer: Self-pay | Admitting: Family Medicine

## 2023-04-26 MED ORDER — EMPAGLIFLOZIN 10 MG PO TABS: 10.0000 mg | ORAL_TABLET | Freq: Every day | ORAL | 1 refills | Status: DC

## 2023-04-26 NOTE — Telephone Encounter (Unsigned)
 Copied from CRM [id number]. Topic: Clinical - Medication Question >> Apr 26, 2023  4:04 PM Gery Pray wrote: Reason for CRM: Patient calling to check if the prescription for Farxiga (dapagliflozin) has been submitted into the pharmacy. Please call patient at [phone] regarding medication.

## 2023-04-26 NOTE — Telephone Encounter (Signed)
 Please send for farxiga

## 2023-04-26 NOTE — Telephone Encounter (Signed)
 Pt.notified

## 2023-04-29 ENCOUNTER — Other Ambulatory Visit: Payer: Self-pay | Admitting: Family Medicine

## 2023-04-29 MED ORDER — DAPAGLIFLOZIN PROPANEDIOL 10 MG PO TABS: 10.0000 mg | ORAL_TABLET | Freq: Every day | ORAL | 1 refills | Status: DC

## 2023-05-03 ENCOUNTER — Telehealth: Payer: Self-pay

## 2023-05-03 NOTE — Telephone Encounter (Signed)
 I will work on the PA as soon as I am able to it.

## 2023-05-03 NOTE — Telephone Encounter (Signed)
 Copied from CRM [id number]. Topic: Clinical - Prescription Issue >> May 03, 2023  2:25 PM Alcus Dad H wrote: Reason for CRM: Patients says [facility] is needing a prior authorization for medication dapagliflozin propanediol (FARXIGA) 10 MG TABS tablet. Patient also mentioned if the prior authorization request is lengthy or going to be an issue, the doctor can just write a prescription for another medication.

## 2023-05-06 NOTE — Telephone Encounter (Signed)
 I have done PA and explained to patient insurance will cover brand Marcelline Deist which is the option I choose for during the PA, verified with PCP.

## 2023-05-10 NOTE — Telephone Encounter (Signed)
 Patient called to get an update on the PA for Farxiga. Patient states that her insurance does not show where the PA has been submitted. Please advise.

## 2023-05-15 ENCOUNTER — Other Ambulatory Visit: Payer: Self-pay

## 2023-05-15 DIAGNOSIS — E1169 Type 2 diabetes mellitus with other specified complication: Secondary | ICD-10-CM

## 2023-05-15 MED ORDER — FARXIGA 10 MG PO TABS: 10.0000 mg | ORAL_TABLET | Freq: Every day | ORAL | 1 refills | Status: DC

## 2023-05-20 ENCOUNTER — Other Ambulatory Visit: Payer: Self-pay | Admitting: Family Medicine

## 2023-05-20 DIAGNOSIS — E1169 Type 2 diabetes mellitus with other specified complication: Secondary | ICD-10-CM

## 2023-05-20 DIAGNOSIS — J4540 Moderate persistent asthma, uncomplicated: Secondary | ICD-10-CM

## 2023-06-21 ENCOUNTER — Other Ambulatory Visit: Payer: Self-pay | Admitting: Family Medicine

## 2023-06-21 DIAGNOSIS — I10 Essential (primary) hypertension: Secondary | ICD-10-CM

## 2023-07-14 ENCOUNTER — Other Ambulatory Visit: Payer: Self-pay | Admitting: Family Medicine

## 2023-07-14 DIAGNOSIS — J4540 Moderate persistent asthma, uncomplicated: Secondary | ICD-10-CM

## 2023-07-14 DIAGNOSIS — E1169 Type 2 diabetes mellitus with other specified complication: Secondary | ICD-10-CM

## 2023-07-18 ENCOUNTER — Other Ambulatory Visit: Payer: Self-pay | Admitting: Family Medicine

## 2023-07-18 DIAGNOSIS — I10 Essential (primary) hypertension: Secondary | ICD-10-CM

## 2023-07-23 ENCOUNTER — Ambulatory Visit: Payer: Self-pay | Admitting: Family Medicine

## 2023-07-23 ENCOUNTER — Encounter: Payer: Self-pay | Admitting: Family Medicine

## 2023-07-23 VITALS — BP 122/68 | HR 88 | Temp 97.9°F | Ht 62.5 in | Wt 277.3 lb

## 2023-07-23 DIAGNOSIS — E1142 Type 2 diabetes mellitus with diabetic polyneuropathy: Secondary | ICD-10-CM

## 2023-07-23 DIAGNOSIS — E559 Vitamin D deficiency, unspecified: Secondary | ICD-10-CM

## 2023-07-23 DIAGNOSIS — R1011 Right upper quadrant pain: Secondary | ICD-10-CM

## 2023-07-23 DIAGNOSIS — G720 Drug-induced myopathy: Secondary | ICD-10-CM

## 2023-07-23 DIAGNOSIS — E538 Deficiency of other specified B group vitamins: Secondary | ICD-10-CM

## 2023-07-23 DIAGNOSIS — J4540 Moderate persistent asthma, uncomplicated: Secondary | ICD-10-CM

## 2023-07-23 DIAGNOSIS — I10 Essential (primary) hypertension: Secondary | ICD-10-CM

## 2023-07-23 DIAGNOSIS — E1169 Type 2 diabetes mellitus with other specified complication: Secondary | ICD-10-CM | POA: Diagnosis not present

## 2023-07-23 DIAGNOSIS — R110 Nausea: Secondary | ICD-10-CM

## 2023-07-23 DIAGNOSIS — K219 Gastro-esophageal reflux disease without esophagitis: Secondary | ICD-10-CM

## 2023-07-23 DIAGNOSIS — R400 Somnolence: Secondary | ICD-10-CM

## 2023-07-23 LAB — POCT GLYCOSYLATED HEMOGLOBIN (HGB A1C): Hemoglobin A1C: 6.4 % — AB (ref 4.0–5.6)

## 2023-07-23 MED ORDER — GABAPENTIN 100 MG PO CAPS
100.0000 mg | ORAL_CAPSULE | Freq: Three times a day (TID) | ORAL | 0 refills | Status: DC
Start: 2023-07-23 — End: 2023-11-29

## 2023-07-23 MED ORDER — PIOGLITAZONE HCL 15 MG PO TABS
15.0000 mg | ORAL_TABLET | Freq: Every day | ORAL | 1 refills | Status: DC
Start: 2023-07-23 — End: 2024-01-19

## 2023-07-23 MED ORDER — NEXLIZET 180-10 MG PO TABS: 1.0000 | ORAL_TABLET | Freq: Every day | ORAL | 1 refills | Status: DC

## 2023-07-23 MED ORDER — VALSARTAN-HYDROCHLOROTHIAZIDE 160-12.5 MG PO TABS: 1.0000 | ORAL_TABLET | Freq: Every day | ORAL | 1 refills | Status: DC

## 2023-07-23 MED ORDER — BUDESONIDE-FORMOTEROL FUMARATE 160-4.5 MCG/ACT IN AERO: 2.0000 | INHALATION_SPRAY | Freq: Two times a day (BID) | RESPIRATORY_TRACT | 1 refills | Status: DC

## 2023-07-23 MED ORDER — MONTELUKAST SODIUM 10 MG PO TABS
10.0000 mg | ORAL_TABLET | Freq: Every day | ORAL | 1 refills | Status: DC
Start: 2023-07-23 — End: 2024-01-19

## 2023-07-23 MED ORDER — AIRSUPRA 90-80 MCG/ACT IN AERO
2.0000 | INHALATION_SPRAY | Freq: Four times a day (QID) | RESPIRATORY_TRACT | 0 refills | Status: DC | PRN
Start: 2023-07-23 — End: 2023-12-23

## 2023-07-23 NOTE — Progress Notes (Addendum)
 Name: Kelsey Alvarez   MRN: [id number]    DOB: 1970-09-28   Date:07/23/2023       Progress Note   Subjective  Chief Complaint  Chief Complaint  Patient presents with   medical managment   HPI   DM:  hgbA1C down from 6.8 % last Fall to 6.4 % today, she has been taking Farxiga   She was unable to tolerate Metformin  or GLP-1 agonist , she is current on Jardiance   and started on Actos  Fall 2022 .She still has neuropathy symptoms ( hands and feet)  she takes gabapentin  prn since alpha lipoic acid recommended by neurologist has improved symptoms. . She  is now taking Nexlizet  for dyslipidemia and tolerating it well    Morbid Obesity:off Ozempic , she stopped because she could not eat anything, she had lost 12 lbs in a short period of time,  we tried switching to Rybelsus  but only able to tolerate 3 mg dose, her weight went down 8 lbs but she also stopped due to side effects.  Weight is down to 277.3 lbs   She is now drinking sparkling waters, down to one to two cokes a day, eating more protein and cutting down on carbohydrates    Asthma: she has moderate asthma,she does not do well on Wixella but would like to try going back on Symbicort  again, she also takes singulair , She has nocturnal cough, occasional wheezing - usually when out in the heat. No sob.  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 We will recheck next visit    HTN: taking Valsartan  hctz  , BP is at goal , she denies  side effects. No chest pain , SOB is stable. r   Low back pain : used to be intermittent across lower back, pain is constant but worse in the mornings..We gave her gabapentin  and meloxicam , she was seen by Ortho and was told she has arthritis. Advised to take gabapentin  every night but states she feels groggy the following day. Cannot tolerate  Lyrica , she also takes Meloxicam    Left ankle pain and swelling: she had a left knee sprain back in 2017, saw Dr. Luster Salters in 2018 and diagnosed with synovitis of ankle,she went back to see him this Spring and had to wear a boot and steroid injection and still has occasional pain and swelling but doing much better  GERD: controlled with prn Tums at this time  RUQ pain: intermittently over the past month , but getting worse, always post prandially associated with nausea, no fever but had chills intermittently   Patient Active Problem List   Diagnosis Date Noted   Diabetic neuropathy (HCC) 02/22/2023   Asthma, well controlled 02/22/2023   Localized osteoarthritis of left knee 03/09/2022   Lumbar back pain with radiculopathy affecting left lower extremity 03/09/2022   Dyslipidemia associated with type 2 diabetes mellitus (HCC) 01/20/2018   Endometriosis 11/06/2017   Menorrhagia with irregular cycle 10/28/2017   Fibroid uterus 10/28/2017   Carpal tunnel syndrome on both sides 10/17/2017   B12 deficiency 04/27/2016   History of endometriosis 11/17/2015   Oligomenorrhea 10/12/2015   Benign essential HTN 12/28/2014   Cervical polyp 12/28/2014   Controlled type 2 diabetes mellitus with microalbuminuria (HCC) 12/28/2014   Edema 12/28/2014   Female infertility 12/28/2014   Gastro-esophageal reflux disease without esophagitis 12/28/2014  Morbid obesity (HCC) 12/28/2014   Perennial allergic rhinitis 12/28/2014   Intermittent tremor 12/28/2014   Vitamin D  deficiency 12/28/2014   Intermittent low back pain 12/28/2014   Left sciatic nerve pain 12/28/2014    Past Surgical History:  Procedure Laterality Date   APPENDECTOMY     cervical polyp removal     DILATION AND CURETTAGE OF UTERUS     HYSTEROSCOPY     HYSTEROSCOPY WITH D & C N/A 10/12/2015   Procedure: DILATATION AND CURETTAGE /HYSTEROSCOPY;  Surgeon: Kris Pester, MD;  Location: [facility];  Service: Gynecology;  Laterality: N/A;    LAPAROSCOPIC BILATERAL SALPINGO OOPHERECTOMY N/A 10/12/2015   Procedure: LAPAROSCOPIC RIGHT SALPINGECTOMY, LEFT OOPHORECTOMY;  Surgeon: Kris Pester, MD;  Location: [facility];  Service: Gynecology;  Laterality: N/A;   LAPAROSCOPIC OVARIAN CYSTECTOMY Bilateral 10/12/2015   Procedure: LAPAROSCOPIC OVARIAN CYSTECTOMY;  Surgeon: Kris Pester, MD;  Location: [facility];  Service: Gynecology;  Laterality: Bilateral;   OOPHORECTOMY     OVARIAN CYST REMOVAL      Family History  Problem Relation Age of Onset   Hypertension Mother    Diabetes Mother    CAD Mother    Cancer Mother 86       breast cancer   Heart attack Mother 36       Triple Bypass   Asthma Mother    Heart disease Mother    CAD Father    Hypertension Father    Arthritis Father    Thyroid disease Sister    Asthma Sister    Alzheimer's disease Maternal Grandmother    Alzheimer's disease Maternal Grandfather    Breast cancer Paternal Grandmother     Social History   Tobacco Use   Smoking status: Former    Current packs/day: 0.00    Average packs/day: 0.3 packs/day for 1 year (0.3 ttl pk-yrs)    Types: Cigarettes    Start date: 12/03/2001    Quit date: 12/28/2002    Years since quitting: 20.5   Smokeless tobacco: Never   Tobacco comments:    smoked 3 to 4 cigarrettes a day for a year  Substance Use Topics   Alcohol use: Yes    Comment: Maybe 2 a year     Current Outpatient Medications:    Albuterol -Budesonide  (AIRSUPRA) 90-80 MCG/ACT AERO, Inhale 2 puffs into the lungs 4 (four) times daily as needed., Disp: 31.1 g, Rfl: 0   Alpha Lipoic Acid 200 MG CAPS, Take by mouth., Disp: , Rfl:    budesonide -formoterol  (SYMBICORT ) 160-4.5 MCG/ACT inhaler, Inhale 2 puffs into the lungs 2 (two) times daily., Disp: 3 each, Rfl: 1   Cetirizine HCl (ZYRTEC PO), Take 10 mg by mouth daily., Disp: , Rfl:    Cholecalciferol (VITAMIN D ) 2000 UNITS tablet, Take 1 tablet by mouth daily., Disp: , Rfl:    FARXIGA  10 MG TABS tablet,  Take 1 tablet (10 mg total) by mouth daily before breakfast., Disp: 90 tablet, Rfl: 1   fluticasone  (FLONASE ) 50 MCG/ACT nasal spray, Place 2 sprays into both nostrils daily., Disp: 16 g, Rfl: 5   ipratropium-albuterol  (DUONEB) 0.5-2.5 (3) MG/3ML SOLN, Take 3 mLs by nebulization every 4 (four) hours as needed., Disp: 120 mL, Rfl: 0   meclizine (ANTIVERT) 12.5 MG tablet, Take 12.5-25 mg by mouth 3 (three) times daily as needed., Disp: , Rfl:    meloxicam  (MOBIC ) 7.5 MG tablet, Take 1 tablet (7.5 mg total) by mouth 2 (two) times daily., Disp: 180 tablet, Rfl:  0   Bempedoic Acid-Ezetimibe  (NEXLIZET ) 180-10 MG TABS, Take 1 tablet by mouth daily at 12 noon. In place of pravastatin  and zetia , Disp: 90 tablet, Rfl: 1   fluconazole  (DIFLUCAN ) 150 MG tablet, Take 1 tablet (150 mg total) by mouth every other day. (Patient not taking: Reported on 07/23/2023), Disp: 12 tablet, Rfl: 1   gabapentin  (NEURONTIN ) 100 MG capsule, Take 1 capsule (100 mg total) by mouth 3 (three) times daily. 100 mg morning and afternoon and two at night, Disp: 90 capsule, Rfl: 0   montelukast  (SINGULAIR ) 10 MG tablet, Take 1 tablet (10 mg total) by mouth at bedtime. TAKE 1 TABLET BY MOUTH EVERYDAY AT BEDTIME, Disp: 90 tablet, Rfl: 1   pioglitazone  (ACTOS ) 15 MG tablet, Take 1 tablet (15 mg total) by mouth daily., Disp: 90 tablet, Rfl: 1   valsartan -hydrochlorothiazide  (DIOVAN -HCT) 160-12.5 MG tablet, Take 1 tablet by mouth daily., Disp: 90 tablet, Rfl: 1  Allergies  Allergen Reactions   Fish Allergy Shortness Of Breath   Penicillins Anaphylaxis and Rash    Childhood allergy   Crestor  [Rosuvastatin ]     Headache    Adhesive [Tape] Rash    Paper tape ok to use.    I personally reviewed active problem list, medication list, allergies, family history with the patient/caregiver today.   ROS  Ten systems reviewed and is negative except as mentioned in HPI    Objective Physical Exam   Vitals:   07/23/23 1523  BP: 122/68   Pulse: 88  Temp: 97.9 F (36.6 C)  TempSrc: Oral  SpO2: 99%  Weight: 277 lb 4.8 oz (125.8 kg)  Height: 5' 2.5" (1.588 m)    Body mass index is 49.91 kg/m.  Recent Results (from the past 2160 hours)  POCT glycosylated hemoglobin (Hb A1C)     Status: Abnormal   Collection Time: 07/23/23  3:49 PM  Result Value Ref Range   Hemoglobin A1C 6.4 (A) 4.0 - 5.6 %   HbA1c POC (<> result, manual entry)     HbA1c, POC (prediabetic range)     HbA1c, POC (controlled diabetic range)      Diabetic Foot Exam:  Diabetic foot exam was performed with the following findings:   No deformities, ulcerations, or other skin breakdown Intact posterior tibialis and dorsalis pedis pulses Failed monofilament test      PHQ2/9:    07/23/2023    3:28 PM 04/09/2023    1:03 PM 02/22/2023    1:31 PM 11/20/2022    8:07 AM 06/15/2022    9:14 AM  Depression screen PHQ 2/9  Decreased Interest 0 1 0 0 0  Down, Depressed, Hopeless 0 1 0 0 0  PHQ - 2 Score 0 2 0 0 0  Altered sleeping 0 1  2 0  Tired, decreased energy 0 1  1 0  Change in appetite 0 0  0 0  Feeling bad or failure about yourself  0 0  0 0  Trouble concentrating 0 0  0 0  Moving slowly or fidgety/restless 0 0  0 0  Suicidal thoughts 0 0  0 0  PHQ-9 Score 0 4  3 0  Difficult doing work/chores Not difficult at all Somewhat difficult       phq 9 is negative  Fall Risk:    07/23/2023    3:28 PM 04/09/2023    1:03 PM 02/22/2023    1:30 PM 11/20/2022    8:07 AM 06/15/2022    9:13 AM  Fall Risk   Falls in the past year? 0 0 0 0 0  Number falls in past yr: 0 0 0 0   Injury with Fall? 0 0 0 0   Risk for fall due to : No Fall Risks No Fall Risks No Fall Risks No Fall Risks No Fall Risks  Follow up Falls evaluation completed Falls prevention discussed;Education provided;Falls evaluation completed Falls prevention discussed;Education provided;Falls evaluation completed Falls prevention discussed Falls prevention discussed     Assessment &  Plan  1. Dyslipidemia associated with type 2 diabetes mellitus (HCC) (Primary)  - POCT glycosylated hemoglobin (Hb A1C) - Urine Microalbumin w/creat. ratio - HM Diabetes Foot Exam - pioglitazone  (ACTOS ) 15 MG tablet; Take 1 tablet (15 mg total) by mouth daily.  Dispense: 90 tablet; Refill: 1 - budesonide -formoterol  (SYMBICORT ) 160-4.5 MCG/ACT inhaler; Inhale 2 puffs into the lungs 2 (two) times daily.  Dispense: 3 each; Refill: 1 - Bempedoic Acid-Ezetimibe  (NEXLIZET ) 180-10 MG TABS; Take 1 tablet by mouth daily at 12 noon. In place of pravastatin  and zetia   Dispense: 90 tablet; Refill: 1 - Ambulatory referral to Sleep Studies  2. Morbid obesity (HCC)  - Ambulatory referral to Sleep Studies  3. Diabetic polyneuropathy associated with type 2 diabetes mellitus (HCC)  - gabapentin  (NEURONTIN ) 100 MG capsule; Take 1 capsule (100 mg total) by mouth 3 (three) times daily. 100 mg morning and afternoon and two at night  Dispense: 90 capsule; Refill: 0  4. Asthma, moderate persistent, well-controlled  - montelukast  (SINGULAIR ) 10 MG tablet; Take 1 tablet (10 mg total) by mouth at bedtime. TAKE 1 TABLET BY MOUTH EVERYDAY AT BEDTIME  Dispense: 90 tablet; Refill: 1  5. Statin myopathy  On Nexlizet   6. B12 deficiency  Continue supplementation   7. Vitamin D  deficiency  Continue vitamin D  supplementation   8. Gastro-esophageal reflux disease without esophagitis  Continue prn medication  9. Benign essential HTN  - valsartan -hydrochlorothiazide  (DIOVAN -HCT) 160-12.5 MG tablet; Take 1 tablet by mouth daily.  Dispense: 90 tablet; Refill: 1 - Ambulatory referral to Sleep Studies  10. Somnolence, daytime  - Ambulatory referral to Sleep Studies   11. Right upper quadrant pain  - US  Abdomen Limited RUQ (LIVER/GB); Future  12. Nausea  - US  Abdomen Limited RUQ (LIVER/GB); Future

## 2023-07-31 ENCOUNTER — Ambulatory Visit: Payer: Self-pay | Admitting: Family Medicine

## 2023-07-31 ENCOUNTER — Ambulatory Visit
Admission: RE | Admit: 2023-07-31 | Discharge: 2023-07-31 | Disposition: A | Discharge status: Home / Self Care | Source: Ambulatory Visit | Attending: Family Medicine | Admitting: Family Medicine

## 2023-07-31 DIAGNOSIS — R1011 Right upper quadrant pain: Secondary | ICD-10-CM | POA: Insufficient documentation

## 2023-07-31 DIAGNOSIS — R110 Nausea: Secondary | ICD-10-CM | POA: Insufficient documentation

## 2023-08-08 ENCOUNTER — Ambulatory Visit
Admission: RE | Admit: 2023-08-08 | Discharge: 2023-08-08 | Disposition: A | Discharge status: Home / Self Care | Source: Ambulatory Visit | Attending: Family Medicine | Admitting: Family Medicine

## 2023-08-08 DIAGNOSIS — Z1231 Encounter for screening mammogram for malignant neoplasm of breast: Secondary | ICD-10-CM | POA: Diagnosis present

## 2023-10-07 NOTE — Progress Notes (Signed)
 History of Present Illness:   Kelsey Alvarez is a 53 y.o. female here for acute onset of right knee pain and swelling.  This happened yesterday, while she was merely descending the stairs.  She denies twisting her knee in any way.  She denies falling or any in any way.  She denies any awkward movement.  She reports pain over the lateral aspect of the right knee, with swelling, and pain in the lateral aspect of the popliteal fossa and lower hamstring.  She states that it is very difficult to walk at this time.  She can ambulate, but with a lot of pain.  She denies any numbness, tingling, weakness in extremity.  She denies any other injuries.  She denies any head injury.  She states that she has been told in the past that she has arthritis in both knees.   Past Medical History:   Past Medical History:  Diagnosis Date  . Arrhythmia   . Arthritis   . Asthma without status asthmaticus (HHS-HCC)   . Diabetes mellitus type 2, uncomplicated (CMS/HHS-HCC)   . Hypertension   . Neuropathy     Past Surgical History:   Past Surgical History:  Procedure Laterality Date  . OOPHORECTOMY      Allergies:   Allergies  Allergen Reactions  . Fish Containing Products Shortness Of Breath  . Levaquin  [Levofloxacin ] Abdominal Pain    Cramping  . Penicillin Rash  . Rosuvastatin  Headache    Headache  . Adhesive Tape-Silicones Rash    Paper tape ok to use.    Current Medications:   Prior to Admission medications  Medication Sig Taking? Last Dose  albuterol  (PROVENTIL ) 2.5 mg /3 mL (0.083 %) nebulizer solution Take 3 mLs (2.5 mg total) by nebulization every 6 (six) hours as needed for Wheezing Yes PRN Not Currently Taking  alpha lipoic acid 200 mg Cap Take by mouth Yes Taking  budesonide -formoterol  (SYMBICORT ) 160-4.5 mcg/actuation inhaler Inhale into the lungs Yes Taking  cholecalciferol (VITAMIN D3) 2,000 unit tablet Take by mouth Yes Taking  ezetimibe  (ZETIA ) 10 mg tablet Take 1 tablet by  mouth once daily Yes Taking  FARXIGA  10 mg tablet  Yes Taking  gabapentin  (NEURONTIN ) 300 MG capsule TAKE 1 CAPSULE BY MOUTH THREE TIMES DAILY Yes PRN Not Currently Taking  ipratropium-albuterol  (DUO-NEB) nebulizer solution Inhale into the lungs Yes PRN Not Currently Taking  levocetirizine (XYZAL) 5 MG tablet Take by mouth Yes Taking  meclizine (ANTIVERT) 12.5 mg tablet Take by mouth Yes PRN Not Currently Taking  montelukast  (SINGULAIR ) 10 mg tablet Take by mouth. Yes Taking  NEXLIZET  180-10 mg Tab Take 1 tablet by mouth Yes Taking  omeprazole  (PRILOSEC) 10 MG DR capsule Take by mouth Yes Taking  pioglitazone  (ACTOS ) 15 MG tablet Take 15 mg by mouth once daily Yes Taking  QVAR  REDIHALER 40 mcg/actuation inhaler Inhale 2 inhalations into the lungs 2 (two) times daily Yes Taking  valsartan -hydroCHLOROthiazide  (DIOVAN -HCT) 160-12.5 mg tablet Take 1 tablet by mouth once daily Yes Taking  aspirin  81 MG EC tablet Take by mouth Patient not taking: Reported on 10/07/2023  Not Taking  cyanocobalamin  (VITAMIN B12) 1,000 mcg/mL injection Inject into the muscle monthly Patient not taking: Reported on 10/07/2023  Not Taking  doxycycline  (VIBRAMYCIN ) 100 MG capsule Take 1 capsule by mouth 2 (two) times daily Patient not taking: Reported on 10/07/2023  Not Taking  hydrochlorothiazide  (HYDRODIURIL ) 25 MG tablet Take 25 mg by mouth once daily Patient not taking: Reported on 10/07/2023  Not Taking  HYDROcodone-acetaminophen  (NORCO) 5-325 mg tablet Take one tablet at night for pain; may take up to every 6 hours as needed for pain if not working or driving    HYDROcodone-chlorpheniramine (TUSSIONEX) 10-8 mg/5 mL ER suspension Take 5 mLs by mouth every 12 (twelve) hours as needed for Cough Patient not taking: Reported on 10/07/2023  Not Taking  JARDIANCE  25 mg tablet TAKE 1 TABLET BY MOUTH DAILY BEFORE BREAKFAST. Patient not taking: Reported on 10/07/2023  Not Taking  labetalol  (TRANDATE ) 200 MG tablet Take 200 mg by mouth  2 (two) times daily Patient not taking: Reported on 10/07/2023  Not Taking  metFORMIN  (GLUCOPHAGE ) 500 MG tablet Take 500 mg by mouth daily with breakfast Patient not taking: Reported on 10/07/2023  Not Taking  naproxen  (NAPROSYN ) 500 MG tablet Take 1 tablet (500 mg total) by mouth 2 (two) times daily as needed for up to 10 days Take with food.    olmesartan -hydrochlorothiazide  (BENICAR  HCT) 20-12.5 mg tablet Take by mouth Patient not taking: Reported on 10/07/2023  Not Taking  pravastatin  (PRAVACHOL ) 40 MG tablet Take by mouth Patient not taking: Reported on 10/07/2023  Not Taking  semaglutide  (RYBELSUS ) 7 mg tablet Take 1 tablet by mouth once daily Patient not taking: Reported on 10/07/2023  Not Taking  SYNJARDY  XR 25-1,000 mg XR 24 hr biphasic tablet Take 1 tablet by mouth once daily Patient not taking: Reported on 10/07/2023  Not Taking  TRELEGY ELLIPTA  100-62.5-25 mcg inhaler   Not Taking    Family History:   Family History  Problem Relation Name Age of Onset  . Cancer Mother    . Diabetes Mother    . Myocardial Infarction (Heart attack) Mother    . High blood pressure (Hypertension) Mother    . Migraines Mother    . High blood pressure (Hypertension) Father    . Dementia Maternal Grandmother    . Alzheimer's disease Maternal Grandfather    . Brain cancer Paternal Grandmother      Social History:   Social History   Socioeconomic History  . Marital status: Married  Tobacco Use  . Smoking status: Former    Passive exposure: Past  . Smokeless tobacco: Never  Vaping Use  . Vaping status: Never Used  Substance and Sexual Activity  . Alcohol use: Yes    Comment: 1-2 a year  . Drug use: Never  . Sexual activity: Yes   Social Drivers of Corporate investment banker Strain: Low Risk  (04/08/2023)   Received from [facility]   Overall Financial Resource Strain (CARDIA)   . Difficulty of Paying Living Expenses: Not very hard  Food Insecurity: No Food Insecurity (04/08/2023)    Received from [facility]   Hunger Vital Sign   . Within the past 12 months, you worried that your food would run out before you got the money to buy more.: Never true   . Within the past 12 months, the food you bought just didn't last and you didn't have money to get more.: Never true  Transportation Needs: No Transportation Needs (04/08/2023)   Received from [facility] - Transportation   . Lack of Transportation (Medical): No   . Lack of Transportation (Non-Medical): No  Physical Activity: Inactive (04/09/2023)   Received from [facility]   Exercise Vital Sign   . On average, how many days per week do you engage in moderate to strenuous exercise (like a brisk walk)?: 0 days   .  On average, how many minutes do you engage in exercise at this level?: 0 min  Stress: No Stress Concern Present (04/08/2023)   Received from [facility] - Occupational Stress Questionnaire   . Feeling of Stress : Not at all  Social Connections: Moderately Integrated (04/08/2023)   Received from [facility]   Social Connection and Isolation Panel   . In a typical week, how many times do you talk on the phone with family, friends, or neighbors?: More than three times a week   . How often do you get together with friends or relatives?: Once a week   . How often do you attend church or religious services?: More than 4 times per year   . Do you belong to any clubs or organizations such as church groups, unions, fraternal or athletic groups, or school groups?: No   . Are you married, widowed, divorced, separated, never married, or living with a partner?: Married  Housing Stability: Unknown (04/28/2023)   Housing Stability Vital Sign   . Homeless in the Last Year: No    Review of Systems:   Review of Systems  Musculoskeletal:  Positive for arthralgias, gait problem and joint swelling.  Neurological:  Negative for weakness and numbness.  All other systems reviewed and  are negative.   Vitals:   Vitals:   10/07/23 1333  BP: 116/73  Pulse: 57  Temp: 36.4 C (97.6 F)  TempSrc: Oral  SpO2: 98%  Weight: (!) 129.1 kg (284 lb 9.6 oz)  Height: 154.9 cm (5' 1)     Body mass index is 53.77 kg/m.  Physical Exam:   Physical Exam Constitutional:      Appearance: She is well-developed.  HENT:     Head: Normocephalic and atraumatic.     Right Ear: External ear normal.     Left Ear: External ear normal.  Eyes:     Conjunctiva/sclera: Conjunctivae normal.     Pupils: Pupils are equal, round, and reactive to light.  Cardiovascular:     Rate and Rhythm: Normal rate and regular rhythm.     Heart sounds: Normal heart sounds. No murmur heard. Pulmonary:     Effort: Pulmonary effort is normal.     Breath sounds: Normal breath sounds.  Abdominal:     Comments: Gastrointestinal system examined as above.  Musculoskeletal:     Cervical back: Normal range of motion and neck supple.     Right knee: No swelling, effusion, ecchymosis or bony tenderness. Normal range of motion. Tenderness present over the lateral joint line. No patellar tendon tenderness. Normal alignment.     Comments: ? POSITIVE lateral McMurray's of RIGHT knee     Assessment and Plan:   Results for orders placed or performed in visit on 10/07/23  X-ray knee right 3 views   Narrative   EXAM: X-ray knee right 3 views  INDICATION:   Acute pain of right knee [F74.438 (ICD-10-CM)]  COMPARISON: None  FINDINGS: No acute fracture or dislocation. Regional soft tissues unremarkable. No aggressive osseus lesion. No knee joint effusion. Enthesopathic change at the superior pole of the patella. Mild medial and patellofemoral compartment osteophyte formation and joint  space loss.    Impression   1.  No acute osseous abnormality. 2.  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 3.  Enthesopathic change at the superior pole of the patella, as can be  seen in  quadriceps tendinitis.  [facility] LONNI ROSELLA, MD  Diagnoses and all orders for this visit:  Acute pain of right knee -     X-ray knee right 3 views -     Elastic bandage -     Ambulatory Referral to Orthopedic Surgery  Other orders -     naproxen  (NAPROSYN ) 500 MG tablet; Take 1 tablet (500 mg total) by mouth 2 (two) times daily as needed for up to 10 days Take with food. -     HYDROcodone-acetaminophen  (NORCO) 5-325 mg tablet; Take one tablet at night for pain; may take up to every 6 hours as needed for pain if not working or driving  NOTE: xray reviewed by me; no acute abnormality noted on my review; await official report  NOTE: elastic bandage ordered, fitted, and applied to patient; tolerated well   Patient Instructions  As we discussed, I did not see any abnormality on your x-rays.  A radiologist will still review your x-rays.  We will call you with these official results once finalized, ONLY IF DIFFERENT FROM MY REVIEW. Otherwise, we will not call you. For now, wear the wrap with all activity until symptoms resolve.  You do not have to sleep in it unless it provides comfort.  You may discontinue the ace wrap once you are pain-free with walking.    Take medications as directed. Use hydrocodone with caution; may cause drowsiness. Do not drive or work while taking. Take mostly at night; may use during the day if not working. Return to care should your symptoms not improve, or worsen in any way.  I have referred you to Orthopaedic Surgery for further treatment and evaluation. They will call you to schedule the appointment.  As we discussed, if you are not improving over the next 10 days to 2 weeks, OR YOU WORSEN ACUTELY, you may visit [facility] on [address].  They have an orthopedic urgent care clinic open 7 days a week, 9 am - 9 PM.  No appointment necessary.   Portions of this note were created using dictation software and may contain typographical errors.    Patient received an After Visit Summary

## 2023-11-09 ENCOUNTER — Emergency Department

## 2023-11-09 ENCOUNTER — Emergency Department
Admission: EM | Admit: 2023-11-09 | Discharge: 2023-11-09 | Disposition: A | Discharge status: Home / Self Care | Attending: Emergency Medicine | Admitting: Emergency Medicine

## 2023-11-09 ENCOUNTER — Other Ambulatory Visit: Payer: Self-pay

## 2023-11-09 DIAGNOSIS — E119 Type 2 diabetes mellitus without complications: Secondary | ICD-10-CM | POA: Diagnosis not present

## 2023-11-09 DIAGNOSIS — I10 Essential (primary) hypertension: Secondary | ICD-10-CM | POA: Insufficient documentation

## 2023-11-09 DIAGNOSIS — R110 Nausea: Secondary | ICD-10-CM | POA: Diagnosis not present

## 2023-11-09 DIAGNOSIS — Z8616 Personal history of COVID-19: Secondary | ICD-10-CM | POA: Diagnosis not present

## 2023-11-09 DIAGNOSIS — R1033 Periumbilical pain: Secondary | ICD-10-CM | POA: Insufficient documentation

## 2023-11-09 DIAGNOSIS — R0602 Shortness of breath: Secondary | ICD-10-CM | POA: Insufficient documentation

## 2023-11-09 LAB — CBC WITH DIFFERENTIAL/PLATELET
Abs Immature Granulocytes: 0.05 K/uL (ref 0.00–0.07)
Basophils Absolute: 0.1 K/uL (ref 0.0–0.1)
Basophils Relative: 1 %
Eosinophils Absolute: 0.1 K/uL (ref 0.0–0.5)
Eosinophils Relative: 2 %
HCT: 46.4 % — ABNORMAL HIGH (ref 36.0–46.0)
Hemoglobin: 15.4 g/dL — ABNORMAL HIGH (ref 12.0–15.0)
Immature Granulocytes: 1 %
Lymphocytes Relative: 38 %
Lymphs Abs: 2.7 K/uL (ref 0.7–4.0)
MCH: 27.3 pg (ref 26.0–34.0)
MCHC: 33.2 g/dL (ref 30.0–36.0)
MCV: 82.1 fL (ref 80.0–100.0)
Monocytes Absolute: 0.5 K/uL (ref 0.1–1.0)
Monocytes Relative: 7 %
Neutro Abs: 3.7 K/uL (ref 1.7–7.7)
Neutrophils Relative %: 51 %
Platelets: 278 K/uL (ref 150–400)
RBC: 5.65 MIL/uL — ABNORMAL HIGH (ref 3.87–5.11)
RDW: 13.3 % (ref 11.5–15.5)
WBC: 7.2 K/uL (ref 4.0–10.5)
nRBC: 0 % (ref 0.0–0.2)

## 2023-11-09 LAB — RESP PANEL BY RT-PCR (RSV, FLU A&B, COVID)  RVPGX2
Influenza A by PCR: NEGATIVE
Influenza B by PCR: NEGATIVE
Resp Syncytial Virus by PCR: NEGATIVE
SARS Coronavirus 2 by RT PCR: POSITIVE — AB

## 2023-11-09 LAB — HEPATIC FUNCTION PANEL
ALT: 39 U/L (ref 0–44)
AST: 37 U/L (ref 15–41)
Albumin: 4 g/dL (ref 3.5–5.0)
Alkaline Phosphatase: 46 U/L (ref 38–126)
Bilirubin, Direct: 0.2 mg/dL (ref 0.0–0.2)
Indirect Bilirubin: 0.6 mg/dL (ref 0.3–0.9)
Total Bilirubin: 0.8 mg/dL (ref 0.0–1.2)
Total Protein: 7.8 g/dL (ref 6.5–8.1)

## 2023-11-09 LAB — LIPASE, BLOOD: Lipase: 39 U/L (ref 11–51)

## 2023-11-09 LAB — BASIC METABOLIC PANEL WITH GFR
Anion gap: 12 (ref 5–15)
BUN: 15 mg/dL (ref 6–20)
CO2: 25 mmol/L (ref 22–32)
Calcium: 9.3 mg/dL (ref 8.9–10.3)
Chloride: 102 mmol/L (ref 98–111)
Creatinine, Ser: 0.78 mg/dL (ref 0.44–1.00)
GFR, Estimated: >60 mL/min (ref >60)
Glucose, Bld: 171 mg/dL — ABNORMAL HIGH (ref 70–99)
Potassium: 3.6 mmol/L (ref 3.5–5.1)
Sodium: 139 mmol/L (ref 135–145)

## 2023-11-09 LAB — TROPONIN I (HIGH SENSITIVITY): Troponin I (High Sensitivity): 3 ng/L (ref <18)

## 2023-11-09 MED ORDER — MORPHINE SULFATE (PF) 4 MG/ML IV SOLN
4.0000 mg | Freq: Once | INTRAVENOUS | Status: AC
  Administered 2023-11-09: 4 mg via INTRAVENOUS
  Filled 2023-11-09: qty 1

## 2023-11-09 MED ORDER — SODIUM CHLORIDE 0.9 % IV BOLUS
1000.0000 mL | Freq: Once | INTRAVENOUS | Status: AC
  Administered 2023-11-09: 1000 mL via INTRAVENOUS

## 2023-11-09 MED ORDER — ONDANSETRON HCL 4 MG PO TABS: 4.0000 mg | ORAL_TABLET | Freq: Four times a day (QID) | ORAL | 0 refills | Status: AC | PRN

## 2023-11-09 MED ORDER — DICYCLOMINE HCL 20 MG PO TABS: 20.0000 mg | ORAL_TABLET | Freq: Four times a day (QID) | ORAL | 0 refills | Status: DC | PRN

## 2023-11-09 MED ORDER — IOHEXOL 300 MG/ML  SOLN
100.0000 mL | Freq: Once | INTRAMUSCULAR | Status: AC | PRN
  Administered 2023-11-09: 100 mL via INTRAVENOUS

## 2023-11-09 MED ORDER — ONDANSETRON HCL 4 MG/2ML IJ SOLN
4.0000 mg | Freq: Once | INTRAMUSCULAR | Status: AC
Start: 2023-11-09 — End: 2023-11-09
  Administered 2023-11-09: 4 mg via INTRAVENOUS
  Filled 2023-11-09: qty 2

## 2023-11-09 NOTE — ED Provider Notes (Signed)
 [facility] Provider Note    Event Date/Time   First MD Initiated Contact with Patient 11/09/23 1110     (approximate)   History   Abdominal Pain   HPI  Corrin Sieling is a 53 year old female with history of T2DM, HTN, endometriosis, fibroids presenting to the emergency department for evaluation of abdominal pain.  About 10 days ago, patient had onset of periumbilical pain described as a fullness sensation.  Associated nausea without vomiting.  Normal bowel movements.  Has multiple prior abdominal surgeries related to her endometriosis.  Did test positive for COVID on 8/24, had some initial upper respiratory symptoms that are now improved and abdominal pain started several days later.  She additionally reports that she has had palpitations and shortness of breath for several years, but does feel that these have been getting worse.  She has noticed that her abdominal pain does seem to be worse with exertion over the past several days.     Physical Exam   Triage Vital Signs: ED Triage Vitals  Encounter Vitals Group     BP 11/09/23 1026 (!) 160/77     Girls Systolic BP Percentile --      Girls Diastolic BP Percentile --      Boys Systolic BP Percentile --      Boys Diastolic BP Percentile --      Pulse Rate 11/09/23 1026 81     Resp 11/09/23 1026 17     Temp 11/09/23 1026 97.9 F (36.6 C)     Temp Source 11/09/23 1026 Oral     SpO2 11/09/23 1026 97 %     Weight 11/09/23 1027 278 lb (126.1 kg)     Height 11/09/23 1027 5' 2 (1.575 m)     Head Circumference --      Peak Flow --      Pain Score 11/09/23 1027 8     Pain Loc --      Pain Education --      Exclude from Growth Chart --     Most recent vital signs: Vitals:   11/09/23 1026 11/09/23 1130  BP: (!) 160/77   Pulse: 81   Resp: 17   Temp: 97.9 F (36.6 C)   SpO2: 97% 97%     General: Awake, interactive  CV:  Regular rate, good peripheral perfusion.  Resp:  Unlabored respirations,  lungs clear to auscultation Abd:  Nondistended, soft, generalized tenderness to palpation without rebound or guarding Neuro:  Symmetric facial movement, fluid speech   ED Results / Procedures / Treatments   Labs (all labs ordered are listed, but only abnormal results are displayed) Labs Reviewed  RESP PANEL BY RT-PCR (RSV, FLU A&B, COVID)  [id number] - Abnormal; Notable for the following components:      Result Value   SARS Coronavirus 2 by RT PCR POSITIVE (*)    All other components within normal limits  CBC WITH DIFFERENTIAL/PLATELET - Abnormal; Notable for the following components:   RBC 5.65 (*)    Hemoglobin 15.4 (*)    HCT 46.4 (*)    All other components within normal limits  BASIC METABOLIC PANEL WITH GFR - Abnormal; Notable for the following components:   Glucose, Bld 171 (*)    All other components within normal limits  HEPATIC FUNCTION PANEL  LIPASE, BLOOD  TROPONIN I (HIGH SENSITIVITY)     EKG EKG independently reviewed and interpreted by myself demonstrates:  EKG demonstrates sinus bradycardia rate of 59,  PR 148, cures 94, QTc 401, no acute ST changes  RADIOLOGY Imaging independently reviewed and interpreted by myself demonstrates:  CXR without focal consolidation  Formal Radiology Read:  CT ABDOMEN PELVIS W CONTRAST Result Date: 11/09/2023 EXAM: CT ABDOMEN AND PELVIS WITH CONTRAST 11/09/2023 12:54:48 PM TECHNIQUE: CT of the abdomen and pelvis was performed with the administration of intravenous contrast. Multiplanar reformatted images are provided for review. Automated exposure control, iterative reconstruction, and/or weight-based adjustment of the mA/kV was utilized to reduce the radiation dose to as low as reasonably achievable. COMPARISON: CT of the abdomen and pelvis dated 06/13/2021. CLINICAL HISTORY: Abdominal pain, acute, nonlocalized. Pt to ED via POV for c/o abd pain, fullness, and nausea x 10 days. Denies vomiting. States Dyspnea with exertion off and on for  a while. Tested positive for covid 08/24. FINDINGS: LOWER CHEST: No acute abnormality. LIVER: The liver is unremarkable. GALLBLADDER AND BILE DUCTS: Gallbladder is unremarkable. No biliary ductal dilatation. SPLEEN: No acute abnormality. PANCREAS: No acute abnormality. ADRENAL GLANDS: No acute abnormality. KIDNEYS, URETERS AND BLADDER: No stones in the kidneys or ureters. No hydronephrosis. No perinephric or periureteral stranding. Urinary bladder is unremarkable. GI AND BOWEL: Stomach demonstrates no acute abnormality. There is no bowel obstruction. PERITONEUM AND RETROPERITONEUM: No ascites. No free air. VASCULATURE: Aorta is normal in caliber. LYMPH NODES: No lymphadenopathy. REPRODUCTIVE ORGANS: Ovoid left uterine fibroid is again demonstrated and has decreased in size in the interim from approximately 4.7 x 3.5 cm to approximately 4.0 x 3.1 cm. BONES AND SOFT TISSUES: No acute osseous abnormality. No focal soft tissue abnormality. IMPRESSION: 1. No acute findings in the abdomen or pelvis. 2. Ovoid left uterine fibroid, decreased in size compared to prior study. Electronically signed by: Evalene Coho MD 11/09/2023 01:00 PM EDT RP Workstation: [id number]   DG Chest 2 View Result Date: 11/09/2023 CLINICAL DATA:  Shortness of breath.  Recent COVID infection EXAM: CHEST - 2 VIEW COMPARISON:  Chest radiograph dated 06/24/2017 FINDINGS: Normal lung volumes. No focal consolidations. No pleural effusion or pneumothorax. The heart size and mediastinal contours are within normal limits. No acute osseous abnormality. IMPRESSION: No active cardiopulmonary disease. Electronically Signed   By: Limin  Xu M.D.   On: 11/09/2023 10:53    PROCEDURES:  Critical Care performed: No  Procedures   MEDICATIONS ORDERED IN ED: Medications  sodium chloride  0.9 % bolus 1,000 mL (1,000 mLs Intravenous New Bag/Given 11/09/23 1213)  morphine  (PF) 4 MG/ML injection 4 mg (4 mg Intravenous Given 11/09/23 1212)  ondansetron   (ZOFRAN ) injection 4 mg (4 mg Intravenous Given 11/09/23 1212)  iohexol  (OMNIPAQUE ) 300 MG/ML solution 100 mL (100 mLs Intravenous Contrast Given 11/09/23 1248)     IMPRESSION / MDM / ASSESSMENT AND PLAN / ED COURSE  I reviewed the triage vital signs and the nursing notes.  Differential diagnosis includes, but is not limited to, colitis, appendicitis, pain related to endometriosis, sequela from recent viral infection, atypical ACS, arrhythmia  Patient's presentation is most consistent with acute presentation with potential threat to life or bodily function.  53 year old female presenting with periumbilical abdominal pain.  Stable vitals on presentation.  Labs with  CBC with normal white blood cell count, elevated hemoglobin possibly related to component of dehydration though not significantly changed from prior.  BMP without significant derangement.  COVID swab remains positive.  With abdominal pain will add on lipase and LFTs.  Does report some exertional component to her abdominal pain, so we will also obtain EKG and troponin to to  evaluate for atypical ACS.  CT abdomen pelvis ordered to further evaluate.  Ordered for symptomatic treatment with morphine , Zofran , IV fluids.   Lipase and LFTs within normal limits.  Negative troponin.  EKG without acute ischemic findings.  CT abdomen pelvis resulted without acute findings, did note uterine fibroid decreased in size from prior.  Patient reassessed and reports feeling improved.  Overall low suspicion emergent process.  With her reported progressive palpitations and shortness of breath, placed routine referral to cardiology.  Will DC with prescriptions for Zofran  and Bentyl  for abdominal symptoms.  Strict return precautions provided.  Patient discharged in stable condition.      FINAL CLINICAL IMPRESSION(S) / ED DIAGNOSES   Final diagnoses:  Periumbilical abdominal pain  Shortness of breath     Rx / DC Orders   ED Discharge Orders           Ordered    Ambulatory referral to Cardiology       Comments: If you have not heard from the Cardiology office within the next 72 hours please call [phone].   11/09/23 1317    dicyclomine  (BENTYL ) 20 MG tablet  Every 6 hours PRN        11/09/23 1318    ondansetron  (ZOFRAN ) 4 MG tablet  Every 6 hours PRN        11/09/23 1318             Note:  This document was prepared using Dragon voice recognition software and may include unintentional dictation errors.   Levander Slate, MD 11/09/23 1318

## 2023-11-09 NOTE — Discharge Instructions (Addendum)
 You were seen in the Emergency Department today for evaluation of your abdominal pain. Fortunately, your labs, urine test, and CT scan were overall reassuring against an emergency cause for your pain. Please follow-up with your primary care doctor for reevaluation.  I have also placed a referral to cardiology for your worsening palpitations and shortness of breath.  I sent a prescription for nausea medicine and anticraving medicine to your pharmacy.  Return to the ER for any new or worsening symptoms including worsening pain, inability to tolerate food or liquids, or any other new or concerning symptoms

## 2023-11-09 NOTE — ED Triage Notes (Signed)
 Pt to ED via POV for c/o abd pain, fullness, and nausea x 10 days. Denies vomiting. States Dyspnea with exertion off and on for a while. Tested positive for covid 08/24.

## 2023-11-26 ENCOUNTER — Ambulatory Visit: Admitting: Family Medicine

## 2023-11-29 ENCOUNTER — Encounter: Payer: Self-pay | Admitting: Family Medicine

## 2023-11-29 ENCOUNTER — Ambulatory Visit: Admitting: Family Medicine

## 2023-11-29 DIAGNOSIS — Z23 Encounter for immunization: Secondary | ICD-10-CM | POA: Diagnosis not present

## 2023-11-29 DIAGNOSIS — R0609 Other forms of dyspnea: Secondary | ICD-10-CM

## 2023-11-29 DIAGNOSIS — E785 Hyperlipidemia, unspecified: Secondary | ICD-10-CM

## 2023-11-29 DIAGNOSIS — J4540 Moderate persistent asthma, uncomplicated: Secondary | ICD-10-CM

## 2023-11-29 DIAGNOSIS — Z1159 Encounter for screening for other viral diseases: Secondary | ICD-10-CM

## 2023-11-29 DIAGNOSIS — E1169 Type 2 diabetes mellitus with other specified complication: Secondary | ICD-10-CM | POA: Diagnosis not present

## 2023-11-29 DIAGNOSIS — Z7984 Long term (current) use of oral hypoglycemic drugs: Secondary | ICD-10-CM

## 2023-11-29 DIAGNOSIS — T466X5A Adverse effect of antihyperlipidemic and antiarteriosclerotic drugs, initial encounter: Secondary | ICD-10-CM

## 2023-11-29 DIAGNOSIS — E538 Deficiency of other specified B group vitamins: Secondary | ICD-10-CM

## 2023-11-29 DIAGNOSIS — G720 Drug-induced myopathy: Secondary | ICD-10-CM

## 2023-11-29 DIAGNOSIS — E559 Vitamin D deficiency, unspecified: Secondary | ICD-10-CM

## 2023-11-29 DIAGNOSIS — M5416 Radiculopathy, lumbar region: Secondary | ICD-10-CM

## 2023-11-29 LAB — POCT GLYCOSYLATED HEMOGLOBIN (HGB A1C): Hemoglobin A1C: 6.9 % — AB (ref 4.0–5.6)

## 2023-11-29 MED ORDER — FARXIGA 10 MG PO TABS: 10.0000 mg | ORAL_TABLET | Freq: Every day | ORAL | 1 refills | Status: AC

## 2023-11-29 MED ORDER — BACLOFEN 10 MG PO TABS: 10.0000 mg | ORAL_TABLET | Freq: Every evening | ORAL | 0 refills | Status: AC | PRN

## 2023-11-29 MED ORDER — SEMAGLUTIDE (1 MG/DOSE) 4 MG/3ML ~~LOC~~ SOPN: 1.0000 mg | PEN_INJECTOR | SUBCUTANEOUS | 0 refills | Status: DC

## 2023-11-29 MED ORDER — CELECOXIB 100 MG PO CAPS
100.0000 mg | ORAL_CAPSULE | Freq: Two times a day (BID) | ORAL | 0 refills | Status: AC
Start: 2023-11-29 — End: ?

## 2023-11-29 NOTE — Progress Notes (Signed)
 Name: Kelsey Alvarez   MRN: [id number]    DOB: Mar 24, 1970   Date:11/29/2023       Progress Note  Subjective  Chief Complaint  Chief Complaint  Patient presents with   Medical Management of Chronic Issues   Discussed the use of AI scribe software for clinical note transcription with the patient, who gave verbal consent to proceed.  History of Present Illness Kelsey Alvarez is a 53 year old female with diabetes, dyslipidemia, and hypertension who presents for a regular follow-up visit.  She experiences worsening back pain, particularly when walking, with radiation down both outer upper legs causing numbness. Previous imaging, including an x-ray and a CT scan of the abdomen, were performed; the patient reports being told in the ER that her liver and adrenal glands were fine and that she did not have kidney stones. She is currently taking meloxicam , which she finds ineffective, and has tried gabapentin  without relief. She is concerned about weight gain and its impact on her back pain and overall health.  She experiences shortness of breath and palpitations during physical activities such as carrying groceries, with her heart rate reaching 150 to 180 bpm. These symptoms have been present for over four to five months. She has a history of sinus arrhythmia and has previously seen a cardiologist. No asthma-related symptoms are reported for her current shortness of breath, which she describes as pressure-related rather than asthma-related.  Diabetes is still controlled but her A1c has increased from 6.4 to 6.9. She experiences increased thirst and blurred vision, which she attributes to blood sugar spikes. She is currently taking Farxiga  and pioglitazone  for diabetes management. She has previously tried metformin  but discontinued it due to gastrointestinal side effects. She has also tried GLP-1 agonist but stopped because she didn't like not being able to eat as much, however she wants to lose weight and  is willing to try it again   Her asthma is well-controlled with Airsupra  and Symbicort . She reports no jitteriness or significant side effects from these medications.    Patient Active Problem List   Diagnosis Date Noted   Diabetic neuropathy (HCC) 02/22/2023   Asthma, well controlled 02/22/2023   Localized osteoarthritis of left knee 03/09/2022   Lumbar back pain with radiculopathy affecting left lower extremity 03/09/2022   Dyslipidemia associated with type 2 diabetes mellitus (HCC) 01/20/2018   Endometriosis 11/06/2017   Menorrhagia with irregular cycle 10/28/2017   Fibroid uterus 10/28/2017   Carpal tunnel syndrome on both sides 10/17/2017   B12 deficiency 04/27/2016   History of endometriosis 11/17/2015   Oligomenorrhea 10/12/2015   Benign essential HTN 12/28/2014   Cervical polyp 12/28/2014   Controlled type 2 diabetes mellitus with microalbuminuria (HCC) 12/28/2014   Edema 12/28/2014   Female infertility 12/28/2014   Gastro-esophageal reflux disease without esophagitis 12/28/2014   Morbid obesity (HCC) 12/28/2014   Perennial allergic rhinitis 12/28/2014   Intermittent tremor 12/28/2014   Vitamin D  deficiency 12/28/2014   Intermittent low back pain 12/28/2014   Left sciatic nerve pain 12/28/2014    Past Surgical History:  Procedure Laterality Date   APPENDECTOMY     cervical polyp removal     DILATION AND CURETTAGE OF UTERUS     HYSTEROSCOPY     HYSTEROSCOPY WITH D & C N/A 10/12/2015   Procedure: DILATATION AND CURETTAGE /HYSTEROSCOPY;  Surgeon: Garnette JONETTA Mace, MD;  Location: [facility];  Service: Gynecology;  Laterality: N/A;   LAPAROSCOPIC BILATERAL SALPINGO OOPHERECTOMY N/A 10/12/2015   Procedure: LAPAROSCOPIC RIGHT  SALPINGECTOMY, LEFT OOPHORECTOMY;  Surgeon: Garnette JONETTA Mace, MD;  Location: [facility];  Service: Gynecology;  Laterality: N/A;   LAPAROSCOPIC OVARIAN CYSTECTOMY Bilateral 10/12/2015   Procedure: LAPAROSCOPIC OVARIAN CYSTECTOMY;  Surgeon: Garnette JONETTA Mace,  MD;  Location: [facility];  Service: Gynecology;  Laterality: Bilateral;   OOPHORECTOMY     OVARIAN CYST REMOVAL      Family History  Problem Relation Age of Onset   Breast cancer Mother 45   Hypertension Mother    Diabetes Mother    CAD Mother    Cancer Mother 65       breast cancer   Heart attack Mother 44       Triple Bypass   Asthma Mother    Heart disease Mother    CAD Father    Hypertension Father    Arthritis Father    Thyroid disease Sister    Asthma Sister    Alzheimer's disease Maternal Grandmother    Alzheimer's disease Maternal Grandfather    Breast cancer Paternal Grandmother     Social History   Tobacco Use   Smoking status: Former    Current packs/day: 0.00    Average packs/day: 0.3 packs/day for 1 year (0.3 ttl pk-yrs)    Types: Cigarettes    Start date: 12/03/2001    Quit date: 12/28/2002    Years since quitting: 20.9   Smokeless tobacco: Never   Tobacco comments:    smoked 3 to 4 cigarrettes a day for a year  Substance Use Topics   Alcohol use: Yes    Comment: Maybe 2 a year     Current Outpatient Medications:    Albuterol -Budesonide  (AIRSUPRA ) 90-80 MCG/ACT AERO, Inhale 2 puffs into the lungs 4 (four) times daily as needed., Disp: 31.1 g, Rfl: 0   Alpha Lipoic Acid 200 MG CAPS, Take by mouth., Disp: , Rfl:    Bempedoic Acid-Ezetimibe  (NEXLIZET ) 180-10 MG TABS, Take 1 tablet by mouth daily at 12 noon. In place of pravastatin  and zetia , Disp: 90 tablet, Rfl: 1   budesonide -formoterol  (SYMBICORT ) 160-4.5 MCG/ACT inhaler, Inhale 2 puffs into the lungs 2 (two) times daily., Disp: 3 each, Rfl: 1   Cetirizine HCl (ZYRTEC PO), Take 10 mg by mouth daily., Disp: , Rfl:    Cholecalciferol (VITAMIN D ) 2000 UNITS tablet, Take 1 tablet by mouth daily., Disp: , Rfl:    FARXIGA  10 MG TABS tablet, Take 1 tablet (10 mg total) by mouth daily before breakfast., Disp: 90 tablet, Rfl: 1   fluticasone  (FLONASE ) 50 MCG/ACT nasal spray, Place 2 sprays into both nostrils  daily., Disp: 16 g, Rfl: 5   gabapentin  (NEURONTIN ) 100 MG capsule, Take 1 capsule (100 mg total) by mouth 3 (three) times daily. 100 mg morning and afternoon and two at night, Disp: 90 capsule, Rfl: 0   ipratropium-albuterol  (DUONEB) 0.5-2.5 (3) MG/3ML SOLN, Take 3 mLs by nebulization every 4 (four) hours as needed., Disp: 120 mL, Rfl: 0   meclizine (ANTIVERT) 12.5 MG tablet, Take 12.5-25 mg by mouth 3 (three) times daily as needed., Disp: , Rfl:    meloxicam  (MOBIC ) 7.5 MG tablet, Take 1 tablet (7.5 mg total) by mouth 2 (two) times daily., Disp: 180 tablet, Rfl: 0   montelukast  (SINGULAIR ) 10 MG tablet, Take 1 tablet (10 mg total) by mouth at bedtime. TAKE 1 TABLET BY MOUTH EVERYDAY AT BEDTIME, Disp: 90 tablet, Rfl: 1   naproxen  (NAPROSYN ) 500 MG tablet, Take 500 mg by mouth 2 (two) times daily as needed.,  Disp: , Rfl:    pioglitazone  (ACTOS ) 15 MG tablet, Take 1 tablet (15 mg total) by mouth daily., Disp: 90 tablet, Rfl: 1   valsartan -hydrochlorothiazide  (DIOVAN -HCT) 160-12.5 MG tablet, Take 1 tablet by mouth daily., Disp: 90 tablet, Rfl: 1   dicyclomine  (BENTYL ) 20 MG tablet, Take 1 tablet (20 mg total) by mouth every 6 (six) hours as needed for up to 7 days for spasms., Disp: 25 tablet, Rfl: 0   fluconazole  (DIFLUCAN ) 150 MG tablet, Take 1 tablet (150 mg total) by mouth every other day. (Patient not taking: Reported on 11/29/2023), Disp: 12 tablet, Rfl: 1  Allergies  Allergen Reactions   Fish Allergy Shortness Of Breath   Penicillins Anaphylaxis and Rash    Childhood allergy   Crestor  [Rosuvastatin ]     Headache    Adhesive [Tape] Rash    Paper tape ok to use.    I personally reviewed active problem list, medication list, allergies with the patient/caregiver today.   ROS  Ten systems reviewed and is negative except as mentioned in HPI    Objective Physical Exam CONSTITUTIONAL: Patient appears well-developed and well-nourished. No distress. HEENT: Head atraumatic, normocephalic,  neck supple. CARDIOVASCULAR: Normal rate, regular rhythm and normal heart sounds. No murmur heard. No BLE edema. PULMONARY: Effort normal and breath sounds normal. No respiratory distress. ABDOMINAL: There is no tenderness or distention. MUSCULOSKELETAL: pain when getting up from sitting Lordosis present. Pain on back extension. Tightness on forward flexion. Soreness on lateral flexion. PSYCHIATRIC: Patient has a normal mood and affect. Behavior is normal. Judgment and thought content normal.  Vitals:   11/29/23 1533  BP: 126/70  Pulse: 74  Resp: 16  SpO2: 98%  Weight: 280 lb (127 kg)  Height: 5' 2 (1.575 m)    Body mass index is 51.21 kg/m.  Recent Results (from the past 2160 hours)  CBC with Differential     Status: Abnormal   Collection Time: 11/09/23 10:30 AM  Result Value Ref Range   WBC 7.2 4.0 - 10.5 K/uL   RBC 5.65 (H) 3.87 - 5.11 MIL/uL   Hemoglobin 15.4 (H) 12.0 - 15.0 g/dL   HCT 53.5 (H) 63.9 - 53.9 %   MCV 82.1 80.0 - 100.0 fL   MCH 27.3 26.0 - 34.0 pg   MCHC 33.2 30.0 - 36.0 g/dL   RDW 86.6 88.4 - 84.4 %   Platelets 278 150 - 400 K/uL   nRBC 0.0 0.0 - 0.2 %   Neutrophils Relative % 51 %   Neutro Abs 3.7 1.7 - 7.7 K/uL   Lymphocytes Relative 38 %   Lymphs Abs 2.7 0.7 - 4.0 K/uL   Monocytes Relative 7 %   Monocytes Absolute 0.5 0.1 - 1.0 K/uL   Eosinophils Relative 2 %   Eosinophils Absolute 0.1 0.0 - 0.5 K/uL   Basophils Relative 1 %   Basophils Absolute 0.1 0.0 - 0.1 K/uL   Immature Granulocytes 1 %   Abs Immature Granulocytes 0.05 0.00 - 0.07 K/uL    Comment: Performed at [facility], [address] [place], [place] [address]  Basic metabolic panel     Status: Abnormal   Collection Time: 11/09/23 10:30 AM  Result Value Ref Range   Sodium 139 135 - 145 mmol/L   Potassium 3.6 3.5 - 5.1 mmol/L   Chloride 102 98 - 111 mmol/L   CO2 25 22 - 32 mmol/L   Glucose, Bld 171 (H) 70 - 99 mg/dL    Comment:  Glucose reference range applies only to  samples taken after fasting for at least 8 hours.   BUN 15 6 - 20 mg/dL   Creatinine, Ser 9.21 0.44 - 1.00 mg/dL   Calcium  9.3 8.9 - 10.3 mg/dL   GFR, Estimated >39 >39 mL/min    Comment: (NOTE) Calculated using the CKD-EPI Creatinine Equation (2021)    Anion gap 12 5 - 15    Comment: Performed at [facility], [address] [place], [place] [address]  Resp panel by RT-PCR (RSV, Flu A&B, Covid) Anterior Nasal Swab     Status: Abnormal   Collection Time: 11/09/23 10:30 AM   Specimen: Anterior Nasal Swab  Result Value Ref Range   SARS Coronavirus 2 by RT PCR POSITIVE (A) NEGATIVE    Comment: (NOTE) SARS-CoV-2 target nucleic acids are DETECTED.  The SARS-CoV-2 RNA is generally detectable in upper respiratory specimens during the acute phase of infection. Positive results are indicative of the presence of the identified virus, but do not rule out bacterial infection or co-infection with other pathogens not detected by the test. Clinical correlation with patient history and other diagnostic information is necessary to determine patient infection status. The expected result is Negative.  Fact Sheet for Patients: BloggerCourse.com  Fact Sheet for Healthcare Providers: SeriousBroker.it  This test is not yet approved or cleared by the [place]  FDA and  has been authorized for detection and/or diagnosis of SARS-CoV-2 by FDA under an Emergency Use Authorization (EUA).  This EUA will remain in effect (meaning this test can be used) for the duration of  the COVID-19 declaration under Section 564(b)(1) of the A ct, 21 U.S.C. section 360bbb-3(b)(1), unless the authorization is terminated or revoked sooner.     Influenza A by PCR NEGATIVE NEGATIVE   Influenza B by PCR NEGATIVE NEGATIVE    Comment: (NOTE) The Xpert Xpress SARS-CoV-2/FLU/RSV plus assay is intended as an aid in the diagnosis of influenza from Nasopharyngeal  swab specimens and should not be used as a sole basis for treatment. Nasal washings and aspirates are unacceptable for Xpert Xpress SARS-CoV-2/FLU/RSV testing.  Fact Sheet for Patients: BloggerCourse.com  Fact Sheet for Healthcare Providers: SeriousBroker.it  This test is not yet approved or cleared by the [place]  FDA and has been authorized for detection and/or diagnosis of SARS-CoV-2 by FDA under an Emergency Use Authorization (EUA). This EUA will remain in effect (meaning this test can be used) for the duration of the COVID-19 declaration under Section 564(b)(1) of the Act, 21 U.S.C. section 360bbb-3(b)(1), unless the authorization is terminated or revoked.     Resp Syncytial Virus by PCR NEGATIVE NEGATIVE    Comment: (NOTE) Fact Sheet for Patients: BloggerCourse.com  Fact Sheet for Healthcare Providers: SeriousBroker.it  This test is not yet approved or cleared by the [place]  FDA and has been authorized for detection and/or diagnosis of SARS-CoV-2 by FDA under an Emergency Use Authorization (EUA). This EUA will remain in effect (meaning this test can be used) for the duration of the COVID-19 declaration under Section 564(b)(1) of the Act, 21 U.S.C. section 360bbb-3(b)(1), unless the authorization is terminated or revoked.  Performed at [facility], [address] [place], [place] [address]   Hepatic function panel     Status: None   Collection Time: 11/09/23 10:30 AM  Result Value Ref Range   Total Protein 7.8 6.5 - 8.1 g/dL   Albumin 4.0 3.5 - 5.0 g/dL   AST 37 15 - 41 U/L  ALT 39 0 - 44 U/L   Alkaline Phosphatase 46 38 - 126 U/L   Total Bilirubin 0.8 0.0 - 1.2 mg/dL   Bilirubin, Direct 0.2 0.0 - 0.2 mg/dL   Indirect Bilirubin 0.6 0.3 - 0.9 mg/dL    Comment: Performed at [facility], [address] [place], [place] [address]   Lipase, blood     Status: None   Collection Time: 11/09/23 10:30 AM  Result Value Ref Range   Lipase 39 11 - 51 U/L    Comment: Performed at [facility], [address] [place], [place] [address]  Troponin I (High Sensitivity)     Status: None   Collection Time: 11/09/23 10:30 AM  Result Value Ref Range   Troponin I (High Sensitivity) 3 <18 ng/L    Comment: (NOTE) Elevated high sensitivity troponin I (hsTnI) values and significant  changes across serial measurements may suggest ACS but many other  chronic and acute conditions are known to elevate hsTnI results.  Refer to the Links section for chest pain algorithms and additional  guidance. Performed at [facility], [address] [place], [place] [address]   POCT glycosylated hemoglobin (Hb A1C)     Status: Abnormal   Collection Time: 11/29/23  3:38 PM  Result Value Ref Range   Hemoglobin A1C 6.9 (A) 4.0 - 5.6 %   HbA1c POC (<> result, manual entry)     HbA1c, POC (prediabetic range)     HbA1c, POC (controlled diabetic range)      Diabetic Foot Exam:     PHQ2/9:    11/29/2023    3:26 PM 07/23/2023    3:28 PM 04/09/2023    1:03 PM 02/22/2023    1:31 PM 11/20/2022    8:07 AM  Depression screen PHQ 2/9  Decreased Interest 0 0 1 0 0  Down, Depressed, Hopeless 0 0 1 0 0  PHQ - 2 Score 0 0 2 0 0  Altered sleeping  0 1  2  Tired, decreased energy  0 1  1  Change in appetite  0 0  0  Feeling bad or failure about yourself   0 0  0  Trouble concentrating  0 0  0  Moving slowly or fidgety/restless  0 0  0  Suicidal thoughts  0 0  0  PHQ-9 Score  0 4  3  Difficult doing work/chores  Not difficult at all Somewhat difficult      phq 9 is negative  Fall Risk:    11/29/2023    3:26 PM 07/23/2023    3:28 PM 04/09/2023    1:03 PM 02/22/2023    1:30 PM 11/20/2022    8:07 AM  Fall Risk   Falls in the past year? 0 0 0 0 0  Number falls in past yr: 0 0 0 0 0  Injury with Fall? 0 0 0 0 0  Risk for fall  due to : No Fall Risks No Fall Risks No Fall Risks No Fall Risks No Fall Risks  Follow up Falls evaluation completed Falls evaluation completed Falls prevention discussed;Education provided;Falls evaluation completed Falls prevention discussed;Education provided;Falls evaluation completed Falls prevention discussed      Assessment & Plan Type 2 diabetes mellitus with associated HTN and dyslipidemia A1c increased to 6.9, indicating worsening glycemic control. Symptoms suggest blood glucose fluctuations. Previous intolerance to metformin  and GLP-1 agonists noted. - Continue Farxiga  10 mg daily. - Continue pioglitazone  15 mg daily. - Start  Ozempic  at 0.25 mg weekly, increase to 0.5 mg after two weeks if tolerated. - Educate on dietary modifications to manage nausea with Ozempic . - Order fasting labs including lipid panel, comprehensive metabolic panel, and vitamin D .  Obesity/Morbid Obesity contributing to chronic back pain and exertional dyspnea. Discussed weight loss benefits on symptoms and overall health. - Start Ozempic  at 0.25 mg weekly, increase to 0.5 mg after two weeks if tolerated. - Educate on dietary modifications to manage nausea with Ozempic . - Encourage weight loss to alleviate symptoms of back pain and improve overall health.  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 Previous treatments ineffective. Discussed potential referral to pain clinic. - Discontinue gabapentin . - Start Celebrex  for pain management. - Prescribe baclofen  QHS for muscle spasms. - Consider referral to pain clinic for further evaluation and possible injections. - Encourage weight loss to alleviate back pain. - Advise on physical activity and posture modifications to reduce pain.  Exertional dyspnea and palpitations, evaluation for cardiac etiology Exertional dyspnea and palpitations with chest tightness, worsening with activity. Previous sinus  arrhythmia noted. - Refer to cardiologist for evaluation of exertional dyspnea and palpitations. - Advise on emergency protocol if chest tightness persists beyond five minutes.  Asthma Asthma well-controlled with current medication regimen. - Continue Airsupra  as needed. - Continue Symbicort  twice daily.  Hypertension Hypertension well-controlled with current medication regimen. - Continue valsartan  HCTZ 160/12.5 mg daily.  Dyslipidemia Dyslipidemia managed with Nexlizet . No recent cholesterol check. - Continue Nexlizet . - Order fasting lipid panel.

## 2023-12-13 ENCOUNTER — Ambulatory Visit: Payer: Self-pay | Admitting: Family Medicine

## 2023-12-13 LAB — LIPID PANEL
Cholesterol: 189 mg/dL (ref <200)
HDL: 44 mg/dL — ABNORMAL LOW (ref >=50)
LDL Cholesterol (Calc): 113 mg/dL — ABNORMAL HIGH
Non-HDL Cholesterol (Calc): 145 mg/dL — ABNORMAL HIGH (ref <130)
Total CHOL/HDL Ratio: 4.3 (calc) (ref <5.0)
Triglycerides: 207 mg/dL — ABNORMAL HIGH (ref <150)

## 2023-12-13 LAB — COMPREHENSIVE METABOLIC PANEL WITH GFR
AG Ratio: 1.4 (calc) (ref 1.0–2.5)
ALT: 40 U/L — ABNORMAL HIGH (ref 6–29)
AST: 29 U/L (ref 10–35)
Albumin: 4.2 g/dL (ref 3.6–5.1)
Alkaline phosphatase (APISO): 53 U/L (ref 37–153)
BUN: 11 mg/dL (ref 7–25)
CO2: 30 mmol/L (ref 20–32)
Calcium: 9.5 mg/dL (ref 8.6–10.4)
Chloride: 99 mmol/L (ref 98–110)
Creat: 0.78 mg/dL (ref 0.50–1.03)
Globulin: 3.1 g/dL (ref 1.9–3.7)
Glucose, Bld: 125 mg/dL — ABNORMAL HIGH (ref 65–99)
Potassium: 3.8 mmol/L (ref 3.5–5.3)
Sodium: 139 mmol/L (ref 135–146)
Total Bilirubin: 0.6 mg/dL (ref 0.2–1.2)
Total Protein: 7.3 g/dL (ref 6.1–8.1)
eGFR: 92 mL/min/1.73m2 (ref >=60)

## 2023-12-13 LAB — B12 AND FOLATE PANEL
Folate: >24 ng/mL
Vitamin B-12: 796 pg/mL (ref 200–1100)

## 2023-12-13 LAB — MICROALBUMIN / CREATININE URINE RATIO
Creatinine, Urine: 145 mg/dL (ref 20–275)
Microalb Creat Ratio: 6 mg/g{creat} (ref <30)
Microalb, Ur: 0.9 mg/dL

## 2023-12-13 LAB — VITAMIN D 25 HYDROXY (VIT D DEFICIENCY, FRACTURES): Vit D, 25-Hydroxy: 35 ng/mL (ref 30–100)

## 2023-12-22 ENCOUNTER — Other Ambulatory Visit: Payer: Self-pay | Admitting: Family Medicine

## 2023-12-22 DIAGNOSIS — J4540 Moderate persistent asthma, uncomplicated: Secondary | ICD-10-CM

## 2023-12-26 ENCOUNTER — Ambulatory Visit: Admitting: Family Medicine

## 2024-01-07 ENCOUNTER — Ambulatory Visit: Admitting: Family Medicine

## 2024-01-08 ENCOUNTER — Ambulatory Visit

## 2024-01-08 ENCOUNTER — Ambulatory Visit: Attending: Internal Medicine | Admitting: Internal Medicine

## 2024-01-08 ENCOUNTER — Encounter: Payer: Self-pay | Admitting: Internal Medicine

## 2024-01-08 VITALS — BP 136/76 | HR 78 | Ht 62.0 in | Wt 279.4 lb

## 2024-01-08 DIAGNOSIS — E1169 Type 2 diabetes mellitus with other specified complication: Secondary | ICD-10-CM

## 2024-01-08 DIAGNOSIS — I10 Essential (primary) hypertension: Secondary | ICD-10-CM

## 2024-01-08 DIAGNOSIS — R002 Palpitations: Secondary | ICD-10-CM

## 2024-01-08 DIAGNOSIS — R079 Chest pain, unspecified: Secondary | ICD-10-CM

## 2024-01-08 DIAGNOSIS — E785 Hyperlipidemia, unspecified: Secondary | ICD-10-CM

## 2024-01-08 MED ORDER — ASPIRIN 81 MG PO TBEC: 81.0000 mg | DELAYED_RELEASE_TABLET | Freq: Every day | ORAL | Status: AC

## 2024-01-08 NOTE — Patient Instructions (Addendum)
 Medication Instructions:  Your physician recommends the following medication changes.  START TAKING: Aspirin  81 mg by mouth daily   *If you need a refill on your cardiac medications before your next appointment, please call your pharmacy*  Lab Work: No labs ordered today    Testing/Procedures:    Please report to Radiology at [facility] Main Entrance, medical mall, 30 mins prior to your test.  [address]  [place], [place]  How to Prepare for Your Cardiac PET/CT Stress Test:  Nothing to eat or drink, except water, 3 hours prior to arrival time.  NO caffeine/decaffeinated products, or chocolate 12 hours prior to arrival. (Please note decaffeinated beverages (teas/coffees) still contain caffeine).  If you have caffeine within 12 hours prior, the test will need to be rescheduled.  Medication instructions: Do not take erectile dysfunction medications for 72 hours prior to test (sildenafil, tadalafil) Do not take nitrates (isosorbide mononitrate, Ranexa) the day before or day of test Do not take tamsulosin the day before or morning of test Hold theophylline containing medications for 12 hours. Hold Dipyridamole 48 hours prior to the test.  Diabetic Preparation: If able to eat breakfast prior to 3 hour fasting, you may take all medications, including your insulin . Do not worry if you miss your breakfast dose of insulin  - start at your next meal. If you do not eat prior to 3 hour fast-Hold all diabetes (oral and insulin ) medications. Patients who wear a continuous glucose monitor MUST remove the device prior to scanning.  You may take your remaining medications with water.  NO perfume, cologne or lotion on chest or abdomen area. FEMALES - Please avoid wearing dresses to this appointment.  Total time is 1 to 2 hours; you may want to bring reading material for the waiting time.  IF YOU THINK YOU MAY BE PREGNANT, OR ARE NURSING PLEASE INFORM THE  TECHNOLOGIST.  In preparation for your appointment, medication and supplies will be purchased.  Appointment availability is limited, so if you need to cancel or reschedule, please call the Radiology Department Scheduler at [phone] 24 hours in advance to avoid a cancellation fee of $100.00  What to Expect When you Arrive:  Once you arrive and check in for your appointment, you will be taken to a preparation room within the Radiology Department.  A technologist or Nurse will obtain your medical history, verify that you are correctly prepped for the exam, and explain the procedure.  Afterwards, an IV will be started in your arm and electrodes will be placed on your skin for EKG monitoring during the stress portion of the exam. Then you will be escorted to the PET/CT scanner.  There, staff will get you positioned on the scanner and obtain a blood pressure and EKG.  During the exam, you will continue to be connected to the EKG and blood pressure machines.  A small, safe amount of a radioactive tracer will be injected in your IV to obtain a series of pictures of your heart along with an injection of a stress agent.    After your Exam:  It is recommended that you eat a meal and drink a caffeinated beverage to counter act any effects of the stress agent.  Drink plenty of fluids for the remainder of the day and urinate frequently for the first couple of hours after the exam.  Your doctor will inform you of your test results within 7-10 business days.  For more information and frequently asked questions,  please visit our website: [email or web address]  For questions about your test or how to prepare for your test, please call: Cardiac Imaging Nurse Navigators Office: [phone]   ZIO XT- Long Term Monitor Instructions  Your physician has requested you wear a ZIO patch monitor for 14 days.  This is a single patch monitor. [employer] supplies one patch monitor per enrollment. Additional  stickers are not available. Please do not apply patch if you will be having a Nuclear Stress Test, Echocardiogram, Cardiac CT, MRI, or Chest Xray during the period you would be wearing the monitor. The patch cannot be worn during these tests. You cannot remove and re-apply the ZIO XT patch monitor.  Your ZIO patch monitor will be mailed 3 day USPS to your address on file. It may take 3-5 days to receive your monitor after you have been enrolled. Once you have received your monitor, please review the enclosed instructions. Your monitor has already been registered assigning a specific monitor serial number to you.  Billing and Patient Assistance Program Information  We have supplied [employer] with any of your insurance information on file for billing purposes.  [employer] offers a sliding scale Patient Assistance Program for patients that do not have insurance, or whose insurance does not completely cover the cost of the ZIO monitor.  You must apply for the Patient Assistance Program to qualify for this discounted rate.  To apply, please call [employer] at [phone], select option 4, select option 2, ask to apply for Patient Assistance Program. Meredeth will ask your household income, and how many people are in your household. They will quote your out-of-pocket cost based on that information. [employer] will also be able to set up a 13-month, interest-free payment plan if needed.  Applying the monitor   Shave hair from upper left chest.  Hold abrader disc by orange tab. Rub abrader in 40 strokes over the upper left chest as indicated in your monitor instructions.  Clean area with 4 enclosed alcohol pads. Let dry.  Apply patch as indicated in monitor instructions. Patch will be placed under collarbone on left side of chest with arrow pointing upward.  Rub patch adhesive wings for 2 minutes. Remove white label marked 1. Remove the white label marked 2. Rub patch adhesive wings for 2 additional minutes.  While  looking in a mirror, press and release button in center of patch. A small green light will flash 3-4 times. This will be your only indicator that the monitor has been turned on.  Do not shower for the first 24 hours. You may shower after the first 24 hours.  Press the button if you feel a symptom. You will hear a small click. Record Date, Time and Symptom in the Patient Logbook.  When you are ready to remove the patch, follow instructions on the last 2 pages of Patient Logbook.  Stick patch monitor into the tabs at the bottom of the return box.  Place Patient Logbook in the blue and white box. Use locking tab on box and tape box closed securely. The blue and white box has prepaid postage on it. Please place it in the mailbox as soon as possible. Your physician should have your test results approximately 7-14 days after the monitor has been mailed back to [facility].  Call [facility] Customer Care at [phone] if you have questions regarding your ZIO XT patch monitor.  Call them immediately if you see an orange light blinking on your monitor.  If your monitor falls off  in less than 4 days, contact our Monitor department at [phone].  If your monitor becomes loose or falls off after 4 days call [employer] at [phone] for suggestions on securing your monitor.  Follow-Up: At [facility], you and your health needs are our priority.  As part of our continuing mission to provide you with exceptional heart care, our providers are all part of one team.  This team includes your primary Cardiologist (physician) and Advanced Practice Providers or APPs (Physician Assistants and Nurse Practitioners) who all work together to provide you with the care you need, when you need it.  Your next appointment:   6 week(s)  Provider:   You may see Lonni Hanson, MD or one of the following Advanced Practice Providers on your designated Care Team:   Lonni Meager, NP Lesley Maffucci,  PA-C Bernardino Bring, PA-C Cadence [place], PA-C Tylene Lunch, NP Barnie Hila, NP

## 2024-01-08 NOTE — Progress Notes (Unsigned)
 Cardiology Office Note:  .   Date:  01/10/2024  ID:  Kelsey Alvarez, DOB 1970-04-18, MRN [id number] PCP: Sowles, Krichna, MD  [facility] Providers Cardiologist:  New     History of Present Illness: .   Kelsey Alvarez is a 52 y.o. female with history of hypertension, hyperlipidemia, type 2 diabetes mellitus, and morbid obesity, referred for evaluation of chest pain and exertional dyspnea.  She was previously seen in our practice by Dr. Darliss for palpitations, having last seen him in 06/2020.  Event monitor at that time showed predominantly sinus rhythm with rare PACs and PVCs.  No significant arrhythmia was identified.  No further workup was pursued.  She presented to the [facility] emergency department with abdominal pain in early September.  She also endorsed palpitations and shortness of breath at that time.  EKG showed sinus bradycardia with poor R wave progression and nonspecific ST segment changes, though evaluation was limited by baseline wander.  She tested positive for COVID-19.  High-sensitivity troponin I was negative x 1.  Today, Ms. Kelsey Alvarez remains concerned about chest pressure and palpitations.  The palpitations happen randomly, sometimes accompanying her chest pain.  They happen about once or twice a week, lasting a few minutes at a time.  The chest pressure is predominantly with activity and began about 5 months ago.  She notes some associated shortness of breath.  She also notes that her heart rate has jumped up to 187 bpm while carrying in groceries.  When at rest, it drops into the 50s.  She has not had any lightheadedness or syncope.  Chronic dependent edema is stable.  She just began semaglutide  in an effort to help lose weight.  ROS: See HPI  Studies Reviewed: SABRA   EKG Interpretation Date/Time:  Wednesday January 08 2024 15:13:59 EST Ventricular Rate:  78 PR Interval:  154 QRS Duration:  92 QT Interval:  380 QTC Calculation: 433 R Axis:   -3  Text  Interpretation: Normal sinus rhythm Cannot rule out Inferior infarct (cited on or before 01-Dec-2013) Cannot rule out Anterior infarct (cited on or before 01-Dec-2013) Abnormal ECG When compared with ECG of 09-Nov-2023 12:01, HEART RATE has increased Otherwise no significant change Confirmed by Erique Kaser [id number]) on 01/10/2024 11:33:46 AM    14-day event monitor (05/19/2020): Predominantly sinus rhythm with rare PACs and PVCs.  No significant arrhythmia.  Risk Assessment/Calculations:           Physical Exam:   VS:  BP 136/76 (BP Location: Left Wrist, Patient Position: Sitting, Cuff Size: Normal)   Pulse 78 Comment: 81 oximeter  Ht 5' 2 (1.575 m)   Wt 279 lb 6.4 oz (126.7 kg)   LMP 03/16/2022 (Approximate)   SpO2 98%   BMI 51.10 kg/m    Wt Readings from Last 3 Encounters:  01/08/24 279 lb 6.4 oz (126.7 kg)  11/29/23 280 lb (127 kg)  11/09/23 278 lb (126.1 kg)    General:  NAD. Neck: No gross JVD, though body habitus limits evaluation. Lungs: Clear to auscultation bilaterally without wheezes or crackles. Heart: Regular rate and rhythm without murmurs, rubs, or gallops. Abdomen: Soft, nontender, nondistended. Extremities: Trace pretibial edema.  ASSESSMENT AND PLAN: .    Chest pain: Kelsey Alvarez reports exertional chest pain over the last 5 months with associated shortness of breath and intermittent palpitations.  Her EKG shows poor R wave progression and possible inferior Q waves, though her body habitus and lead positioning may be affecting this.  Cardiac  risk factors include hypertension, hyperlipidemia, diabetes mellitus, and morbid obesity.  We have discussed further evaluation options and have agreed to perform a myocardial PET/CT.  Pending evaluation, I have recommended initiation of aspirin  81 mg daily.  Palpitations: Palpitations have been an issue off and on for years but seem to have recurred just over the last 1 to 2 years.  They sometimes accompany the aforementioned  chest pain.  We will plan to repeat a 14-day event monitor for further characterization.  Hypertension: Blood pressure mildly elevated today (goal less than 130/80).  We have agreed to defer medication changes for now.  Hopefully weight loss associated with recent addition of semaglutide  will help with her blood pressure.  Sodium restriction encouraged.  Hyperlipidemia associated with type 2 diabetes mellitus: Continue Nexlizet  and ongoing diabetes management per Dr. Glenard.  Morbid obesity: BMI > 50 with multiple comorbidities.  Weight loss encouraged through diet and exercise as tolerated and addition of semaglutide .    Informed Consent   Shared Decision Making/Informed Consent The risks [chest pain, shortness of breath, cardiac arrhythmias, dizziness, blood pressure fluctuations, myocardial infarction, stroke/transient ischemic attack, nausea, vomiting, allergic reaction, radiation exposure, metallic taste sensation and life-threatening complications (estimated to be 1 in 10,000)], benefits (risk stratification, diagnosing coronary artery disease, treatment guidance) and alternatives of a cardiac PET stress test were discussed in detail with Kelsey Alvarez and she agrees to proceed.     Dispo: Return to clinic in 6 weeks.  Signed, Lonni Hanson, MD

## 2024-01-10 ENCOUNTER — Encounter: Payer: Self-pay | Admitting: Internal Medicine

## 2024-01-10 DIAGNOSIS — R079 Chest pain, unspecified: Secondary | ICD-10-CM | POA: Insufficient documentation

## 2024-01-10 DIAGNOSIS — R002 Palpitations: Secondary | ICD-10-CM | POA: Insufficient documentation

## 2024-01-17 ENCOUNTER — Other Ambulatory Visit: Payer: Self-pay | Admitting: Family Medicine

## 2024-01-17 DIAGNOSIS — J4540 Moderate persistent asthma, uncomplicated: Secondary | ICD-10-CM

## 2024-01-17 DIAGNOSIS — E1169 Type 2 diabetes mellitus with other specified complication: Secondary | ICD-10-CM

## 2024-01-19 NOTE — Telephone Encounter (Signed)
 Requested Prescriptions  Pending Prescriptions Disp Refills   pioglitazone  (ACTOS ) 15 MG tablet [Pharmacy Med Name: PIOGLITAZONE  HCL 15 MG TABLET] 90 tablet 1    Sig: TAKE 1 TABLET (15 MG TOTAL) BY MOUTH DAILY.     Endocrinology:  Diabetes - Glitazones - pioglitazone  Passed - 01/19/2024 11:51 AM      Passed - HBA1C is between 0 and 7.9 and within 180 days    Hemoglobin A1C  Date Value Ref Range Status  11/29/2023 6.9 (A) 4.0 - 5.6 % Final   HbA1c, POC (controlled diabetic range)  Date Value Ref Range Status  04/25/2018 6.6 0.0 - 7.0 % Final   Hgb A1c MFr Bld  Date Value Ref Range Status  11/20/2022 6.8 (H) <5.7 % of total Hgb Final    Comment:    For someone without known diabetes, a hemoglobin A1c value of 6.5% or greater indicates that they may have  diabetes and this should be confirmed with a follow-up  test. . For someone with known diabetes, a value <7% indicates  that their diabetes is well controlled and a value  greater than or equal to 7% indicates suboptimal  control. A1c targets should be individualized based on  duration of diabetes, age, comorbid conditions, and  other considerations. . Currently, no consensus exists regarding use of hemoglobin A1c for diagnosis of diabetes for children. SABRA Amy - Valid encounter within last 6 months    Recent Outpatient Visits           1 month ago Morbid obesity [facility])   [place] [facility] Cubero, Dorette, MD   6 months ago Dyslipidemia associated with type 2 diabetes mellitus [facility])   [place] [facility] Glenard Doret[place], MD   9 months ago Well adult exam   [facility] Glenard Doret[place], MD       Future Appointments             In 4 weeks Franchester, Cadence H, PA-C [place] [facility] [place]   In 2 months Sowles, Krichna, MD [facility], [place]             montelukast  (SINGULAIR ) 10 MG tablet  [Pharmacy Med Name: MONTELUKAST  SOD 10 MG TABLET] 90 tablet 1    Sig: TAKE 1 TABLET (10 MG TOTAL) BY MOUTH AT BEDTIME. TAKE 1 TABLET BY MOUTH EVERYDAY AT BEDTIME     Pulmonology:  Leukotriene Inhibitors Passed - 01/19/2024 11:51 AM      Passed - Valid encounter within last 12 months    Recent Outpatient Visits           1 month ago Morbid obesity [facility])   [place] [facility] [place], Dorette, MD   6 months ago Dyslipidemia associated with type 2 diabetes mellitus [facility])   [place] [facility] Glenard Doret[place], MD   9 months ago Well adult exam   [facility] Sowles, Krichna, MD       Future Appointments             In 4 weeks Franchester, Cadence H, PA-C [place] [facility] [place]   In 2 months Sowles, Krichna, MD [facility], [place]

## 2024-01-21 ENCOUNTER — Encounter (HOSPITAL_COMMUNITY): Payer: Self-pay

## 2024-01-23 ENCOUNTER — Other Ambulatory Visit: Payer: Self-pay | Admitting: Internal Medicine

## 2024-01-23 ENCOUNTER — Ambulatory Visit
Admission: RE | Admit: 2024-01-23 | Discharge: 2024-01-23 | Disposition: A | Discharge status: Home / Self Care | Source: Ambulatory Visit | Attending: Internal Medicine | Admitting: Internal Medicine

## 2024-01-23 DIAGNOSIS — R079 Chest pain, unspecified: Secondary | ICD-10-CM | POA: Insufficient documentation

## 2024-01-23 DIAGNOSIS — I2510 Atherosclerotic heart disease of native coronary artery without angina pectoris: Secondary | ICD-10-CM | POA: Diagnosis not present

## 2024-01-23 MED ORDER — RUBIDIUM RB82 GENERATOR (RUBYFILL)
25.0000 | PACK | Freq: Once | INTRAVENOUS | Status: AC
  Administered 2024-01-23: 24.28 via INTRAVENOUS

## 2024-01-23 MED ORDER — RUBIDIUM RB82 GENERATOR (RUBYFILL)
25.0000 | PACK | Freq: Once | INTRAVENOUS | Status: AC
  Administered 2024-01-23: 24.22 via INTRAVENOUS

## 2024-01-23 MED ORDER — REGADENOSON 0.4 MG/5ML IV SOLN
0.4000 mg | Freq: Once | INTRAVENOUS | Status: AC
  Administered 2024-01-23: 0.4 mg via INTRAVENOUS
  Filled 2024-01-23: qty 5

## 2024-01-23 MED ORDER — REGADENOSON 0.4 MG/5ML IV SOLN
INTRAVENOUS | Status: AC
  Filled 2024-01-23: qty 5

## 2024-01-24 LAB — NM PET CT CARDIAC PERFUSION MULTI W/ABSOLUTE BLOODFLOW
MBFR: 2.01
Nuc Rest EF: 76 %
Nuc Stress EF: 79 %
Peak HR: 95 {beats}/min
Rest HR: 72 {beats}/min
Rest MBF: 1.15 ml/g/min
Rest Nuclear Isotope Dose: 24.2 mCi
SRS: 0
SSS: 2
ST Depression (mm): 0 mm
Stress MBF: 2.31 ml/g/min
Stress Nuclear Isotope Dose: 24.3 mCi
TID: 1.12

## 2024-01-27 ENCOUNTER — Ambulatory Visit: Payer: Self-pay | Admitting: Internal Medicine

## 2024-01-29 ENCOUNTER — Other Ambulatory Visit: Payer: Self-pay | Admitting: Family Medicine

## 2024-01-29 DIAGNOSIS — I10 Essential (primary) hypertension: Secondary | ICD-10-CM

## 2024-01-31 NOTE — Telephone Encounter (Signed)
 Requested Prescriptions  Pending Prescriptions Disp Refills   valsartan -hydrochlorothiazide  (DIOVAN -HCT) 160-12.5 MG tablet [Pharmacy Med Name: VALSARTAN -HCTZ 160-12.5 MG TAB] 90 tablet 0    Sig: TAKE 1 TABLET BY MOUTH EVERY DAY     Cardiovascular: ARB + Diuretic Combos Passed - 01/31/2024  3:47 PM      Passed - K in normal range and within 180 days    Potassium  Date Value Ref Range Status  12/12/2023 3.8 3.5 - 5.3 mmol/L Final  12/01/2013 3.7 3.5 - 5.1 mmol/L Final         Passed - Na in normal range and within 180 days    Sodium  Date Value Ref Range Status  12/12/2023 139 135 - 146 mmol/L Final  03/14/2021 140 134 - 144 mmol/L Final  12/01/2013 137 136 - 145 mmol/L Final         Passed - Cr in normal range and within 180 days    Creat  Date Value Ref Range Status  12/12/2023 0.78 0.50 - 1.03 mg/dL Final   Creatinine, Urine  Date Value Ref Range Status  12/12/2023 145 20 - 275 mg/dL Final         Passed - eGFR is 10 or above and within 180 days    GFR, Est African American  Date Value Ref Range Status  02/03/2020 121 > OR = 60 mL/min/1.73m2 Final   GFR, Est Non African American  Date Value Ref Range Status  02/03/2020 105 > OR = 60 mL/min/1.27m2 Final   GFR, Estimated  Date Value Ref Range Status  11/09/2023 >60 >60 mL/min Final    Comment:    (NOTE) Calculated using the CKD-EPI Creatinine Equation (2021)    eGFR  Date Value Ref Range Status  12/12/2023 92 > OR = 60 mL/min/1.65m2 Final  03/14/2021 100 >59 mL/min/1.73 Final         Passed - Patient is not pregnant      Passed - Last BP in normal range    BP Readings from Last 1 Encounters:  01/23/24 132/69         Passed - Valid encounter within last 6 months    Recent Outpatient Visits           2 months ago Morbid obesity [facility])   [place] [facility] Glenard Mire, MD   6 months ago Dyslipidemia associated with type 2 diabetes mellitus [facility])   [place] [facility] Glenard Mire, MD   9 months ago Well adult exam   [facility] Sowles, Krichna, MD       Future Appointments             In 2 weeks Franchester, Cadence H, PA-C [place] [facility] [place]   In 2 months Sowles, Krichna, MD [facility], [place]

## 2024-02-03 ENCOUNTER — Ambulatory Visit: Payer: Self-pay | Admitting: Internal Medicine

## 2024-02-13 ENCOUNTER — Other Ambulatory Visit (HOSPITAL_COMMUNITY): Payer: Self-pay

## 2024-02-17 ENCOUNTER — Ambulatory Visit: Admitting: Medical

## 2024-02-17 VITALS — BP 121/79 | HR 71 | Ht 62.0 in | Wt 282.0 lb

## 2024-02-17 DIAGNOSIS — R002 Palpitations: Secondary | ICD-10-CM

## 2024-02-17 DIAGNOSIS — R079 Chest pain, unspecified: Secondary | ICD-10-CM | POA: Diagnosis not present

## 2024-02-17 DIAGNOSIS — R0609 Other forms of dyspnea: Secondary | ICD-10-CM | POA: Diagnosis not present

## 2024-02-17 DIAGNOSIS — I471 Supraventricular tachycardia, unspecified: Secondary | ICD-10-CM | POA: Diagnosis not present

## 2024-02-17 DIAGNOSIS — E782 Mixed hyperlipidemia: Secondary | ICD-10-CM

## 2024-02-17 DIAGNOSIS — I2510 Atherosclerotic heart disease of native coronary artery without angina pectoris: Secondary | ICD-10-CM

## 2024-02-17 DIAGNOSIS — I10 Essential (primary) hypertension: Secondary | ICD-10-CM

## 2024-02-17 MED ORDER — REPATHA SURECLICK 140 MG/ML ~~LOC~~ SOAJ: 140.0000 mg | SUBCUTANEOUS | 3 refills | Status: AC

## 2024-02-17 MED ORDER — METOPROLOL SUCCINATE ER 25 MG PO TB24: 12.5000 mg | ORAL_TABLET | Freq: Every day | ORAL | 3 refills | Status: AC

## 2024-02-17 NOTE — Patient Instructions (Signed)
 Medication Instructions:  Your physician recommends the following medication changes.  START TAKING: Metoprolol  Succinate 12.5 mg by mouth daily  Repatha  140 mg inject into the skin every 12 days  *If you need a refill on your cardiac medications before your next appointment, please call your pharmacy*  Lab Work: No labs ordered today    Testing/Procedures: No test ordered today   Follow-Up: At [facility], you and your health needs are our priority.  As part of our continuing mission to provide you with exceptional heart care, our providers are all part of one team.  This team includes your primary Cardiologist (physician) and Advanced Practice Providers or APPs (Physician Assistants and Nurse Practitioners) who all work together to provide you with the care you need, when you need it.  Your next appointment:   2 month(s)  Provider:   Lonni Hanson, MD or Cadence Franchester, PA-C

## 2024-02-17 NOTE — Progress Notes (Signed)
 Cardiology Office Note   Date:  02/17/2024  ID:  Kelsey Alvarez, DOB 24-Jan-1971, MRN [id number] PCP: Sowles, Krichna, MD  [facility] Providers Cardiologist:  Lonni Hanson, MD   History of Present Illness Kelsey Alvarez is a 53 y.o. female with a history of hypertension, hyperlipidemia, diabetes type 2, morbid obesity, CAC who is being seen for palpitations, chest pain and exertional dyspnea.  Patient was seen in 2022 for palpitations.  Heart monitor at that time showed predominantly normal sinus rhythm with rare PACs and PVCs.  No significant arrhythmia was identified.  No further workup was pursued.  Patient was seen 01/08/2024 with chest pain and palpitations.  EKG showed poor R wave progression and possible inferior Q waves.  Stress test and heart monitor were ordered.  Myoview Lexiscan  was normal and low risk with no evidence of ischemia.  CT images showed minimal coronary calcium .  Heart monitor showed normal sinus rhythm, average heart rate of 69 bpm, 1 run of SVT lasting 4 beats.  SVT detected within 45 seconds of symptomatic patient events.  Today, the patient reports she is still having palpitations, SOB and chest pressure. Symptoms seem to be worse on exertion, but occasionally occur with rest. She has asthma and used to smoke, she quit 20 years ago. PCP wanted sleep apnea test, but insurance wouldn't pay for it, however says they may pay now since she met her deductible. I recommended she revisit this with PCP. She tried Ozempic , but was very nauseous. She said she has tried pravastatin , atorvastatin , rosuvastatin , and they all caused headaches.   Studies Reviewed      Heart monitor 02/2024 Patch Wear Time:  11 days and 15 hours (2025-11-24T18:34:35-0500 to 2025-12-06T09:59:30-0500)   Patient had a min HR of 40 bpm, max HR of 167 bpm, and avg HR of 69 bpm. Predominant underlying rhythm was Sinus Rhythm. 1 run of Supraventricular Tachycardia occurred lasting 4 beats  with a max rate of 167 bpm (avg 155 bpm). Supraventricular Tachycardia  was detected within +/- 45 seconds of symptomatic patient event(s). Isolated SVEs were rare (<1.0%), and no SVE Couplets or SVE Triplets were present. Isolated VEs were rare (<1.0%), and no VE Couplets or VE Triplets were present.   Cardiac PET stress 01/2024    The study is normal. The study is overall low risk.   LV perfusion is grossly normal. There is no evidence of ischemia. There is no evidence of infarction. There is a single inferoapical segment of mildly decreased perfusion with stress that is reversible, however wall motion in this segment is normal, suggesting likely artifact vs small distal vessel ischemia.   Rest left ventricular function is normal. Rest EF: 76%. Stress left ventricular function is normal. Stress EF: 79%. End diastolic cavity size is normal. End systolic cavity size is normal. No evidence of transient ischemic dilation (TID) noted.   Myocardial blood flow was computed to be 1.15ml/g/min at rest and 2.80ml/g/min at stress. Global myocardial blood flow reserve was 2.01 and was normal.   Coronary calcium  was present on the attenuation correction CT images. Minimal coronary calcifications were present. Coronary calcifications were present in the left anterior descending artery, left circumflex artery and right coronary artery distribution(s).   Electronically signed by: Soyla DELENA Merck, MD      Physical Exam VS:  BP 121/79 (BP Location: Right Arm, Patient Position: Sitting, Cuff Size: Large)   Pulse 71   Ht 5' 2 (1.575 m)   Wt 282 lb (127.9 kg)  LMP 03/16/2022   SpO2 97%   BMI 51.58 kg/m        Wt Readings from Last 3 Encounters:  02/17/24 282 lb (127.9 kg)  01/08/24 279 lb 6.4 oz (126.7 kg)  11/29/23 280 lb (127 kg)    GEN: Well nourished, well developed in no acute distress NECK: No JVD; No carotid bruits CARDIAC: RRR, no murmurs, rubs, gallops RESPIRATORY:  Clear to auscultation  without rales, wheezing or rhonchi  ABDOMEN: Soft, non-tender, non-distended EXTREMITIES:  No edema; No deformity   ASSESSMENT AND PLAN  Palpitations, SOB, chest pain pSVT The patient reports persistent palpitations with associated SOB and chest pressure, worse with exertion. Heart monitor showed NSR with 1 brief run of pSVT associated with symptomatic events. I will start Toprol  12.5mg  daily.  We will reevaluate symptoms at follow-up. I do suspect symptoms are multifactorial due to obesity, general deconditioning, possible OSA. Recommended healthy lifestyle with diet and exercise.   CAC Stress test was negative and low risk, it did show minimal CAC. She reports chest pressure related to palpitations as above. Lifestyle changes recommended. Continue ASA and Nexlizet . We will re-evaluate symptoms at follow-up.   HLD LDL 113. She reports she has not tolerate 3 different statins. I will order Repatha . Continue Nexlizet .   HTN BP is good today. Continue Diovan -hydrochlorothiazide . Start Toprol  was above.        Dispo: Follow-up in 2 months  Signed, Dilia Alemany VEAR Fishman, PA-C

## 2024-02-18 ENCOUNTER — Telehealth: Admitting: Physician Assistant

## 2024-02-18 DIAGNOSIS — J019 Acute sinusitis, unspecified: Secondary | ICD-10-CM

## 2024-02-18 MED ORDER — DOXYCYCLINE HYCLATE 100 MG PO TABS: 100.0000 mg | ORAL_TABLET | Freq: Two times a day (BID) | ORAL | 0 refills | Status: DC

## 2024-02-18 NOTE — Patient Instructions (Addendum)
 Sharlet Essex, thank you for joining Lovette Borg, PA-C for today's virtual visit.  While this provider is not your primary care provider (PCP), if your PCP is located in our provider database this encounter information will be shared with them immediately following your visit.   A [facility] MyChart account gives you access to today's visit and all your visits, tests, and labs performed at [facility]  click here if you don't have a [facility] MyChart account or go to [email or web address]  Consent: (Patient) Kelsey Alvarez provided verbal consent for this virtual visit at the beginning of the encounter.  Current Medications:  Current Outpatient Medications:    Albuterol -Budesonide  (AIRSUPRA ) 90-80 MCG/ACT AERO, INHALE 2 PUFFS INTO THE LUNGS 4 TIMES DAILY AS NEEDED., Disp: 32.1 g, Rfl: 0   Alpha Lipoic Acid 200 MG CAPS, Take by mouth., Disp: , Rfl:    aspirin  EC 81 MG tablet, Take 1 tablet (81 mg total) by mouth daily. Swallow whole., Disp: , Rfl:    baclofen  (LIORESAL ) 10 MG tablet, Take 1 tablet (10 mg total) by mouth at bedtime as needed for muscle spasms., Disp: 90 each, Rfl: 0   Bempedoic Acid-Ezetimibe  (NEXLIZET ) 180-10 MG TABS, Take 1 tablet by mouth daily at 12 noon. In place of pravastatin  and zetia , Disp: 90 tablet, Rfl: 1   celecoxib  (CELEBREX ) 100 MG capsule, Take 1 capsule (100 mg total) by mouth 2 (two) times daily., Disp: 180 capsule, Rfl: 0   Cetirizine HCl (ZYRTEC PO), Take 10 mg by mouth daily., Disp: , Rfl:    Cholecalciferol (VITAMIN D ) 2000 UNITS tablet, Take 1 tablet by mouth daily., Disp: , Rfl:    doxycycline  (VIBRA -TABS) 100 MG tablet, Take 1 tablet (100 mg total) by mouth 2 (two) times daily., Disp: 14 tablet, Rfl: 0   FARXIGA  10 MG TABS tablet, Take 1 tablet (10 mg total) by mouth daily before breakfast., Disp: 90 tablet, Rfl: 1   fluticasone  (FLONASE ) 50 MCG/ACT nasal spray, Place 2 sprays into both nostrils daily., Disp: 16 g, Rfl: 5    ipratropium-albuterol  (DUONEB) 0.5-2.5 (3) MG/3ML SOLN, Take 3 mLs by nebulization every 4 (four) hours as needed., Disp: 120 mL, Rfl: 0   meclizine (ANTIVERT) 12.5 MG tablet, Take 12.5-25 mg by mouth 3 (three) times daily as needed., Disp: , Rfl:    montelukast  (SINGULAIR ) 10 MG tablet, TAKE 1 TABLET (10 MG TOTAL) BY MOUTH AT BEDTIME. TAKE 1 TABLET BY MOUTH EVERYDAY AT BEDTIME, Disp: 90 tablet, Rfl: 1   pioglitazone  (ACTOS ) 15 MG tablet, TAKE 1 TABLET (15 MG TOTAL) BY MOUTH DAILY., Disp: 90 tablet, Rfl: 1   valsartan -hydrochlorothiazide  (DIOVAN -HCT) 160-12.5 MG tablet, TAKE 1 TABLET BY MOUTH EVERY DAY, Disp: 90 tablet, Rfl: 0   budesonide -formoterol  (SYMBICORT ) 160-4.5 MCG/ACT inhaler, Inhale 2 puffs into the lungs 2 (two) times daily. (Patient not taking: Reported on 02/17/2024), Disp: 3 each, Rfl: 1   Evolocumab  (REPATHA  SURECLICK) 140 MG/ML SOAJ, Inject 140 mg into the skin every 14 (fourteen) days., Disp: 6 mL, Rfl: 3   fluconazole  (DIFLUCAN ) 150 MG tablet, Take 150 mg by mouth every other day., Disp: , Rfl:    metoprolol  succinate (TOPROL -XL) 25 MG 24 hr tablet, Take 0.5 tablets (12.5 mg total) by mouth daily. Take with or immediately following a meal., Disp: 45 tablet, Rfl: 3   Semaglutide , 1 MG/DOSE, 4 MG/3ML SOPN, Inject 1 mg as directed once a week. (Patient not taking: Reported on 02/17/2024), Disp: 9 mL, Rfl: 0   Medications ordered  in this encounter:  Meds ordered this encounter  Medications   doxycycline  (VIBRA -TABS) 100 MG tablet    Sig: Take 1 tablet (100 mg total) by mouth 2 (two) times daily.    Dispense:  14 tablet    Refill:  0    Supervising Provider:   BLAISE ALEENE KIDD [[id number]]     *If you need refills on other medications prior to your next appointment, please contact your pharmacy*  Follow-Up: Call back or seek an in-person evaluation if the symptoms worsen or if the condition fails to improve as anticipated.  [facility] [phone]  Other  Instructions Increase fluids Continue to use otc nasal spray. Start medicine as prescribed. Considered oral steroids, but since pt is diabetic will defer.  Considered oral Sudafed, but since pt with h/o HTN on medicine will defer.  Continue to watch for worsening symptoms. Schedule a virtual appointment or follow up at an urgent care clinic if symptoms don't improve.    If you have been instructed to have an in-person evaluation today at a local Urgent Care facility, please use the link below. It will take you to a list of all of our available [facility], including address, phone number and hours of operation. Please do not delay care.  [facility]  If you or a family member do not have a primary care provider, use the link below to schedule a visit and establish care. When you choose a [facility] primary care physician or advanced practice provider, you gain a long-term partner in health. Find a Primary Care Provider  Learn more about [facility]'s in-office and virtual care options: [facility] - Get Care Now

## 2024-02-18 NOTE — Progress Notes (Signed)
 Virtual Visit Consent   Kelsey Alvarez, you are scheduled for a virtual visit with a [facility] provider today. Just as with appointments in the office, your consent must be obtained to participate. Your consent will be active for this visit and any virtual visit you may have with one of our providers in the next 365 days. If you have a MyChart account, a copy of this consent can be sent to you electronically.  As this is a virtual visit, video technology does not allow for your provider to perform a traditional examination. This may limit your provider's ability to fully assess your condition. If your provider identifies any concerns that need to be evaluated in person or the need to arrange testing (such as labs, EKG, etc.), we will make arrangements to do so. Although advances in technology are sophisticated, we cannot ensure that it will always work on either your end or our end. If the connection with a video visit is poor, the visit may have to be switched to a telephone visit. With either a video or telephone visit, we are not always able to ensure that we have a secure connection.  By engaging in this virtual visit, you consent to the provision of healthcare and authorize for your insurance to be billed (if applicable) for the services provided during this visit. Depending on your insurance coverage, you may receive a charge related to this service.  I need to obtain your verbal consent now. Are you willing to proceed with your visit today? Kelsey Alvarez has provided verbal consent on 02/18/2024 for a virtual visit (video or telephone). Kelsey Borg, PA-C  Date: 02/18/2024 4:57 PM   Virtual Visit via Video Note   I, Kelsey Alvarez, connected with  Kelsey Alvarez  ([id number], July 02, 1970) on 02/18/2024 at  5:15 PM EST by a video-enabled telemedicine application and verified that I am speaking with the correct person using two identifiers.  Location: Patient: Virtual Visit Location  Patient: Home Provider: Virtual Visit Location Provider: Home Office   I discussed the limitations of evaluation and management by telemedicine and the availability of in person appointments. The patient expressed understanding and agreed to proceed.    History of Present Illness: Kelsey Alvarez is a 53 y.o. who identifies as a female who was assigned female at birth, and is being seen today for URI symptoms x 5-7 days without much improvement with otc meds.  HPI: 53y/o F presents for a telehealth video visit for c/o head and nasal congestion, post nasal drainage, cough. Tested negative for Covid. Denies CP, SOB. Has tried otc Dayquil, Tylenol , nasal sprays, but unable to improve symptoms and now worsening. No known exposure to strep or flu.    URI     Problems:  Patient Active Problem List   Diagnosis Date Noted   Palpitations 01/10/2024   Chest pain 01/10/2024   Diabetic neuropathy (HCC) 02/22/2023   Asthma, well controlled 02/22/2023   Localized osteoarthritis of left knee 03/09/2022   Lumbar back pain with radiculopathy affecting left lower extremity 03/09/2022   Hyperlipidemia associated with type 2 diabetes mellitus (HCC) 01/20/2018   Endometriosis 11/06/2017   Menorrhagia with irregular cycle 10/28/2017   Fibroid uterus 10/28/2017   Carpal tunnel syndrome on both sides 10/17/2017   B12 deficiency 04/27/2016   Oligomenorrhea 10/12/2015   Essential hypertension 12/28/2014   Controlled type 2 diabetes mellitus with microalbuminuria (HCC) 12/28/2014   Edema 12/28/2014   Female infertility 12/28/2014   Gastro-esophageal reflux disease without esophagitis  12/28/2014   Morbid obesity (HCC) 12/28/2014   Perennial allergic rhinitis 12/28/2014   Intermittent tremor 12/28/2014   Vitamin D  deficiency 12/28/2014   Intermittent low back pain 12/28/2014   Left sciatic nerve pain 12/28/2014    Allergies: Allergies[1] Medications: Current  Medications[2]  Observations/Objective: Patient is well-developed, well-nourished in no acute distress.  Resting comfortably  at home.  Head is normocephalic, atraumatic.  No labored breathing.  Speech is clear and coherent with logical content.  Patient is alert and oriented at baseline.    Assessment and Plan: 1. Acute non-recurrent sinusitis, unspecified location (Primary) - doxycycline  (VIBRA -TABS) 100 MG tablet; Take 1 tablet (100 mg total) by mouth 2 (two) times daily.  Dispense: 14 tablet; Refill: 0  Increase fluids Continue to use otc nasal spray. Start medicine as prescribed. Considered oral steroids, but since pt is diabetic will defer.  Considered oral Sudafed, but since pt with h/o HTN on medicine will defer.  Continue to watch for worsening symptoms. Schedule a virtual appointment or follow up at an urgent care clinic if symptoms don't improve.  Pt verbalized understanding and in agreement.    Follow Up Instructions: I discussed the assessment and treatment plan with the patient. The patient was provided an opportunity to ask questions and all were answered. The patient agreed with the plan and demonstrated an understanding of the instructions.  A copy of instructions were sent to the patient via MyChart unless otherwise noted below.   Patient has requested to receive PHI (AVS, Work Notes, etc) pertaining to this video visit through e-mail as they are currently without active MyChart. They have voiced understand that email is not considered secure and their health information could be viewed by someone other than the patient.   The patient was advised to call back or seek an in-person evaluation if the symptoms worsen or if the condition fails to improve as anticipated.    Kelsey Steinberg, PA-C    [1]  Allergies Allergen Reactions   Fish Allergy Shortness Of Breath   Penicillins Anaphylaxis and Rash    Childhood allergy   Crestor  [Rosuvastatin ]     Headache     Metformin  And Related Diarrhea   Adhesive [Tape] Rash    Paper tape ok to use.  [2]  Current Outpatient Medications:    Albuterol -Budesonide  (AIRSUPRA ) 90-80 MCG/ACT AERO, INHALE 2 PUFFS INTO THE LUNGS 4 TIMES DAILY AS NEEDED., Disp: 32.1 g, Rfl: 0   Alpha Lipoic Acid 200 MG CAPS, Take by mouth., Disp: , Rfl:    aspirin  EC 81 MG tablet, Take 1 tablet (81 mg total) by mouth daily. Swallow whole., Disp: , Rfl:    baclofen  (LIORESAL ) 10 MG tablet, Take 1 tablet (10 mg total) by mouth at bedtime as needed for muscle spasms., Disp: 90 each, Rfl: 0   Bempedoic Acid-Ezetimibe  (NEXLIZET ) 180-10 MG TABS, Take 1 tablet by mouth daily at 12 noon. In place of pravastatin  and zetia , Disp: 90 tablet, Rfl: 1   celecoxib  (CELEBREX ) 100 MG capsule, Take 1 capsule (100 mg total) by mouth 2 (two) times daily., Disp: 180 capsule, Rfl: 0   Cetirizine HCl (ZYRTEC PO), Take 10 mg by mouth daily., Disp: , Rfl:    Cholecalciferol (VITAMIN D ) 2000 UNITS tablet, Take 1 tablet by mouth daily., Disp: , Rfl:    doxycycline  (VIBRA -TABS) 100 MG tablet, Take 1 tablet (100 mg total) by mouth 2 (two) times daily., Disp: 14 tablet, Rfl: 0   FARXIGA  10 MG TABS tablet,  Take 1 tablet (10 mg total) by mouth daily before breakfast., Disp: 90 tablet, Rfl: 1   fluticasone  (FLONASE ) 50 MCG/ACT nasal spray, Place 2 sprays into both nostrils daily., Disp: 16 g, Rfl: 5   ipratropium-albuterol  (DUONEB) 0.5-2.5 (3) MG/3ML SOLN, Take 3 mLs by nebulization every 4 (four) hours as needed., Disp: 120 mL, Rfl: 0   meclizine (ANTIVERT) 12.5 MG tablet, Take 12.5-25 mg by mouth 3 (three) times daily as needed., Disp: , Rfl:    montelukast  (SINGULAIR ) 10 MG tablet, TAKE 1 TABLET (10 MG TOTAL) BY MOUTH AT BEDTIME. TAKE 1 TABLET BY MOUTH EVERYDAY AT BEDTIME, Disp: 90 tablet, Rfl: 1   pioglitazone  (ACTOS ) 15 MG tablet, TAKE 1 TABLET (15 MG TOTAL) BY MOUTH DAILY., Disp: 90 tablet, Rfl: 1   valsartan -hydrochlorothiazide  (DIOVAN -HCT) 160-12.5 MG tablet, TAKE  1 TABLET BY MOUTH EVERY DAY, Disp: 90 tablet, Rfl: 0   budesonide -formoterol  (SYMBICORT ) 160-4.5 MCG/ACT inhaler, Inhale 2 puffs into the lungs 2 (two) times daily. (Patient not taking: Reported on 02/17/2024), Disp: 3 each, Rfl: 1   Evolocumab  (REPATHA  SURECLICK) 140 MG/ML SOAJ, Inject 140 mg into the skin every 14 (fourteen) days., Disp: 6 mL, Rfl: 3   fluconazole  (DIFLUCAN ) 150 MG tablet, Take 150 mg by mouth every other day., Disp: , Rfl:    metoprolol  succinate (TOPROL -XL) 25 MG 24 hr tablet, Take 0.5 tablets (12.5 mg total) by mouth daily. Take with or immediately following a meal., Disp: 45 tablet, Rfl: 3   Semaglutide , 1 MG/DOSE, 4 MG/3ML SOPN, Inject 1 mg as directed once a week. (Patient not taking: Reported on 02/17/2024), Disp: 9 mL, Rfl: 0

## 2024-02-21 DIAGNOSIS — R002 Palpitations: Secondary | ICD-10-CM | POA: Diagnosis not present

## 2024-03-09 ENCOUNTER — Ambulatory Visit: Admitting: Family Medicine

## 2024-03-09 ENCOUNTER — Encounter: Payer: Self-pay | Admitting: Family Medicine

## 2024-03-09 VITALS — BP 128/76 | HR 96 | Resp 16 | Ht 62.0 in | Wt 281.8 lb

## 2024-03-09 DIAGNOSIS — J4541 Moderate persistent asthma with (acute) exacerbation: Secondary | ICD-10-CM

## 2024-03-09 DIAGNOSIS — G720 Drug-induced myopathy: Secondary | ICD-10-CM

## 2024-03-09 DIAGNOSIS — E1169 Type 2 diabetes mellitus with other specified complication: Secondary | ICD-10-CM

## 2024-03-09 DIAGNOSIS — I10 Essential (primary) hypertension: Secondary | ICD-10-CM

## 2024-03-09 DIAGNOSIS — E785 Hyperlipidemia, unspecified: Secondary | ICD-10-CM

## 2024-03-09 DIAGNOSIS — E538 Deficiency of other specified B group vitamins: Secondary | ICD-10-CM

## 2024-03-09 DIAGNOSIS — T466X5D Adverse effect of antihyperlipidemic and antiarteriosclerotic drugs, subsequent encounter: Secondary | ICD-10-CM

## 2024-03-09 DIAGNOSIS — B3731 Acute candidiasis of vulva and vagina: Secondary | ICD-10-CM

## 2024-03-09 DIAGNOSIS — Z7984 Long term (current) use of oral hypoglycemic drugs: Secondary | ICD-10-CM

## 2024-03-09 LAB — POCT GLYCOSYLATED HEMOGLOBIN (HGB A1C): Hemoglobin A1C: 6.5 % — AB (ref 4.0–5.6)

## 2024-03-09 MED ORDER — VALSARTAN-HYDROCHLOROTHIAZIDE 160-12.5 MG PO TABS: 1.0000 | ORAL_TABLET | Freq: Every day | ORAL | 1 refills | Status: AC

## 2024-03-09 MED ORDER — NEXLIZET 180-10 MG PO TABS: 1.0000 | ORAL_TABLET | Freq: Every day | ORAL | 1 refills | Status: AC

## 2024-03-09 MED ORDER — FLUCONAZOLE 150 MG PO TABS: 150.0000 mg | ORAL_TABLET | ORAL | 1 refills | Status: AC

## 2024-03-09 MED ORDER — BUDESONIDE-FORMOTEROL FUMARATE 160-4.5 MCG/ACT IN AERO: 2.0000 | INHALATION_SPRAY | Freq: Two times a day (BID) | RESPIRATORY_TRACT | 1 refills | Status: AC

## 2024-03-09 MED ORDER — BENZONATATE 100 MG PO CAPS: 100.0000 mg | ORAL_CAPSULE | Freq: Three times a day (TID) | ORAL | 0 refills | Status: AC | PRN

## 2024-03-09 MED ORDER — PREDNISONE 10 MG PO TABS: 10.0000 mg | ORAL_TABLET | Freq: Two times a day (BID) | ORAL | 0 refills | Status: AC

## 2024-03-09 MED ORDER — TIRZEPATIDE 2.5 MG/0.5ML ~~LOC~~ SOAJ: 2.5000 mg | SUBCUTANEOUS | 0 refills | Status: AC

## 2024-03-09 NOTE — Progress Notes (Signed)
 Name: Kelsey Alvarez   MRN: [id number]    DOB: 31-Oct-1970   Date:03/09/2024       Progress Note  Subjective  Chief Complaint  Chief Complaint  Patient presents with   Medical Management of Chronic Issues    Unable to tolerate ozempic  due to nausea   Sinusitis    Cough, congestion since Wed   Discussed the use of AI scribe software for clinical note transcription with the patient, who gave verbal consent to proceed.  History of Present Illness Kelsey Alvarez is a 54 year old female with asthma who presents with sinus pressure and cough.  She has been experiencing sinus pressure, facial congestion, and a cough since Tuesday night, with symptoms worsening on Wednesday. The congestion appears to be moving into her chest, and she also reports ear irritation and coughing with associated pain and pressure. This is her third episode of similar symptoms, with previous occurrences before Christmas. During the last episode, treatment with desipramine resolved her symptoms. She recently completed a fourteen-day course of doxycycline  for sinus issues.  She has a history of asthma and is currently using Symbicort  twice a day for maintenance and Airsupra  as a rescue inhaler. She experiences some wheezing at night and has been using over-the-counter Flonase  for symptom relief.  Regarding her diabetes, her A1c is 6.5%. She was taking Ozempic  but discontinued it due to nausea. She is currently on pioglitazone  50 mg and Farxiga . She experiences recurrent yeast infections, which she attributes to Farxiga , and has been taking Diflucan  for treatment.  She has hypertension, controlled with valsartan  HCTZ 160/12.5 mg and metoprolol . She also takes Repatha  and Nexlizet  for cholesterol management. She has a history of shortness of breath, chest pain, and palpitations, for which she underwent an EKG, Myoview scan , and CT cardiac coronary calcium  score. A Holter monitor recorded one episode of SVT.  No excessive  hunger or thirst. Her blood pressure is well controlled.    Patient Active Problem List   Diagnosis Date Noted   Palpitations 01/10/2024   Chest pain 01/10/2024   Diabetic neuropathy (HCC) 02/22/2023   Asthma, well controlled 02/22/2023   Localized osteoarthritis of left knee 03/09/2022   Lumbar back pain with radiculopathy affecting left lower extremity 03/09/2022   Hyperlipidemia associated with type 2 diabetes mellitus (HCC) 01/20/2018   Endometriosis 11/06/2017   Menorrhagia with irregular cycle 10/28/2017   Fibroid uterus 10/28/2017   Carpal tunnel syndrome on both sides 10/17/2017   B12 deficiency 04/27/2016   Oligomenorrhea 10/12/2015   Essential hypertension 12/28/2014   Controlled type 2 diabetes mellitus with microalbuminuria (HCC) 12/28/2014   Edema 12/28/2014   Female infertility 12/28/2014   Gastro-esophageal reflux disease without esophagitis 12/28/2014   Morbid obesity (HCC) 12/28/2014   Perennial allergic rhinitis 12/28/2014   Intermittent tremor 12/28/2014   Vitamin D  deficiency 12/28/2014   Intermittent low back pain 12/28/2014   Left sciatic nerve pain 12/28/2014    Past Surgical History:  Procedure Laterality Date   APPENDECTOMY     cervical polyp removal     DILATION AND CURETTAGE OF UTERUS     HYSTEROSCOPY     HYSTEROSCOPY WITH D & C N/A 10/12/2015   Procedure: DILATATION AND CURETTAGE /HYSTEROSCOPY;  Surgeon: Garnette JONETTA Mace, MD;  Location: [facility];  Service: Gynecology;  Laterality: N/A;   LAPAROSCOPIC BILATERAL SALPINGO OOPHERECTOMY N/A 10/12/2015   Procedure: LAPAROSCOPIC RIGHT SALPINGECTOMY, LEFT OOPHORECTOMY;  Surgeon: Garnette JONETTA Mace, MD;  Location: [facility];  Service: Gynecology;  Laterality:  N/A;   LAPAROSCOPIC OVARIAN CYSTECTOMY Bilateral 10/12/2015   Procedure: LAPAROSCOPIC OVARIAN CYSTECTOMY;  Surgeon: Garnette JONETTA Mace, MD;  Location: [facility];  Service: Gynecology;  Laterality: Bilateral;   OOPHORECTOMY     OVARIAN CYST REMOVAL       Family History  Problem Relation Age of Onset   Heart failure Mother    Breast cancer Mother 77   Hypertension Mother    Diabetes Mother    CAD Mother    Cancer Mother 53       breast cancer   Heart attack Mother 21       Triple Bypass   Asthma Mother    Heart disease Mother    Peripheral Artery Disease Mother    CAD Father    Hypertension Father    Arthritis Father    Thyroid disease Sister    Asthma Sister    Alzheimer's disease Maternal Grandmother    Alzheimer's disease Maternal Grandfather    Breast cancer Paternal Grandmother     Social History   Tobacco Use   Smoking status: Former    Current packs/day: 0.00    Average packs/day: 0.3 packs/day for 1 year (0.3 ttl pk-yrs)    Types: Cigarettes    Start date: 12/03/2001    Quit date: 12/28/2002    Years since quitting: 21.2   Smokeless tobacco: Never   Tobacco comments:    smoked 3 to 4 cigarrettes a day for a year  Substance Use Topics   Alcohol use: Yes    Comment: Maybe 2 a year    Current Medications[1]  Allergies[2]  I personally reviewed active problem list, medication list, allergies, family history with the patient/caregiver today.   ROS  Ten systems reviewed and is negative except as mentioned in HPI    Objective Physical Exam  CONSTITUTIONAL: Patient appears well-developed and well-nourished. No distress. HEENT: Head atraumatic, normocephalic, neck supple. CARDIOVASCULAR: Normal rate, regular rhythm and normal heart sounds. No murmur heard. No BLE edema. PULMONARY: Effort normal and breath sounds normal. Lungs clear to auscultation, no wheeze. No respiratory distress. ABDOMINAL: There is no tenderness or distention. MUSCULOSKELETAL: Normal gait. Without gross motor or sensory deficit. PSYCHIATRIC: Patient has a normal mood and affect. Behavior is normal. Judgment and thought content normal.  Vitals:   03/09/24 1325  BP: 128/76  Pulse: 96  Resp: 16  SpO2: 100%  Weight: 281 lb  12.8 oz (127.8 kg)  Height: 5' 2 (1.575 m)    Body mass index is 51.54 kg/m.  Recent Results (from the past 2160 hours)  Urine Microalbumin w/creat. ratio     Status: None   Collection Time: 12/12/23 10:47 AM  Result Value Ref Range   Creatinine, Urine 145 20 - 275 mg/dL   Microalb, Ur 0.9 mg/dL    Comment: Reference Range Not established    Microalb Creat Ratio 6 <30 mg/g creat    Comment: . The ADA defines abnormalities in albumin excretion as follows: SABRA Albuminuria Category        Result (mg/g creatinine) . Normal to Mildly increased   <30 Moderately increased         30-299  Severely increased           > OR = 300 . The ADA recommends that at least two of three specimens collected within a 3-6 month period be abnormal before considering a patient to be within a diagnostic category.   Lipid panel     Status: Abnormal  Collection Time: 12/12/23 10:47 AM  Result Value Ref Range   Cholesterol 189 <200 mg/dL   HDL 44 (L) > OR = 50 mg/dL   Triglycerides 792 (H) <150 mg/dL    Comment: . If a non-fasting specimen was collected, consider repeat triglyceride testing on a fasting specimen if clinically indicated.  Veatrice et al. J. of Clin. Lipidol. 2015;9:129-169. SABRA    LDL Cholesterol (Calc) 113 (H) mg/dL (calc)    Comment: Reference range: <100 . Desirable range <100 mg/dL for primary prevention;   <70 mg/dL for patients with CHD or diabetic patients  with > or = 2 CHD risk factors. SABRA LDL-C is now calculated using the Martin-Hopkins  calculation, which is a validated novel method providing  better accuracy than the Friedewald equation in the  estimation of LDL-C.  Gladis APPLETHWAITE et al. SANDREA. 7986;689(80): 2061-2068  (http://education.[facility].com/faq/FAQ164)    Total CHOL/HDL Ratio 4.3 <5.0 (calc)   Non-HDL Cholesterol (Calc) 145 (H) <130 mg/dL (calc)    Comment: For patients with diabetes plus 1 major ASCVD risk  factor, treating to a non-HDL-C goal of  <100 mg/dL  (LDL-C of <29 mg/dL) is considered a therapeutic  option.   Comprehensive metabolic panel with GFR     Status: Abnormal   Collection Time: 12/12/23 10:47 AM  Result Value Ref Range   Glucose, Bld 125 (H) 65 - 99 mg/dL    Comment: .            Fasting reference interval . For someone without known diabetes, a glucose value between 100 and 125 mg/dL is consistent with prediabetes and should be confirmed with a follow-up test. .    BUN 11 7 - 25 mg/dL   Creat 9.21 9.49 - 8.96 mg/dL   eGFR 92 > OR = 60 fO/fpw/8.26f7   BUN/Creatinine Ratio SEE NOTE: 6 - 22 (calc)    Comment:    Not Reported: BUN and Creatinine are within    reference range. .    Sodium 139 135 - 146 mmol/L   Potassium 3.8 3.5 - 5.3 mmol/L   Chloride 99 98 - 110 mmol/L   CO2 30 20 - 32 mmol/L   Calcium  9.5 8.6 - 10.4 mg/dL   Total Protein 7.3 6.1 - 8.1 g/dL   Albumin 4.2 3.6 - 5.1 g/dL   Globulin 3.1 1.9 - 3.7 g/dL (calc)   AG Ratio 1.4 1.0 - 2.5 (calc)   Total Bilirubin 0.6 0.2 - 1.2 mg/dL   Alkaline phosphatase (APISO) 53 37 - 153 U/L   AST 29 10 - 35 U/L   ALT 40 (H) 6 - 29 U/L  VITAMIN D  25 Hydroxy (Vit-D Deficiency, Fractures)     Status: None   Collection Time: 12/12/23 10:47 AM  Result Value Ref Range   Vit D, 25-Hydroxy 35 30 - 100 ng/mL    Comment: Vitamin D  Status         25-OH Vitamin D : . Deficiency:                    <20 ng/mL Insufficiency:             20 - 29 ng/mL Optimal:                 > or = 30 ng/mL . For 25-OH Vitamin D  testing on patients on  D2-supplementation and patients for whom quantitation  of D2 and D3 fractions is required, the [facility](TM) 25-OH VIT D, (D2,D3), LC/MS/MS  is recommended: order  code 07111 (patients >32yrs). . See Note 1 . Note 1 . For additional information, please refer to  http://education.[facility].com/faq/FAQ199  (This link is being provided for informational/ educational purposes only.)   B12 and Folate Panel      Status: None   Collection Time: 12/12/23 10:47 AM  Result Value Ref Range   Vitamin B-12 796 200 - 1,100 pg/mL   Folate >24.0 ng/mL    Comment:                            Reference Range                            Low:           <3.4                            Borderline:    3.4-5.4                            Normal:        >5.4 .   NM PET CT CARDIAC PERFUSION MULTI W/ABSOLUTE BLOODFLOW     Status: None   Collection Time: 01/23/24  3:13 PM  Result Value Ref Range   Rest Nuclear Isotope Dose 24.2 mCi   Stress Nuclear Isotope Dose 24.3 mCi   SSS 2.0    SRS 0.0    TID 1.12    Nuc Stress EF 79 %   Nuc Rest EF 76 %   ST Depression (mm) 0 mm   Rest HR 72.0 bpm   Rest BP 125/65 mmHg   Peak HR 95 bpm   Peak BP 148/75 mmHg   Rest MBF 1.15 ml/g/min   Stress MBF 2.31 ml/g/min   MBFR 2.01       PHQ2/9:    03/09/2024    1:20 PM 11/29/2023    3:26 PM 07/23/2023    3:28 PM 04/09/2023    1:03 PM 02/22/2023    1:31 PM  Depression screen PHQ 2/9  Decreased Interest 0 0 0 1 0  Down, Depressed, Hopeless 0 0 0 1 0  PHQ - 2 Score 0 0 0 2 0  Altered sleeping   0 1   Tired, decreased energy   0 1   Change in appetite   0 0   Feeling bad or failure about yourself    0 0   Trouble concentrating   0 0   Moving slowly or fidgety/restless   0 0   Suicidal thoughts   0 0   PHQ-9 Score   0  4    Difficult doing work/chores   Not difficult at all Somewhat difficult      Data saved with a previous flowsheet row definition    phq 9 is negative  Fall Risk:    03/09/2024    1:20 PM 11/29/2023    3:26 PM 07/23/2023    3:28 PM 04/09/2023    1:03 PM 02/22/2023    1:30 PM  Fall Risk   Falls in the past year? 0 0 0 0 0  Number falls in past yr: 0 0 0 0 0  Injury with Fall? 0 0  0  0  0   Risk for fall due to : No Fall Risks  No Fall Risks No Fall Risks No Fall Risks No Fall Risks  Follow up Falls evaluation completed Falls evaluation completed Falls evaluation completed Falls prevention  discussed;Education provided;Falls evaluation completed Falls prevention discussed;Education provided;Falls evaluation completed     Data saved with a previous flowsheet row definition     Assessment & Plan Moderate persistent asthma with acute exacerbation Acute exacerbation likely due to viral URI. Symptoms include sinus pressure, cough, chest congestion, and nocturnal wheezing. Symbicort  not used regularly. - Resume Symbicort  twice daily. - Use Airsupra  as needed up to four times daily. - Prescribed prednisone . - Continue Flonase . - Provided nasal saline.  Type 2 diabetes mellitus with dyslipidemia Diabetes well-controlled with A1c of 6.5%. Intolerance to Ozempic . Discussed Mounjaro  as alternative GLP-1 receptor agonist for diabetes management and weight loss. - Switched to Mounjaro , starting with a lower dose. - Continue pioglitazone  and Farxiga . - Monitor for yeast infections.  Morbid obesity BMI over 50. Weight loss beneficial for health and diabetes management. Discussed Mounjaro  benefits for weight loss. - Encouraged dietary modifications and potential use of Mounjaro .  Yeast vaginitis Recurrent yeast infections likely due to diabetes and medication. Current Diflucan  treatment not fully effective. - Prescribed Diflucan  every other day for three days. - Provided 52-month supply for monthly treatment.  Essential hypertension Hypertension well-controlled. BP 128/76 mmHg. - Continue valsartan  HCTZ and metoprolol .  Statin myopathy Intolerance to statins. Managed with Repatha  and Nexlizet . - Continue Repatha  and Nexlizet .  General health maintenance Routine health maintenance up to date. Mammogram scheduled. Eye exam needed. - Schedule eye exam and inform provider of diabetes.        [1]  Current Outpatient Medications:    Albuterol -Budesonide  (AIRSUPRA ) 90-80 MCG/ACT AERO, INHALE 2 PUFFS INTO THE LUNGS 4 TIMES DAILY AS NEEDED., Disp: 32.1 g, Rfl: 0   Alpha Lipoic  Acid 200 MG CAPS, Take by mouth., Disp: , Rfl:    aspirin  EC 81 MG tablet, Take 1 tablet (81 mg total) by mouth daily. Swallow whole., Disp: , Rfl:    baclofen  (LIORESAL ) 10 MG tablet, Take 1 tablet (10 mg total) by mouth at bedtime as needed for muscle spasms., Disp: 90 each, Rfl: 0   Bempedoic Acid-Ezetimibe  (NEXLIZET ) 180-10 MG TABS, Take 1 tablet by mouth daily at 12 noon. In place of pravastatin  and zetia , Disp: 90 tablet, Rfl: 1   celecoxib  (CELEBREX ) 100 MG capsule, Take 1 capsule (100 mg total) by mouth 2 (two) times daily., Disp: 180 capsule, Rfl: 0   Cetirizine HCl (ZYRTEC PO), Take 10 mg by mouth daily., Disp: , Rfl:    Cholecalciferol (VITAMIN D ) 2000 UNITS tablet, Take 1 tablet by mouth daily., Disp: , Rfl:    doxycycline  (VIBRA -TABS) 100 MG tablet, Take 1 tablet (100 mg total) by mouth 2 (two) times daily., Disp: 14 tablet, Rfl: 0   Evolocumab  (REPATHA  SURECLICK) 140 MG/ML SOAJ, Inject 140 mg into the skin every 14 (fourteen) days., Disp: 6 mL, Rfl: 3   FARXIGA  10 MG TABS tablet, Take 1 tablet (10 mg total) by mouth daily before breakfast., Disp: 90 tablet, Rfl: 1   fluconazole  (DIFLUCAN ) 150 MG tablet, Take 150 mg by mouth every other day., Disp: , Rfl:    fluticasone  (FLONASE ) 50 MCG/ACT nasal spray, Place 2 sprays into both nostrils daily., Disp: 16 g, Rfl: 5   ipratropium-albuterol  (DUONEB) 0.5-2.5 (3) MG/3ML SOLN, Take 3 mLs by nebulization every 4 (four) hours as needed., Disp: 120 mL, Rfl: 0   meclizine (ANTIVERT) 12.5 MG tablet,  Take 12.5-25 mg by mouth 3 (three) times daily as needed., Disp: , Rfl:    metoprolol  succinate (TOPROL -XL) 25 MG 24 hr tablet, Take 0.5 tablets (12.5 mg total) by mouth daily. Take with or immediately following a meal., Disp: 45 tablet, Rfl: 3   montelukast  (SINGULAIR ) 10 MG tablet, TAKE 1 TABLET (10 MG TOTAL) BY MOUTH AT BEDTIME. TAKE 1 TABLET BY MOUTH EVERYDAY AT BEDTIME, Disp: 90 tablet, Rfl: 1   pioglitazone  (ACTOS ) 15 MG tablet, TAKE 1 TABLET (15  MG TOTAL) BY MOUTH DAILY., Disp: 90 tablet, Rfl: 1   valsartan -hydrochlorothiazide  (DIOVAN -HCT) 160-12.5 MG tablet, TAKE 1 TABLET BY MOUTH EVERY DAY, Disp: 90 tablet, Rfl: 0   budesonide -formoterol  (SYMBICORT ) 160-4.5 MCG/ACT inhaler, Inhale 2 puffs into the lungs 2 (two) times daily. (Patient not taking: Reported on 03/09/2024), Disp: 3 each, Rfl: 1   Semaglutide , 1 MG/DOSE, 4 MG/3ML SOPN, Inject 1 mg as directed once a week. (Patient not taking: Reported on 03/09/2024), Disp: 9 mL, Rfl: 0 [2]  Allergies Allergen Reactions   Fish Allergy Shortness Of Breath   Penicillins Anaphylaxis and Rash    Childhood allergy   Crestor  [Rosuvastatin ]     Headache    Metformin  And Related Diarrhea   Adhesive [Tape] Rash    Paper tape ok to use.

## 2024-03-13 ENCOUNTER — Other Ambulatory Visit (HOSPITAL_COMMUNITY): Payer: Self-pay

## 2024-03-13 ENCOUNTER — Telehealth: Payer: Self-pay | Admitting: Pharmacy Technician

## 2024-03-13 NOTE — Telephone Encounter (Signed)
 Pharmacy Patient Advocate Encounter   Received notification from [employer] that prior authorization for Nexlizet  180-10MG  tablets is required/requested.   Insurance verification completed.   The patient is insured through [employer].   Per test claim: PA required; PA submitted to above mentioned insurance via Latent Key/confirmation #/EOC [id number] Status is pending

## 2024-03-16 ENCOUNTER — Other Ambulatory Visit (HOSPITAL_COMMUNITY): Payer: Self-pay

## 2024-03-18 ENCOUNTER — Other Ambulatory Visit (HOSPITAL_COMMUNITY): Payer: Self-pay

## 2024-03-18 NOTE — Telephone Encounter (Signed)
 Pharmacy Patient Advocate Encounter  Received notification from [employer] that Prior Authorization for Nexlizet  180-10MG  tablets has been APPROVED from 03/13/24 to 03/18/25. Unable to obtain price due to refill too soon rejection, last fill date 03/10/24 next available fill date03/21/26   PA #/Case ID/Reference #: [id number]

## 2024-04-02 ENCOUNTER — Other Ambulatory Visit (HOSPITAL_COMMUNITY): Payer: Self-pay

## 2024-04-03 ENCOUNTER — Other Ambulatory Visit (HOSPITAL_COMMUNITY): Payer: Self-pay

## 2024-04-10 ENCOUNTER — Encounter: Payer: Managed Care, Other (non HMO) | Admitting: Family Medicine

## 2024-04-22 ENCOUNTER — Encounter: Admitting: Family Medicine

## 2024-04-28 ENCOUNTER — Ambulatory Visit: Admitting: Medical

## 2024-06-08 ENCOUNTER — Ambulatory Visit: Admitting: Family Medicine
